# Patient Record
Sex: Female | Born: 1975
Health system: Southern US, Community
[De-identification: ages and names within clinical notes are randomized; demographics above are authoritative.]

## PROBLEM LIST (undated history)

## (undated) DIAGNOSIS — E119 Type 2 diabetes mellitus without complications: Secondary | ICD-10-CM

## (undated) DIAGNOSIS — N6320 Unspecified lump in the left breast, unspecified quadrant: Secondary | ICD-10-CM

## (undated) DIAGNOSIS — I10 Essential (primary) hypertension: Secondary | ICD-10-CM

## (undated) DIAGNOSIS — I4581 Long QT syndrome: Secondary | ICD-10-CM

## (undated) DIAGNOSIS — R42 Dizziness and giddiness: Secondary | ICD-10-CM

## (undated) DIAGNOSIS — K649 Unspecified hemorrhoids: Secondary | ICD-10-CM

## (undated) DIAGNOSIS — R002 Palpitations: Secondary | ICD-10-CM

## (undated) HISTORY — DX: Unspecified hemorrhoids: K64.9

## (undated) HISTORY — DX: Long QT syndrome: I45.81

## (undated) HISTORY — DX: Unspecified lump in the left breast, unspecified quadrant: N63.20

## (undated) HISTORY — DX: Palpitations: R00.2

---

## 2015-01-11 ENCOUNTER — Emergency Department
Admission: EM | Admit: 2015-01-11 | Discharge: 2015-01-11 | Disposition: A | Discharge status: Home / Self Care | Payer: Self-pay | Attending: Emergency Medicine | Admitting: Emergency Medicine

## 2015-01-11 ENCOUNTER — Emergency Department: Payer: Self-pay

## 2015-01-11 ENCOUNTER — Encounter: Payer: Self-pay | Admitting: *Deleted

## 2015-01-11 DIAGNOSIS — Z3202 Encounter for pregnancy test, result negative: Secondary | ICD-10-CM | POA: Insufficient documentation

## 2015-01-11 DIAGNOSIS — E119 Type 2 diabetes mellitus without complications: Secondary | ICD-10-CM | POA: Insufficient documentation

## 2015-01-11 DIAGNOSIS — R102 Pelvic and perineal pain: Secondary | ICD-10-CM

## 2015-01-11 DIAGNOSIS — N939 Abnormal uterine and vaginal bleeding, unspecified: Secondary | ICD-10-CM | POA: Insufficient documentation

## 2015-01-11 DIAGNOSIS — I10 Essential (primary) hypertension: Secondary | ICD-10-CM | POA: Insufficient documentation

## 2015-01-11 HISTORY — DX: Essential (primary) hypertension: I10

## 2015-01-11 HISTORY — DX: Type 2 diabetes mellitus without complications: E11.9

## 2015-01-11 LAB — CHLAMYDIA/NGC RT PCR (ARMC ONLY)
CHLAMYDIA TR: NOT DETECTED
N gonorrhoeae: NOT DETECTED

## 2015-01-11 LAB — CBC
HCT: 39.6 % (ref 35.0–47.0)
Hemoglobin: 13.1 g/dL (ref 12.0–16.0)
MCH: 28.4 pg (ref 26.0–34.0)
MCHC: 33.2 g/dL (ref 32.0–36.0)
MCV: 85.6 fL (ref 80.0–100.0)
PLATELETS: 369 10*3/uL (ref 150–440)
RBC: 4.62 MIL/uL (ref 3.80–5.20)
RDW: 14.3 % (ref 11.5–14.5)
WBC: 7 10*3/uL (ref 3.6–11.0)

## 2015-01-11 LAB — COMPREHENSIVE METABOLIC PANEL
ALBUMIN: 4 g/dL (ref 3.5–5.0)
ALT: 25 U/L (ref 14–54)
ANION GAP: 8 (ref 5–15)
AST: 22 U/L (ref 15–41)
Alkaline Phosphatase: 70 U/L (ref 38–126)
BUN: 9 mg/dL (ref 6–20)
CHLORIDE: 106 mmol/L (ref 101–111)
CO2: 21 mmol/L — ABNORMAL LOW (ref 22–32)
Calcium: 9 mg/dL (ref 8.9–10.3)
Creatinine, Ser: 0.77 mg/dL (ref 0.44–1.00)
GFR calc Af Amer: >60 mL/min (ref >60)
GLUCOSE: 328 mg/dL — AB (ref 65–99)
POTASSIUM: 3.5 mmol/L (ref 3.5–5.1)
Sodium: 135 mmol/L (ref 135–145)
TOTAL PROTEIN: 8.1 g/dL (ref 6.5–8.1)
Total Bilirubin: 0.3 mg/dL (ref 0.3–1.2)

## 2015-01-11 LAB — WET PREP, GENITAL
CLUE CELLS WET PREP: NONE SEEN
TRICH WET PREP: NONE SEEN
Yeast Wet Prep HPF POC: NONE SEEN

## 2015-01-11 LAB — GLUCOSE, CAPILLARY
GLUCOSE-CAPILLARY: 321 mg/dL — AB (ref 65–99)
Glucose-Capillary: 238 mg/dL — ABNORMAL HIGH (ref 65–99)

## 2015-01-11 LAB — LIPASE, BLOOD: LIPASE: 31 U/L (ref 11–51)

## 2015-01-11 LAB — HCG, QUANTITATIVE, PREGNANCY: HCG, BETA CHAIN, QUANT, S: 1 m[IU]/mL (ref <5)

## 2015-01-11 LAB — HEMOGLOBIN AND HEMATOCRIT, BLOOD
HCT: 39.1 % (ref 35.0–47.0)
HEMOGLOBIN: 13.2 g/dL (ref 12.0–16.0)

## 2015-01-11 LAB — ABO/RH: ABO/RH(D): O POS

## 2015-01-11 MED ORDER — METFORMIN HCL 500 MG PO TABS: 500.0000 mg | ORAL_TABLET | Freq: Two times a day (BID) | ORAL | Status: DC

## 2015-01-11 MED ORDER — METFORMIN HCL 500 MG PO TABS
500.0000 mg | ORAL_TABLET | ORAL | Status: AC
  Administered 2015-01-11: 500 mg via ORAL
  Filled 2015-01-11: qty 1

## 2015-01-11 MED ORDER — MORPHINE SULFATE (PF) 4 MG/ML IV SOLN
4.0000 mg | Freq: Once | INTRAVENOUS | Status: AC
  Administered 2015-01-11: 4 mg via INTRAVENOUS
  Filled 2015-01-11: qty 1

## 2015-01-11 MED ORDER — PROMETHAZINE HCL 25 MG/ML IJ SOLN
12.5000 mg | Freq: Once | INTRAMUSCULAR | Status: AC
  Administered 2015-01-11: 12.5 mg via INTRAVENOUS
  Filled 2015-01-11: qty 1

## 2015-01-11 MED ORDER — SODIUM CHLORIDE 0.9 % IV BOLUS (SEPSIS)
1000.0000 mL | Freq: Once | INTRAVENOUS | Status: AC
  Administered 2015-01-11: 1000 mL via INTRAVENOUS

## 2015-01-11 NOTE — ED Notes (Signed)
Pt reports being " a few" weeks pregnant, pt complains of left lower abdominal pain with vaginal bleeding starting yesterday

## 2015-01-11 NOTE — Discharge Instructions (Signed)
Please follow up closely with obstetrics and gynecology. Again, you're ultrasound and blood tests do not indicate pregnancy today. Return to emergency room right away if he develop a fever, severe recurrence of pain, or vomiting, have heavy bleeding, lightheadedness, weakness, or other new concerns or symptoms arise.  Please restart her metformin as prescribed, make sure the chair checking her blood sugars regularly throughout the day with each meal. He is followed closely with a primary care provider in the area to establish care for your diabetes.

## 2015-01-11 NOTE — ED Provider Notes (Signed)
[facility] Provider Note REMINDER - THIS NOTE IS NOT A FINAL MEDICAL RECORD UNTIL IT IS SIGNED. UNTIL THEN, THE CONTENT BELOW MAY REFLECT INFORMATION FROM A DOCUMENTATION TEMPLATE, NOT THE ACTUAL PATIENT VISIT. ____________________________________________  Time seen: Approximately 11:31 AM  I have reviewed the triage vital signs and the nursing notes.   HISTORY  Chief Complaint Vaginal Bleeding    HPI Tonya Paul is a 39 y.o. female who reports that her last period was the third week of August in the last month she had a positive pregnancy test. She reports [place] develop achy pain in the left lower abdomen, and this worsens throughout the last 2 days and now she still reports that yesterday she started having vaginal discharge which was bleeding. She reports she is having bleeding that was about that of a period yesterday and today now she is having increased amounts of bleeding. She denies any lightheadedness, trouble breathing, chest pain, fevers. She does have some mild nausea. She reports that she has not been on metformin for about 2 months that she has "diet-controlled" diabetes.  She states her 2 previous pregnancies both which were normal deliveries. She denies any fertility treatments. No history of ectopic.   Past Medical History  Diagnosis Date  . Diabetes mellitus without complication ([place])   . Hypertension     There are no active problems to display for this patient.   History reviewed. No pertinent past surgical history.  Current Outpatient Rx  Name  Route  Sig  Dispense  Refill  . metFORMIN (GLUCOPHAGE) 500 MG tablet   Oral   Take 1 tablet (500 mg total) by mouth 2 (two) times daily with a meal.   60 tablet   0     Allergies Aspirin  No family history on file.  Social History Social History  Substance Use Topics  . Smoking status: Never Smoker   . Smokeless tobacco:  None  . Alcohol Use: No    Review of Systems Constitutional: No fever/chills Eyes: No visual changes. ENT: No sore throat. Cardiovascular: Denies chest pain. Respiratory: Denies shortness of breath. Gastrointestinal: Left lower abdomen/pelvic pain.  no vomiting.  No diarrhea.  No constipation. Genitourinary: Negative for dysuria. Patient does report vaginal bleeding. No pain or burning with urination. Musculoskeletal: Negative for back pain. Skin: Negative for rash. Neurological: Negative for headaches, focal weakness or numbness.  10-point ROS otherwise negative.  ____________________________________________   PHYSICAL EXAM:  VITAL SIGNS: ED Triage Vitals  Enc Vitals Group     BP 01/11/15 1043 143/93 mmHg     Pulse Rate 01/11/15 1043 93     Resp 01/11/15 1043 16     Temp 01/11/15 1043 98.2 F (36.8 C)     Temp Source 01/11/15 1043 Oral     SpO2 01/11/15 1043 98 %     Weight 01/11/15 1043 200 lb (90.719 kg)     Height 01/11/15 1043 5\' 11"  (1.803 m)     Head Cir --      Peak Flow --      Pain Score 01/11/15 1046 10     Pain Loc --      Pain Edu? --      Excl. in [place]? --    Constitutional: Alert and oriented. Patient seen on the side of the bed, states she is unable to comfortable and does appear to be in moderate left-sided pain holding her  arm over her left lower back and pelvis. Eyes: Conjunctivae are normal. PERRL. EOMI. Head: Atraumatic. Nose: No congestion/rhinnorhea. Mouth/Throat: Mucous membranes are moist.  Oropharynx non-erythematous. Neck: No stridor.   Cardiovascular: Normal rate, regular rhythm. Grossly normal heart sounds.  Good peripheral circulation. Respiratory: Normal respiratory effort.  No retractions. Lungs CTAB. Gastrointestinal: Soft and nontender to prolong the left lower pelvis and left flank without rebound or guarding. No distention. No abdominal bruits. No CVA tenderness. Patient's abdomen is not obviously gravid that she is somewhat  overweight. Musculoskeletal: No lower extremity tenderness nor edema.  No joint effusions. Neurologic:  Normal speech and language. No gross focal neurologic deficits are appreciated. Skin:  Skin is warm, dry and intact. No rash noted. Psychiatric: Mood and affect are normal. Speech and behavior are normal.  ____________________________________________   LABS (all labs ordered are listed, but only abnormal results are displayed)  Labs Reviewed  WET PREP, GENITAL - Abnormal; Notable for the following:    WBC, Wet Prep HPF POC FEW (*)    All other components within normal limits  GLUCOSE, CAPILLARY - Abnormal; Notable for the following:    Glucose-Capillary 321 (*)    All other components within normal limits  COMPREHENSIVE METABOLIC PANEL - Abnormal; Notable for the following:    CO2 21 (*)    Glucose, Bld 328 (*)    All other components within normal limits  GLUCOSE, CAPILLARY - Abnormal; Notable for the following:    Glucose-Capillary 238 (*)    All other components within normal limits  CHLAMYDIA/NGC RT PCR ([facility] ONLY)  HCG, QUANTITATIVE, PREGNANCY  HEMOGLOBIN AND HEMATOCRIT, BLOOD  CBC  LIPASE, BLOOD  CBG MONITORING, ED  CBG MONITORING, ED  ABO/RH   ____________________________________________  EKG   ____________________________________________  RADIOLOGY  [place] Art/Ven Flow Abd Pelv Doppler (Final result) Result time: 01/11/15 13:31:21   Final result by Rad Results In Interface (01/11/15 13:31:21)   Narrative:   CLINICAL DATA: Acute left pelvic and flank pain, vaginal bleeding for 2 days.  EXAM: TRANSABDOMINAL AND TRANSVAGINAL ULTRASOUND OF PELVIS  DOPPLER ULTRASOUND OF OVARIES  TECHNIQUE: Both transabdominal and transvaginal ultrasound examinations of the pelvis were performed. Transabdominal technique was performed for global imaging of the pelvis including uterus, ovaries, adnexal regions, and pelvic cul-de-sac.  It was necessary to proceed  with endovaginal exam following the transabdominal exam to visualize the uterus, endometrium and adnexa in better detail. Color and duplex Doppler ultrasound was utilized to evaluate blood flow to the ovaries.  COMPARISON: None.  FINDINGS: Uterus  Measurements: 9.7 x 5.8 x 5.4 cm. No fibroids or other mass visualized.  Endometrium  Thickness: 8 mm. No focal abnormality visualized.  Right ovary  Measurements: 2.7 x 1.9 x 2.6 cm. Normal appearance/no adnexal mass.  Left ovary  Measurements: 2.6 x 1.9 x 2.1 cm. Normal appearance/no adnexal mass.  Pulsed Doppler evaluation of both ovaries demonstrates normal low-resistance arterial and venous waveforms.  Other findings  Trace pelvic free fluid in the right adnexa.  IMPRESSION: No acute or significant finding by pelvic ultrasound.   Electronically Signed By: Jerilynn Mages. Shick M.D. On: 01/11/2015 13:31          US Pelvis Complete (Final result) Result time: 01/11/15 13:31:21   Procedure changed from US OB Comp Less 14 Wks      Final result by Rad Results In Interface (01/11/15 13:31:21)   Narrative:   CLINICAL DATA: Acute left pelvic and flank pain, vaginal bleeding for 2 days.  EXAM: TRANSABDOMINAL  AND TRANSVAGINAL ULTRASOUND OF PELVIS  DOPPLER ULTRASOUND OF OVARIES  TECHNIQUE: Both transabdominal and transvaginal ultrasound examinations of the pelvis were performed. Transabdominal technique was performed for global imaging of the pelvis including uterus, ovaries, adnexal regions, and pelvic cul-de-sac.  It was necessary to proceed with endovaginal exam following the transabdominal exam to visualize the uterus, endometrium and adnexa in better detail. Color and duplex Doppler ultrasound was utilized to evaluate blood flow to the ovaries.  COMPARISON: None.  FINDINGS: Uterus  Measurements: 9.7 x 5.8 x 5.4 cm. No fibroids or other mass visualized.  Endometrium  Thickness: 8 mm. No focal  abnormality visualized.  Right ovary  Measurements: 2.7 x 1.9 x 2.6 cm. Normal appearance/no adnexal mass.  Left ovary  Measurements: 2.6 x 1.9 x 2.1 cm. Normal appearance/no adnexal mass.  Pulsed Doppler evaluation of both ovaries demonstrates normal low-resistance arterial and venous waveforms.  Other findings  Trace pelvic free fluid in the right adnexa.  IMPRESSION: No acute or significant finding by pelvic ultrasound.   Electronically Signed By: Jerilynn Mages. Shick M.D. On: 01/11/2015 13:31          US Transvaginal Non-OB (Final result) Result time: 01/11/15 13:31:21   Procedure changed from US OB Transvaginal      Final result by Rad Results In Interface (01/11/15 13:31:21)   Narrative:   CLINICAL DATA: Acute left pelvic and flank pain, vaginal bleeding for 2 days.  EXAM: TRANSABDOMINAL AND TRANSVAGINAL ULTRASOUND OF PELVIS  DOPPLER ULTRASOUND OF OVARIES  TECHNIQUE: Both transabdominal and transvaginal ultrasound examinations of the pelvis were performed. Transabdominal technique was performed for global imaging of the pelvis including uterus, ovaries, adnexal regions, and pelvic cul-de-sac.  It was necessary to proceed with endovaginal exam following the transabdominal exam to visualize the uterus, endometrium and adnexa in better detail. Color and duplex Doppler ultrasound was utilized to evaluate blood flow to the ovaries.  COMPARISON: None.  FINDINGS: Uterus  Measurements: 9.7 x 5.8 x 5.4 cm. No fibroids or other mass visualized.  Endometrium  Thickness: 8 mm. No focal abnormality visualized.  Right ovary  Measurements: 2.7 x 1.9 x 2.6 cm. Normal appearance/no adnexal mass.  Left ovary  Measurements: 2.6 x 1.9 x 2.1 cm. Normal appearance/no adnexal mass.  Pulsed Doppler evaluation of both ovaries demonstrates normal low-resistance arterial and venous waveforms.  Other findings  Trace pelvic free fluid in the right  adnexa.  IMPRESSION: No acute or significant finding by pelvic ultrasound.      ____________________________________________   PROCEDURES  Procedure(s) performed: None  Critical Care performed: No  ____________________________________________   INITIAL IMPRESSION / ASSESSMENT AND PLAN / ED COURSE  Pertinent labs & imaging results that were available during my care of the patient were reviewed by me and considered in my medical decision making (see chart for details).  Patient presents with vaginal bleeding and 3 days of left lower flank/pelvic pain. She reports she is pregnant. My primary concern given the amount of pain and bleeding that she is and would be ruling out ectopic, however other considerations are considered such as kidney stone, miscarriage, and for less likely would be left-sided appendicitis, other acute intra-abdominal surgical pathology or infection. She has no fever and she denies any infectious symptoms, primary concern is left-sided pelvic pain and bleeding vaginally. She does report she had a kidney stone which is about 39 years old, and this could also cause similar discomfort but we will initially start by ruling out ectopic. Transvaginal ultrasound. Discussed the risks  and benefits of pain medications and antiemetics with morphine and Phenergan, given the patient's significant discomfort and she does consent to their use after discussion I think this is very agreeable.  ----------------------------------------- 12:26 PM on 01/11/2015 -----------------------------------------  Patient resting much more comfortably, currently her pain is only a 1 out of 10 still located in the left lower quadrant. Her hCG is less than 5 making current pregnancy extremity unlikely, and I suspect there is a possibility she may miscarriage or potentially has retained product or less likely an ectopic. We will continue with pelvic ultrasound. Clinically she appears much  improved.  ----------------------------------------- 2:08 PM on 01/11/2015 -----------------------------------------  Reviewed ultrasound, no acute. Patient resting comfortably but does still have minor focal tenderness the left lower quadrant. We'll perform a pelvic exam, discussed with the patient is agreeable. ----------------------------------------- 2:19 PM on 01/11/2015 -----------------------------------------  Pelvic exam performed with tech Di Kindle. The patient does have small amount of clots in the vaginal vault, very minimal tenderness along the left adnexa without any cervical motion tenderness. No vaginal discharge or purulence. No abnormal odor. There is no obvious mass. The cervix is closed.  Case and care discussed with Dr. Etta Grandchild of OB/GYN and she advises close outpatient follow-up, the based on ultrasound and hCG testing it would appear that the patient is likely returning to her menstrual cycle or potentially had a chemical pregnancy or has a completed miscarriage. I discussed this with the patient, we will have her follow-up closely with OB. At this point there is nothing indicates she is pregnant, I will discharge her to home and provide her prescription to return back on her metformin. Patient very agreeable. We did discuss that a CT could be performed to further evaluate, but given the vaginal bleeding and her chief complaint. This likely GYN in nature. Currently no peritonitis or evidence to suggest the need for CT imaging. Should she develop severe abdominal pain, fever, vomiting, weakness, or severe bleeding then she will return to emergency room right away. ____________________________________________   FINAL CLINICAL IMPRESSION(S) / ED DIAGNOSES  Final diagnoses:  Vagina bleeding      Delman Kitten, MD 01/11/15 1450

## 2015-03-20 ENCOUNTER — Emergency Department
Admission: EM | Admit: 2015-03-20 | Discharge: 2015-03-20 | Disposition: A | Discharge status: Home / Self Care | Payer: Self-pay | Attending: Emergency Medicine | Admitting: Emergency Medicine

## 2015-03-20 DIAGNOSIS — Z3202 Encounter for pregnancy test, result negative: Secondary | ICD-10-CM | POA: Insufficient documentation

## 2015-03-20 DIAGNOSIS — R739 Hyperglycemia, unspecified: Secondary | ICD-10-CM

## 2015-03-20 DIAGNOSIS — I10 Essential (primary) hypertension: Secondary | ICD-10-CM | POA: Insufficient documentation

## 2015-03-20 DIAGNOSIS — N390 Urinary tract infection, site not specified: Secondary | ICD-10-CM

## 2015-03-20 DIAGNOSIS — Z760 Encounter for issue of repeat prescription: Secondary | ICD-10-CM | POA: Insufficient documentation

## 2015-03-20 DIAGNOSIS — E1165 Type 2 diabetes mellitus with hyperglycemia: Secondary | ICD-10-CM | POA: Insufficient documentation

## 2015-03-20 LAB — BASIC METABOLIC PANEL
ANION GAP: 6 (ref 5–15)
BUN: 12 mg/dL (ref 6–20)
CHLORIDE: 106 mmol/L (ref 101–111)
CO2: 24 mmol/L (ref 22–32)
Calcium: 9.3 mg/dL (ref 8.9–10.3)
Creatinine, Ser: 0.73 mg/dL (ref 0.44–1.00)
GFR calc Af Amer: >60 mL/min (ref >60)
GFR calc non Af Amer: >60 mL/min (ref >60)
Glucose, Bld: 187 mg/dL — ABNORMAL HIGH (ref 65–99)
POTASSIUM: 3.8 mmol/L (ref 3.5–5.1)
SODIUM: 136 mmol/L (ref 135–145)

## 2015-03-20 LAB — CBC
HEMATOCRIT: 38.1 % (ref 35.0–47.0)
HEMOGLOBIN: 12.6 g/dL (ref 12.0–16.0)
MCH: 28.6 pg (ref 26.0–34.0)
MCHC: 33.1 g/dL (ref 32.0–36.0)
MCV: 86.4 fL (ref 80.0–100.0)
Platelets: 344 10*3/uL (ref 150–440)
RBC: 4.41 MIL/uL (ref 3.80–5.20)
RDW: 13.8 % (ref 11.5–14.5)
WBC: 7.4 10*3/uL (ref 3.6–11.0)

## 2015-03-20 LAB — URINALYSIS COMPLETE WITH MICROSCOPIC (ARMC ONLY)
Bilirubin Urine: NEGATIVE
Glucose, UA: NEGATIVE mg/dL
HGB URINE DIPSTICK: NEGATIVE
Ketones, ur: NEGATIVE mg/dL
NITRITE: NEGATIVE
PH: 6 (ref 5.0–8.0)
PROTEIN: NEGATIVE mg/dL
SPECIFIC GRAVITY, URINE: 1.017 (ref 1.005–1.030)

## 2015-03-20 LAB — GLUCOSE, CAPILLARY: Glucose-Capillary: 164 mg/dL — ABNORMAL HIGH (ref 65–99)

## 2015-03-20 LAB — POCT PREGNANCY, URINE: Preg Test, Ur: NEGATIVE

## 2015-03-20 MED ORDER — CIPROFLOXACIN HCL 500 MG PO TABS: 500.0000 mg | ORAL_TABLET | Freq: Two times a day (BID) | ORAL | Status: AC

## 2015-03-20 MED ORDER — METFORMIN HCL 500 MG PO TABS: 500.0000 mg | ORAL_TABLET | Freq: Two times a day (BID) | ORAL | Status: DC

## 2015-03-20 NOTE — Discharge Instructions (Signed)
Hyperglycemia °Hyperglycemia occurs when the glucose (sugar) in your blood is too high. Hyperglycemia can happen for many reasons, but it most often happens to people who do not know they have diabetes or are not managing their diabetes properly.  °CAUSES  °Whether you have diabetes or not, there are other causes of hyperglycemia. Hyperglycemia can occur when you have diabetes, but it can also occur in other situations that you might not be as aware of, such as: °Diabetes °· If you have diabetes and are having problems controlling your blood glucose, hyperglycemia could occur because of some of the following reasons: °· Not following your meal plan. °· Not taking your diabetes medications or not taking it properly. °· Exercising less or doing less activity than you normally do. °· Being sick. °Pre-diabetes °· This cannot be ignored. Before people develop Type 2 diabetes, they almost always have "pre-diabetes." This is when your blood glucose levels are higher than normal, but not yet high enough to be diagnosed as diabetes. Research has shown that some long-term damage to the body, especially the heart and circulatory system, may already be occurring during pre-diabetes. If you take action to manage your blood glucose when you have pre-diabetes, you may delay or prevent Type 2 diabetes from developing. °Stress °· If you have diabetes, you may be "diet" controlled or on oral medications or insulin to control your diabetes. However, you may find that your blood glucose is higher than usual in the hospital whether you have diabetes or not. This is often referred to as "stress hyperglycemia." Stress can elevate your blood glucose. This happens because of hormones put out by the body during times of stress. If stress has been the cause of your high blood glucose, it can be followed regularly by your caregiver. That way he/she can make sure your hyperglycemia does not continue to get worse or progress to  diabetes. °Steroids °· Steroids are medications that act on the infection fighting system (immune system) to block inflammation or infection. One side effect can be a rise in blood glucose. Most people can produce enough extra insulin to allow for this rise, but for those who cannot, steroids make blood glucose levels go even higher. It is not unusual for steroid treatments to "uncover" diabetes that is developing. It is not always possible to determine if the hyperglycemia will go away after the steroids are stopped. A special blood test called an A1c is sometimes done to determine if your blood glucose was elevated before the steroids were started. °SYMPTOMS °· Thirsty. °· Frequent urination. °· Dry mouth. °· Blurred vision. °· Tired or fatigue. °· Weakness. °· Sleepy. °· Tingling in feet or leg. °DIAGNOSIS  °Diagnosis is made by monitoring blood glucose in one or all of the following ways: °· A1c test. This is a chemical found in your blood. °· Fingerstick blood glucose monitoring. °· Laboratory results. °TREATMENT  °First, knowing the cause of the hyperglycemia is important before the hyperglycemia can be treated. Treatment may include, but is not be limited to: °· Education. °· Change or adjustment in medications. °· Change or adjustment in meal plan. °· Treatment for an illness, infection, etc. °· More frequent blood glucose monitoring. °· Change in exercise plan. °· Decreasing or stopping steroids. °· Lifestyle changes. °HOME CARE INSTRUCTIONS  °· Test your blood glucose as directed. °· Exercise regularly. Your caregiver will give you instructions about exercise. Pre-diabetes or diabetes which comes on with stress is helped by exercising. °· Eat wholesome,   balanced meals. Eat often and at regular, fixed times. Your caregiver or nutritionist will give you a meal plan to guide your sugar intake.  Being at an ideal weight is important. If needed, losing as little as 10 to 15 pounds may help improve blood  glucose levels. SEEK MEDICAL CARE IF:   You have questions about medicine, activity, or diet.  You continue to have symptoms (problems such as increased thirst, urination, or weight gain). SEEK IMMEDIATE MEDICAL CARE IF:   You are vomiting or have diarrhea.  Your breath smells fruity.  You are breathing faster or slower.  You are very sleepy or incoherent.  You have numbness, tingling, or pain in your feet or hands.  You have chest pain.  Your symptoms get worse even though you have been following your caregiver's orders.  If you have any other questions or concerns.   This information is not intended to replace advice given to you by your health care provider. Make sure you discuss any questions you have with your health care provider.   Document Released: 09/01/2000 Document Revised: 05/31/2011 Document Reviewed: 11/12/2014 Elsevier Interactive Patient Education 2016 [employer]  Urinary Tract Infection A urinary tract infection (UTI) can occur any place along the urinary tract. The tract includes the kidneys, ureters, bladder, and urethra. A type of germ called bacteria often causes a UTI. UTIs are often helped with antibiotic medicine.  HOME CARE   If given, take antibiotics as told by your doctor. Finish them even if you start to feel better.  Drink enough fluids to keep your pee (urine) clear or pale yellow.  Avoid tea, drinks with caffeine, and bubbly (carbonated) drinks.  Pee often. Avoid holding your pee in for a long time.  Pee before and after having sex (intercourse).  Wipe from front to back after you poop (bowel movement) if you are a woman. Use each tissue only once. GET HELP RIGHT AWAY IF:   You have back pain.  You have lower belly (abdominal) pain.  You have chills.  You feel sick to your stomach (nauseous).  You throw up (vomit).  Your burning or discomfort with peeing does not go away.  You have a fever.  Your symptoms are not better  in 3 days. MAKE SURE YOU:   Understand these instructions.  Will watch your condition.  Will get help right away if you are not doing well or get worse.   This information is not intended to replace advice given to you by your health care provider. Make sure you discuss any questions you have with your health care provider.   Document Released: 08/25/2007 Document Revised: 03/29/2014 Document Reviewed: 10/07/2011 Elsevier Interactive Patient Education [employer].

## 2015-03-20 NOTE — ED Notes (Signed)
Says she has recently moved here and is out of metformin.  Has been controlling with diet. Says she checks her sugar and it has been up this week.  Pt given information on [facility] and [facility].

## 2015-03-20 NOTE — ED Notes (Signed)
Pt states she has been out of her metformin for the past month and her glucose has been in the 300's the past couple of days.

## 2015-03-20 NOTE — ED Provider Notes (Signed)
[facility] Emergency Department Provider Note  Time seen: 1:59 PM  I have reviewed the triage vital signs and the nursing notes.   HISTORY  Chief Complaint Hyperglycemia    HPI Tonya Paul is a 39 y.o. female with a past medical history of hypertension and diabetes who presents the emergency department for an elevated blood glucose. According to the patient she has been out of her metformin for 3 weeks after recently moving to the area. She has no primary care doctor established yet and no way to obtain a refill of her medications. She took her blood glucose of the last few days of this run in the 300s so she came to the emergency Department today looking for help. Denies any abdominal pain, nausea, vomiting, diarrhea. She did note some cloudy urine and upon further questioning she states she might have had some mild dysuria. Describes her dysuria is very mild.     Past Medical History  Diagnosis Date  . Diabetes mellitus without complication ([place])   . Hypertension     There are no active problems to display for this patient.   History reviewed. No pertinent past surgical history.  Current Outpatient Rx  Name  Route  Sig  Dispense  Refill  . metFORMIN (GLUCOPHAGE) 500 MG tablet   Oral   Take 1 tablet (500 mg total) by mouth 2 (two) times daily with a meal.   60 tablet   0     Allergies Aspirin  No family history on file.  Social History Social History  Substance Use Topics  . Smoking status: Never Smoker   . Smokeless tobacco: None  . Alcohol Use: No    Review of Systems Constitutional: Negative for fever Cardiovascular: Negative for chest pain. Respiratory: Negative for shortness of breath. Gastrointestinal: Negative for abdominal pain, vomiting and diarrhea. Genitourinary: Mild dysuria Musculoskeletal: Negative for back pain. Neurological: Negative for headache 10-point ROS otherwise  negative.  ____________________________________________   PHYSICAL EXAM:  VITAL SIGNS: ED Triage Vitals  Enc Vitals Group     BP 03/20/15 1157 149/85 mmHg     Pulse Rate 03/20/15 1157 94     Resp 03/20/15 1157 18     Temp 03/20/15 1157 98.2 F (36.8 C)     Temp Source 03/20/15 1157 Oral     SpO2 03/20/15 1157 99 %     Weight 03/20/15 1157 185 lb (83.915 kg)     Height 03/20/15 1157 6' (1.829 m)     Head Cir --      Peak Flow --      Pain Score --      Pain Loc --      Pain Edu? --      Excl. in [place]? --     Constitutional: Alert and oriented. Well appearing and in no distress. Eyes: Normal exam ENT   Head: Normocephalic and atraumatic   Mouth/Throat: Mucous membranes are moist. Cardiovascular: Normal rate, regular rhythm. No murmur Respiratory: Normal respiratory effort without tachypnea nor retractions. Breath sounds are clear Gastrointestinal: Soft and nontender. No distention.   Musculoskeletal: Nontender with normal range of motion in all extremities.  Neurologic:  Normal speech and language. No gross focal neurologic deficits  Skin:  Skin is warm, dry and intact.  Psychiatric: Mood and affect are normal. Speech and behavior are normal  ____________________________________________     INITIAL IMPRESSION / ASSESSMENT AND PLAN / ED COURSE  Pertinent labs & imaging results that were available  during my care of the patient were reviewed by me and considered in my medical decision making (see chart for details).  Patient presents to the emergency department with elevated blood glucose after being out of her metformin for the past 3 weeks. Patient typically takes 1000 mg in the morning and in the evening. She is also complaining of some cloudy urine, with mild dysuria. Urinalysis shows 6-30 white blood cells in her urine, we will treat with ciprofloxacin. I will prescribe the patient's metformin, and her labs today are largely within normal limits with a blood  glucose of 180. We have provided the patient information for the outpatient clinic resources in the area, the patient is agreeable and currently waiting for her insurance to kick in.  ____________________________________________   FINAL CLINICAL IMPRESSION(S) / ED DIAGNOSES  Urinary tract infection Hyperglycemia Medication refill   Harvest Dark, MD 03/20/15 1402

## 2015-04-08 ENCOUNTER — Ambulatory Visit: Payer: Self-pay

## 2015-04-08 ENCOUNTER — Ambulatory Visit: Payer: Self-pay | Admitting: Family Medicine

## 2015-04-16 ENCOUNTER — Ambulatory Visit: Payer: Self-pay | Attending: Family Medicine | Admitting: Family Medicine

## 2015-04-16 ENCOUNTER — Encounter: Payer: Self-pay | Admitting: Family Medicine

## 2015-04-16 VITALS — BP 140/95 | HR 90 | Temp 98.8°F | Resp 16 | Ht 72.0 in | Wt 206.0 lb

## 2015-04-16 DIAGNOSIS — Z794 Long term (current) use of insulin: Secondary | ICD-10-CM | POA: Insufficient documentation

## 2015-04-16 DIAGNOSIS — E1165 Type 2 diabetes mellitus with hyperglycemia: Secondary | ICD-10-CM | POA: Insufficient documentation

## 2015-04-16 DIAGNOSIS — I10 Essential (primary) hypertension: Secondary | ICD-10-CM | POA: Insufficient documentation

## 2015-04-16 DIAGNOSIS — Z7984 Long term (current) use of oral hypoglycemic drugs: Secondary | ICD-10-CM | POA: Insufficient documentation

## 2015-04-16 LAB — GLUCOSE, POCT (MANUAL RESULT ENTRY): POC GLUCOSE: 100 mg/dL — AB (ref 70–99)

## 2015-04-16 LAB — POCT GLYCOSYLATED HEMOGLOBIN (HGB A1C): Hemoglobin A1C: 7.5

## 2015-04-16 MED ORDER — TRUE METRIX METER DEVI: 1.0000 | Freq: Three times a day (TID) | Status: DC

## 2015-04-16 MED ORDER — TRUEPLUS LANCETS 28G MISC: 1.0000 | Freq: Three times a day (TID) | Status: DC

## 2015-04-16 MED ORDER — LISINOPRIL 5 MG PO TABS: 5.0000 mg | ORAL_TABLET | Freq: Every day | ORAL | Status: DC

## 2015-04-16 MED ORDER — GLUCOSE BLOOD VI STRP: ORAL_STRIP | Status: DC

## 2015-04-16 MED FILL — LISINOPRIL 5 MG TABLET: 5 | 30 days supply | Qty: 30 | Fill #0

## 2015-04-16 MED FILL — TRUE METRIX TEST STRIP: 33 days supply | Qty: 100 | Fill #0

## 2015-04-16 MED FILL — TRUEplus LANCETS 28G MISC: 33 days supply | Qty: 100 | Fill #0

## 2015-04-16 MED FILL — !TRUE METRIX BLOOD GLUCOSE: 365 days supply | Qty: 1 | Fill #0

## 2015-04-16 NOTE — Progress Notes (Signed)
Subjective:  Patient ID: Tonya Paul, female    DOB: 1975-04-29  Age: 40 y.o. MRN: [id number]  CC: Diabetes   HPI Tonya Paul is a 40 year old with a history of type 2 diabetes mellitus (A1c 7.5) who was diagnosed with diabetes mellitus in 2014 and had been on metformin intermittently and diet controlled until she presented to the ED at [facility] with a blood sugar and 300s for which her metformin was restarted. She was also treated for UTI with ciprofloxacin but she states after taking one tablet she developed pruritus and so she stopped it; urinary symptoms have since resolved.  She has been taking metformin once daily rather than twice daily because she has had episodes of hypoglycemia which sugars dropping into the 60s. She has no other complaints today  Outpatient Prescriptions Prior to Visit  Medication Sig Dispense Refill  . metFORMIN (GLUCOPHAGE) 500 MG tablet Take 1 tablet (500 mg total) by mouth 2 (two) times daily with a meal. 60 tablet 1   No facility-administered medications prior to visit.    ROS Review of Systems  Constitutional: Negative for activity change and appetite change.  HENT: Negative for sinus pressure and sore throat.   Respiratory: Negative for chest tightness, shortness of breath and wheezing.   Cardiovascular: Negative for chest pain and palpitations.  Gastrointestinal: Negative for abdominal pain, constipation and abdominal distention.  Genitourinary: Negative.   Musculoskeletal: Negative.   Psychiatric/Behavioral: Negative for behavioral problems and dysphoric mood.    Objective:  BP 140/95 mmHg  Pulse 90  Temp(Src) 98.8 F (37.1 C)  Resp 16  Ht 6' (1.829 m)  Wt 206 lb (93.441 kg)  BMI 27.93 kg/m2  SpO2 98%  LMP 03/10/2015  BP/Weight 04/16/2015 03/20/2015 [id number]  Systolic BP [id number] [id number] [id number]  Diastolic BP 95 85 96  Wt. (Lbs) 206 185 200  BMI 27.93 25.08 27.91    Lab Results  Component Value Date   HGBA1C  7.50 04/16/2015     Physical Exam  Constitutional: She is oriented to person, place, and time. She appears well-developed and well-nourished.  Cardiovascular: Normal rate, normal heart sounds and intact distal pulses.   No murmur heard. Pulmonary/Chest: Effort normal and breath sounds normal. She has no wheezes. She has no rales. She exhibits no tenderness.  Abdominal: Soft. Bowel sounds are normal. She exhibits no distension and no mass. There is no tenderness.  Musculoskeletal: Normal range of motion.  Neurological: She is alert and oriented to person, place, and time.     Assessment & Plan:   1. Type 2 diabetes mellitus with hyperglycemia, without long-term current use of insulin (HCC)  A1c of 7.5, CBG of 100  takes metformin daily due to hypoglycemia.  we'll review blood sugar log that her next office visit.  foot exam, Pneumovax will be performed at her next visit and she has been advised to schedule annual eye exams with an optometrist or ophthalmologist. - Glucose (CBG) - HgB A1c - COMPLETE METABOLIC PANEL WITH GFR; Future - Lipid panel; Future - glucose blood (TRUE METRIX BLOOD GLUCOSE TEST) test strip; Used 3 times daily as directed before meals  Dispense: 100 each; Refill: 12 - Blood Glucose Monitoring Suppl (TRUE METRIX METER) DEVI; 1 each by Does not apply route 3 (three) times daily before meals.  Dispense: 1 Device; Refill: 0 - TRUEPLUS LANCETS 28G MISC; 1 each by Does not apply route 3 (three) times daily before meals.  Dispense: 100 each; Refill: 12 -  Microalbumin/Creatinine Ratio, Urine; Future  2. Essential hypertension  diastolic elevation. Commenced on lisinopril. - lisinopril (PRINIVIL,ZESTRIL) 5 MG tablet; Take 1 tablet (5 mg total) by mouth daily.  Dispense: 90 tablet; Refill: 3   Meds ordered this encounter  Medications  . glucose blood (TRUE METRIX BLOOD GLUCOSE TEST) test strip    Sig: Used 3 times daily as directed before meals    Dispense:  100  each    Refill:  12  . Blood Glucose Monitoring Suppl (TRUE METRIX METER) DEVI    Sig: 1 each by Does not apply route 3 (three) times daily before meals.    Dispense:  1 Device    Refill:  0  . TRUEPLUS LANCETS 28G MISC    Sig: 1 each by Does not apply route 3 (three) times daily before meals.    Dispense:  100 each    Refill:  12  . lisinopril (PRINIVIL,ZESTRIL) 5 MG tablet    Sig: Take 1 tablet (5 mg total) by mouth daily.    Dispense:  90 tablet    Refill:  3    Follow-up: Return in about 3 weeks (around 05/07/2015) for  follow-up of diabetes mellitus..   This note has been created with Surveyor, quantity. Any transcriptional errors are unintentional.    Arnoldo Morale MD

## 2015-04-16 NOTE — Patient Instructions (Signed)
Diabetes Mellitus and Food It is important for you to manage your blood sugar (glucose) level. Your blood glucose level can be greatly affected by what you eat. Eating healthier foods in the appropriate amounts throughout the day at about the same time each day will help you control your blood glucose level. It can also help slow or prevent worsening of your diabetes mellitus. Healthy eating may even help you improve the level of your blood pressure and reach or maintain a healthy weight.  General recommendations for healthful eating and cooking habits include:  Eating meals and snacks regularly. Avoid going long periods of time without eating to lose weight.  Eating a diet that consists mainly of plant-based foods, such as fruits, vegetables, nuts, legumes, and whole grains.  Using low-heat cooking methods, such as baking, instead of high-heat cooking methods, such as deep frying. Work with your dietitian to make sure you understand how to use the Nutrition Facts information on food labels. HOW CAN FOOD AFFECT ME? Carbohydrates Carbohydrates affect your blood glucose level more than any other type of food. Your dietitian will help you determine how many carbohydrates to eat at each meal and teach you how to count carbohydrates. Counting carbohydrates is important to keep your blood glucose at a healthy level, especially if you are using insulin or taking certain medicines for diabetes mellitus. Alcohol Alcohol can cause sudden decreases in blood glucose (hypoglycemia), especially if you use insulin or take certain medicines for diabetes mellitus. Hypoglycemia can be a life-threatening condition. Symptoms of hypoglycemia (sleepiness, dizziness, and disorientation) are similar to symptoms of having too much alcohol.  If your health care provider has given you approval to drink alcohol, do so in moderation and use the following guidelines:  Women should not have more than one drink per day, and men  should not have more than two drinks per day. One drink is equal to:  12 oz of beer.  5 oz of wine.  1 oz of hard liquor.  Do not drink on an empty stomach.  Keep yourself hydrated. Have water, diet soda, or unsweetened iced tea.  Regular soda, juice, and other mixers might contain a lot of carbohydrates and should be counted. WHAT FOODS ARE NOT RECOMMENDED? As you make food choices, it is important to remember that all foods are not the same. Some foods have fewer nutrients per serving than other foods, even though they might have the same number of calories or carbohydrates. It is difficult to get your body what it needs when you eat foods with fewer nutrients. Examples of foods that you should avoid that are high in calories and carbohydrates but low in nutrients include:  Trans fats (most processed foods list trans fats on the Nutrition Facts label).  Regular soda.  Juice.  Candy.  Sweets, such as cake, pie, doughnuts, and cookies.  Fried foods. WHAT FOODS CAN I EAT? Eat nutrient-rich foods, which will nourish your body and keep you healthy. The food you should eat also will depend on several factors, including:  The calories you need.  The medicines you take.  Your weight.  Your blood glucose level.  Your blood pressure level.  Your cholesterol level. You should eat a variety of foods, including:  Protein.  Lean cuts of meat.  Proteins low in saturated fats, such as fish, egg whites, and beans. Avoid processed meats.  Fruits and vegetables.  Fruits and vegetables that may help control blood glucose levels, such as apples, mangoes, and   yams.  Dairy products.  Choose fat-free or low-fat dairy products, such as milk, yogurt, and cheese.  Grains, bread, pasta, and rice.  Choose whole grain products, such as multigrain bread, whole oats, and brown rice. These foods may help control blood pressure.  Fats.  Foods containing healthful fats, such as nuts,  avocado, olive oil, canola oil, and fish. DOES EVERYONE WITH DIABETES MELLITUS HAVE THE SAME MEAL PLAN? Because every person with diabetes mellitus is different, there is not one meal plan that works for everyone. It is very important that you meet with a dietitian who will help you create a meal plan that is just right for you.   This information is not intended to replace advice given to you by your health care provider. Make sure you discuss any questions you have with your health care provider.   Document Released: 12/03/2004 Document Revised: 03/29/2014 Document Reviewed: 02/02/2013 Elsevier Interactive Patient Education 2016 Elsevier Inc.  

## 2015-04-16 NOTE — Progress Notes (Signed)
Patient has had DM2 since 2014 Recently moved from [place] and needs financial application Recently in ED for hyperglycemia where they started her on metformin bid-she takes it only in the am because bid makes her hypoglycemic

## 2015-04-18 ENCOUNTER — Ambulatory Visit: Payer: Self-pay | Attending: Family Medicine

## 2015-04-18 DIAGNOSIS — E1165 Type 2 diabetes mellitus with hyperglycemia: Secondary | ICD-10-CM

## 2015-04-18 LAB — COMPLETE METABOLIC PANEL WITH GFR
ALBUMIN: 4.2 g/dL (ref 3.6–5.1)
ALK PHOS: 46 U/L (ref 33–115)
ALT: 15 U/L (ref 6–29)
AST: 15 U/L (ref 10–30)
BILIRUBIN TOTAL: 0.4 mg/dL (ref 0.2–1.2)
BUN: 10 mg/dL (ref 7–25)
CALCIUM: 9.2 mg/dL (ref 8.6–10.2)
CO2: 27 mmol/L (ref 20–31)
Chloride: 105 mmol/L (ref 98–110)
Creat: 0.84 mg/dL (ref 0.50–1.10)
GFR, Est African American: >89 mL/min (ref >=60)
GFR, Est Non African American: 88 mL/min (ref >=60)
GLUCOSE: 125 mg/dL — AB (ref 65–99)
POTASSIUM: 4.7 mmol/L (ref 3.5–5.3)
SODIUM: 140 mmol/L (ref 135–146)
TOTAL PROTEIN: 7.2 g/dL (ref 6.1–8.1)

## 2015-04-18 LAB — LIPID PANEL
CHOL/HDL RATIO: 3.6 ratio (ref <=5.0)
CHOLESTEROL: 143 mg/dL (ref 125–200)
HDL: 40 mg/dL — ABNORMAL LOW (ref >=46)
LDL Cholesterol: 90 mg/dL (ref <130)
Triglycerides: 67 mg/dL (ref <150)
VLDL: 13 mg/dL (ref <30)

## 2015-04-19 LAB — MICROALBUMIN / CREATININE URINE RATIO
CREATININE, URINE: 193 mg/dL (ref 20–320)
MICROALB UR: 2.1 mg/dL
MICROALB/CREAT RATIO: 11 ug/mg{creat} (ref <30)

## 2015-04-23 ENCOUNTER — Telehealth: Payer: Self-pay

## 2015-04-23 NOTE — Telephone Encounter (Signed)
Nurse called patient, patient verified date of birth. Patient aware of normal labs. Patient voices understanding and has no further questions at this time.  

## 2015-04-23 NOTE — Telephone Encounter (Signed)
-----   Message from Arnoldo Morale, MD sent at 04/21/2015  8:30 AM EST ----- Please inform the patient that labs are normal. Thank you.

## 2015-05-06 ENCOUNTER — Ambulatory Visit: Payer: Self-pay | Admitting: Pharmacist

## 2015-05-09 ENCOUNTER — Ambulatory Visit: Payer: Self-pay | Attending: Family Medicine | Admitting: Family Medicine

## 2015-05-09 ENCOUNTER — Encounter: Payer: Self-pay | Admitting: Family Medicine

## 2015-05-09 VITALS — BP 123/82 | HR 95 | Temp 98.5°F | Resp 15 | Ht 72.0 in | Wt 204.0 lb

## 2015-05-09 DIAGNOSIS — E1165 Type 2 diabetes mellitus with hyperglycemia: Secondary | ICD-10-CM

## 2015-05-09 DIAGNOSIS — Z79899 Other long term (current) drug therapy: Secondary | ICD-10-CM | POA: Insufficient documentation

## 2015-05-09 DIAGNOSIS — L603 Nail dystrophy: Secondary | ICD-10-CM | POA: Insufficient documentation

## 2015-05-09 DIAGNOSIS — Z803 Family history of malignant neoplasm of breast: Secondary | ICD-10-CM | POA: Insufficient documentation

## 2015-05-09 DIAGNOSIS — Z7984 Long term (current) use of oral hypoglycemic drugs: Secondary | ICD-10-CM | POA: Insufficient documentation

## 2015-05-09 DIAGNOSIS — N644 Mastodynia: Secondary | ICD-10-CM

## 2015-05-09 LAB — GLUCOSE, POCT (MANUAL RESULT ENTRY): POC Glucose: 110 mg/dl — AB (ref 70–99)

## 2015-05-09 MED ORDER — METFORMIN HCL 500 MG PO TABS
500.0000 mg | ORAL_TABLET | Freq: Two times a day (BID) | ORAL | Status: DC
Start: 2015-05-09 — End: 2015-09-01

## 2015-05-09 MED FILL — LISINOPRIL 5 MG TABLET: 5 | 30 days supply | Qty: 30 | Fill #1

## 2015-05-09 MED FILL — metFORMIN HCL 500 MG TABS: 500 | 30 days supply | Qty: 60 | Fill #0
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
## 2015-05-09 NOTE — Progress Notes (Signed)
Patient been monitoring her sugars and blood pressure at home She needs refill on metformin

## 2015-05-09 NOTE — Patient Instructions (Signed)
Diabetes Mellitus and Food It is important for you to manage your blood sugar (glucose) level. Your blood glucose level can be greatly affected by what you eat. Eating healthier foods in the appropriate amounts throughout the day at about the same time each day will help you control your blood glucose level. It can also help slow or prevent worsening of your diabetes mellitus. Healthy eating may even help you improve the level of your blood pressure and reach or maintain a healthy weight.  General recommendations for healthful eating and cooking habits include:  Eating meals and snacks regularly. Avoid going long periods of time without eating to lose weight.  Eating a diet that consists mainly of plant-based foods, such as fruits, vegetables, nuts, legumes, and whole grains.  Using low-heat cooking methods, such as baking, instead of high-heat cooking methods, such as deep frying. Work with your dietitian to make sure you understand how to use the Nutrition Facts information on food labels. HOW CAN FOOD AFFECT ME? Carbohydrates Carbohydrates affect your blood glucose level more than any other type of food. Your dietitian will help you determine how many carbohydrates to eat at each meal and teach you how to count carbohydrates. Counting carbohydrates is important to keep your blood glucose at a healthy level, especially if you are using insulin or taking certain medicines for diabetes mellitus. Alcohol Alcohol can cause sudden decreases in blood glucose (hypoglycemia), especially if you use insulin or take certain medicines for diabetes mellitus. Hypoglycemia can be a life-threatening condition. Symptoms of hypoglycemia (sleepiness, dizziness, and disorientation) are similar to symptoms of having too much alcohol.  If your health care provider has given you approval to drink alcohol, do so in moderation and use the following guidelines:  Women should not have more than one drink per day, and men  should not have more than two drinks per day. One drink is equal to:  12 oz of beer.  5 oz of wine.  1 oz of hard liquor.  Do not drink on an empty stomach.  Keep yourself hydrated. Have water, diet soda, or unsweetened iced tea.  Regular soda, juice, and other mixers might contain a lot of carbohydrates and should be counted. WHAT FOODS ARE NOT RECOMMENDED? As you make food choices, it is important to remember that all foods are not the same. Some foods have fewer nutrients per serving than other foods, even though they might have the same number of calories or carbohydrates. It is difficult to get your body what it needs when you eat foods with fewer nutrients. Examples of foods that you should avoid that are high in calories and carbohydrates but low in nutrients include:  Trans fats (most processed foods list trans fats on the Nutrition Facts label).  Regular soda.  Juice.  Candy.  Sweets, such as cake, pie, doughnuts, and cookies.  Fried foods. WHAT FOODS CAN I EAT? Eat nutrient-rich foods, which will nourish your body and keep you healthy. The food you should eat also will depend on several factors, including:  The calories you need.  The medicines you take.  Your weight.  Your blood glucose level.  Your blood pressure level.  Your cholesterol level. You should eat a variety of foods, including:  Protein.  Lean cuts of meat.  Proteins low in saturated fats, such as fish, egg whites, and beans. Avoid processed meats.  Fruits and vegetables.  Fruits and vegetables that may help control blood glucose levels, such as apples, mangoes, and   yams.  Dairy products.  Choose fat-free or low-fat dairy products, such as milk, yogurt, and cheese.  Grains, bread, pasta, and rice.  Choose whole grain products, such as multigrain bread, whole oats, and brown rice. These foods may help control blood pressure.  Fats.  Foods containing healthful fats, such as nuts,  avocado, olive oil, canola oil, and fish. DOES EVERYONE WITH DIABETES MELLITUS HAVE THE SAME MEAL PLAN? Because every person with diabetes mellitus is different, there is not one meal plan that works for everyone. It is very important that you meet with a dietitian who will help you create a meal plan that is just right for you.   This information is not intended to replace advice given to you by your health care provider. Make sure you discuss any questions you have with your health care provider.   Document Released: 12/03/2004 Document Revised: 03/29/2014 Document Reviewed: 02/02/2013 Elsevier Interactive Patient Education 2016 Elsevier Inc.  

## 2015-05-30 ENCOUNTER — Ambulatory Visit: Payer: Self-pay | Admitting: Family Medicine

## 2015-07-07 MED FILL — ?METFORMIN HCL 500MG TABLET: 500 | 30 days supply | Qty: 60 | Fill #1

## 2015-07-15 ENCOUNTER — Ambulatory Visit: Payer: Self-pay | Attending: Family Medicine | Admitting: Family Medicine

## 2015-07-15 ENCOUNTER — Telehealth: Payer: Self-pay

## 2015-07-15 ENCOUNTER — Encounter: Payer: Self-pay | Admitting: Family Medicine

## 2015-07-15 VITALS — BP 137/87 | HR 86 | Temp 98.3°F | Resp 16 | Ht 72.0 in | Wt 213.0 lb

## 2015-07-15 DIAGNOSIS — N644 Mastodynia: Secondary | ICD-10-CM | POA: Insufficient documentation

## 2015-07-15 DIAGNOSIS — E119 Type 2 diabetes mellitus without complications: Secondary | ICD-10-CM | POA: Insufficient documentation

## 2015-07-15 DIAGNOSIS — Z79899 Other long term (current) drug therapy: Secondary | ICD-10-CM | POA: Insufficient documentation

## 2015-07-15 DIAGNOSIS — Z7984 Long term (current) use of oral hypoglycemic drugs: Secondary | ICD-10-CM | POA: Insufficient documentation

## 2015-07-15 DIAGNOSIS — R1084 Generalized abdominal pain: Secondary | ICD-10-CM | POA: Insufficient documentation

## 2015-07-15 DIAGNOSIS — E1169 Type 2 diabetes mellitus with other specified complication: Secondary | ICD-10-CM | POA: Insufficient documentation

## 2015-07-15 LAB — POCT URINE PREGNANCY: Preg Test, Ur: NEGATIVE

## 2015-07-15 LAB — GLUCOSE, POCT (MANUAL RESULT ENTRY): POC GLUCOSE: 144 mg/dL — AB (ref 70–99)

## 2015-07-15 LAB — POCT GLYCOSYLATED HEMOGLOBIN (HGB A1C): Hemoglobin A1C: 7.3

## 2015-07-15 NOTE — Progress Notes (Signed)
Patient c/o SOB when she takes the lisinopril. Patient has stopped taking the med, since then SOB has subsided.  Patient having pain in L breast since last OV. Described as constant pain.  Patient c/o R side pain that's tender to the touch. She reports having Hx of fibroids. Pt's having tenderness in her stomach.

## 2015-07-15 NOTE — Addendum Note (Signed)
Addended by: Arnoldo Morale on: 07/15/2015 04:27 PM   Modules accepted: Orders

## 2015-07-15 NOTE — Patient Instructions (Signed)

## 2015-07-15 NOTE — Telephone Encounter (Signed)
Contacted patient, patient verified name and DOB. Patient doesn't have insurance, so she was given the number to apply for BCCT program. Patient will call and once she's approved, they will set her up for the diagnostic mammogram at the breast center.

## 2015-07-15 NOTE — Progress Notes (Signed)
Subjective:  Patient ID: Tonya Paul, female    DOB: 1975/08/13  Age: 40 y.o. MRN: [id number]  CC: No chief complaint on file.   HPI Tonya Paul is a 40 year old female with a history of type 2 diabetes mellitus (A1c 7.3) comes in the clinic complaining of left breast tenderness for the last few months which has worsened over the last few days and are unrelated to her periods. She denies any breast lumps or breast masses and has a family history of breast cancer in her paternal aunt. LMP first week of March.  Also complains of intermittent right-sided abdominal pain on that she felt a lump which subsequently moved and disappeared but denies any pain at this time. She denies being constipated and states she mostly bowels properly. No nausea, vomiting.  Remains on metformin for diabetes mellitus and has an upcoming appointment with an ophthalmologist in 2 months. She was placed on lisinopril for renal protection but complains it makes her short of breath and she had to stop it.  Outpatient Prescriptions Prior to Visit  Medication Sig Dispense Refill  . Blood Glucose Monitoring Suppl (TRUE METRIX METER) DEVI 1 each by Does not apply route 3 (three) times daily before meals. 1 Device 0  . glucose blood (TRUE METRIX BLOOD GLUCOSE TEST) test strip Used 3 times daily as directed before meals 100 each 12  . lisinopril (PRINIVIL,ZESTRIL) 5 MG tablet Take 1 tablet (5 mg total) by mouth daily. 90 tablet 3  . metFORMIN (GLUCOPHAGE) 500 MG tablet Take 1 tablet (500 mg total) by mouth 2 (two) times daily with a meal. 60 tablet 2  . TRUEPLUS LANCETS 28G MISC 1 each by Does not apply route 3 (three) times daily before meals. 100 each 12   No facility-administered medications prior to visit.    ROS Review of Systems  Constitutional: Negative for activity change, appetite change and fatigue.  HENT: Negative for congestion, sinus pressure and sore throat.   Eyes: Negative for visual disturbance.    Respiratory: Negative for cough, chest tightness, shortness of breath and wheezing.   Cardiovascular: Negative for chest pain and palpitations.  Gastrointestinal: Negative for abdominal pain, constipation and abdominal distention.  Endocrine: Negative for polydipsia.  Genitourinary: Negative for dysuria and frequency.       Left breast pain  Musculoskeletal: Negative for back pain and arthralgias.  Skin: Negative for rash.  Neurological: Negative for tremors, light-headedness and numbness.  Hematological: Does not bruise/bleed easily.  Psychiatric/Behavioral: Negative for behavioral problems and agitation.    Objective:  There were no vitals taken for this visit.  BP/Weight 05/09/2015 04/16/2015 [id number]  Systolic BP [id number]  Diastolic BP 82 95 85  Wt. (Lbs) 204 206 185  BMI 27.66 27.93 25.08      Physical Exam  Constitutional: She is oriented to person, place, and time. She appears well-developed and well-nourished.  Cardiovascular: Normal rate, normal heart sounds and intact distal pulses.   No murmur heard. Pulmonary/Chest: Effort normal and breath sounds normal. She has no wheezes. She has no rales. She exhibits no tenderness.  Abdominal: Soft. Bowel sounds are normal. She exhibits no distension and no mass. There is no tenderness.  Genitourinary: There is breast tenderness (left breast tenderness at 2 o'clock, no palpable masses). No breast swelling.  Musculoskeletal: Normal range of motion.  Neurological: She is alert and oriented to person, place, and time.     Assessment & Plan:   1. Type 2 diabetes mellitus with  other specified complication ([place]) Controlled with A1c of 7.3 Will discontinue lisinopril especially since she has no microalbuminuria Advised to keep upcoming eye appointment. - Glucose (CBG) - POCT A1C  2. Pain of left breast Will follow up on imaging results at next visit - MM Digital Diagnostic Bilat; Future - US BREAST LTD UNI LEFT INC  AXILLA; Future  3. Generalized abdominal pain Advised patient to use OTC laxatives for presumptive treatment of constipation  - POCT urine pregnancy   No orders of the defined types were placed in this encounter.    Follow-up: Return in about 3 weeks (around 08/05/2015), or if symptoms worsen or fail to improve, for follow up on breast pain.   Arnoldo Morale MD

## 2015-08-06 ENCOUNTER — Ambulatory Visit: Payer: Self-pay | Admitting: Family Medicine

## 2015-08-20 ENCOUNTER — Ambulatory Visit
Admission: RE | Admit: 2015-08-20 | Discharge: 2015-08-20 | Disposition: A | Discharge status: Home / Self Care | Payer: Medicaid Other | Source: Ambulatory Visit | Attending: Family Medicine | Admitting: Family Medicine

## 2015-08-20 ENCOUNTER — Other Ambulatory Visit: Payer: Self-pay | Admitting: Family Medicine

## 2015-08-20 DIAGNOSIS — N644 Mastodynia: Secondary | ICD-10-CM

## 2015-08-20 DIAGNOSIS — N6320 Unspecified lump in the left breast, unspecified quadrant: Secondary | ICD-10-CM

## 2015-08-22 ENCOUNTER — Encounter: Payer: Self-pay | Admitting: Family Medicine

## 2015-09-01 ENCOUNTER — Encounter: Payer: Self-pay | Admitting: Family Medicine

## 2015-09-01 ENCOUNTER — Other Ambulatory Visit: Payer: Self-pay

## 2015-09-01 ENCOUNTER — Ambulatory Visit: Payer: Medicaid Other | Attending: Family Medicine | Admitting: Family Medicine

## 2015-09-01 VITALS — BP 139/89 | HR 88 | Temp 98.5°F | Ht 71.0 in | Wt 212.8 lb

## 2015-09-01 DIAGNOSIS — R002 Palpitations: Secondary | ICD-10-CM | POA: Diagnosis not present

## 2015-09-01 DIAGNOSIS — N6320 Unspecified lump in the left breast, unspecified quadrant: Secondary | ICD-10-CM

## 2015-09-01 DIAGNOSIS — E669 Obesity, unspecified: Secondary | ICD-10-CM

## 2015-09-01 DIAGNOSIS — E1169 Type 2 diabetes mellitus with other specified complication: Secondary | ICD-10-CM | POA: Diagnosis not present

## 2015-09-01 DIAGNOSIS — N63 Unspecified lump in breast: Secondary | ICD-10-CM | POA: Diagnosis not present

## 2015-09-01 DIAGNOSIS — R6889 Other general symptoms and signs: Secondary | ICD-10-CM | POA: Insufficient documentation

## 2015-09-01 HISTORY — DX: Unspecified lump in the left breast, unspecified quadrant: N63.20

## 2015-09-01 LAB — TSH: TSH: 1.36 m[IU]/L

## 2015-09-01 LAB — GLUCOSE, POCT (MANUAL RESULT ENTRY): POC Glucose: 249 mg/dl — AB (ref 70–99)

## 2015-09-01 MED ORDER — METFORMIN HCL 500 MG PO TABS
1000.0000 mg | ORAL_TABLET | Freq: Two times a day (BID) | ORAL | Status: DC
Start: 2015-09-01 — End: 2015-10-06

## 2015-09-01 NOTE — Patient Instructions (Signed)

## 2015-09-01 NOTE — Progress Notes (Signed)
Subjective:  Patient ID: Tonya Paul, female    DOB: 1975-07-28  Age: 40 y.o. MRN: [id number]  CC: f/u on breast pain and CBG   HPI Tonya Paul is a 40 year old female with a history of type 2 diabetes mellitus (A1c 7.3 from 06/2015), hypertension who presents today for follow-up of left breast lump and is status post a diagnostic mammogram and left breast ultrasound which revealed a left indeterminate breast mass. She is scheduled for a biopsy in 2 days.  Complains that her blood sugars at home have been elevated in the 200s and she remains on metformin 500 mg twice daily. She has also been stressed about her mammogram findings and is not sure if she has overindulged.  Complains of occasional palpitations which are not precipitated by any events; informs me that her daughter had a sudden cardiac death at the age of 47 and she has an unknown family history of cardiac disease. Also complains of intermittent cold intolerance.  Outpatient Prescriptions Prior to Visit  Medication Sig Dispense Refill  . Blood Glucose Monitoring Suppl (TRUE METRIX METER) DEVI 1 each by Does not apply route 3 (three) times daily before meals. 1 Device 0  . glucose blood (TRUE METRIX BLOOD GLUCOSE TEST) test strip Used 3 times daily as directed before meals 100 each 12  . TRUEPLUS LANCETS 28G MISC 1 each by Does not apply route 3 (three) times daily before meals. 100 each 12  . metFORMIN (GLUCOPHAGE) 500 MG tablet Take 1 tablet (500 mg total) by mouth 2 (two) times daily with a meal. 60 tablet 2   No facility-administered medications prior to visit.    ROS Review of Systems  Constitutional: Negative for activity change, appetite change and fatigue.  HENT: Negative for congestion, sinus pressure and sore throat.   Eyes: Negative for visual disturbance.  Respiratory: Negative for cough, chest tightness, shortness of breath and wheezing.   Cardiovascular: Positive for palpitations. Negative for chest pain.    Gastrointestinal: Negative for abdominal pain, constipation and abdominal distention.  Endocrine: Positive for cold intolerance. Negative for polydipsia.  Genitourinary: Negative for dysuria and frequency.  Musculoskeletal: Negative for back pain and arthralgias.  Skin: Negative for rash.  Neurological: Negative for tremors, light-headedness and numbness.  Hematological: Does not bruise/bleed easily.  Psychiatric/Behavioral: Negative for behavioral problems and agitation.    Objective:  BP 139/89 mmHg  Pulse 88  Temp(Src) 98.5 F (36.9 C)  Ht 5\' 11"  (1.803 m)  Wt 212 lb 12.8 oz (96.525 kg)  BMI 29.69 kg/m2  BP/Weight 09/01/2015 07/15/2015 [id number]  Systolic BP [id number]  Diastolic BP 89 87 82  Wt. (Lbs) 212.8 213 204  BMI 29.69 28.88 27.66      Physical Exam  Constitutional: She is oriented to person, place, and time. She appears well-developed and well-nourished.  Cardiovascular: Normal rate, normal heart sounds and intact distal pulses.   No murmur heard. Pulmonary/Chest: Effort normal and breath sounds normal. She has no wheezes. She has no rales. She exhibits no tenderness.  Abdominal: Soft. Bowel sounds are normal. She exhibits no distension and no mass. There is no tenderness.  Musculoskeletal: Normal range of motion.  Neurological: She is alert and oriented to person, place, and time.    Lab Results  Component Value Date   HGBA1C 7.3 07/15/2015    CLINICAL DATA: Intermittent left lateral breast pain for 2 months. In addition, the patient feels a possible left breast lump at 10 o'clock along the areolar  margin. This is the patient's baseline mammogram.  EXAM: 2D DIGITAL DIAGNOSTIC BILATERAL MAMMOGRAM WITH CAD AND ADJUNCT TOMO  ULTRASOUND LEFT BREAST  COMPARISON: None.  ACR Breast Density Category c: The breast tissue is heterogeneously dense, which may obscure small masses.  FINDINGS: There is a 12 mm oval mass with partially circumscribed  and partially obscured margins within the lateral left breast. No mammographic abnormality is identified underlying the palpable marker of the upper, inner left breast. No suspicious mass, calcifications, or other abnormality is identified within the right breast.  Mammographic images were processed with CAD.  On physical exam, no discrete mass is felt in the patient's area of concern at 10 o'clock.  Targeted ultrasound of the patient's area of concern within the upper, inner left breast demonstrates no suspicious cystic or solid sonographic finding. Ultrasound of the lateral left breast in the area of patient's pain demonstrates a hypoechoic oval mass with irregular margins at 3:30, 7 cm from the nipple measuring 14 x 12 x 13 mm, corresponding to the mass seen mammographically within the lateral left breast. Ultrasound of the left axilla demonstrates no suspicious appearing axillary lymph nodes.  IMPRESSION: Indeterminate left breast mass at 3:30, 7 cm from the nipple.  RECOMMENDATION: Ultrasound-guided left breast biopsy.  I have discussed the findings and recommendations with the patient. Results were also provided in writing at the conclusion of the visit. If applicable, a reminder letter will be sent to the patient regarding the next appointment.  BI-RADS CATEGORY 4: Suspicious.   Electronically Signed  By: Pamelia Hoit M.D.  On: 08/20/2015 17:29  Assessment & Plan:   1. Type 2 diabetes mellitus with other specified complication (HCC) 123456 of 7.3 from 06/2015 Increase metformin dose due to elevated CBGs and home blood sugars - Glucose (CBG) - metFORMIN (GLUCOPHAGE) 500 MG tablet; Take 2 tablets (1,000 mg total) by mouth 2 (two) times daily with a meal.  Dispense: 120 tablet; Refill: 3  2. Palpitations EKG due to history of sudden cardiac death in daughter - NSR, no ST changes - TSH  3. Cold intolerance - TSH  4. Left breast mass Scheduled for left  breast biopsy in two days  5. Obesity  She has gained 27 pounds in the last 6 months. This could be due to the fact that she is more sedentary as she works from home. Advised to incorporate exercise regimen to her daily schedule.   Meds ordered this encounter  Medications  . metFORMIN (GLUCOPHAGE) 500 MG tablet    Sig: Take 2 tablets (1,000 mg total) by mouth 2 (two) times daily with a meal.    Dispense:  120 tablet    Refill:  3    Discontinue previous dose    Follow-up: Return in about 3 months (around 12/02/2015) for follow up on Diabetes Mellitus.   Arnoldo Morale MD

## 2015-09-03 ENCOUNTER — Ambulatory Visit
Admission: RE | Admit: 2015-09-03 | Discharge: 2015-09-03 | Disposition: A | Discharge status: Home / Self Care | Payer: Medicaid Other | Source: Ambulatory Visit | Attending: Family Medicine | Admitting: Family Medicine

## 2015-09-03 ENCOUNTER — Other Ambulatory Visit: Payer: Self-pay | Admitting: Family Medicine

## 2015-09-03 DIAGNOSIS — N6320 Unspecified lump in the left breast, unspecified quadrant: Secondary | ICD-10-CM

## 2015-09-03 HISTORY — PX: BREAST BIOPSY: SHX20

## 2015-09-10 MED FILL — ?METFORMIN HCL 500MG TABLET: 500 | 30 days supply | Qty: 60 | Fill #2

## 2015-09-24 ENCOUNTER — Telehealth: Payer: Self-pay | Admitting: Family Medicine

## 2015-09-24 NOTE — Telephone Encounter (Signed)
Okay to go back to previous dose until office visit.

## 2015-09-24 NOTE — Telephone Encounter (Signed)
Patient called requesting an appt due to her new dm medication has been dropping her sugar, patient would like to know if she can be switched back to 500mg . Please f/up with patient to update, patient is very concered.

## 2015-09-24 NOTE — Telephone Encounter (Signed)
Writer called patient back regarding her concern with the new diabetes medication.  LVM for patient to return call to discuss.

## 2015-09-26 ENCOUNTER — Telehealth: Payer: Self-pay

## 2015-09-26 NOTE — Telephone Encounter (Signed)
Patient returning call to office.  RN advised patient per Dr. Janice Coffin to go back to previous dose until office visit. Priscille Heidelberg, RN, BSN

## 2015-10-06 ENCOUNTER — Encounter: Payer: Self-pay | Admitting: Family Medicine

## 2015-10-06 ENCOUNTER — Ambulatory Visit: Payer: Medicaid Other | Attending: Family Medicine | Admitting: Family Medicine

## 2015-10-06 VITALS — BP 138/91 | HR 83 | Temp 98.0°F | Resp 18 | Ht 71.0 in | Wt 212.4 lb

## 2015-10-06 DIAGNOSIS — R1903 Right lower quadrant abdominal swelling, mass and lump: Secondary | ICD-10-CM | POA: Diagnosis not present

## 2015-10-06 DIAGNOSIS — E1169 Type 2 diabetes mellitus with other specified complication: Secondary | ICD-10-CM | POA: Diagnosis not present

## 2015-10-06 DIAGNOSIS — K648 Other hemorrhoids: Secondary | ICD-10-CM

## 2015-10-06 DIAGNOSIS — R42 Dizziness and giddiness: Secondary | ICD-10-CM

## 2015-10-06 DIAGNOSIS — I10 Essential (primary) hypertension: Secondary | ICD-10-CM

## 2015-10-06 LAB — CBC WITH DIFFERENTIAL/PLATELET
BASOS ABS: 0 {cells}/uL (ref 0–200)
Basophils Relative: 0 %
EOS PCT: 5 %
Eosinophils Absolute: 360 cells/uL (ref 15–500)
HCT: 36.7 % (ref 35.0–45.0)
HEMOGLOBIN: 12.3 g/dL (ref 11.7–15.5)
LYMPHS ABS: 2736 {cells}/uL (ref 850–3900)
Lymphocytes Relative: 38 %
MCH: 28.1 pg (ref 27.0–33.0)
MCHC: 33.5 g/dL (ref 32.0–36.0)
MCV: 84 fL (ref 80.0–100.0)
MONOS PCT: 8 %
MPV: 9.9 fL (ref 7.5–12.5)
Monocytes Absolute: 576 cells/uL (ref 200–950)
NEUTROS ABS: 3528 {cells}/uL (ref 1500–7800)
NEUTROS PCT: 49 %
PLATELETS: 431 10*3/uL — AB (ref 140–400)
RBC: 4.37 MIL/uL (ref 3.80–5.10)
RDW: 14 % (ref 11.0–15.0)
WBC: 7.2 10*3/uL (ref 3.8–10.8)

## 2015-10-06 MED ORDER — METFORMIN HCL 500 MG PO TABS: 500.0000 mg | ORAL_TABLET | Freq: Three times a day (TID) | ORAL | Status: DC

## 2015-10-06 MED ORDER — MECLIZINE HCL 25 MG PO TABS: 25.0000 mg | ORAL_TABLET | Freq: Three times a day (TID) | ORAL | Status: DC | PRN

## 2015-10-06 MED ORDER — HYDROCORTISONE ACE-PRAMOXINE 2.5-1 % RE CREA: 1.0000 "application " | TOPICAL_CREAM | Freq: Three times a day (TID) | RECTAL | Status: DC

## 2015-10-06 NOTE — Progress Notes (Signed)
Pt states she has hemorrhoids and that it has been bothering her for the last few weeks. Pt states she has a lump on the right side of her abdomen that she can feel.  Pt also reports lightheadedness this past Saturday. Pt denies pain today. Pt did take medication today. CBG is 242.

## 2015-10-06 NOTE — Patient Instructions (Signed)

## 2015-10-06 NOTE — Progress Notes (Signed)
Subjective:  Patient ID: Tonya Paul, female    DOB: 1975/04/01  Age: 40 y.o. MRN: [id number]  CC: Diabetes and Hemorrhoids   HPI Cottie Shugarts is 40 year old female with a history of type 2 diabetes mellitus (A1c 7.3 from 06/2015), hypertension who comes into the clinic complaining of painful hemorrhoids. She had hemorrhoids during her pregnancy about 15 years ago and states she noticed a sudden recurrence but denies any form of constipation or bleeding and has not used any medications for it.  Also had to cut down on her dose of metformin from 1000 mg twice daily to 500 mg twice daily due to the rapid drop in blood sugar which she experienced on taking 1000 mg (she states blood sugar dropped from the 300s to about 102)  Complains that a few days ago she developed sudden dizziness and had to stop working on the computer; denies syncope but did take her vertigo medication which she was prescribed a while ago with improvement in symptoms. She endorses working several hours on the computer with very little rest at that time.  Also complains of feeling a mass on the right side of her abdomen which is not painful and sometimes disappears. Denies nausea or vomiting.  Past Medical History  Diagnosis Date  . Diabetes mellitus without complication ([place])   . Hypertension     History reviewed. No pertinent past surgical history.  Allergies  Allergen Reactions  . Aspirin Anaphylaxis  . Ciprofloxacin Itching     Outpatient Prescriptions Prior to Visit  Medication Sig Dispense Refill  . Blood Glucose Monitoring Suppl (TRUE METRIX METER) DEVI 1 each by Does not apply route 3 (three) times daily before meals. 1 Device 0  . glucose blood (TRUE METRIX BLOOD GLUCOSE TEST) test strip Used 3 times daily as directed before meals 100 each 12  . TRUEPLUS LANCETS 28G MISC 1 each by Does not apply route 3 (three) times daily before meals. 100 each 12  . metFORMIN (GLUCOPHAGE) 500 MG tablet Take 2  tablets (1,000 mg total) by mouth 2 (two) times daily with a meal. 120 tablet 3   No facility-administered medications prior to visit.    ROS Review of Systems  Constitutional: Negative for activity change, appetite change and fatigue.  HENT: Negative for congestion, sinus pressure and sore throat.   Eyes: Negative for visual disturbance.  Respiratory: Negative for cough, chest tightness, shortness of breath and wheezing.   Cardiovascular: Negative for chest pain and palpitations.  Gastrointestinal: Negative for abdominal pain, constipation and abdominal distention.  Endocrine: Negative for polydipsia.  Genitourinary: Negative for dysuria and frequency.  Musculoskeletal: Negative for back pain and arthralgias.  Skin: Negative for rash.  Neurological: Positive for light-headedness. Negative for tremors and numbness.  Hematological: Does not bruise/bleed easily.  Psychiatric/Behavioral: Negative for behavioral problems and agitation.    Objective:  BP 138/91 mmHg  Pulse 83  Temp(Src) 98 F (36.7 C) (Oral)  Resp 18  Ht 5\' 11"  (1.803 m)  Wt 212 lb 6.4 oz (96.344 kg)  BMI 29.64 kg/m2  SpO2 94%  BP/Weight 10/06/2015 09/01/2015 [id number]  Systolic BP 0000000 [id number] 0000000  Diastolic BP 91 89 87  Wt. (Lbs) 212.4 212.8 213  BMI 29.64 29.69 28.88      Physical Exam  Constitutional: She is oriented to person, place, and time. She appears well-developed and well-nourished.  Cardiovascular: Normal rate, normal heart sounds and intact distal pulses.   No murmur heard. Pulmonary/Chest: Effort normal and breath sounds  normal. She has no wheezes. She has no rales. She exhibits no tenderness.  Abdominal: Soft. Bowel sounds are normal. She exhibits mass (multiple small abdominal which could be stool as well.). She exhibits no distension. There is no tenderness.  Musculoskeletal: Normal range of motion.  Neurological: She is alert and oriented to person, place, and time.     Assessment & Plan:     1. Essential hypertension Controlled  2. Type 2 diabetes mellitus with other specified complication (HCC) 123456 of 7.3, new A1c is due in one week. Due to complaints of hypoglycemia I have cut back on her dose of metformin from 1000 mg twice daily to 500 mg 3 times daily Keep blood sugar log - metFORMIN (GLUCOPHAGE) 500 MG tablet; Take 1 tablet (500 mg total) by mouth 3 (three) times daily before meals.  Dispense: 90 tablet; Refill: 3  3. Abdominal mass, RLQ (right lower quadrant) Patient complains of abdominal masses Denies constipation - US Abdomen Complete; Future  4. Dizziness and giddiness - meclizine (ANTIVERT) 25 MG tablet; Take 1 tablet (25 mg total) by mouth 3 (three) times daily as needed.  Dispense: 90 tablet; Refill: 1 - CBC with Differential/Platelet  5. Other hemorrhoids Dietary modifications to prevent constipation Increase fiber, water intake. - hydrocortisone-pramoxine (ANALPRAM HC) 2.5-1 % rectal cream; Place 1 application rectally 3 (three) times daily.  Dispense: 30 g; Refill: 2   Meds ordered this encounter  Medications  . metFORMIN (GLUCOPHAGE) 500 MG tablet    Sig: Take 1 tablet (500 mg total) by mouth 3 (three) times daily before meals.    Dispense:  90 tablet    Refill:  3    Discontinue previous dose  . meclizine (ANTIVERT) 25 MG tablet    Sig: Take 1 tablet (25 mg total) by mouth 3 (three) times daily as needed.    Dispense:  90 tablet    Refill:  1  . hydrocortisone-pramoxine (ANALPRAM HC) 2.5-1 % rectal cream    Sig: Place 1 application rectally 3 (three) times daily.    Dispense:  30 g    Refill:  2    Follow-up: Return in about 1 month (around 11/06/2015), or if symptoms worsen or fail to improve, for Pap smear.   Arnoldo Morale MD

## 2015-10-17 ENCOUNTER — Telehealth: Payer: Self-pay | Admitting: Family Medicine

## 2015-10-17 ENCOUNTER — Ambulatory Visit (HOSPITAL_COMMUNITY)
Admission: RE | Admit: 2015-10-17 | Discharge: 2015-10-17 | Disposition: A | Discharge status: Home / Self Care | Payer: Medicaid Other | Source: Ambulatory Visit | Attending: Family Medicine | Admitting: Family Medicine

## 2015-10-17 ENCOUNTER — Other Ambulatory Visit: Payer: Self-pay | Admitting: Family Medicine

## 2015-10-17 DIAGNOSIS — R1903 Right lower quadrant abdominal swelling, mass and lump: Secondary | ICD-10-CM

## 2015-10-17 MED FILL — ?MECLIZINE 25 MG TABLET: 25 | 30 days supply | Qty: 90 | Fill #0

## 2015-10-17 MED FILL — ?METFORMIN HCL 500MG TABLET: 500 | 30 days supply | Qty: 90 | Fill #0

## 2015-10-17 MED FILL — HYDROCORT-PRAMOXINE 2.5-1%: 2.5-1 | 15 days supply | Qty: 30 | Fill #0

## 2015-10-17 NOTE — Telephone Encounter (Signed)
Patient came in and stated that she was supposed to have orders to get an ultrasound, however, the orders stated for liver and gallbladder. Those were incorrect. Patient needs orders corrected and a new appointment.

## 2015-11-03 ENCOUNTER — Encounter: Payer: Self-pay | Admitting: Family Medicine

## 2015-11-18 ENCOUNTER — Encounter: Payer: Self-pay | Admitting: Family Medicine

## 2015-11-18 ENCOUNTER — Ambulatory Visit: Payer: Medicaid Other | Attending: Family Medicine | Admitting: Family Medicine

## 2015-11-18 ENCOUNTER — Other Ambulatory Visit (HOSPITAL_COMMUNITY)
Admission: RE | Admit: 2015-11-18 | Discharge: 2015-11-18 | Disposition: A | Discharge status: Home / Self Care | Payer: Medicaid Other | Source: Ambulatory Visit | Attending: Family Medicine | Admitting: Family Medicine

## 2015-11-18 VITALS — BP 138/92 | HR 87 | Temp 98.1°F | Wt 211.0 lb

## 2015-11-18 DIAGNOSIS — Z124 Encounter for screening for malignant neoplasm of cervix: Secondary | ICD-10-CM

## 2015-11-18 DIAGNOSIS — Z01419 Encounter for gynecological examination (general) (routine) without abnormal findings: Secondary | ICD-10-CM | POA: Insufficient documentation

## 2015-11-18 DIAGNOSIS — R140 Abdominal distension (gaseous): Secondary | ICD-10-CM

## 2015-11-18 DIAGNOSIS — E1169 Type 2 diabetes mellitus with other specified complication: Secondary | ICD-10-CM

## 2015-11-18 DIAGNOSIS — R1031 Right lower quadrant pain: Secondary | ICD-10-CM

## 2015-11-18 DIAGNOSIS — Z1151 Encounter for screening for human papillomavirus (HPV): Secondary | ICD-10-CM | POA: Insufficient documentation

## 2015-11-18 DIAGNOSIS — K648 Other hemorrhoids: Secondary | ICD-10-CM

## 2015-11-18 DIAGNOSIS — K649 Unspecified hemorrhoids: Secondary | ICD-10-CM

## 2015-11-18 DIAGNOSIS — G8929 Other chronic pain: Secondary | ICD-10-CM

## 2015-11-18 HISTORY — DX: Unspecified hemorrhoids: K64.9

## 2015-11-18 LAB — GLUCOSE, POCT (MANUAL RESULT ENTRY): POC Glucose: 192 mg/dl — AB (ref 70–99)

## 2015-11-18 LAB — POCT GLYCOSYLATED HEMOGLOBIN (HGB A1C): Hemoglobin A1C: 10

## 2015-11-18 MED ORDER — GLIPIZIDE 5 MG PO TABS: 5.0000 mg | ORAL_TABLET | Freq: Two times a day (BID) | ORAL | 3 refills | Status: DC

## 2015-11-18 MED ORDER — OMEPRAZOLE 20 MG PO CPDR: 20.0000 mg | DELAYED_RELEASE_CAPSULE | Freq: Every day | ORAL | 3 refills | Status: DC

## 2015-11-18 MED FILL — ?OMEPRAZOLE DR 20 MG CAPSUL: 20 | 30 days supply | Qty: 30 | Fill #0

## 2015-11-18 MED FILL — glipiZIDE 5 MG TABS: 5 | 30 days supply | Qty: 60 | Fill #0

## 2015-11-18 NOTE — Progress Notes (Signed)
Subjective:  Patient ID: [name], female    DOB: [birth year]  Age: 40 y.o. MRN: [id number]  CC: Abnormal Pap Smear   HPI Tonya Paul is a 40 year old female with a history of type 2 diabetes mellitus A1c 10.0 which has trended up from 7.3, 3 months ago who presents today for a Pap smear.  She endorses the fact that her blood sugars have been in the 200-300 range. She was previously on metformin 1000 mg twice daily but was unable to tolerate this dose due to GI side effects and so her dose was reduced to 500 mg 3 times daily. She also endorses weight gain.  Complains of occasional right lower quadrant pain and bloating and informs me she was previously told she had fibroids at a clinic in [place]. If she does have fibroids she would like to have surgical management. Also has hemorrhoids that occasionally flareup Denies flares at this time but would like to have a surgical referral in the event that she has flareups as they have not responded to conservative management.  She recently got married 2 weeks ago.  Past Medical History:  Diagnosis Date  . Diabetes mellitus without complication ([place])   . Hypertension     No past surgical history on file.  Allergies  Allergen Reactions  . Aspirin Anaphylaxis  . Ciprofloxacin Itching     Outpatient Medications Prior to Visit  Medication Sig Dispense Refill  . Blood Glucose Monitoring Suppl (TRUE METRIX METER) DEVI 1 each by Does not apply route 3 (three) times daily before meals. 1 Device 0  . glucose blood (TRUE METRIX BLOOD GLUCOSE TEST) test strip Used 3 times daily as directed before meals 100 each 12  . hydrocortisone-pramoxine (ANALPRAM HC) 2.5-1 % rectal cream Place 1 application rectally 3 (three) times daily. 30 g 2  . meclizine (ANTIVERT) 25 MG tablet Take 1 tablet (25 mg total) by mouth 3 (three) times daily as needed. 90 tablet 1  . metFORMIN (GLUCOPHAGE) 500 MG tablet Take 1 tablet (500 mg total) by mouth 3 (three)  times daily before meals. 90 tablet 3  . TRUEPLUS LANCETS 28G MISC 1 each by Does not apply route 3 (three) times daily before meals. 100 each 12   No facility-administered medications prior to visit.     ROS Review of Systems  Constitutional: Negative for activity change, appetite change and fatigue.  HENT: Negative for congestion, sinus pressure and sore throat.   Eyes: Negative for visual disturbance.  Respiratory: Negative for cough, chest tightness, shortness of breath and wheezing.   Cardiovascular: Negative for chest pain and palpitations.  Gastrointestinal: Positive for abdominal pain (right lower quadrant). Negative for abdominal distention and constipation.  Endocrine: Negative for polydipsia.  Genitourinary: Negative for dysuria and frequency.  Musculoskeletal: Negative for arthralgias and back pain.  Skin: Negative for rash.  Neurological: Negative for tremors, light-headedness and numbness.  Hematological: Does not bruise/bleed easily.  Psychiatric/Behavioral: Negative for agitation and behavioral problems.    Objective:  BP (!) 138/92 (BP Location: Left Arm, Patient Position: Sitting, Cuff Size: Large)   Pulse 87   Temp 98.1 F (36.7 C) (Oral)   Wt 211 lb (95.7 kg)   LMP 10/31/2015   SpO2 99%   BMI 29.43 kg/m   BP/Weight 11/18/2015 10/06/2015 [id number]  Systolic BP 0000000 0000000 [id number]  Diastolic BP 92 91 89  Wt. (Lbs) 211 212.4 212.8  BMI 29.43 29.64 29.69      Physical Exam  Constitutional: She  is oriented to person, place, and time. She appears well-developed and well-nourished.  Cardiovascular: Normal rate, normal heart sounds and intact distal pulses.   No murmur heard. Pulmonary/Chest: Effort normal and breath sounds normal. She has no wheezes. She has no rales. She exhibits no tenderness.  Abdominal: Soft. Bowel sounds are normal. She exhibits no distension and no mass. There is no tenderness.  Genitourinary:  Genitourinary Comments: Normal external  genitalia Normal vaginal Normal cervix and adnexa  Musculoskeletal: Normal range of motion.  Neurological: She is alert and oriented to person, place, and time.    Lab Results  Component Value Date   HGBA1C 10.0 11/18/2015    Assessment & Plan:   1. Type 2 diabetes mellitus with other specified complication (HCC) Uncontrolled with A1c of 10.0 Glipizide added to regimen Continue metformin We will review blood sugar log at next visit - POCT glucose (manual entry) - POCT glycosylated hemoglobin (Hb A1C) - glipiZIDE (GLUCOTROL) 5 MG tablet; Take 1 tablet (5 mg total) by mouth 2 (two) times daily before a meal.  Dispense: 60 tablet; Refill: 3  2. Screening for cervical cancer - Cytology - PAP [place]  3. Abdominal pain, chronic, right lower quadrant Will need to evaluate for fibroids versus ovarian cyst - US Pelvis Complete; Future - US Transvaginal Non-OB; Future  4. Abdominal bloating Could be symptoms of GERD - omeprazole (PRILOSEC) 20 MG capsule; Take 1 capsule (20 mg total) by mouth daily.  Dispense: 30 capsule; Refill: 3  5. Other hemorrhoids Continue conservative measures, sitz bath, hemorrhoid creams If symptoms persist will refer to general surgery   Meds ordered this encounter  Medications  . omeprazole (PRILOSEC) 20 MG capsule    Sig: Take 1 capsule (20 mg total) by mouth daily.    Dispense:  30 capsule    Refill:  3  . glipiZIDE (GLUCOTROL) 5 MG tablet    Sig: Take 1 tablet (5 mg total) by mouth 2 (two) times daily before a meal.    Dispense:  60 tablet    Refill:  3    Follow-up: Return in about 6 weeks (around 12/30/2015) for Follow-up of right lower quadrant pain.   Arnoldo Morale MD

## 2015-11-19 ENCOUNTER — Encounter: Payer: Self-pay | Admitting: Family Medicine

## 2015-11-19 ENCOUNTER — Other Ambulatory Visit: Payer: Self-pay | Admitting: Family Medicine

## 2015-11-19 DIAGNOSIS — Z8241 Family history of sudden cardiac death: Secondary | ICD-10-CM

## 2015-11-19 NOTE — Progress Notes (Signed)
Patient sent an email requesting cardiology referral due to family history of sudden cardiac death. Referral placed.

## 2015-11-20 LAB — CYTOLOGY - PAP

## 2015-11-23 ENCOUNTER — Encounter: Payer: Self-pay | Admitting: Family Medicine

## 2015-11-25 ENCOUNTER — Telehealth: Payer: Self-pay | Admitting: Family Medicine

## 2015-11-25 ENCOUNTER — Other Ambulatory Visit: Payer: Self-pay | Admitting: Family Medicine

## 2015-11-25 ENCOUNTER — Encounter (HOSPITAL_COMMUNITY): Payer: Self-pay | Admitting: Student-PharmD

## 2015-11-25 DIAGNOSIS — K648 Other hemorrhoids: Secondary | ICD-10-CM

## 2015-11-25 NOTE — Telephone Encounter (Signed)
Pt. Called stating that she has a hemorrhoid and her PCP had stated that when she had one to call and she would schedule an appointment to have it removed. Please f/u

## 2015-11-25 NOTE — Progress Notes (Signed)
Patient sent a my chart message requesting referral to general surgery.

## 2015-11-26 ENCOUNTER — Emergency Department
Admission: EM | Admit: 2015-11-26 | Discharge: 2015-11-26 | Disposition: A | Discharge status: Home / Self Care | Payer: Medicaid Other | Attending: Emergency Medicine | Admitting: Emergency Medicine

## 2015-11-26 DIAGNOSIS — K649 Unspecified hemorrhoids: Secondary | ICD-10-CM | POA: Diagnosis present

## 2015-11-26 DIAGNOSIS — E119 Type 2 diabetes mellitus without complications: Secondary | ICD-10-CM | POA: Insufficient documentation

## 2015-11-26 DIAGNOSIS — Z79899 Other long term (current) drug therapy: Secondary | ICD-10-CM | POA: Diagnosis not present

## 2015-11-26 DIAGNOSIS — Z7984 Long term (current) use of oral hypoglycemic drugs: Secondary | ICD-10-CM | POA: Insufficient documentation

## 2015-11-26 DIAGNOSIS — K644 Residual hemorrhoidal skin tags: Secondary | ICD-10-CM | POA: Diagnosis not present

## 2015-11-26 DIAGNOSIS — I10 Essential (primary) hypertension: Secondary | ICD-10-CM | POA: Diagnosis not present

## 2015-11-26 DIAGNOSIS — B372 Candidiasis of skin and nail: Secondary | ICD-10-CM

## 2015-11-26 MED ORDER — FLUCONAZOLE 150 MG PO TABS: 150.0000 mg | ORAL_TABLET | Freq: Every day | ORAL | 0 refills | Status: DC

## 2015-11-26 MED ORDER — HYDROCORTISONE ACE-PRAMOXINE 1-1 % RE FOAM: 1.0000 | Freq: Two times a day (BID) | RECTAL | 0 refills | Status: AC

## 2015-11-26 NOTE — ED Notes (Signed)
Pt reports hx of hemorrhoids, states she has had pain in rectum x 3 months.  Pain worse today.  Reports mild bleeding at times. Skin w/d with good color.

## 2015-11-26 NOTE — ED Provider Notes (Signed)
[facility] Emergency Department Provider Note  ____________________________________________  Time seen: Approximately 9:58 AM  I have reviewed the triage vital signs and the nursing notes.   HISTORY  Chief Complaint Hemorrhoids    HPI Tonya Paul is a 40 y.o. female , NAD, presents to emergency with 3 month history of hemorrhoids. States she was seen by her primary care provider 2 months ago and given a cream to utilize. Notes that the hemorrhoids up, more painful and irritated over the last few weeks. Has been sitting on soft pillows each does not seem to help. Does work from home and states she sits for long periods of time completed work. Does note occasional bleeding with hard bowel movements. Does note that one of the hemorrhoids was inflamed in the last couple of days but has now subsided. Also notes itching and irritation to the surrounding skin. Denies any fevers, chills, body aches. Has not had any abdominal pain, nausea, vomiting. No changes in urinary or bowel habits. No saddle paresthesias or loss of bowel or bladder control.   Past Medical History:  Diagnosis Date  . Diabetes mellitus without complication ([place])   . Hypertension     Patient Active Problem List   Diagnosis Date Noted  . Hemorrhoids 11/18/2015  . Left breast mass 09/01/2015  . Obesity 09/01/2015  . Diabetes ([place]) 07/15/2015  . Hypertension 04/16/2015    History reviewed. No pertinent surgical history.  Prior to Admission medications   Medication Sig Start Date End Date Taking? Authorizing Provider  Blood Glucose Monitoring Suppl (TRUE METRIX METER) DEVI 1 each by Does not apply route 3 (three) times daily before meals. 04/16/15   Arnoldo Morale, MD  fluconazole (DIFLUCAN) 150 MG tablet Take 1 tablet (150 mg total) by mouth daily. Take one tablet today and another in 3 days if no better 11/26/15   Katrinia Straker L Teandra Harlan, PA-C  glipiZIDE (GLUCOTROL) 5 MG tablet Take 1 tablet (5 mg total) by  mouth 2 (two) times daily before a meal. 11/18/15   Arnoldo Morale, MD  glucose blood (TRUE METRIX BLOOD GLUCOSE TEST) test strip Used 3 times daily as directed before meals 04/16/15   Arnoldo Morale, MD  hydrocortisone-pramoxine (PROCTOFOAM HC) rectal foam Place 1 applicator rectally 2 (two) times daily. After a bowel movement 11/26/15 12/01/15  Breeze Berringer L Alicyn Klann, PA-C  meclizine (ANTIVERT) 25 MG tablet Take 1 tablet (25 mg total) by mouth 3 (three) times daily as needed. 10/06/15   Arnoldo Morale, MD  metFORMIN (GLUCOPHAGE) 500 MG tablet Take 1 tablet (500 mg total) by mouth 3 (three) times daily before meals. 10/06/15 10/05/16  Arnoldo Morale, MD  omeprazole (PRILOSEC) 20 MG capsule Take 1 capsule (20 mg total) by mouth daily. 11/18/15   Arnoldo Morale, MD  TRUEPLUS LANCETS 28G MISC 1 each by Does not apply route 3 (three) times daily before meals. 04/16/15   Arnoldo Morale, MD    Allergies Aspirin and Ciprofloxacin  Family History  Problem Relation Age of Onset  . Hypertension Mother     Social History Social History  Substance Use Topics  . Smoking status: Never Smoker  . Smokeless tobacco: Never Used  . Alcohol use No     Review of Systems  Constitutional: No fever/chills Gastrointestinal: Positive hemorrhoids with occasional bleeding, itching. No abdominal pain.  No nausea, vomiting.  No diarrhea,  constipation. Genitourinary: Negative for dysuria,  hematuria.  Musculoskeletal: Negative for back pain.  Skin: Negative for rash, redness, swelling. Neurological: Negative for  saddle paresthesia, loss of bowel or bladder control. 10-point ROS otherwise negative.  ____________________________________________   PHYSICAL EXAM:  VITAL SIGNS: ED Triage Vitals  Enc Vitals Group     BP 11/26/15 0936 (!) 146/87     Pulse Rate 11/26/15 0936 (!) 113     Resp 11/26/15 0936 18     Temp 11/26/15 0936 97.9 F (36.6 C)     Temp Source 11/26/15 0936 Oral     SpO2 11/26/15 0936 98 %     Weight 11/26/15 0937  205 lb (93 kg)     Height 11/26/15 0937 5\' 11"  (1.803 m)     Head Circumference --      Peak Flow --      Pain Score 11/26/15 0937 10     Pain Loc --      Pain Edu? --      Excl. in [place]? --     Physical exam completed in the presence of Geryl Rankins, PA-S  Constitutional: Alert and oriented. Well appearing and in no acute distress. Eyes: Conjunctivae are normal.  Head: Atraumatic. Cardiovascular: Good peripheral circulation. Respiratory: Normal respiratory effort without tachypnea or retractions. Gastrointestinal: Anal tone is normal. One external hemorrhoid noted approximately 4 mm in length without any evidence of being thrombosed. Mild irritation to palpation. No active bleeding. Skin about the right and left buttock cheek proximal to the anus has areas of hypopigmentation with the patient notes itching and mild irritation. No other skin sores, oozing or weeping. No induration or evidence of abscess.  Neurologic:  Normal speech and language. No gross focal neurologic deficits are appreciated.  Skin:  Skin is warm, dry and intact. No rash noted. Psychiatric: Mood and affect are normal. Speech and behavior are normal. Patient exhibits appropriate insight and judgement.   ____________________________________________   LABS  None ____________________________________________  EKG  None ____________________________________________  RADIOLOGY  None ____________________________________________    PROCEDURES  Procedure(s) performed: None   Procedures   Medications - No data to display   ____________________________________________   INITIAL IMPRESSION / ASSESSMENT AND PLAN / ED COURSE  Pertinent labs & imaging results that were available during my care of the patient were reviewed by me and considered in my medical decision making (see chart for details).  Clinical Course    Patient's diagnosis is consistent with External hemorrhoids with skin yeast  infection. Patient will be discharged home with prescriptions for Proctofoam HC and fluconazole tablets to take and use as directed. Patient is advised to buy a "doughnut" pillow to sit on to alleviate pressure around the buttocks. Patient is to follow up with Dr. Tiffany Kocher in gastroenterology if symptoms persist past this treatment course. Patient is given ED precautions to return to the ED for any worsening or new symptoms.    ____________________________________________  FINAL CLINICAL IMPRESSION(S) / ED DIAGNOSES  Final diagnoses:  External hemorrhoids  Skin yeast infection      NEW MEDICATIONS STARTED DURING THIS VISIT:  New Prescriptions   FLUCONAZOLE (DIFLUCAN) 150 MG TABLET    Take 1 tablet (150 mg total) by mouth daily. Take one tablet today and another in 3 days if no better   HYDROCORTISONE-PRAMOXINE (PROCTOFOAM HC) RECTAL FOAM    Place 1 applicator rectally 2 (two) times daily. After a bowel movement         Braxton Feathers, PA-C 11/26/15 1039    Earleen Newport, MD 11/26/15 1416

## 2015-11-26 NOTE — ED Triage Notes (Signed)
Pt c/o hemorrhoid pain for the past 2-3 months, states she was seen by her PCP and rx an ointment without relief, states she has used tucks pads and other OTC med.

## 2015-11-26 NOTE — Telephone Encounter (Signed)
Referral was placed yesterday and my chart message was sent to her.

## 2015-11-26 NOTE — Discharge Instructions (Signed)
Please purchase a "donut" pillow to alleviate pressure on your bottom while sitting.

## 2015-11-27 NOTE — Telephone Encounter (Signed)
Writer spoke with patient who stated that she already has an appt with the GI doctor next week to address the issue.

## 2015-12-03 ENCOUNTER — Encounter: Payer: Self-pay | Admitting: General Surgery

## 2015-12-03 ENCOUNTER — Ambulatory Visit (INDEPENDENT_AMBULATORY_CARE_PROVIDER_SITE_OTHER): Payer: Medicaid Other | Admitting: General Surgery

## 2015-12-03 ENCOUNTER — Ambulatory Visit (INDEPENDENT_AMBULATORY_CARE_PROVIDER_SITE_OTHER): Payer: Medicaid Other | Admitting: Cardiovascular Disease

## 2015-12-03 ENCOUNTER — Encounter: Payer: Self-pay | Admitting: Cardiovascular Disease

## 2015-12-03 VITALS — BP 145/66 | HR 101 | Temp 98.8°F | Ht 72.0 in | Wt 214.4 lb

## 2015-12-03 VITALS — BP 138/90 | HR 88 | Ht 72.0 in | Wt 213.6 lb

## 2015-12-03 DIAGNOSIS — Z8249 Family history of ischemic heart disease and other diseases of the circulatory system: Secondary | ICD-10-CM

## 2015-12-03 DIAGNOSIS — K602 Anal fissure, unspecified: Secondary | ICD-10-CM

## 2015-12-03 DIAGNOSIS — Z8241 Family history of sudden cardiac death: Secondary | ICD-10-CM | POA: Insufficient documentation

## 2015-12-03 DIAGNOSIS — R002 Palpitations: Secondary | ICD-10-CM

## 2015-12-03 NOTE — Patient Instructions (Addendum)
Medication Instructions:  Your physician recommends that you continue on your current medications as directed. Please refer to the Current Medication list given to you today.  Labwork: none  Testing/Procedures: Your physician has recommended that you wear an event monitor. Event monitors are medical devices that record the heart's electrical activity. Doctors most often [place] these monitors to diagnose arrhythmias. Arrhythmias are problems with the speed or rhythm of the heartbeat. The monitor is a small, portable device. You can wear one while you do your normal daily activities. This is usually used to diagnose what is causing palpitations/syncope (passing out). 30 day   Your physician has requested that you have an echocardiogram. Echocardiography is a painless test that uses sound waves to create images of your heart. It provides your doctor with information about the size and shape of your heart and how well your heart's chambers and valves are working. This procedure takes approximately one hour. There are no restrictions for this procedure.  [address]  Follow-Up: Your physician recommends that you schedule a follow-up appointment in: 2 month ov  You have been referred to Dr Caryl Comes at the [facility]   If you need a refill on your cardiac medications before your next appointment, please call your pharmacy.

## 2015-12-03 NOTE — Assessment & Plan Note (Signed)
Tonya Paul is self-referred for evaluation of a family history of sudden cardiac death. She is a 40 year old mildly overweight married African-American female with a recent diagnosis of type 2 diabetes. Her daughter at age 2 died suddenly 2012/11/27 of unclear reasons. Her fourth cousin did as well. She is a mildly prolonged QTC. I'm worried about a Chanelopathy  and/or an inherited cause of sudden death. She also complains of palpitations which she said over the last several months. I'm going to get a 2-D echocardiogram, 30 day event monitor and refer her to Dr. Jolyn Nap for consideration and evaluation of inheritable causes of sudden cardiac death.

## 2015-12-03 NOTE — Progress Notes (Signed)
Patient ID: Tonya Paul, female   DOB: 05/25/1975, 40 y.o.   MRN: [id number]  CC: rectal pain  HPI Tonya Paul is a 40 y.o. female who presents clinic today for evaluation of rectal pain. Patient states for the last several months that she has pain after defecation. Initially she thought showed an external hemorrhoid as the cause of her problems that she did have an external hemorrhoid with a raised tender "grape ". That went down but the pain persisted. She states every time she has a bowel movement she feels like she is "pooping glass ". She's also noted some blood on toilet paper after bowel movements. The area has progressed the point where she sought treatment in the emergency room in the last 2 weeks. She denies any fevers, chills, nausea, vomiting, chest pain, shortness of breath. She has had intermittent diarrhea and constipation during this timeframe. Constipation does make her symptoms worse.  HPI  Past Medical History:  Diagnosis Date  . Diabetes mellitus without complication ([place])   . Hypertension     History reviewed. No pertinent surgical history.  Family History  Problem Relation Age of Onset  . Hypertension Mother   . Lung cancer Mother   . Throat cancer Father   . Diabetes Sister   . Lupus Maternal Grandmother   . Stroke Maternal Grandfather   . Sudden Cardiac Death Daughter     Social History Social History  Substance Use Topics  . Smoking status: Never Smoker  . Smokeless tobacco: Never Used  . Alcohol use No    Allergies  Allergen Reactions  . Aspirin Anaphylaxis  . Ciprofloxacin Itching    Current Outpatient Prescriptions  Medication Sig Dispense Refill  . Blood Glucose Monitoring Suppl (TRUE METRIX METER) DEVI 1 each by Does not apply route 3 (three) times daily before meals. 1 Device 0  . fluconazole (DIFLUCAN) 150 MG tablet Take 1 tablet (150 mg total) by mouth daily. Take one tablet today and another in 3 days if no better 2 tablet 0  .  glipiZIDE (GLUCOTROL) 5 MG tablet Take 1 tablet (5 mg total) by mouth 2 (two) times daily before a meal. 60 tablet 3  . glucose blood (TRUE METRIX BLOOD GLUCOSE TEST) test strip Used 3 times daily as directed before meals 100 each 12  . meclizine (ANTIVERT) 25 MG tablet Take 1 tablet (25 mg total) by mouth 3 (three) times daily as needed. 90 tablet 1  . metFORMIN (GLUCOPHAGE) 500 MG tablet Take 1 tablet (500 mg total) by mouth 3 (three) times daily before meals. 90 tablet 3  . omeprazole (PRILOSEC) 20 MG capsule Take 1 capsule (20 mg total) by mouth daily. 30 capsule 3  . TRUEPLUS LANCETS 28G MISC 1 each by Does not apply route 3 (three) times daily before meals. 100 each 12   No current facility-administered medications for this visit.      Review of Systems A Multi-point review of systems was asked and was negative except for the findings documented in the history of present illness  Physical Exam Blood pressure (!) 145/66, pulse (!) 101, temperature 98.8 F (37.1 C), temperature source Oral, height 6' (1.829 m), weight 97.3 kg (214 lb 6.4 oz), last menstrual period 12/01/2015. CONSTITUTIONAL: No acute distress. EYES: Pupils are equal, round, and reactive to light, Sclera are non-icteric. EARS, NOSE, MOUTH AND THROAT: The oropharynx is clear. The oral mucosa is pink and moist. Hearing is intact to voice. LYMPH NODES:  Lymph nodes in  the neck are normal. RESPIRATORY:  Lungs are clear. There is normal respiratory effort, with equal breath sounds bilaterally, and without pathologic use of accessory muscles. CARDIOVASCULAR: Heart is regular without murmurs, gallops, or rubs. GI: The abdomen is soft, nontender, and nondistended. There are no palpable masses. There is no hepatosplenomegaly. There are normal bowel sounds in all quadrants. GU: Rectal exam does show a decompressed external hemorrhoid without evidence of thrombosis.  There is a visualized tear in the mucosa at the 6:00 point if the  head is at the 12:00. No evidence of active bleeding. MUSCULOSKELETAL: Normal muscle strength and tone. No cyanosis or edema.   SKIN: Turgor is good and there are no pathologic skin lesions or ulcers. NEUROLOGIC: Motor and sensation is grossly normal. Cranial nerves are grossly intact. PSYCH:  Oriented to person, place and time. Affect is normal.  Data Reviewed No images and labs to review I have personally reviewed the patient's imaging, laboratory findings and medical records.    Assessment    Anal fissure    Plan    40 year old female with a symptomatic anal fissure. Discussed at length the differences between internal/external hemorrhoids and anal fissures. After we described the pathophysiology also described the treatment options for anal fissures noting that surgical intervention is a last resort. Plan to proceed with a topical calcium channel blocker compounded with lidocaine and a petroleum based. Discussed that this can take many weeks to fully heal but the vast majority patient's heal without surgical intervention. Prescription will be called in from clinic today and she'll follow-up in clinic in 2-4 weeks to evaluate whether or not there is been improvement.     Time spent with the patient was 45 minutes, with more than 50% of the time spent in face-to-face education, counseling and care coordination.     Clayburn Pert, MD FACS General Surgeon 12/03/2015, 1:48 PM

## 2015-12-03 NOTE — Patient Instructions (Addendum)
We have sent a medication to your pharmacy. You will apply this to your rectal area three times daily for 6 weeks total.  This medication has been called to [facility] at [address], [place], [place] [address] - [phone]  We will see you back in 3 weeks to see how this is working.

## 2015-12-03 NOTE — Progress Notes (Signed)
12/03/2015 [name]   14-Sep-1975  [id number]  Primary Physician Arnoldo Morale, MD Primary Cardiologist: Lorretta Harp MD Renae Gloss  HPI:  Tonya Paul is a delightful 40 year old moderately overweight married African-American female mother of one 15 year old son who is self-referred for evaluation of inheritable causes of sudden cardiac death. Unfortunately, her 46 year old daughter died suddenly 2012-12-07 of unclear reasons. Her fourth cousin did as well. Her only risk factor for heart disease as type 2 diabetes. She's never had a heart attack and stroke. She does get occasional shortness of breath but denies chest pain. She's had palpitations that occur daily over the last several months. She does not drink caffeine or alcohol.   Current Outpatient Prescriptions  Medication Sig Dispense Refill  . Blood Glucose Monitoring Suppl (TRUE METRIX METER) DEVI 1 each by Does not apply route 3 (three) times daily before meals. 1 Device 0  . fluconazole (DIFLUCAN) 150 MG tablet Take 1 tablet (150 mg total) by mouth daily. Take one tablet today and another in 3 days if no better 2 tablet 0  . glipiZIDE (GLUCOTROL) 5 MG tablet Take 1 tablet (5 mg total) by mouth 2 (two) times daily before a meal. 60 tablet 3  . glucose blood (TRUE METRIX BLOOD GLUCOSE TEST) test strip Used 3 times daily as directed before meals 100 each 12  . meclizine (ANTIVERT) 25 MG tablet Take 1 tablet (25 mg total) by mouth 3 (three) times daily as needed. 90 tablet 1  . metFORMIN (GLUCOPHAGE) 500 MG tablet Take 1 tablet (500 mg total) by mouth 3 (three) times daily before meals. 90 tablet 3  . omeprazole (PRILOSEC) 20 MG capsule Take 1 capsule (20 mg total) by mouth daily. 30 capsule 3  . TRUEPLUS LANCETS 28G MISC 1 each by Does not apply route 3 (three) times daily before meals. 100 each 12   No current facility-administered medications for this visit.     Allergies  Allergen Reactions  . Aspirin  Anaphylaxis  . Ciprofloxacin Itching    Social History   Social History  . Marital status: Single    Spouse name: N/A  . Number of children: N/A  . Years of education: N/A   Occupational History  . Not on file.   Social History Main Topics  . Smoking status: Never Smoker  . Smokeless tobacco: Never Used  . Alcohol use No  . Drug use: No  . Sexual activity: Not on file   Other Topics Concern  . Not on file   Social History Narrative  . No narrative on file     Review of Systems: General: negative for chills, fever, night sweats or weight changes.  Cardiovascular: negative for chest pain, dyspnea on exertion, edema, orthopnea, palpitations, paroxysmal nocturnal dyspnea or shortness of breath Dermatological: negative for rash Respiratory: negative for cough or wheezing Urologic: negative for hematuria Abdominal: negative for nausea, vomiting, diarrhea, bright red blood per rectum, melena, or hematemesis Neurologic: negative for visual changes, syncope, or dizziness All other systems reviewed and are otherwise negative except as noted above.    Blood pressure 138/90, pulse 88, height 6' (1.829 m), weight 213 lb 9.6 oz (96.9 kg), last menstrual period 10/31/2015.  General appearance: alert and no distress Neck: no adenopathy, no carotid bruit, no JVD, supple, symmetrical, trachea midline and thyroid not enlarged, symmetric, no tenderness/mass/nodules Lungs: clear to auscultation bilaterally Heart: regular rate and rhythm, S1, S2 normal, no murmur, click, rub or gallop  Extremities: extremities normal, atraumatic, no cyanosis or edema  EKG normal sinus rhythm at 88 with a QTc interval of 459 ms. I personally reviewed this EKG  ASSESSMENT AND PLAN:   Family history of heart disease Tonya Paul is self-referred for evaluation of a family history of sudden cardiac death. She is a 40 year old mildly overweight married African-American female with a recent diagnosis of type 2  diabetes. Her daughter at age 66 died suddenly 11/18/12 of unclear reasons. Her fourth cousin did as well. She is a mildly prolonged QTC. I'm worried about a Chanelopathy  and/or an inherited cause of sudden death. She also complains of palpitations which she said over the last several months. I'm going to get a 2-D echocardiogram, 30 day event monitor and refer her to Dr. Jolyn Nap for consideration and evaluation of inheritable causes of sudden cardiac death.      Lorretta Harp MD FACP,FACC,FAHA, [facility] 12/03/2015 10:19 AM

## 2015-12-05 ENCOUNTER — Ambulatory Visit (HOSPITAL_COMMUNITY): Admission: RE | Admit: 2015-12-05 | Payer: Medicaid Other | Source: Ambulatory Visit

## 2015-12-08 ENCOUNTER — Ambulatory Visit (INDEPENDENT_AMBULATORY_CARE_PROVIDER_SITE_OTHER): Payer: Medicaid Other

## 2015-12-08 ENCOUNTER — Other Ambulatory Visit: Payer: Self-pay | Admitting: Cardiovascular Disease

## 2015-12-08 DIAGNOSIS — R002 Palpitations: Secondary | ICD-10-CM

## 2015-12-08 DIAGNOSIS — Z8249 Family history of ischemic heart disease and other diseases of the circulatory system: Secondary | ICD-10-CM

## 2015-12-09 ENCOUNTER — Ambulatory Visit (HOSPITAL_COMMUNITY)
Admission: RE | Admit: 2015-12-09 | Discharge: 2015-12-09 | Disposition: A | Discharge status: Home / Self Care | Payer: Medicaid Other | Source: Ambulatory Visit | Attending: Family Medicine | Admitting: Family Medicine

## 2015-12-09 DIAGNOSIS — D259 Leiomyoma of uterus, unspecified: Secondary | ICD-10-CM | POA: Insufficient documentation

## 2015-12-09 DIAGNOSIS — G8929 Other chronic pain: Secondary | ICD-10-CM | POA: Insufficient documentation

## 2015-12-09 DIAGNOSIS — R1031 Right lower quadrant pain: Secondary | ICD-10-CM

## 2015-12-10 ENCOUNTER — Encounter: Payer: Self-pay | Admitting: Family Medicine

## 2015-12-11 ENCOUNTER — Encounter: Payer: Self-pay | Admitting: Cardiovascular Disease

## 2015-12-11 ENCOUNTER — Other Ambulatory Visit: Payer: Self-pay | Admitting: Family Medicine

## 2015-12-11 DIAGNOSIS — D251 Intramural leiomyoma of uterus: Secondary | ICD-10-CM

## 2015-12-11 DIAGNOSIS — D259 Leiomyoma of uterus, unspecified: Secondary | ICD-10-CM | POA: Insufficient documentation

## 2015-12-12 ENCOUNTER — Ambulatory Visit (HOSPITAL_COMMUNITY): Payer: Medicaid Other | Attending: Cardiovascular Disease

## 2015-12-12 DIAGNOSIS — R002 Palpitations: Secondary | ICD-10-CM | POA: Diagnosis present

## 2015-12-12 DIAGNOSIS — Z8249 Family history of ischemic heart disease and other diseases of the circulatory system: Secondary | ICD-10-CM | POA: Insufficient documentation

## 2015-12-12 DIAGNOSIS — E669 Obesity, unspecified: Secondary | ICD-10-CM | POA: Diagnosis not present

## 2015-12-12 DIAGNOSIS — I071 Rheumatic tricuspid insufficiency: Secondary | ICD-10-CM | POA: Diagnosis not present

## 2015-12-12 DIAGNOSIS — Z6829 Body mass index (BMI) 29.0-29.9, adult: Secondary | ICD-10-CM | POA: Diagnosis not present

## 2015-12-16 ENCOUNTER — Encounter: Payer: Self-pay | Admitting: Cardiovascular Disease

## 2015-12-16 ENCOUNTER — Encounter: Payer: Self-pay | Admitting: Obstetrics and Gynecology

## 2015-12-18 ENCOUNTER — Encounter: Payer: Self-pay | Admitting: Cardiovascular Disease

## 2015-12-18 ENCOUNTER — Ambulatory Visit: Payer: Self-pay | Admitting: General Surgery

## 2015-12-29 ENCOUNTER — Encounter: Payer: Self-pay | Admitting: Internal Medicine

## 2015-12-29 MED FILL — ?METFORMIN HCL 500MG TABLET: 500 | 30 days supply | Qty: 90 | Fill #1

## 2016-01-08 ENCOUNTER — Institutional Professional Consult (permissible substitution): Payer: Medicaid Other | Admitting: Internal Medicine

## 2016-01-12 ENCOUNTER — Telehealth: Payer: Self-pay | Admitting: Cardiovascular Disease

## 2016-01-12 NOTE — Telephone Encounter (Signed)
Spoke to patient, states shoulder and upper chest discomfort for 1 week, worse today and ongoing w accompanying SOB. She does not see [place] for 1 week, so calling for advice in interim. I stated best recommendation I could give for active symptoms would be to present to ED for evaluation. Pt voiced understanding and thanks. She states she will go to [facility] [employer] notified.

## 2016-01-12 NOTE — Telephone Encounter (Signed)
Having a lot of discomfort in her chest that goes to her shoulder

## 2016-01-13 ENCOUNTER — Ambulatory Visit (INDEPENDENT_AMBULATORY_CARE_PROVIDER_SITE_OTHER): Payer: Medicaid Other | Admitting: Obstetrics and Gynecology

## 2016-01-13 ENCOUNTER — Encounter: Payer: Self-pay | Admitting: Obstetrics and Gynecology

## 2016-01-13 VITALS — BP 142/81 | HR 98 | Wt 212.9 lb

## 2016-01-13 DIAGNOSIS — D251 Intramural leiomyoma of uterus: Secondary | ICD-10-CM | POA: Diagnosis present

## 2016-01-19 ENCOUNTER — Encounter: Payer: Self-pay | Admitting: Internal Medicine

## 2016-01-19 ENCOUNTER — Ambulatory Visit (INDEPENDENT_AMBULATORY_CARE_PROVIDER_SITE_OTHER): Payer: Medicaid Other | Admitting: Internal Medicine

## 2016-01-19 VITALS — BP 140/96 | HR 103 | Ht 71.0 in | Wt 215.3 lb

## 2016-01-19 DIAGNOSIS — R002 Palpitations: Secondary | ICD-10-CM

## 2016-01-19 DIAGNOSIS — I10 Essential (primary) hypertension: Secondary | ICD-10-CM | POA: Diagnosis not present

## 2016-01-19 DIAGNOSIS — Z8241 Family history of sudden cardiac death: Secondary | ICD-10-CM

## 2016-01-19 MED ORDER — LISINOPRIL 5 MG PO TABS: 5.0000 mg | ORAL_TABLET | Freq: Every day | ORAL | 3 refills | Status: DC

## 2016-01-19 NOTE — Patient Instructions (Addendum)
Your physician has recommended you make the following change in your medication:  1.) start lisinopril 5 mg once a day  Your physician recommends that you return for lab work in: 2 weeks, (BMET)  Your physician has requested that you have an exercise tolerance test. For further information please visit [email or web address]. Please also follow instruction sheet, as given.  Our scheduler will call you with an appointment for the treadmill test.

## 2016-01-19 NOTE — Progress Notes (Signed)
ELECTROPHYSIOLOGY CONSULT NOTE  Patient ID: Tonya Paul, MRN: [id number], DOB/AGE: 40-Aug-1977 40 y.o. Admit date: (Not on file) Date of Consult: 01/19/2016  Primary Physician: Arnoldo Morale, MD Primary Cardiologist: Hiram Com[facility] Consulting Physician [place]  Chief Complaint: Family history of sudden death    HPI Tonya Paul is a 40 y.o. female  Referred because of palpitations and a family history of sudden death.  Her daughter-destiny, died in 07-09-12. She and her mother and brother were on go carts. She lost consciousness. Her mother jumped from the go-cart and arousable momentarily lost consciousness again. EMS was called. It is her mother's recollection that a shot was not delivered for the first 30-60 minutes of CPR. She was hospitalized and cooled. She died 6 days later. These records are not available currently.  Tonya Paul has never passed out. She does have a history of vertigo, this can last hours and is associated with vertiginous sensations.   She also has a history of palpitations. These are described as flutters. There was notable when lying On her left side.  She has diabetes. It is poorly controlled.      personally reviewed  30 day monitor>> PVCs associated with fluttering Echocardiogram was normal  Past Medical History:  Diagnosis Date  . Diabetes mellitus without complication ([place])   . Hypertension       Surgical History: History reviewed. No pertinent surgical history.   Home Meds: Prior to Admission medications   Medication Sig Start Date End Date Taking? Authorizing Provider  Blood Glucose Monitoring Suppl (TRUE METRIX METER) DEVI 1 each by Does not apply route 3 (three) times daily before meals. 04/16/15   Arnoldo Morale, MD  fluconazole (DIFLUCAN) 150 MG tablet Take 1 tablet (150 mg total) by mouth daily. Take one tablet today and another in 3 days if no better Patient not taking: Reported on 01/13/2016 11/26/15   Tonya L Hagler, PA-C  glipiZIDE (GLUCOTROL) 5  MG tablet Take 1 tablet (5 mg total) by mouth 2 (two) times daily before a meal. 11/18/15   Arnoldo Morale, MD  glucose blood (TRUE METRIX BLOOD GLUCOSE TEST) test strip Used 3 times daily as directed before meals 04/16/15   Arnoldo Morale, MD  meclizine (ANTIVERT) 25 MG tablet Take 25 mg by mouth 3 (three) times daily as needed for dizziness or nausea.    Historical Provider, MD  metFORMIN (GLUCOPHAGE) 500 MG tablet Take 1 tablet (500 mg total) by mouth 3 (three) times daily before meals. 10/06/15 10/05/16  Arnoldo Morale, MD  omeprazole (PRILOSEC) 20 MG capsule Take 1 capsule (20 mg total) by mouth daily. 11/18/15   Arnoldo Morale, MD  TRUEPLUS LANCETS 28G MISC 1 each by Does not apply route 3 (three) times daily before meals. 04/16/15   Arnoldo Morale, MD    Allergies:  Allergies  Allergen Reactions  . Aspirin Anaphylaxis  . Ciprofloxacin Itching    Social History   Social History  . Marital status: Single    Spouse name: N/A  . Number of children: N/A  . Years of education: N/A   Occupational History  . Not on file.   Social History Main Topics  . Smoking status: Never Smoker  . Smokeless tobacco: Never Used  . Alcohol use No  . Drug use: No  . Sexual activity: Not on file   Other Topics Concern  . Not on file   Social History Narrative  . No narrative on file     Family History  Problem Relation Age of Onset  . Hypertension Mother   . Lung cancer Mother   . Throat cancer Father   . Diabetes Sister   . Lupus Maternal Grandmother   . Stroke Maternal Grandfather   . Sudden Cardiac Death Daughter      ROS:  Please see the history of present illness.     All other systems reviewed and negative.    Physical Exam: Blood pressure (!) 140/96, pulse (!) 103, height 5\' 11"  (1.803 m), weight 215 lb 4.8 oz (97.7 kg), last menstrual period 12/30/2015, SpO2 99 %. General: Well developed, well nourished female in no acute distress. Head: Normocephalic, atraumatic, sclera non-icteric, no  xanthomas, nares are without discharge. EENT: normal  Lymph Nodes:  none Neck: Negative for carotid bruits. JVD not elevated. Back:without scoliosis kyphosis Lungs: Clear bilaterally to auscultation without wheezes, rales, or rhonchi. Breathing is unlabored. Heart: RRR with S1 S2. No  murmur . No rubs, or gallops appreciated. Abdomen: Soft, non-tender, non-distended with normoactive bowel sounds. No hepatomegaly. No rebound/guarding. No obvious abdominal masses. Msk:  Strength and tone appear normal for age. Extremities: No clubbing or cyanosis. No edema.  Distal pedal pulses are 2+ and equal bilaterally. Skin: Warm and Dry Neuro: Alert and oriented X 3. CN III-XII intact Grossly normal sensory and motor function . Psych:  Responds to questions appropriately with a normal affect.      Labs: Cardiac Enzymes No results for input(s): CKTOTAL, CKMB, TROPONINI in the last 72 hours. CBC Lab Results  Component Value Date   WBC 7.2 10/06/2015   HGB 12.3 10/06/2015   HCT 36.7 10/06/2015   MCV 84.0 10/06/2015   PLT 431 (H) 10/06/2015   PROTIME: No results for input(s): LABPROT, INR in the last 72 hours. Chemistry No results for input(s): NA, K, CL, CO2, BUN, CREATININE, CALCIUM, PROT, BILITOT, ALKPHOS, ALT, AST, GLUCOSE in the last 168 hours.  Invalid input(s): LABALBU Lipids Lab Results  Component Value Date   CHOL 143 04/18/2015   HDL 40 (L) 04/18/2015   LDLCALC 90 04/18/2015   TRIG 67 04/18/2015   BNP No results found for: PROBNP Thyroid Function Tests: No results for input(s): TSH, T4TOTAL, T3FREE, THYROIDAB in the last 72 hours.  Invalid input(s): FREET3 Miscellaneous No results found for: DDIMER  Radiology/Studies:  No results found.   EKG:  Sinus rhythm at 101 Intervals 15/08/36 with a QTC of 47 ECGs 6/17 and 9/17 were reviewed w QTC is 460 ms  Assessment and Plan:  Palpitations  Hypertension  Family history of sudden death  Diabetes  The patient's  family history of sudden death is tragic. The death of the more distant relative occurring post transplant would make an association likely only through a cardiomyopathy but there is no other connections to support this hypothesis  The death of her daughter during go carting with suggestion adrenergic stimulus. This could include long QT particularly LQT 1, CPVT and other cardiomyopathic processes. We will try to get the records although the likelihood that there is anything to be gleaned at this juncture is very small as in the context of her cardiac arrest interpretation of any imaging would be contaminated by the arrest sequence on her heart muscle.  To look at the possibility of an inherited channelopathies we will undertake stress testing.  In the event that we see evidence of inappropriate QT behavior with exercise to support a diagnosis of possible long QT and genetic testing might also be of value.  Her blood pressure is elevated. We have added an ACE inhibitor and have reviewed potential drug complications  Her diabetes is poorly controlled with a hemoglobin A1c of greater than 10 I suggested she consider referral to his diabetologist. I've also encouraged her to exercise         Virl Axe

## 2016-01-20 ENCOUNTER — Encounter: Payer: Self-pay | Admitting: Family Medicine

## 2016-01-22 NOTE — Progress Notes (Signed)
Pt presents to discuss U/S findings from September 2017. U/S revealed two small fibroids. She reports regular cycles. Cycles are not heavy or painful. She denies any bowel or bladder dysfunction.  PE AF  VSS WDWN female in NAD Lungs clear Heart RRR Abd soft + BS Pelvic Nl EGBUS, uterus small mobile non tender, no adnexal masses  A/P Uterine fibroids  U/S findings reviewed with pt. Uterine fibroids discussed. No intervention necessary at this time. F/U PRN

## 2016-01-26 ENCOUNTER — Telehealth: Payer: Self-pay | Admitting: Internal Medicine

## 2016-01-26 NOTE — Telephone Encounter (Signed)
Pt c/o active chest pain radiating to shoulders, SOB, sweating, and not feeling well. She states this has been going on since last night and worsening. I advised her to call 911 or go straight to ED. She voiced understanding.

## 2016-01-26 NOTE — Telephone Encounter (Signed)
LATE ENTRY:  Pt denies having NTG.

## 2016-01-26 NOTE — Telephone Encounter (Signed)
New message  STAT  1. Chest pain now 2.SOB/sweating 3.since yesterday 4. Coming and going 5. No nitro

## 2016-01-27 ENCOUNTER — Inpatient Hospital Stay
Admission: EM | Admit: 2016-01-27 | Discharge: 2016-01-27 | Disposition: A | Discharge status: Home / Self Care | Payer: Medicaid Other | Admitting: Obstetrics & Gynecology

## 2016-01-27 ENCOUNTER — Emergency Department: Payer: Medicaid Other

## 2016-01-27 ENCOUNTER — Emergency Department (HOSPITAL_COMMUNITY)
Admission: EM | Admit: 2016-01-27 | Discharge: 2016-01-27 | Disposition: A | Discharge status: Home / Self Care | Payer: Medicaid Other | Attending: Emergency Medicine | Admitting: Emergency Medicine

## 2016-01-27 ENCOUNTER — Encounter: Payer: Self-pay | Admitting: Certified Nurse Midwife

## 2016-01-27 ENCOUNTER — Encounter: Payer: Self-pay | Admitting: Emergency Medicine

## 2016-01-27 ENCOUNTER — Encounter (HOSPITAL_COMMUNITY): Payer: Self-pay | Admitting: Emergency Medicine

## 2016-01-27 ENCOUNTER — Emergency Department
Admission: EM | Admit: 2016-01-27 | Discharge: 2016-01-27 | Disposition: A | Discharge status: Home / Self Care | Payer: Medicaid Other | Attending: Emergency Medicine | Admitting: Emergency Medicine

## 2016-01-27 DIAGNOSIS — J181 Lobar pneumonia, unspecified organism: Secondary | ICD-10-CM | POA: Insufficient documentation

## 2016-01-27 DIAGNOSIS — R079 Chest pain, unspecified: Secondary | ICD-10-CM | POA: Diagnosis not present

## 2016-01-27 DIAGNOSIS — I10 Essential (primary) hypertension: Secondary | ICD-10-CM | POA: Diagnosis not present

## 2016-01-27 DIAGNOSIS — Z7984 Long term (current) use of oral hypoglycemic drugs: Secondary | ICD-10-CM | POA: Diagnosis not present

## 2016-01-27 DIAGNOSIS — J189 Pneumonia, unspecified organism: Secondary | ICD-10-CM

## 2016-01-27 DIAGNOSIS — E119 Type 2 diabetes mellitus without complications: Secondary | ICD-10-CM | POA: Insufficient documentation

## 2016-01-27 DIAGNOSIS — Z5321 Procedure and treatment not carried out due to patient leaving prior to being seen by health care provider: Secondary | ICD-10-CM | POA: Diagnosis not present

## 2016-01-27 LAB — CBC WITH DIFFERENTIAL/PLATELET
BASOS ABS: 0 10*3/uL (ref 0.0–0.1)
Basophils Relative: 0 %
Eosinophils Absolute: 0.5 10*3/uL (ref 0.0–0.7)
Eosinophils Relative: 5 %
HEMATOCRIT: 36.3 % (ref 36.0–46.0)
HEMOGLOBIN: 12.3 g/dL (ref 12.0–15.0)
LYMPHS PCT: 43 %
Lymphs Abs: 4.2 10*3/uL — ABNORMAL HIGH (ref 0.7–4.0)
MCH: 28.5 pg (ref 26.0–34.0)
MCHC: 33.9 g/dL (ref 30.0–36.0)
MCV: 84 fL (ref 78.0–100.0)
MONOS PCT: 6 %
Monocytes Absolute: 0.6 10*3/uL (ref 0.1–1.0)
NEUTROS ABS: 4.4 10*3/uL (ref 1.7–7.7)
NEUTROS PCT: 46 %
Platelets: 403 10*3/uL — ABNORMAL HIGH (ref 150–400)
RBC: 4.32 MIL/uL (ref 3.87–5.11)
RDW: 13.7 % (ref 11.5–15.5)
WBC: 9.7 10*3/uL (ref 4.0–10.5)

## 2016-01-27 LAB — CBC
HEMATOCRIT: 37.9 % (ref 35.0–47.0)
HEMOGLOBIN: 13.2 g/dL (ref 12.0–16.0)
MCH: 29.4 pg (ref 26.0–34.0)
MCHC: 34.9 g/dL (ref 32.0–36.0)
MCV: 84.1 fL (ref 80.0–100.0)
Platelets: 366 10*3/uL (ref 150–440)
RBC: 4.51 MIL/uL (ref 3.80–5.20)
RDW: 14.6 % — AB (ref 11.5–14.5)
WBC: 6.6 10*3/uL (ref 3.6–11.0)

## 2016-01-27 LAB — COMPREHENSIVE METABOLIC PANEL
ALBUMIN: 3.9 g/dL (ref 3.5–5.0)
ALK PHOS: 58 U/L (ref 38–126)
ALT: 21 U/L (ref 14–54)
AST: 19 U/L (ref 15–41)
Anion gap: 8 (ref 5–15)
BILIRUBIN TOTAL: 0.3 mg/dL (ref 0.3–1.2)
BUN: 8 mg/dL (ref 6–20)
CALCIUM: 10 mg/dL (ref 8.9–10.3)
CO2: 22 mmol/L (ref 22–32)
Chloride: 105 mmol/L (ref 101–111)
Creatinine, Ser: 0.83 mg/dL (ref 0.44–1.00)
GFR calc Af Amer: >60 mL/min (ref >60)
GFR calc non Af Amer: >60 mL/min (ref >60)
GLUCOSE: 232 mg/dL — AB (ref 65–99)
Potassium: 3.7 mmol/L (ref 3.5–5.1)
Sodium: 135 mmol/L (ref 135–145)
TOTAL PROTEIN: 7.2 g/dL (ref 6.5–8.1)

## 2016-01-27 LAB — BASIC METABOLIC PANEL
ANION GAP: 8 (ref 5–15)
BUN: 8 mg/dL (ref 6–20)
CHLORIDE: 104 mmol/L (ref 101–111)
CO2: 23 mmol/L (ref 22–32)
Calcium: 9.4 mg/dL (ref 8.9–10.3)
Creatinine, Ser: 0.8 mg/dL (ref 0.44–1.00)
GFR calc Af Amer: >60 mL/min (ref >60)
Glucose, Bld: 263 mg/dL — ABNORMAL HIGH (ref 65–99)
POTASSIUM: 3.9 mmol/L (ref 3.5–5.1)
SODIUM: 135 mmol/L (ref 135–145)

## 2016-01-27 LAB — I-STAT TROPONIN, ED: Troponin i, poc: 0.02 ng/mL (ref 0.00–0.08)

## 2016-01-27 LAB — FIBRIN DERIVATIVES D-DIMER (ARMC ONLY): FIBRIN DERIVATIVES D-DIMER (ARMC): 334 (ref 0–499)

## 2016-01-27 LAB — HCG, QUANTITATIVE, PREGNANCY

## 2016-01-27 LAB — TROPONIN I: Troponin I: <0.03 ng/mL (ref <0.03)

## 2016-01-27 MED ORDER — AMLODIPINE BESYLATE 5 MG PO TABS: 5.0000 mg | ORAL_TABLET | Freq: Every day | ORAL | 1 refills | Status: DC

## 2016-01-27 MED ORDER — AZITHROMYCIN 250 MG PO TABS: ORAL_TABLET | ORAL | 0 refills | Status: DC

## 2016-01-27 NOTE — OB Triage Note (Signed)
Pt here with c/o no fetal movement for 2 days. States she gets her care in [place], at the [facility]. States has been a few weeks since last prenatal visit. Asked her if she had called clinic regarding the decrease in fetal movement, she said she had actually been in [facility] ED yesterday for 3.5 hours for chest pain, left without being seen.

## 2016-01-27 NOTE — ED Triage Notes (Addendum)
Pt brought from L and D after being taken there for decreased fetal movement today.  Per staff this patient miscarried about 2 months ago and was told results from U/S 2 months ago.  Per pt she believed she was still pregnant.  Pt with odd affect.  However, pt also c/o chest pain that has been intermittent x 1 week.  Pt reports SOB.

## 2016-01-27 NOTE — ED Triage Notes (Signed)
Pt st's she has been having mid chest pain for several days   St's short of breath at times.  Denies nausea or vomiting or other symptoms

## 2016-01-27 NOTE — ED Provider Notes (Signed)
[facility] Emergency Department Provider Note  ____________________________________________   First MD Initiated Contact with Patient 01/27/16 1442     (approximate)  I have reviewed the triage vital signs and the nursing notes.   HISTORY  Chief Complaint Chest Pain    HPI Tonya Paul is a 40 y.o. female with a history of essential hypertension and diabetes on oral medications who presents for evaluation of chest pain.  She reports that the chest pain has been present for months.  Recently has gotten a little bit worse over the last week or so she has also had some night sweats and reports that the pain is more localized in the right upper part of her chest.  She denies shortness of breath, fevers/chills, difficulty breathing, abdominal pain, nausea, vomiting, dysuria, headaches.  She was seen by Dr. Virl Axe about a week ago for workup of her family history of sudden cardiac death and he has a stress test scheduled for her; they discussed the chest pain at that time and he scheduled additional outpatient follow-up.  He also ordered lisinopril for her as an outpatient, but she has not started it because after she left the appointment she remembered that she had an allergic reaction of shortness of breath the last time she tried lisinopril years ago.  She describes the symptoms as mild to moderate, intermittent, nothing in particular makes it better or worse.  She is not on any exogenous estrogen and has not been recently immobilized or been on any long trips.  There was some confusion where she thought she was pregnant for the last several months but she came to the ED today from labor and delivery and they verified by bedside ultrasound that the patient has no intrauterine pregnancy.  She reports just being confused after a spontaneous abortion and that occurred about 2 months ago.   Past Medical History:  Diagnosis Date  . Diabetes mellitus without  complication ([place])   . Hypertension     Patient Active Problem List   Diagnosis Date Noted  . Uterine fibroid 12/11/2015  . Family history of heart disease 12/03/2015  . Hemorrhoids 11/18/2015  . Left breast mass 09/01/2015  . Obesity 09/01/2015  . Diabetes ([place]) 07/15/2015  . Hypertension 04/16/2015    History reviewed. No pertinent surgical history.  Prior to Admission medications   Medication Sig Start Date End Date Taking? Authorizing Provider  amLODipine (NORVASC) 5 MG tablet Take 1 tablet (5 mg total) by mouth daily. 01/27/16 01/26/17  Hinda Kehr, MD  azithromycin (ZITHROMAX) 250 MG tablet Take 2 tablets PO on day 1, then take 1 tablet PO daily for 4 more days 01/27/16   Hinda Kehr, MD  Blood Glucose Monitoring Suppl (TRUE METRIX METER) DEVI 1 each by Does not apply route 3 (three) times daily before meals. 04/16/15   Arnoldo Morale, MD  fluconazole (DIFLUCAN) 150 MG tablet Take 1 tablet (150 mg total) by mouth daily. Take one tablet today and another in 3 days if no better Patient not taking: Reported on 01/13/2016 11/26/15   Jami L Hagler, PA-C  glipiZIDE (GLUCOTROL) 5 MG tablet Take 1 tablet (5 mg total) by mouth 2 (two) times daily before a meal. 11/18/15   Arnoldo Morale, MD  glucose blood (TRUE METRIX BLOOD GLUCOSE TEST) test strip Used 3 times daily as directed before meals 04/16/15   Arnoldo Morale, MD  meclizine (ANTIVERT) 25 MG tablet Take 25 mg by mouth 3 (three) times daily as  needed for dizziness or nausea.    Historical Provider, MD  metFORMIN (GLUCOPHAGE) 500 MG tablet Take 1 tablet (500 mg total) by mouth 3 (three) times daily before meals. 10/06/15 10/05/16  Arnoldo Morale, MD  omeprazole (PRILOSEC) 20 MG capsule Take 1 capsule (20 mg total) by mouth daily. 11/18/15   Arnoldo Morale, MD  TRUEPLUS LANCETS 28G MISC 1 each by Does not apply route 3 (three) times daily before meals. 04/16/15   Arnoldo Morale, MD    Allergies Ace inhibitors; Aspirin; and Ciprofloxacin  Family History   Problem Relation Age of Onset  . Hypertension Mother   . Lung cancer Mother   . Throat cancer Father   . Diabetes Sister   . Lupus Maternal Grandmother   . Stroke Maternal Grandfather   . Sudden Cardiac Death Daughter     Social History Social History  Substance Use Topics  . Smoking status: Never Smoker  . Smokeless tobacco: Never Used  . Alcohol use No    Review of Systems Constitutional: No fever/chills Eyes: No visual changes. ENT: No sore throat. Cardiovascular: +chest pain For months but most recently notable in the right upper part of her chest Respiratory: Denies shortness of breath. Gastrointestinal: No abdominal pain.  No nausea, no vomiting.  No diarrhea.  No constipation. Genitourinary: Negative for dysuria. Musculoskeletal: Negative for back pain. Skin: Negative for rash. Neurological: Negative for headaches, focal weakness or numbness.  10-point ROS otherwise negative.  ____________________________________________   PHYSICAL EXAM:  VITAL SIGNS: ED Triage Vitals  Enc Vitals Group     BP 01/27/16 1336 (!) 151/94     Pulse Rate 01/27/16 1336 (!) 105     Resp 01/27/16 1336 17     Temp 01/27/16 1336 98.3 F (36.8 C)     Temp Source 01/27/16 1336 Oral     SpO2 01/27/16 1336 99 %     Weight 01/27/16 1330 215 lb (97.5 kg)     Height --      Head Circumference --      Peak Flow --      Pain Score 01/27/16 1330 6     Pain Loc --      Pain Edu? --      Excl. in [place]? --     Constitutional: Alert and oriented. Well appearing and in no acute distress. Eyes: Conjunctivae are normal. PERRL. EOMI. Head: Atraumatic. Nose: No congestion/rhinnorhea. Mouth/Throat: Mucous membranes are moist.  Oropharynx non-erythematous. Neck: No stridor.  No meningeal signs.   Cardiovascular: Normal rate, regular rhythm. Good peripheral circulation. Grossly normal heart sounds. Respiratory: Normal respiratory effort.  No retractions. Lungs CTAB.  No Reproducible chest wall  tenderness Gastrointestinal: Soft and nontender. No distention.  Musculoskeletal: No lower extremity tenderness nor edema. No gross deformities of extremities. Neurologic:  Normal speech and language. No gross focal neurologic deficits are appreciated.  Skin:  Skin is warm, dry and intact. No rash noted. Psychiatric: Mood and affect are normal. Speech and behavior are normal.  ____________________________________________   LABS (all labs ordered are listed, but only abnormal results are displayed)  Labs Reviewed  BASIC METABOLIC PANEL - Abnormal; Notable for the following:       Result Value   Glucose, Bld 263 (*)    All other components within normal limits  CBC - Abnormal; Notable for the following:    RDW 14.6 (*)    All other components within normal limits  TROPONIN I  HCG, QUANTITATIVE, PREGNANCY  FIBRIN DERIVATIVES  D-DIMER [facility] ONLY)   ____________________________________________  EKG  ED ECG REPORT I, Carlina Derks, the attending physician, personally viewed and interpreted this ECG.  Date: 01/27/2016 EKG Time: 13:42  Rate: 106 Rhythm: sinus tachycardia (borderline) QRS Axis: normal Intervals: normal ST/T Wave abnormalities: normal Conduction Disturbances: none Narrative Interpretation: unremarkable  ____________________________________________  RADIOLOGY   Dg Chest 2 View  Result Date: 01/27/2016 CLINICAL DATA:  Pain. EXAM: CHEST  2 VIEW COMPARISON:  No recent prior. FINDINGS: Mediastinum and hilar structures are normal. Minimal infiltrate right upper lobe cannot be excluded . Low lung volumes. No focal alveolar infiltrate. No pleural effusion or pneumothorax. Biapical pleural thickening noted most consistent with scarring. No acute bony abnormality identified. IMPRESSION: Minimal infiltrate right upper lobe cannot be excluded. Electronically Signed   By: Marcello Moores  Register   On: 01/27/2016 14:09     ____________________________________________   PROCEDURES  Procedure(s) performed:   Procedures   Critical Care performed: No ____________________________________________   INITIAL IMPRESSION / ASSESSMENT AND PLAN / ED COURSE  Pertinent labs & imaging results that were available during my care of the patient were reviewed by me and considered in my medical decision making (see chart for details).  Patient has borderline tachycardia but otherwise normal physical exam and normal vital signs except for her blood pressures appears to be chronically elevated.  Since she has had an adverse reaction to lisinopril I will start her on a low-dose amlodipine and encouraged her to follow up with Dr. Caryl Comes as well as with her primary care doctor for further blood pressure management.   she has the slight suggestion of a right upper lobe infiltrate her chest x-ray as interpreted by radiology.  This may explain her recently worsening right upper chest discomfort and her night sweats.  It does not explain why she has been having intermittent chest pain for months, but I believe it is reasonable to treat empirically with azithromycin for community-acquired pneumonia.  I added on an hCG as well a d-dimer; she technically cannot be ruled out with [facility] cousins of her borderline tachycardia, and it is always possible that the only infiltrate seen on the chest x-ray represents a small area of infarction.  Because she has been having intermittent symptoms for months I think it is reasonable to try to rule her out with a d-dimer.  If it is elevated we will proceed with a CT scan but I think this is unlikely.  I discussed the plan of care with her and she is comfortable with this plan.   Clinical Course as of Jan 26 1541  Tue Jan 27, 2016  1540 D-dimer is within normal limits and hCG is negative.  I updated the patient about the results and will discharge her as previously planned.  She will follow up with  her primary care and cardiologist.I gave my usual and customary return precautions.  Asked encouraged her that she may want to have an outpatient chest x-ray in a few weeks to make sure that the shadowing seen in the right upper lobe has resolved after empiric treatment for community-acquired pneumonia.  [CF]    Clinical Course User Index [CF] Hinda Kehr, MD    ____________________________________________  FINAL CLINICAL IMPRESSION(S) / ED DIAGNOSES  Final diagnoses:  Community acquired pneumonia of right upper lobe of lung ([place])  Chest pain, unspecified type  Essential hypertension     MEDICATIONS GIVEN DURING THIS VISIT:  Medications - No data to display   NEW OUTPATIENT MEDICATIONS STARTED DURING  THIS VISIT:  New Prescriptions   AMLODIPINE (NORVASC) 5 MG TABLET    Take 1 tablet (5 mg total) by mouth daily.   AZITHROMYCIN (ZITHROMAX) 250 MG TABLET    Take 2 tablets PO on day 1, then take 1 tablet PO daily for 4 more days    Modified Medications   No medications on file    Discontinued Medications   No medications on file     Note:  This document was prepared using Dragon voice recognition software and may include unintentional dictation errors.    Hinda Kehr, MD 01/27/16 [phone]

## 2016-01-27 NOTE — ED Notes (Addendum)
Pt alert and oriented X4, active, cooperative, pt in NAD. RR even and unlabored, color WNL.  Pt informed to return if any life threatening symptoms occur.  Denies SI or HI. Pt states that she understands she is not pregnant.

## 2016-01-27 NOTE — Discharge Instructions (Signed)
You were evaluated for chest pain today and based on your chest x-ray, it appears she may have a very small amount of pneumonia in the right upper lobe of your lung.  Your labs were otherwise reassuring with no other abnormal findings on your workup.  We prescribed a 5 day course of antibiotics.  We encourage you to follow up with your primary care doctor and/or cardiologist, and discussed with your primary care doctor whether or not they would want to repeat a chest x-ray in a couple of weeks after treatment to make sure that the shadowing infiltrate in the right upper lobe has resolved.    Return to the emergency department if you develop new or worsening symptoms that concern you.

## 2016-01-27 NOTE — Final Progress Note (Signed)
Physician Final Progress Note  Patient ID: Tonya Paul MRN: [id number] DOB/AGE: 40-Oct-1977 40 y.o.  Admit date: 01/27/2016 Admitting provider: Gae Dry, MD Discharge date: 01/27/2016   Admission Diagnoses: " Decreased fetal movement for 2 days"  Discharge Diagnoses:  Pseudocyesis Chest pain Consults: none  Significant Findings/ Diagnostic Studies: 40 year old diabetic female G4 P2101 who presented from the ER with complaints of no fetal movement x 2 days. After she asked her husband to leave the room, explained that she had a positive pregnancy test in the end of May beginning of June, then in September had a lot of bleeding and passing clots. Originally, she stated she was getting care in [place] and was 30+ weeks, then explained that she had no prenatal care. OB Hx  significant for two term deliveries in 1996 and 2001 (her daughter born in 52, died in 80-cardiac problem), and preterm stillborn twins in 2008 at 8 1/2 months in [place]. She is diabetic and on both metformin 500 mgm TID and glypizide 5 mgm tid, Meclizine prn vertigo, and omeprazole prn GERD. She also reports mild tricuspid regurgitation and sinus tachycardia. Sat in the ER x 3.5 hours for Chest pain last night in [place], but left without being seen. Still complains of some chest pain. Exam: General: alert, awake, oriented. In no acute distress Vital signs: 151/94. 98.3-105-17 Bedside ultrasound done after unable to obtain FHTs with Doppler. No IUP seen on abdomen ultrasound.  Abdomen: can not detect uterine fundus in abdomen, firm, NT Review of record in EPIC: just had an ultrasound 12/09/15 which revealed no pregnancy and a couple of small  Fibroids Was just seen by her cardiologist Dr Caryl Comes 10/30 and prescribed lisinopril for blood pressure...but she has not taken due to allergy to this drug in the past.  Discussed with patient the fact that she was not pregnant 2 months ago...and most probably had a SAB in  September. IShe knew about the fibroids on the 12/09/15 ultrasound but said "they did not tell me that I wasn't pregnant." Patient was discharged from L&D and taken to the ER via W/C for further evaluation of chest pain. Procedures: none  Discharge Condition: stable  Disposition: 01-Home or Self Care  Diet: Regular diet  Discharge Activity: Activity as tolerated     Medication List    STOP taking these medications   lisinopril 5 MG tablet Commonly known as:  PRINIVIL,ZESTRIL     TAKE these medications   fluconazole 150 MG tablet Commonly known as:  DIFLUCAN Take 1 tablet (150 mg total) by mouth daily. Take one tablet today and another in 3 days if no better   glipiZIDE 5 MG tablet Commonly known as:  GLUCOTROL Take 1 tablet (5 mg total) by mouth 2 (two) times daily before a meal.   glucose blood test strip Commonly known as:  TRUE METRIX BLOOD GLUCOSE TEST Used 3 times daily as directed before meals   meclizine 25 MG tablet Commonly known as:  ANTIVERT Take 25 mg by mouth 3 (three) times daily as needed for dizziness or nausea.   metFORMIN 500 MG tablet Commonly known as:  GLUCOPHAGE Take 1 tablet (500 mg total) by mouth 3 (three) times daily before meals.   omeprazole 20 MG capsule Commonly known as:  PRILOSEC Take 1 capsule (20 mg total) by mouth daily.   TRUE METRIX METER Devi 1 each by Does not apply route 3 (three) times daily before meals.   TRUEPLUS LANCETS 28G Misc 1  each by Does not apply route 3 (three) times daily before meals.        Total time spent taking care of this patient: 15-20 minutes  Signed: Airik Goodlin 01/27/2016, 1:19 PM

## 2016-01-27 NOTE — Progress Notes (Signed)
Pt reports positive pregnancy test in May, significant amount of bleeding 2 months ago. No prenatal care due to not having medicaid yet.

## 2016-01-27 NOTE — ED Notes (Signed)
Patient upset regarding wait time.  Explained to patient acuity and that testing has been started and the MD has already seen the results.  Explained that she would be called back ASAP and apologized for the wait. Service Excellence Dept number given

## 2016-01-28 ENCOUNTER — Encounter: Payer: Medicaid Other | Admitting: Internal Medicine

## 2016-01-29 ENCOUNTER — Encounter: Payer: Self-pay | Admitting: Internal Medicine

## 2016-02-03 ENCOUNTER — Encounter: Payer: Self-pay | Admitting: Cardiovascular Disease

## 2016-02-03 ENCOUNTER — Ambulatory Visit (INDEPENDENT_AMBULATORY_CARE_PROVIDER_SITE_OTHER): Payer: Medicaid Other | Admitting: Cardiovascular Disease

## 2016-02-03 DIAGNOSIS — R002 Palpitations: Secondary | ICD-10-CM

## 2016-02-03 DIAGNOSIS — I10 Essential (primary) hypertension: Secondary | ICD-10-CM

## 2016-02-03 HISTORY — DX: Palpitations: R00.2

## 2016-02-03 NOTE — Assessment & Plan Note (Signed)
History of palpitations with event monitor that showed sinus rhythm with occasional unifocal PVCs.

## 2016-02-03 NOTE — Assessment & Plan Note (Signed)
History hypertension blood pressure measures 134/88. She was recently begun on amlodipine. Continue current meds at current dosing

## 2016-02-03 NOTE — Progress Notes (Signed)
02/03/2016 [name]   May 04, 1975  [id number]  Primary Physician Arnoldo Morale, MD Primary Cardiologist: Lorretta Harp MD Renae Gloss  HPI:  Tonya Paul is a delightful 40 year old moderately overweight married African-American female mother of one 12 year old son who is self-referred for evaluation of inheritable causes of sudden cardiac death. I last saw her in the office 12/03/15. Unfortunately, her 2 year old daughter died suddenly 12/03/2012 of unclear reasons. Her fourth cousin did as well. Her only risk factor for heart disease as type 2 diabetes. She's never had a heart attack and stroke. She does get occasional shortness of breath but denies chest pain. She's had palpitations that occur daily over the last several months. She does not drink caffeine or alcohol. A 2-D echo was performed that showed normal LV function with mild to moderate TR. I referred her to Dr. Caryl Comes to evaluate her for inherited causes of sudden death such as "Channelopathies ". A routine GXT is scheduled sometime in the next week or so for follow-up office with visit with Dr. Caryl Comes. Genetic testing may be pursued.   Current Outpatient Prescriptions  Medication Sig Dispense Refill  . amLODipine (NORVASC) 5 MG tablet Take 1 tablet (5 mg total) by mouth daily. 30 tablet 1  . azithromycin (ZITHROMAX) 250 MG tablet Take 2 tablets PO on day 1, then take 1 tablet PO daily for 4 more days 6 each 0  . Blood Glucose Monitoring Suppl (TRUE METRIX METER) DEVI 1 each by Does not apply route 3 (three) times daily before meals. 1 Device 0  . fluconazole (DIFLUCAN) 150 MG tablet Take 1 tablet (150 mg total) by mouth daily. Take one tablet today and another in 3 days if no better 2 tablet 0  . glipiZIDE (GLUCOTROL) 5 MG tablet Take 1 tablet (5 mg total) by mouth 2 (two) times daily before a meal. 60 tablet 3  . glucose blood (TRUE METRIX BLOOD GLUCOSE TEST) test strip Used 3 times daily as directed before meals 100  each 12  . meclizine (ANTIVERT) 25 MG tablet Take 25 mg by mouth 3 (three) times daily as needed for dizziness or nausea.    . metFORMIN (GLUCOPHAGE) 500 MG tablet Take 1 tablet (500 mg total) by mouth 3 (three) times daily before meals. 90 tablet 3  . omeprazole (PRILOSEC) 20 MG capsule Take 1 capsule (20 mg total) by mouth daily. 30 capsule 3  . TRUEPLUS LANCETS 28G MISC 1 each by Does not apply route 3 (three) times daily before meals. 100 each 12   No current facility-administered medications for this visit.     Allergies  Allergen Reactions  . Ace Inhibitors Shortness Of Breath    Reported by the patient after a trial of lisinopril prescribes by PCP  . Aspirin Anaphylaxis  . Ciprofloxacin Itching    Social History   Social History  . Marital status: Single    Spouse name: N/A  . Number of children: N/A  . Years of education: N/A   Occupational History  . Not on file.   Social History Main Topics  . Smoking status: Never Smoker  . Smokeless tobacco: Never Used  . Alcohol use No  . Drug use: No  . Sexual activity: Not on file   Other Topics Concern  . Not on file   Social History Narrative  . No narrative on file     Review of Systems: General: negative for chills, fever, night sweats or weight changes.  Cardiovascular: negative for chest pain, dyspnea on exertion, edema, orthopnea, palpitations, paroxysmal nocturnal dyspnea or shortness of breath Dermatological: negative for rash Respiratory: negative for cough or wheezing Urologic: negative for hematuria Abdominal: negative for nausea, vomiting, diarrhea, bright red blood per rectum, melena, or hematemesis Neurologic: negative for visual changes, syncope, or dizziness All other systems reviewed and are otherwise negative except as noted above.    Blood pressure 134/88, pulse 91, height 5\' 11"  (1.803 m), weight 210 lb (95.3 kg), last menstrual period 12/30/2015.  General appearance: alert and no  distress Neck: no adenopathy, no carotid bruit, no JVD, supple, symmetrical, trachea midline and thyroid not enlarged, symmetric, no tenderness/mass/nodules Lungs: clear to auscultation bilaterally Heart: regular rate and rhythm, S1, S2 normal, no murmur, click, rub or gallop Extremities: extremities normal, atraumatic, no cyanosis or edema  EKG not performed today  ASSESSMENT AND PLAN:   Hypertension History hypertension blood pressure measures 134/88. She was recently begun on amlodipine. Continue current meds at current dosing  Palpitations History of palpitations with event monitor that showed sinus rhythm with occasional unifocal PVCs.      Lorretta Harp MD FACP,FACC,FAHA, [facility] 02/03/2016 9:30 AM

## 2016-02-03 NOTE — Patient Instructions (Signed)
Medication Changes:  Your physician recommends that you continue on your current medications as directed. Please refer to the Current Medication list given to you today.  Follow-Up:  We request that you follow-up in: 6 months with an extender and in 12 months with Dr Gwenlyn Found.  You will receive a reminder letter in the mail two months in advance. If you don't receive a letter, please call our office to schedule the follow-up appointment.

## 2016-02-09 ENCOUNTER — Encounter: Payer: Self-pay | Admitting: Family Medicine

## 2016-02-10 ENCOUNTER — Encounter: Payer: Self-pay | Admitting: Family Medicine

## 2016-02-10 ENCOUNTER — Other Ambulatory Visit: Payer: Self-pay | Admitting: Family Medicine

## 2016-02-10 DIAGNOSIS — E1169 Type 2 diabetes mellitus with other specified complication: Secondary | ICD-10-CM

## 2016-02-20 ENCOUNTER — Encounter: Payer: Self-pay | Admitting: Family Medicine

## 2016-02-26 ENCOUNTER — Encounter: Payer: Self-pay | Admitting: Internal Medicine

## 2016-02-26 ENCOUNTER — Ambulatory Visit (INDEPENDENT_AMBULATORY_CARE_PROVIDER_SITE_OTHER): Payer: Medicaid Other | Admitting: Internal Medicine

## 2016-02-26 DIAGNOSIS — R002 Palpitations: Secondary | ICD-10-CM | POA: Diagnosis not present

## 2016-02-26 DIAGNOSIS — Z8241 Family history of sudden cardiac death: Secondary | ICD-10-CM

## 2016-02-26 NOTE — Progress Notes (Unsigned)
The patient was admitted for treadmill testing. Baseline QTc interval was about 438 ms. In the minutes 2-4 of recovery, the QTc lengthened to greater than 470 ms which is associated with a 70% positive predictive value for LQT 1. There is also some further interval lengthening between minute 1 and admitted for suggestive of LQT 2. The patient has had no symptoms. She has hypertension and so one thought would be to discontinue her amlodipine and use a beta blocker, Specifically either nadolol or propranolol. However, she also has diabetes and I wonder whether in the absence of prior cardiac symptoms she would not be better off on an ARB as she is not able to tolerate lisinopril apparently but might be afforded renal protection.  We have discussed her son's situation. He is 70 years old. I suggested that she get up in touch with Dr. Ermalene Searing EP . For the [facility] system.    I have also advised her that she should avoid QT prolonging drugs and I have given her the QT drugs.org website

## 2016-02-26 NOTE — Progress Notes (Unsigned)
The patient was admitted for treadmill testing. Baseline QTc interval was about 438 ms. In the minutes 2-4 of recovery, the QTc lengthened to greater than 470 ms which is associated with a 70% positive predictive value for LQT 1. There is also some further interval lengthening between minute 1 and admitted for suggestive of LQT 2. The patient has had no symptoms. She has hypertension and so one thought would be to discontinue her amlodipine and use a beta blocker, Specifically either nadolol or propranolol. However, she also has diabetes and I wonder whether in the absence of prior cardiac symptoms she would not be better off on an ARB as she is not able to tolerate lisinopril apparently but might be afforded renal protection.  We have discussed her son's situation. He is 68 years old. I suggested that she get up in touch with Dr. Ermalene Searing EP . For the [facility] system.    I have also advised her that she should avoid QT prolonging drugs and I have given her the QT drugs.org website

## 2016-02-27 ENCOUNTER — Encounter: Payer: Self-pay | Admitting: Internal Medicine

## 2016-02-27 ENCOUNTER — Telehealth: Payer: Self-pay | Admitting: Family Medicine

## 2016-02-27 NOTE — Telephone Encounter (Signed)
Patient Is needing a refill for amlodipine. Patient also stated that she needs a bp med that has beta blocker according to cardiologist. Please follow up.

## 2016-03-01 ENCOUNTER — Telehealth: Payer: Self-pay | Admitting: Cardiovascular Disease

## 2016-03-01 NOTE — Telephone Encounter (Signed)
Writer gave note to schedulers to make an appt with MD.

## 2016-03-01 NOTE — Telephone Encounter (Signed)
She can be transferred to the front desk to schedule a follow-up appointment.

## 2016-03-01 NOTE — Telephone Encounter (Signed)
New Message  Pt call requesting to speak with RN. Pt states she was instructed to get a beta blocker from PCP after ETT but states her PCP did not receive any info or records to start the Beta blocker. Please call back to discuss

## 2016-03-01 NOTE — Telephone Encounter (Signed)
Per ETT dated 02-26-16: The patient exercised following a manual protocol.  The patient reported no symptoms during the stress test. The patient experienced no angina during the stress test.   The patient requested the test to be stopped. The test was stopped because the patient complained of fatigue.   Blood pressure and heart rate demonstrated a normal response to exercise. Blood pressure demonstrated a normal response to exercise. Overall, the patient's exercise capacity was mildly impaired.   Recovery time: 7 minutes. The patient's response to exercise was adequate for diagnosis.  Please advise

## 2016-03-01 NOTE — Telephone Encounter (Signed)
Writer spoke with patient who stated understanding regarding the beta blocker and will call cardiology regarding this matter. Patient is requesting a follow up chest xray following her pneumonia and an appt with MD.

## 2016-03-01 NOTE — Telephone Encounter (Signed)
ETT was ordered by Dr. Caryl Comes.   Deboraha Sprang, MD  Electrophysiology     The patient was admitted for treadmill testing. Baseline QTc interval was about 438 ms. In the minutes 2-4 of recovery, the QTc lengthened to greater than 470 ms which is associated with a 70% positive predictive value for LQT 1. There is also some further interval lengthening between minute 1 and admitted for suggestive of LQT 2. The patient has had no symptoms. She has hypertension and so one thought would be to discontinue her amlodipine and use a beta blocker, Specifically either nadolol or propranolol. However, she also has diabetes and I wonder whether in the absence of prior cardiac symptoms she would not be better off on an ARB as she is not able to tolerate lisinopril apparently but might be afforded renal protection.  We have discussed her son's situation. He is 40 years old. I suggested that she get up in touch with Dr. Ermalene Searing EP . For the [facility] system.    I have also advised her that she should avoid QT prolonging drugs and I have given her the QT drugs.org website       Documentation on 02/26/2016        Detailed Report

## 2016-03-01 NOTE — Telephone Encounter (Signed)
Looks like that was initiated by Cardiology on 01/27/16 and she still has one refill left. I will be happy to refill after it is exhausted. I have reviewed cardiology note and do not see recommendations for beta blocker; they would have placed her on a Beta blocker rather than Amlodipine if they thought she needed to be on one.

## 2016-03-02 ENCOUNTER — Telehealth: Payer: Self-pay | Admitting: Internal Medicine

## 2016-03-02 MED ORDER — PROPRANOLOL HCL ER 80 MG PO CP24: 80.0000 mg | ORAL_CAPSULE | Freq: Every day | ORAL | 6 refills | Status: DC

## 2016-03-02 NOTE — Telephone Encounter (Signed)
Follow Up:     Pt calling again today,she feels like she is getting the run around. All the pt wants is her TEE results explained to her. She also wants to find out if she needs to take a beta blocker and who is supposed to prescribe it. Her primary care doctor says Dr Caryl Comes is supposed to be prescribing it and Dr Caryl Comes says her primary doctor is supposed to be prescribing it. She just needs to know who,so if she needs the medicine she can take it.

## 2016-03-02 NOTE — Telephone Encounter (Signed)
Will forward to Dr. Klein to review. 

## 2016-03-02 NOTE — Telephone Encounter (Signed)
RX sent in for propranolol 80 mg once daily- amlodipine discontinued.

## 2016-03-02 NOTE — Telephone Encounter (Signed)
Dr. Caryl Comes did the stress test and evaluate her for long QT syndrome with sudden cardiac death in her family. I would prefer that he make a decision with regards to beta blocker therapy.

## 2016-03-02 NOTE — Telephone Encounter (Signed)
I called and spoke with the patient. We will work on trying to get her son an appointment with Dr. Ermalene Searing. We will discontinue her amlodipine and begin her on propranolol 80 mg a day.

## 2016-03-02 NOTE — Telephone Encounter (Signed)
Pt called back today yelling at me because she has not heard back from Dr Gwenlyn Found or Dr Caryl Comes. She states that she does not know if she needs to start a bet blocker or not. She states that she does not know who should be prescribing, Dr Caryl Comes said that PCP was supposed to and he states that cardiology needs to prescribe.   Please advise

## 2016-03-03 LAB — EXERCISE TOLERANCE TEST
CHL CUP STRESS STAGE 1 DBP: 87 mmHg
CHL CUP STRESS STAGE 1 GRADE: 0 %
CHL CUP STRESS STAGE 1 HR: 100 {beats}/min
CHL CUP STRESS STAGE 1 SPEED: 0 mph
CHL CUP STRESS STAGE 2 HR: 106 {beats}/min
CHL CUP STRESS STAGE 2 SPEED: 1 mph
CHL CUP STRESS STAGE 3 HR: 107 {beats}/min
CHL CUP STRESS STAGE 3 SPEED: 1 mph
CHL CUP STRESS STAGE 4 GRADE: 10 %
CHL CUP STRESS STAGE 4 SPEED: 1.7 mph
CHL CUP STRESS STAGE 5 DBP: 92 mmHg
CHL CUP STRESS STAGE 5 GRADE: 12 %
CHL CUP STRESS STAGE 5 HR: 137 {beats}/min
CHL CUP STRESS STAGE 5 SBP: 180 mmHg
CHL CUP STRESS STAGE 5 SPEED: 2.5 mph
CHL CUP STRESS STAGE 6 DBP: 93 mmHg
CHL CUP STRESS STAGE 6 GRADE: 14 %
CHL CUP STRESS STAGE 6 HR: 153 {beats}/min
CHL CUP STRESS STAGE 6 SBP: 190 mmHg
CHL CUP STRESS STAGE 7 DBP: 93 mmHg
CHL CUP STRESS STAGE 7 GRADE: 0 %
CHL CUP STRESS STAGE 7 SBP: 166 mmHg
CHL CUP STRESS STAGE 8 DBP: 82 mmHg
CHL CUP STRESS STAGE 8 GRADE: 0 %
CHL CUP STRESS STAGE 8 SPEED: 0 mph
CSEPPBP: 190 mmHg
CSEPPHR: 153 {beats}/min
Estimated workload: 9.3 METS
Exercise duration (min): 4 min
Exercise duration (sec): 43 s
MPHR: 180 {beats}/min
Percent HR: 86 %
Percent of predicted max HR: 85 %
RPE: 16
Rest HR: 95 {beats}/min
Stage 1 SBP: 130 mmHg
Stage 2 Grade: 0 %
Stage 3 Grade: 0 %
Stage 4 HR: 122 {beats}/min
Stage 6 Speed: 3.4 mph
Stage 7 HR: 130 {beats}/min
Stage 7 Speed: 1.5 mph
Stage 8 HR: 96 {beats}/min
Stage 8 SBP: 132 mmHg

## 2016-03-04 ENCOUNTER — Emergency Department
Admission: EM | Admit: 2016-03-04 | Discharge: 2016-03-04 | Disposition: A | Discharge status: Home / Self Care | Payer: Medicaid Other | Attending: Student in an Organized Health Care Education/Training Program | Admitting: Student in an Organized Health Care Education/Training Program

## 2016-03-04 ENCOUNTER — Other Ambulatory Visit: Payer: Self-pay

## 2016-03-04 ENCOUNTER — Emergency Department: Payer: Medicaid Other

## 2016-03-04 DIAGNOSIS — R42 Dizziness and giddiness: Secondary | ICD-10-CM | POA: Diagnosis present

## 2016-03-04 DIAGNOSIS — E1165 Type 2 diabetes mellitus with hyperglycemia: Secondary | ICD-10-CM | POA: Insufficient documentation

## 2016-03-04 DIAGNOSIS — Z79899 Other long term (current) drug therapy: Secondary | ICD-10-CM | POA: Insufficient documentation

## 2016-03-04 DIAGNOSIS — I10 Essential (primary) hypertension: Secondary | ICD-10-CM | POA: Insufficient documentation

## 2016-03-04 DIAGNOSIS — N3001 Acute cystitis with hematuria: Secondary | ICD-10-CM | POA: Diagnosis not present

## 2016-03-04 DIAGNOSIS — Z7984 Long term (current) use of oral hypoglycemic drugs: Secondary | ICD-10-CM | POA: Diagnosis not present

## 2016-03-04 DIAGNOSIS — R739 Hyperglycemia, unspecified: Secondary | ICD-10-CM

## 2016-03-04 HISTORY — DX: Dizziness and giddiness: R42

## 2016-03-04 LAB — URINALYSIS, COMPLETE (UACMP) WITH MICROSCOPIC
BILIRUBIN URINE: NEGATIVE
GLUCOSE, UA: 150 mg/dL — AB
Ketones, ur: NEGATIVE mg/dL
NITRITE: POSITIVE — AB
Protein, ur: NEGATIVE mg/dL
SPECIFIC GRAVITY, URINE: 1.013 (ref 1.005–1.030)
pH: 6 (ref 5.0–8.0)

## 2016-03-04 LAB — BASIC METABOLIC PANEL
ANION GAP: 7 (ref 5–15)
BUN: 9 mg/dL (ref 6–20)
CALCIUM: 8.8 mg/dL — AB (ref 8.9–10.3)
CO2: 21 mmol/L — ABNORMAL LOW (ref 22–32)
Chloride: 108 mmol/L (ref 101–111)
Creatinine, Ser: 0.72 mg/dL (ref 0.44–1.00)
GFR calc Af Amer: >60 mL/min (ref >60)
Glucose, Bld: 276 mg/dL — ABNORMAL HIGH (ref 65–99)
POTASSIUM: 3.8 mmol/L (ref 3.5–5.1)
SODIUM: 136 mmol/L (ref 135–145)

## 2016-03-04 LAB — CBC
HEMATOCRIT: 38.8 % (ref 35.0–47.0)
HEMOGLOBIN: 13.2 g/dL (ref 12.0–16.0)
MCH: 29 pg (ref 26.0–34.0)
MCHC: 34 g/dL (ref 32.0–36.0)
MCV: 85.2 fL (ref 80.0–100.0)
Platelets: 384 10*3/uL (ref 150–440)
RBC: 4.55 MIL/uL (ref 3.80–5.20)
RDW: 14.4 % (ref 11.5–14.5)
WBC: 7 10*3/uL (ref 3.6–11.0)

## 2016-03-04 LAB — TROPONIN I

## 2016-03-04 MED ORDER — NITROFURANTOIN MONOHYD MACRO 100 MG PO CAPS: 100.0000 mg | ORAL_CAPSULE | Freq: Two times a day (BID) | ORAL | 0 refills | Status: AC

## 2016-03-04 MED ORDER — MECLIZINE HCL 25 MG PO TABS
25.0000 mg | ORAL_TABLET | Freq: Once | ORAL | Status: AC
  Administered 2016-03-04: 25 mg via ORAL
  Filled 2016-03-04: qty 1

## 2016-03-04 NOTE — ED Provider Notes (Signed)
[facility] Emergency Department Provider Note    First MD Initiated Contact with Patient 03/04/16 [phone]     (approximate)  I have reviewed the triage vital signs and the nursing notes.   HISTORY  Chief Complaint Dizziness and Nausea    HPI Tonya Paul is a 40 y.o. female who presents with chief complaint of sudden onset dizziness. Patient called EMS that she awoke from sleep at 3 AM. Initially she felt fine but when she sat up quickly she felt vertiginous. Patient has had a history of vertigo but states that her previous episodes were typically associated with nausea and vomiting. This time she did not have any nausea or vomiting. No numbness or tingling. No headaches. No recent trauma. No recent fevers. She has meclizine prescribed for vertigo but did not take any. She is currently feeling nausea that started after getting into the EMS vehicle. Eyes any chest pain or shortness of breath. No abdominal pain.   Past Medical History:  Diagnosis Date  . Diabetes mellitus without complication ([place])   . Hypertension   . Prolonged QT syndrome   . Vertigo    Family History  Problem Relation Age of Onset  . Hypertension Mother   . Lung cancer Mother   . Throat cancer Father   . Diabetes Sister   . Lupus Maternal Grandmother   . Stroke Maternal Grandfather   . Sudden Cardiac Death Daughter    History reviewed. No pertinent surgical history. Patient Active Problem List   Diagnosis Date Noted  . Palpitations 02/03/2016  . Uterine fibroid 12/11/2015  . Family history of heart disease 12/03/2015  . Hemorrhoids 11/18/2015  . Left breast mass 09/01/2015  . Obesity 09/01/2015  . Diabetes ([place]) 07/15/2015  . Hypertension 04/16/2015      Prior to Admission medications   Medication Sig Start Date End Date Taking? Authorizing Provider  azithromycin (ZITHROMAX) 250 MG tablet Take 2 tablets PO on day 1, then take 1 tablet PO daily for 4 more days 01/27/16    Hinda Kehr, MD  Blood Glucose Monitoring Suppl (TRUE METRIX METER) DEVI 1 each by Does not apply route 3 (three) times daily before meals. 04/16/15   Arnoldo Morale, MD  fluconazole (DIFLUCAN) 150 MG tablet Take 1 tablet (150 mg total) by mouth daily. Take one tablet today and another in 3 days if no better 11/26/15   Jami L Hagler, PA-C  glipiZIDE (GLUCOTROL) 5 MG tablet Take 1 tablet (5 mg total) by mouth 2 (two) times daily before a meal. 11/18/15   Arnoldo Morale, MD  glucose blood (TRUE METRIX BLOOD GLUCOSE TEST) test strip Used 3 times daily as directed before meals 04/16/15   Arnoldo Morale, MD  meclizine (ANTIVERT) 25 MG tablet Take 25 mg by mouth 3 (three) times daily as needed for dizziness or nausea.    Historical Provider, MD  metFORMIN (GLUCOPHAGE) 500 MG tablet Take 1 tablet (500 mg total) by mouth 3 (three) times daily before meals. 10/06/15 10/05/16  Arnoldo Morale, MD  omeprazole (PRILOSEC) 20 MG capsule Take 1 capsule (20 mg total) by mouth daily. 11/18/15   Arnoldo Morale, MD  propranolol ER (INDERAL LA) 80 MG 24 hr capsule Take 1 capsule (80 mg total) by mouth daily. 03/02/16   Deboraha Sprang, MD  TRUEPLUS LANCETS 28G MISC 1 each by Does not apply route 3 (three) times daily before meals. 04/16/15   Arnoldo Morale, MD    Allergies Ace inhibitors; Aspirin; and  Ciprofloxacin    Social History Social History  Substance Use Topics  . Smoking status: Never Smoker  . Smokeless tobacco: Never Used  . Alcohol use No    Review of Systems Patient denies headaches, rhinorrhea, blurry vision, numbness, shortness of breath, chest pain, edema, cough, abdominal pain, nausea, vomiting, diarrhea, dysuria, fevers, rashes or hallucinations unless otherwise stated above in HPI. ____________________________________________   PHYSICAL EXAM:  VITAL SIGNS: Vitals:   03/04/16 0441  BP: 136/90  Pulse: 79  Resp: 16  Temp: 97.9 F (36.6 C)    Constitutional: Alert and oriented. Well appearing and in  no acute distress. Eyes: Conjunctivae are normal. PERRL. EOMI. Head: Atraumatic. Nose: No congestion/rhinnorhea. Mouth/Throat: Mucous membranes are moist.  Oropharynx non-erythematous. Neck: No stridor. Painless ROM. No cervical spine tenderness to palpation Hematological/Lymphatic/Immunilogical: No cervical lymphadenopathy. Cardiovascular: Normal rate, regular rhythm. Grossly normal heart sounds.  Good peripheral circulation. Respiratory: Normal respiratory effort.  No retractions. Lungs CTAB. Gastrointestinal: Soft and nontender. No distention. No abdominal bruits. No CVA tenderness. Genitourinary:  Musculoskeletal: No lower extremity tenderness nor edema.  No joint effusions. Neurologic: CN- intact.  No facial droop, Normal FNF.  Normal heel to shin.  Sensation intact bilaterally. Normal speech and language. No gross focal neurologic deficits are appreciated. No gait instability.  Skin:  Skin is warm, dry and intact. No rash noted. Psychiatric: Mood and affect are normal. Speech and behavior are normal.  ____________________________________________   LABS (all labs ordered are listed, but only abnormal results are displayed)  Results for orders placed or performed during the hospital encounter of 03/04/16 (from the past 24 hour(s))  Basic metabolic panel     Status: Abnormal   Collection Time: 03/04/16  4:49 AM  Result Value Ref Range   Sodium 136 135 - 145 mmol/L   Potassium 3.8 3.5 - 5.1 mmol/L   Chloride 108 101 - 111 mmol/L   CO2 21 (L) 22 - 32 mmol/L   Glucose, Bld 276 (H) 65 - 99 mg/dL   BUN 9 6 - 20 mg/dL   Creatinine, Ser 0.72 0.44 - 1.00 mg/dL   Calcium 8.8 (L) 8.9 - 10.3 mg/dL   GFR calc non Af Amer >60 >60 mL/min   GFR calc Af Amer >60 >60 mL/min   Anion gap 7 5 - 15  CBC     Status: None   Collection Time: 03/04/16  4:49 AM  Result Value Ref Range   WBC 7.0 3.6 - 11.0 K/uL   RBC 4.55 3.80 - 5.20 MIL/uL   Hemoglobin 13.2 12.0 - 16.0 g/dL   HCT 38.8 35.0 - 47.0  %   MCV 85.2 80.0 - 100.0 fL   MCH 29.0 26.0 - 34.0 pg   MCHC 34.0 32.0 - 36.0 g/dL   RDW 14.4 11.5 - 14.5 %   Platelets 384 150 - 440 K/uL  Troponin I     Status: None   Collection Time: 03/04/16  4:49 AM  Result Value Ref Range   Troponin I <0.03 <0.03 ng/mL  Urinalysis, Complete w Microscopic     Status: Abnormal   Collection Time: 03/04/16  5:23 AM  Result Value Ref Range   Color, Urine YELLOW (A) YELLOW   APPearance CLOUDY (A) CLEAR   Specific Gravity, Urine 1.013 1.005 - 1.030   pH 6.0 5.0 - 8.0   Glucose, UA 150 (A) NEGATIVE mg/dL   Hgb urine dipstick LARGE (A) NEGATIVE   Bilirubin Urine NEGATIVE NEGATIVE   Ketones, ur NEGATIVE  NEGATIVE mg/dL   Protein, ur NEGATIVE NEGATIVE mg/dL   Nitrite POSITIVE (A) NEGATIVE   Leukocytes, UA MODERATE (A) NEGATIVE   RBC / HPF TOO NUMEROUS TO COUNT 0 - 5 RBC/hpf   WBC, UA TOO NUMEROUS TO COUNT 0 - 5 WBC/hpf   Bacteria, UA MANY (A) NONE SEEN   Squamous Epithelial / LPF 0-5 (A) NONE SEEN   Mucous PRESENT    ____________________________________________  EKG My review and personal interpretation at Time: 4:45   Indication: dizziness  Rate: 85  Rhythm: sinus Axis: normal Other: normal intervals, no acute ischemic changes ____________________________________________  RADIOLOGY  I personally reviewed all radiographic images ordered to evaluate for the above acute complaints and reviewed radiology reports and findings.  These findings were personally discussed with the patient.  Please see medical record for radiology report.  ____________________________________________   PROCEDURES  Procedure(s) performed: none Procedures    Critical Care performed: no ____________________________________________   INITIAL IMPRESSION / ASSESSMENT AND PLAN / ED COURSE  Pertinent labs & imaging results that were available during my care of the patient were reviewed by me and considered in my medical decision making (see chart for  details).  DDX: vertigo, sdh, cva, hyperglycemia, uti, dehydration, dysrhythmia  Tonya Paul is a 40 y.o. who presents to the ED with vertigo.  Patient is AFVSS in ED. Exam as above. Given current presentation have considered the above differential.  CT imaging ordered to evaluate for CVA shows no acute intracranial abnormality. Neuro exam shows no acute deficit. Tonya Paul is otherwise reassuring with exception of mild hyperglycemia. On urinalysis the patient does have evidence of UTI. No evidence of pyelonephritis or systemic illness. I will order meclizine and reassess.  Clinical Course as of Mar 04 745  Thu Mar 04, 2016  0722 Patient reassessed and an no acute distress. Dizziness has resolved after receiving meclizine. CT head shows no acute intracranial abnormality. We'll treat for UTI. Patient able tolerate oral hydration able to ambulate with a steady gait. Discussed follow-up with PCP.  Have discussed with the patient and available family all diagnostics and treatments performed thus far and all questions were answered to the best of my ability. The patient demonstrates understanding and agreement with plan.   [PR]    Clinical Course User Index [PR] Merlyn Lot, MD     ____________________________________________   FINAL CLINICAL IMPRESSION(S) / ED DIAGNOSES  Final diagnoses:  Dizziness  Vertigo  Acute cystitis with hematuria  Hyperglycemia      NEW MEDICATIONS STARTED DURING THIS VISIT:  New Prescriptions   No medications on file     Note:  This document was prepared using Dragon voice recognition software and may include unintentional dictation errors.    Merlyn Lot, MD 03/04/16 [phone]

## 2016-03-04 NOTE — ED Triage Notes (Signed)
Pt presents to ED via [facility] with c/o dizziness upon waking at 3am this morning. Pt states she waited for s/x's to pass, but got worse so she called EMS. Pt reports h/x of vertigo but states current s/x's are not similar. Pt reports (+) nausea, but denies vomiting. Pt also with c/o CP, SHOB; also denies any c/o weakness, numbness, difficulty speaking or swallowing; no facial droop noted. Pt does report an area of pain in her chest located in the right shoulder.

## 2016-03-04 NOTE — ED Notes (Signed)
Spoke with Dr Owens Shark regarding pt's presenting s/x's, VS, and complaints. VORB for standing orders/protocols to be entered, but no CT scan indicated at this time.

## 2016-03-05 ENCOUNTER — Ambulatory Visit: Payer: Medicaid Other | Admitting: Family Medicine

## 2016-03-10 NOTE — Telephone Encounter (Signed)
My Chart message sent to the patient today that I have scheduled her son an appointment with Dr. Theadore Nan for:  Tuesday 04/13/16 @ 11:00 am   Office # [phone].  Address -  [address], [place].

## 2016-03-24 ENCOUNTER — Ambulatory Visit: Payer: Self-pay | Admitting: "Endocrinology

## 2016-03-25 MED FILL — glipiZIDE 5 MG TABS: 5 | 30 days supply | Qty: 60 | Fill #1

## 2016-03-30 MED FILL — metFORMIN HCL 500 MG TABS: 500 | 30 days supply | Qty: 90 | Fill #2

## 2016-04-15 ENCOUNTER — Ambulatory Visit: Payer: Medicaid Other

## 2016-04-16 ENCOUNTER — Encounter: Payer: Self-pay | Admitting: "Endocrinology

## 2016-04-16 ENCOUNTER — Ambulatory Visit (INDEPENDENT_AMBULATORY_CARE_PROVIDER_SITE_OTHER): Payer: Medicaid Other | Admitting: "Endocrinology

## 2016-04-16 DIAGNOSIS — E1165 Type 2 diabetes mellitus with hyperglycemia: Secondary | ICD-10-CM | POA: Diagnosis not present

## 2016-04-16 DIAGNOSIS — IMO0001 Reserved for inherently not codable concepts without codable children: Secondary | ICD-10-CM

## 2016-04-16 MED ORDER — CANAGLIFLOZIN 100 MG PO TABS: 100.0000 mg | ORAL_TABLET | Freq: Every day | ORAL | 2 refills | Status: DC

## 2016-04-16 NOTE — Progress Notes (Signed)
Subjective:    Patient ID: Tonya Paul, female    DOB: [birth year]. Patient is being seen in consultation for management of diabetes requested by  Arnoldo Morale, MD  Past Medical History:  Diagnosis Date  . Diabetes mellitus without complication ([place])   . Hypertension   . Prolonged QT syndrome   . Vertigo    No past surgical history on file. Social History   Social History  . Marital status: Married    Spouse name: N/A  . Number of children: N/A  . Years of education: N/A   Social History Main Topics  . Smoking status: Never Smoker  . Smokeless tobacco: Never Used  . Alcohol use No  . Drug use: No  . Sexual activity: Not on file   Other Topics Concern  . Not on file   Social History Narrative  . No narrative on file   Outpatient Encounter Prescriptions as of 04/16/2016  Medication Sig  . metFORMIN (GLUCOPHAGE) 500 MG tablet Take 500 mg by mouth 2 (two) times daily with a meal.  . Blood Glucose Monitoring Suppl (TRUE METRIX METER) DEVI 1 each by Does not apply route 3 (three) times daily before meals.  . canagliflozin (INVOKANA) 100 MG TABS tablet Take 1 tablet (100 mg total) by mouth daily before breakfast.  . glucose blood (TRUE METRIX BLOOD GLUCOSE TEST) test strip Used 3 times daily as directed before meals  . meclizine (ANTIVERT) 25 MG tablet Take 25 mg by mouth 3 (three) times daily as needed for dizziness or nausea.  Marland Kitchen omeprazole (PRILOSEC) 20 MG capsule Take 1 capsule (20 mg total) by mouth daily.  . propranolol ER (INDERAL LA) 80 MG 24 hr capsule Take 1 capsule (80 mg total) by mouth daily.  . TRUEPLUS LANCETS 28G MISC 1 each by Does not apply route 3 (three) times daily before meals.  . [DISCONTINUED] azithromycin (ZITHROMAX) 250 MG tablet Take 2 tablets PO on day 1, then take 1 tablet PO daily for 4 more days  . [DISCONTINUED] fluconazole (DIFLUCAN) 150 MG tablet Take 1 tablet (150 mg total) by mouth daily. Take one tablet today and another in 3 days if no  better  . [DISCONTINUED] glipiZIDE (GLUCOTROL) 5 MG tablet Take 1 tablet (5 mg total) by mouth 2 (two) times daily before a meal.  . [DISCONTINUED] metFORMIN (GLUCOPHAGE) 500 MG tablet Take 1 tablet (500 mg total) by mouth 3 (three) times daily before meals. (Patient taking differently: Take 500 mg by mouth daily with breakfast. )   No facility-administered encounter medications on file as of 04/16/2016.    ALLERGIES: Allergies  Allergen Reactions  . Ace Inhibitors Shortness Of Breath    Reported by the patient after a trial of lisinopril prescribes by PCP  . Aspirin Anaphylaxis  . Ciprofloxacin Itching   VACCINATION STATUS:  There is no immunization history on file for this patient.  Diabetes  She presents for her initial diabetic visit. She has type 2 diabetes mellitus. Onset time: She was diagnosed at approximate age of 8 years. Her disease course has been worsening. There are no hypoglycemic associated symptoms. Pertinent negatives for hypoglycemia include no confusion, headaches, pallor or seizures. Associated symptoms include fatigue, polydipsia and polyuria. Pertinent negatives for diabetes include no chest pain and no polyphagia. There are no hypoglycemic complications. Symptoms are worsening. There are no diabetic complications. Risk factors for coronary artery disease include diabetes mellitus, obesity and sedentary lifestyle. Current diabetic treatments: Metformin 500 mg by mouth  3 times a day,  glipizide 5 mg twice a day. Her weight is increasing steadily. She is following a generally unhealthy diet. When asked about meal planning, she reported none. She has not had a previous visit with a dietitian. She rarely participates in exercise. (She did not bring any meter nor logs to review today.) An ACE inhibitor/angiotensin II receptor blocker is not being taken.       Review of Systems  Constitutional: Positive for fatigue. Negative for chills, fever and unexpected weight change.   HENT: Negative for trouble swallowing and voice change.   Eyes: Negative for visual disturbance.  Respiratory: Negative for cough, shortness of breath and wheezing.   Cardiovascular: Negative for chest pain, palpitations and leg swelling.  Gastrointestinal: Negative for diarrhea, nausea and vomiting.  Endocrine: Positive for polydipsia and polyuria. Negative for cold intolerance, heat intolerance and polyphagia.  Musculoskeletal: Negative for arthralgias and myalgias.  Skin: Negative for color change, pallor, rash and wound.  Neurological: Negative for seizures and headaches.  Psychiatric/Behavioral: Negative for confusion and suicidal ideas.    Objective:    There were no vitals taken for this visit.  Wt Readings from Last 3 Encounters:  03/04/16 210 lb (95.3 kg)  02/03/16 210 lb (95.3 kg)  01/27/16 215 lb (97.5 kg)    Physical Exam  Constitutional: She is oriented to person, place, and time. She appears well-developed.  HENT:  Head: Normocephalic and atraumatic.  Eyes: EOM are normal.  Neck: Normal range of motion. Neck supple. No tracheal deviation present. No thyromegaly present.  Cardiovascular: Normal rate and regular rhythm.   Pulmonary/Chest: Effort normal and breath sounds normal.  Abdominal: Soft. Bowel sounds are normal. There is no tenderness. There is no guarding.  Musculoskeletal: Normal range of motion. She exhibits no edema.  Neurological: She is alert and oriented to person, place, and time. She has normal reflexes. No cranial nerve deficit. Coordination normal.  Skin: Skin is warm and dry. No rash noted. No erythema. No pallor.  Psychiatric: She has a normal mood and affect. Judgment normal.    CMP     Component Value Date/Time   NA 136 03/04/2016 0449   K 3.8 03/04/2016 0449   CL 108 03/04/2016 0449   CO2 21 (L) 03/04/2016 0449   GLUCOSE 276 (H) 03/04/2016 0449   BUN 9 03/04/2016 0449   CREATININE 0.72 03/04/2016 0449   CREATININE 0.84 04/18/2015 0936    CALCIUM 8.8 (L) 03/04/2016 0449   PROT 7.2 01/27/2016 0039   ALBUMIN 3.9 01/27/2016 0039   AST 19 01/27/2016 0039   ALT 21 01/27/2016 0039   ALKPHOS 58 01/27/2016 0039   BILITOT 0.3 01/27/2016 0039   GFRNONAA >60 03/04/2016 0449   GFRNONAA 88 04/18/2015 0936   GFRAA >60 03/04/2016 0449   GFRAA >89 04/18/2015 0936     Diabetic Labs (most recent): Lab Results  Component Value Date   HGBA1C 10.0 11/18/2015   HGBA1C 7.3 07/15/2015   HGBA1C 7.50 04/16/2015     Lipid Panel ( most recent) Lipid Panel     Component Value Date/Time   CHOL 143 04/18/2015 0936   TRIG 67 04/18/2015 0936   HDL 40 (L) 04/18/2015 0936   CHOLHDL 3.6 04/18/2015 0936   VLDL 13 04/18/2015 0936   LDLCALC 90 04/18/2015 0936       Assessment & Plan:   1. Uncontrolled type 2 diabetes mellitus without complication, without long-term current use of insulin ([place])  - Patient has currently uncontrolled  symptomatic type 2 DM since  41 years of age,  with most recent A1c of 10 %. Recent labs reviewed.   She does not report gross complications from diabetes,  However, patient remains at a high risk for more acute and chronic complications of diabetes which include CAD, CVA, CKD, retinopathy, and neuropathy. These are all discussed in detail with the patient.  - I have counseled the patient on diet management and weight loss, by adopting a carbohydrate restricted/protein rich diet.  - Suggestion is made for patient to avoid simple carbohydrates   from their diet including Cakes , Desserts, Ice Cream,  Soda (  diet and regular) , Sweet Tea , Candies,  Chips, Cookies, Artificial Sweeteners,   and "Sugar-free" Products . This will help patient to have stable blood glucose profile and potentially avoid unintended weight gain.  - I encouraged the patient to switch to  unprocessed or minimally processed complex starch and increased protein intake (animal or plant source), fruits, and vegetables.  - Patient is advised  to stick to a routine mealtimes to eat 3 meals  a day and avoid unnecessary snacks ( to snack only to correct hypoglycemia).  - The patient will be scheduled with Jearld Fenton, RDN, CDE for individualized DM education.  - I have approached patient with the following individualized plan to manage diabetes and patient agrees:   -  She seems motivated to give diet and exercise a chance to control her diabetes. - I will delay initiation of insulin treatment for now. - I will continue metformin 500 mg by mouth twice a day, therapeutically suitable for patient. - I will initiate Invokana 100mg  by mouth every morning. Side effects and precautions discussed with her. - I will discontinue glipizide, risk outweighs benefit for this patient.  - Patient will be considered for incretin therapy as appropriate next visit. - Patient specific target  A1c;  LDL, HDL, Triglycerides, and  Waist Circumference were discussed in detail.  2) BP/HTN: Controlled.  She is not on ACEI/ARB. She is on propranolol for cardiac reasons.  3) Lipids/HPL:    controlled.   Patient is not on statins. 4)  Weight/Diet: CDE Consult will be initiated , exercise, and detailed carbohydrates information provided.  5) Chronic Care/Health Maintenance:  -Patient is not  on ACEI/ARB and Statin medications ;  she is encouraged to continue to follow up with Ophthalmology, Podiatrist at least yearly or according to recommendations, and advised to   stay away from smoking. I have recommended yearly flu vaccine and pneumonia vaccination at least every 5 years; moderate intensity exercise for up to 150 minutes weekly; and  sleep for at least 7 hours a day.  - 60 minutes of time was spent on the care of this patient , 50% of which was applied for counseling on diabetes complications and their preventions.  - I advised patient to maintain close follow up with Arnoldo Morale, MD for primary care needs.  Follow up plan: - Return in about 6 weeks  (around 05/28/2016) for follow up with pre-visit labs.  Glade Lloyd, MD Phone: [phone]  Fax: [phone]   04/16/2016, 11:52 AM

## 2016-04-16 NOTE — Patient Instructions (Signed)

## 2016-04-22 ENCOUNTER — Encounter: Payer: Self-pay | Admitting: Emergency Medicine

## 2016-04-22 ENCOUNTER — Emergency Department
Admission: EM | Admit: 2016-04-22 | Discharge: 2016-04-22 | Disposition: A | Discharge status: Home / Self Care | Payer: Medicaid Other | Attending: Emergency Medicine | Admitting: Emergency Medicine

## 2016-04-22 DIAGNOSIS — I10 Essential (primary) hypertension: Secondary | ICD-10-CM | POA: Insufficient documentation

## 2016-04-22 DIAGNOSIS — J101 Influenza due to other identified influenza virus with other respiratory manifestations: Secondary | ICD-10-CM

## 2016-04-22 DIAGNOSIS — J09X2 Influenza due to identified novel influenza A virus with other respiratory manifestations: Secondary | ICD-10-CM | POA: Insufficient documentation

## 2016-04-22 DIAGNOSIS — E119 Type 2 diabetes mellitus without complications: Secondary | ICD-10-CM | POA: Diagnosis not present

## 2016-04-22 DIAGNOSIS — R112 Nausea with vomiting, unspecified: Secondary | ICD-10-CM | POA: Diagnosis present

## 2016-04-22 DIAGNOSIS — Z7984 Long term (current) use of oral hypoglycemic drugs: Secondary | ICD-10-CM | POA: Diagnosis not present

## 2016-04-22 LAB — POCT PREGNANCY, URINE: Preg Test, Ur: NEGATIVE

## 2016-04-22 LAB — INFLUENZA PANEL BY PCR (TYPE A & B)
INFLBPCR: NEGATIVE
Influenza A By PCR: POSITIVE — AB

## 2016-04-22 MED ORDER — OSELTAMIVIR PHOSPHATE 75 MG PO CAPS: 75.0000 mg | ORAL_CAPSULE | Freq: Two times a day (BID) | ORAL | 0 refills | Status: AC

## 2016-04-22 MED ORDER — ONDANSETRON 4 MG PO TBDP: 4.0000 mg | ORAL_TABLET | Freq: Three times a day (TID) | ORAL | 0 refills | Status: DC | PRN

## 2016-04-22 MED ORDER — ONDANSETRON 4 MG PO TBDP
4.0000 mg | ORAL_TABLET | Freq: Once | ORAL | Status: AC
  Administered 2016-04-22: 4 mg via ORAL
  Filled 2016-04-22: qty 1

## 2016-04-22 NOTE — ED Provider Notes (Signed)
[facility] Emergency Department Provider Note  ____________________________________________   First MD Initiated Contact with Patient 04/22/16 1011     (approximate)  I have reviewed the triage vital signs and the nursing notes.   HISTORY  Chief Complaint Abdominal Pain; Headache; Emesis; Diarrhea; and Chest Pain   HPI Tonya Paul is a 41 y.o. female is here with complaint of nausea, vomiting and diarrhea for the last 4 days. She states that last night she began with sore throat, cough, fever and has been taking Tylenol for the symptoms. She is also had a cough for 2 days that has been nonproductive. This morning she woke up feeling worse. Currently patient states "I'm about to freeze". Patient did not get the flu shot this year. She denies any continued diarrhea but states that currently she is still experiencing some nausea with minimal vomiting. Patient is drinking fluids. Currently she rates her pain as 10 over 10.   Past Medical History:  Diagnosis Date  . Diabetes mellitus without complication ([place])   . Hypertension   . Prolonged QT syndrome   . Vertigo     Patient Active Problem List   Diagnosis Date Noted  . Palpitations 02/03/2016  . Uterine fibroid 12/11/2015  . Family history of heart disease 12/03/2015  . Hemorrhoids 11/18/2015  . Left breast mass 09/01/2015  . Obesity 09/01/2015  . Type 2 diabetes mellitus, uncontrolled ([place]) 04/16/2015    History reviewed. No pertinent surgical history.  Prior to Admission medications   Medication Sig Start Date End Date Taking? Authorizing Provider  Blood Glucose Monitoring Suppl (TRUE METRIX METER) DEVI 1 each by Does not apply route 3 (three) times daily before meals. 04/16/15   Arnoldo Morale, MD  canagliflozin (INVOKANA) 100 MG TABS tablet Take 1 tablet (100 mg total) by mouth daily before breakfast. 04/16/16   Cassandria Anger, MD  glucose blood (TRUE METRIX BLOOD GLUCOSE TEST) test strip  Used 3 times daily as directed before meals 04/16/15   Arnoldo Morale, MD  meclizine (ANTIVERT) 25 MG tablet Take 25 mg by mouth 3 (three) times daily as needed for dizziness or nausea.    Historical Provider, MD  metFORMIN (GLUCOPHAGE) 500 MG tablet Take 500 mg by mouth 2 (two) times daily with a meal.    Historical Provider, MD  omeprazole (PRILOSEC) 20 MG capsule Take 1 capsule (20 mg total) by mouth daily. 11/18/15   Arnoldo Morale, MD  ondansetron (ZOFRAN ODT) 4 MG disintegrating tablet Take 1 tablet (4 mg total) by mouth every 8 (eight) hours as needed for nausea or vomiting. 04/22/16   Johnn Hai, PA-C  oseltamivir (TAMIFLU) 75 MG capsule Take 1 capsule (75 mg total) by mouth 2 (two) times daily. 04/22/16 04/27/16  Johnn Hai, PA-C  propranolol ER (INDERAL LA) 80 MG 24 hr capsule Take 1 capsule (80 mg total) by mouth daily. 03/02/16   Deboraha Sprang, MD  TRUEPLUS LANCETS 28G MISC 1 each by Does not apply route 3 (three) times daily before meals. 04/16/15   Arnoldo Morale, MD    Allergies Ace inhibitors; Aspirin; and Ciprofloxacin  Family History  Problem Relation Age of Onset  . Hypertension Mother   . Lung cancer Mother   . Throat cancer Father   . Diabetes Sister   . Lupus Maternal Grandmother   . Stroke Maternal Grandfather   . Sudden Cardiac Death Daughter     Social History Social History  Substance Use Topics  .  Smoking status: Never Smoker  . Smokeless tobacco: Never Used  . Alcohol use No    Review of Systems Constitutional: Positive  fever/positive chills Eyes: No visual changes. ENT: No sore throat. Cardiovascular: Denies chest pain. Respiratory: Denies shortness of breath. Gastrointestinal: No abdominal pain.  Positive nausea, positive vomiting.  Resolved diarrhea.  No constipation. Genitourinary: Negative for dysuria. Musculoskeletal: Positive generalized muscle aches. Skin: Negative for rash. Neurological: Positive for headaches, no focal weakness or  numbness.  10-point ROS otherwise negative.  ____________________________________________   PHYSICAL EXAM:  VITAL SIGNS: ED Triage Vitals  Enc Vitals Group     BP 04/22/16 0946 136/81     Pulse --      Resp 04/22/16 0946 16     Temp 04/22/16 0946 99.2 F (37.3 C)     Temp Source 04/22/16 0946 Oral     SpO2 04/22/16 0946 98 %     Weight 04/22/16 0944 210 lb (95.3 kg)     Height 04/22/16 0944 5\' 10"  (1.778 m)     Head Circumference --      Peak Flow --      Pain Score 04/22/16 0944 10     Pain Loc --      Pain Edu? --      Excl. in [place]? --     Constitutional: Alert and oriented. Well appearing and in no acute distress. Eyes: Conjunctivae are normal. PERRL. EOMI. Head: Atraumatic. Nose: Moderate congestion/rhinnorhea.  EACs and TMs are clear bilaterally. Mouth/Throat: Mucous membranes are moist.  Oropharynx non-erythematous. Neck: No stridor.   Hematological/Lymphatic/Immunilogical: No cervical lymphadenopathy. Cardiovascular: Normal rate, regular rhythm. Grossly normal heart sounds.  Good peripheral circulation. Respiratory: Normal respiratory effort.  No retractions. Lungs CTAB. Gastrointestinal: Soft and nontender. No distention. Bowel sounds are normoactive at this time. Musculoskeletal: Moves upper and lower extremities without any difficulty. Normal gait was noted. Neurologic:  Normal speech and language. No gross focal neurologic deficits are appreciated. No gait instability. Skin:  Skin is warm, dry and intact. No rash noted. Psychiatric: Mood and affect are normal. Speech and behavior are normal.  ____________________________________________   LABS (all labs ordered are listed, but only abnormal results are displayed)  Labs Reviewed  INFLUENZA PANEL BY PCR (TYPE A & B) - Abnormal; Notable for the following:       Result Value   Influenza A By PCR POSITIVE (*)    All other components within normal limits  POC URINE PREG, ED  POCT PREGNANCY, URINE     PROCEDURES  Procedure(s) performed: None  Procedures  Critical Care performed: No  ____________________________________________   INITIAL IMPRESSION / ASSESSMENT AND PLAN / ED COURSE  Pertinent labs & imaging results that were available during my care of the patient were reviewed by me and considered in my medical decision making (see chart for details).  Urine pregnancy test was done in the emergency room just to confirm that she was not pregnant at the time of starting Tamiflu. Since symptoms of upper respiratory, fever and chills began last night it is felt that Tamiflu would be effective. She is to continue drinking clear fluids and also was given Zofran while in the emergency room with a prescription for the same. She is continue with Tylenol or ibuprofen as needed for body aches. She will follow-up with her PCP if any continued problems.    ___________________________________________   FINAL CLINICAL IMPRESSION(S) / ED DIAGNOSES  Final diagnoses:  Influenza A      NEW  MEDICATIONS STARTED DURING THIS VISIT:  Discharge Medication List as of 04/22/2016 12:03 PM    START taking these medications   Details  ondansetron (ZOFRAN ODT) 4 MG disintegrating tablet Take 1 tablet (4 mg total) by mouth every 8 (eight) hours as needed for nausea or vomiting., Starting Thu 04/22/2016, Print    oseltamivir (TAMIFLU) 75 MG capsule Take 1 capsule (75 mg total) by mouth 2 (two) times daily., Starting Thu 04/22/2016, Until Tue 04/27/2016, Print         Note:  This document was prepared using Dragon voice recognition software and may include unintentional dictation errors.    Johnn Hai, PA-C 04/22/16 [place], MD 04/22/16 1500

## 2016-04-22 NOTE — ED Triage Notes (Signed)
Pt states N,V,D since Sunday night, unable to keep anything down. Appears in no distress. States midsternal cp with cough and occasionally while standing still.

## 2016-04-22 NOTE — ED Notes (Signed)
See triage note  Developed body aches with n/v on Sunday   Positive headache and  sore throat .  States she vomited about 730 this morning    Low grade temp on arrival

## 2016-04-22 NOTE — ED Triage Notes (Signed)
Nausea, vomiting, diarrhea x 4 days.  Today sore throat, cough, and fever.  Cough x 2 days.  Tylenol taken at 0400.

## 2016-04-22 NOTE — Discharge Instructions (Signed)
Follow-up with your primary care doctor if any continued problems. Begin taking Zofran as needed for nausea. Tylenol or ibuprofen as needed for fever, headache, body aches. Increase fluids. Begin taking Tamiflu twice a day for the next 5 days for influenza.

## 2016-05-04 ENCOUNTER — Ambulatory Visit: Payer: Medicaid Other | Admitting: Nutrition

## 2016-05-07 ENCOUNTER — Ambulatory Visit: Payer: Medicaid Other | Admitting: Physician Assistant

## 2016-05-07 ENCOUNTER — Ambulatory Visit: Payer: Medicaid Other | Admitting: Family Medicine

## 2016-05-20 ENCOUNTER — Telehealth: Payer: Self-pay | Admitting: "Endocrinology

## 2016-05-20 NOTE — Telephone Encounter (Signed)
Pts BG readings high. No invokana. Pharmacy did not send for PA. Is there a medication this can be changed too.

## 2016-05-20 NOTE — Telephone Encounter (Signed)
Pt coming in tomorrow

## 2016-05-20 NOTE — Telephone Encounter (Signed)
Patient needs to come for visits as soon as possible, today or tomorrow for possible insulin initiation.

## 2016-05-20 NOTE — Telephone Encounter (Signed)
PT states that insurance will not cover invocana and she hasn't heard anything from [place] for a month. I asked her if she let us know her insurance wouldn't pay and she said no - the insurance was supposed to let us know...  Her last 3 days of readings were: [phone]   She is have blurry vision as well.

## 2016-05-21 ENCOUNTER — Encounter: Payer: Medicaid Other | Admitting: "Endocrinology

## 2016-05-25 NOTE — Progress Notes (Signed)
This encounter was created in error - please disregard.

## 2016-05-28 ENCOUNTER — Ambulatory Visit: Payer: Medicaid Other | Admitting: "Endocrinology

## 2016-06-04 MED FILL — ?METFORMIN HCL 500MG TABLET: 500 | 30 days supply | Qty: 90 | Fill #3

## 2016-07-15 ENCOUNTER — Ambulatory Visit: Payer: Medicaid Other | Attending: Family Medicine | Admitting: Family Medicine

## 2016-07-15 VITALS — BP 119/80 | HR 85 | Temp 98.4°F | Resp 18 | Ht 71.0 in | Wt 211.6 lb

## 2016-07-15 DIAGNOSIS — E1165 Type 2 diabetes mellitus with hyperglycemia: Secondary | ICD-10-CM | POA: Diagnosis not present

## 2016-07-15 DIAGNOSIS — K219 Gastro-esophageal reflux disease without esophagitis: Secondary | ICD-10-CM | POA: Diagnosis not present

## 2016-07-15 DIAGNOSIS — Z7984 Long term (current) use of oral hypoglycemic drugs: Secondary | ICD-10-CM | POA: Insufficient documentation

## 2016-07-15 DIAGNOSIS — L609 Nail disorder, unspecified: Secondary | ICD-10-CM | POA: Insufficient documentation

## 2016-07-15 DIAGNOSIS — Z79899 Other long term (current) drug therapy: Secondary | ICD-10-CM | POA: Insufficient documentation

## 2016-07-15 DIAGNOSIS — Z23 Encounter for immunization: Secondary | ICD-10-CM | POA: Diagnosis not present

## 2016-07-15 DIAGNOSIS — E119 Type 2 diabetes mellitus without complications: Secondary | ICD-10-CM | POA: Diagnosis present

## 2016-07-15 DIAGNOSIS — IMO0001 Reserved for inherently not codable concepts without codable children: Secondary | ICD-10-CM

## 2016-07-15 DIAGNOSIS — Z0000 Encounter for general adult medical examination without abnormal findings: Secondary | ICD-10-CM

## 2016-07-15 DIAGNOSIS — L608 Other nail disorders: Secondary | ICD-10-CM

## 2016-07-15 LAB — POCT GLYCOSYLATED HEMOGLOBIN (HGB A1C): Hemoglobin A1C: 11.7

## 2016-07-15 LAB — POCT UA - MICROALBUMIN
CREATININE, POC: 200 mg/dL
Microalbumin Ur, POC: 150 mg/L

## 2016-07-15 LAB — GLUCOSE, POCT (MANUAL RESULT ENTRY)
POC GLUCOSE: 296 mg/dL — AB (ref 70–99)
POC Glucose: 246 mg/dl — AB (ref 70–99)

## 2016-07-15 MED ORDER — INSULIN SYRINGES (DISPOSABLE) U-100 1 ML MISC: 1.0000 | Freq: Once | 11 refills | Status: AC

## 2016-07-15 MED ORDER — PNEUMOCOCCAL 13-VAL CONJ VACC IM SUSP: 0.5000 mL | INTRAMUSCULAR | Status: DC

## 2016-07-15 MED ORDER — INSULIN ASPART 100 UNIT/ML ~~LOC~~ SOLN
10.0000 [IU] | Freq: Once | SUBCUTANEOUS | Status: AC
  Administered 2016-07-15: 10 [IU] via SUBCUTANEOUS

## 2016-07-15 MED ORDER — INSULIN GLARGINE 100 UNIT/ML ~~LOC~~ SOLN: 10.0000 [IU] | Freq: Every day | SUBCUTANEOUS | 11 refills | Status: DC

## 2016-07-15 MED ORDER — PNEUMOCOCCAL 13-VAL CONJ VACC IM SUSP
0.5000 mL | Freq: Once | INTRAMUSCULAR | Status: AC
  Administered 2016-07-15: 0.5 mL via INTRAMUSCULAR

## 2016-07-15 MED ORDER — METFORMIN HCL ER 500 MG PO TB24: 1000.0000 mg | ORAL_TABLET | Freq: Two times a day (BID) | ORAL | 2 refills | Status: DC

## 2016-07-15 NOTE — Progress Notes (Signed)
Subjective:  Patient ID: Tonya Paul, female    DOB: 1975-04-26  Age: 41 y.o. MRN: [id number]  CC: No chief complaint on file.   HPI Korynne Dols presents for   Diabetes Mellitus: Patient presents for follow up of diabetes. Symptoms: hyperglycemia, nausea and visual disturbances. Symptoms have gradually worsened. She reports history of GERD. Patient denies foot ulcerations, nausea, paresthesia of the feet and vomitting.  Evaluation to date has been included: hemoglobin A1C and microalbuminuria.  Reports not consistently checking her blood glucose. Home sugars: BGs range between 250 and 410. Treatment to date: Continued metformin which has been ineffective.    Outpatient Medications Prior to Visit  Medication Sig Dispense Refill  . Blood Glucose Monitoring Suppl (TRUE METRIX METER) DEVI 1 each by Does not apply route 3 (three) times daily before meals. 1 Device 0  . glucose blood (TRUE METRIX BLOOD GLUCOSE TEST) test strip Used 3 times daily as directed before meals 100 each 12  . meclizine (ANTIVERT) 25 MG tablet Take 25 mg by mouth 3 (three) times daily as needed for dizziness or nausea.    Marland Kitchen omeprazole (PRILOSEC) 20 MG capsule Take 1 capsule (20 mg total) by mouth daily. 30 capsule 3  . ondansetron (ZOFRAN ODT) 4 MG disintegrating tablet Take 1 tablet (4 mg total) by mouth every 8 (eight) hours as needed for nausea or vomiting. 20 tablet 0  . propranolol ER (INDERAL LA) 80 MG 24 hr capsule Take 1 capsule (80 mg total) by mouth daily. 30 capsule 6  . TRUEPLUS LANCETS 28G MISC 1 each by Does not apply route 3 (three) times daily before meals. 100 each 12  . canagliflozin (INVOKANA) 100 MG TABS tablet Take 1 tablet (100 mg total) by mouth daily before breakfast. 30 tablet 2  . metFORMIN (GLUCOPHAGE) 500 MG tablet Take 500 mg by mouth 2 (two) times daily with a meal.     No facility-administered medications prior to visit.     ROS Review of Systems  Constitutional: Negative.   Eyes:  Positive for visual disturbance.  Respiratory: Negative.   Cardiovascular: Negative.   Gastrointestinal: Negative.   Skin: Negative.   Neurological: Negative.     Objective:  BP 119/80 (BP Location: Left Arm, Patient Position: Sitting, Cuff Size: Normal)   Pulse 85   Temp 98.4 F (36.9 C) (Oral)   Resp 18   Ht _0  (1.803 m)   Wt 211 lb 9.6 oz (96 kg)   SpO2 97%   BMI 29.51 kg/m   BP/Weight 07/15/2016 04/22/2016 38/12/1749  Systolic BP 025 852 778  Diastolic BP 80 81 89  Wt. (Lbs) 211.6 210 210  BMI 29.51 30.13 30.13     Physical Exam  Constitutional: She is oriented to person, place, and time.  Eyes: Conjunctivae are normal. Pupils are equal, round, and reactive to light.  Neck: Normal range of motion. No JVD present.  Cardiovascular: Normal rate, regular rhythm, normal heart sounds and intact distal pulses.   Pulmonary/Chest: Effort normal and breath sounds normal.  Abdominal: Soft. Bowel sounds are normal.  Neurological: She is alert and oriented to person, place, and time.  Skin: Skin is warm and dry.  Psychiatric: She has a normal mood and affect.  Nursing note and vitals reviewed.   Diabetic Foot Exam - Simple   Simple Foot Form Diabetic Foot exam was performed with the following findings:  Yes 07/15/2016  2:27 PM  Visual Inspection No deformities, no ulcerations, no other skin  breakdown bilaterally:  Yes See comments:  Yes Sensation Testing Intact to touch and monofilament testing bilaterally:  Yes Pulse Check Posterior Tibialis and Dorsalis pulse intact bilaterally:  Yes Comments Right great toe, toenail is brown, thickened and covers only half of nail bed.      Assessment & Plan:   Problem List Items Addressed This Visit      Endocrine   Type 2 diabetes mellitus, uncontrolled ([place]) - Primary   Increased dosage of metaformin   Added basal insulin   Start checking CBG's TID and keep log to bring to next office visit.    Vision screen and referral  to opthalmology   Relevant Medications   insulin aspart (novoLOG) injection 10 Units (Completed)   metFORMIN (GLUCOPHAGE XR) 500 MG 24 hr tablet   insulin glargine (LANTUS) 100 UNIT/ML injection   Other Relevant Orders   Glucose (CBG) (Completed)   HgB A1c (Completed)   H. pylori breath test (Completed)   CMP14+EGFR (Completed)   Lipid Panel (Completed)   POCT UA - Microalbumin (Completed)   Glucose (CBG) (Completed)    Other Visit Diagnoses    Health care maintenance       Relevant Medications   pneumococcal 13-valent conjugate vaccine (PREVNAR 13) injection 0.5 mL (Completed)   Toenail deformity       Relevant Orders   Ambulatory referral to Podiatry      Meds ordered this encounter  Medications  . insulin aspart (novoLOG) injection 10 Units  . DISCONTD: pneumococcal 13-valent conjugate vaccine (PREVNAR 13) injection 0.5 mL  . metFORMIN (GLUCOPHAGE XR) 500 MG 24 hr tablet    Sig: Take 2 tablets (1,000 mg total) by mouth 2 (two) times daily with a meal.    Dispense:  120 tablet    Refill:  2    Order Specific Question:   Supervising Provider    Answer:   Tresa Garter [id number]  . insulin glargine (LANTUS) 100 UNIT/ML injection    Sig: Inject 0.1 mLs (10 Units total) into the skin at bedtime.    Dispense:  10 mL    Refill:  11    Order Specific Question:   Supervising Provider    Answer:   Tresa Garter [id number]  . Insulin Syringes, Disposable, U-100 1 ML MISC    Sig: 1 kit by Does not apply route once.    Dispense:  100 each    Refill:  11    Order Specific Question:   Supervising Provider    Answer:   Tresa Garter [id number]  . pneumococcal 13-valent conjugate vaccine (PREVNAR 13) injection 0.5 mL    Follow-up: Return in about 2 weeks (around 07/29/2016) for DM.   Alfonse Spruce FNP

## 2016-07-15 NOTE — Progress Notes (Signed)
Patient is here for F/up  Patient denies pain for today  Patient has only ate a banana for today  Patient has taking her meds for today

## 2016-07-15 NOTE — Patient Instructions (Signed)
Type 2 Diabetes Mellitus, Self Care, Adult When you have type 2 diabetes (type 2 diabetes mellitus), you must keep your blood sugar (glucose) under control. You can do this with:  Nutrition.  Exercise.  Lifestyle changes.  Medicines or insulin, if needed.  Support from your doctors and others. How do I manage my blood sugar?  Check your blood sugar level every day, as often as told.  Call your doctor if your blood sugar is above your goal numbers for 2 tests in a row.  Have your A1c (hemoglobin A1c) level checked at least two times a year. Have it checked more often if your doctor tells you to. Your doctor will set treatment goals for you. Generally, you should have these blood sugar levels:  Before meals (preprandial): 80-130 mg/dL (4.4-7.2 mmol/L).  After meals (postprandial): lower than 180 mg/dL (10 mmol/L).  A1c level: less than 7%. What do I need to know about high blood sugar? High blood sugar is called hyperglycemia. Know the signs of high blood sugar. Signs may include:  Feeling:  Thirsty.  Hungry.  Very tired.  Needing to pee (urinate) more than usual.  Blurry vision. What do I need to know about low blood sugar? Low blood sugar is called hypoglycemia. This is when blood sugar is at or below 70 mg/dL (3.9 mmol/L). Symptoms may include:  Feeling:  Hungry.  Worried or nervous (anxious).  Sweaty and clammy.  Confused.  Dizzy.  Sleepy.  Sick to your stomach (nauseous).  Having:  A fast heartbeat (palpitations).  A headache.  A change in your vision.  Jerky movements that you cannot control (seizure).  Nightmares.  Tingling or no feeling (numbness) around the mouth, lips, or tongue.  Having trouble with:  Talking.  Paying attention (concentrating).  Moving (coordination).  Sleeping.  Shaking.  Passing out (fainting).  Getting upset easily (irritability). Treating low blood sugar  To treat low blood sugar, eat or drink  something sugary right away. If you can think clearly and swallow safely, follow the 15:15 rule:  Take 15 grams of a fast-acting carb (carbohydrate). Some fast-acting carbs are:  1 tube of glucose gel.  3 sugar tablets (glucose pills).  6-8 pieces of hard candy.  4 oz (120 mL) of fruit juice.  4 oz (120 mL) regular (not diet) soda.  Check your blood sugar 15 minutes after you take the carb.  If your blood sugar is still at or below 70 mg/dL (3.9 mmol/L), take 15 grams of a carb again.  If your blood sugar does not go above 70 mg/dL (3.9 mmol/L) after 3 tries, get help right away.  After your blood sugar goes back to normal, eat a meal or a snack within 1 hour. Treating very low blood sugar  If your blood sugar is at or below 54 mg/dL (3 mmol/L), you have very low blood sugar (severe hypoglycemia). This is an emergency. Do not wait to see if the symptoms will go away. Get medical help right away. Call your local emergency services (911 in the [place] Do not drive yourself to the hospital. If you have very low blood sugar and you cannot eat or drink, you may need a glucagon shot (injection). A family member or friend should learn how to check your blood sugar and how to give you a glucagon shot. Ask your doctor if you need to have a glucagon shot kit at home. What else is important to manage my diabetes? Medicine  Follow these instructions   about insulin and diabetes medicines:  Take them as told by your doctor.  Adjust them as told by your doctor.  Do not run out of them. Having diabetes can raise your risk for other long-term conditions. These include heart or kidney disease. Your doctor may prescribe medicines to help prevent problems from diabetes. Food   Make healthy food choices. These include:  Chicken, fish, egg whites, and beans.  Oats, whole wheat, bulgur, brown rice, quinoa, and millet.  Fresh fruits and vegetables.  Low-fat dairy products.  Nuts, avocado, olive  oil, and canola oil.  Make a food plan with a specialist (dietitian).  Follow instructions from your doctor about what you cannot eat or drink.  Drink enough fluid to keep your pee (urine) clear or pale yellow.  Eat healthy snacks between healthy meals.  Keep track of carbs that you eat. Read food labels. Learn food serving sizes.  Follow your sick day plan when you cannot eat or drink normally. Make this plan with your doctor so it is ready to use. Activity  Exercise at least 3 times a week.  Do not go more than 2 days without exercising.  Talk with your doctor before you start a new exercise. Your doctor may need to adjust your insulin, medicines, or food. Lifestyle   Do not use any tobacco products. These include cigarettes, chewing tobacco, and e-cigarettes.If you need help quitting, ask your doctor.  Ask your doctor how much alcohol is safe for you.  Learn to deal with stress. If you need help with this, ask your doctor. Body care  Stay up to date with your shots (immunizations).  Have your eyes and feet checked by a doctor as often as told.  Check your skin and feet every day. Check for cuts, bruises, redness, blisters, or sores.  Brush your teeth and gums two times a day.  Floss at least one time a day.  Go to the dentist least one time every 6 months.  Stay at a healthy weight. General instructions   Take over-the-counter and prescription medicines only as told by your doctor.  Share your diabetes care plan with:  Your work or school.  People you live with.  Check your pee (urine) for ketones:  When you are sick.  As told by your doctor.  Carry a card or wear jewelry that says that you have diabetes.  Ask your doctor:  Do I need to meet with a diabetes educator?  Where can I find a support group for people with diabetes?  Keep all follow-up visits as told by your doctor. This is important. Where to find more information: To learn more about  diabetes, visit:  American Diabetes Association: www.diabetes.org  American Association of Diabetes Educators: www.diabeteseducator.org/patient-resources This information is not intended to replace advice given to you by your health care provider. Make sure you discuss any questions you have with your health care provider. Document Released: 06/30/2015 Document Revised: 08/14/2015 Document Reviewed: 04/11/2015 Elsevier Interactive Patient Education  2017 [employer]vier Inc.  

## 2016-07-16 LAB — CMP14+EGFR
ALBUMIN: 4.4 g/dL (ref 3.5–5.5)
ALK PHOS: 74 IU/L (ref 39–117)
ALT: 35 IU/L — AB (ref 0–32)
AST: 21 IU/L (ref 0–40)
Albumin/Globulin Ratio: 1.5 (ref 1.2–2.2)
BUN / CREAT RATIO: 11 (ref 9–23)
BUN: 8 mg/dL (ref 6–24)
Bilirubin Total: <0.2 mg/dL (ref 0.0–1.2)
CHLORIDE: 98 mmol/L (ref 96–106)
CO2: 22 mmol/L (ref 18–29)
CREATININE: 0.73 mg/dL (ref 0.57–1.00)
Calcium: 9.5 mg/dL (ref 8.7–10.2)
GFR calc non Af Amer: 103 mL/min/{1.73_m2} (ref >59)
GFR, EST AFRICAN AMERICAN: 119 mL/min/{1.73_m2} (ref >59)
GLUCOSE: 251 mg/dL — AB (ref 65–99)
Globulin, Total: 2.9 g/dL (ref 1.5–4.5)
Potassium: 4.4 mmol/L (ref 3.5–5.2)
Sodium: 136 mmol/L (ref 134–144)
TOTAL PROTEIN: 7.3 g/dL (ref 6.0–8.5)

## 2016-07-16 LAB — LIPID PANEL
CHOLESTEROL TOTAL: 161 mg/dL (ref 100–199)
Chol/HDL Ratio: 4 ratio (ref 0.0–4.4)
HDL: 40 mg/dL (ref >39)
LDL CALC: 98 mg/dL (ref 0–99)
Triglycerides: 114 mg/dL (ref 0–149)
VLDL CHOLESTEROL CAL: 23 mg/dL (ref 5–40)

## 2016-07-17 LAB — H.PYLORI BREATH TEST (REFLEX): H. PYLORI BREATH TEST: NEGATIVE

## 2016-07-17 LAB — H. PYLORI BREATH TEST

## 2016-07-20 ENCOUNTER — Other Ambulatory Visit: Payer: Self-pay | Admitting: Family Medicine

## 2016-07-20 ENCOUNTER — Telehealth: Payer: Self-pay | Admitting: Family Medicine

## 2016-07-20 ENCOUNTER — Encounter: Payer: Self-pay | Admitting: Family Medicine

## 2016-07-20 DIAGNOSIS — IMO0001 Reserved for inherently not codable concepts without codable children: Secondary | ICD-10-CM

## 2016-07-20 DIAGNOSIS — Z9189 Other specified personal risk factors, not elsewhere classified: Secondary | ICD-10-CM

## 2016-07-20 DIAGNOSIS — H538 Other visual disturbances: Secondary | ICD-10-CM

## 2016-07-20 DIAGNOSIS — E1165 Type 2 diabetes mellitus with hyperglycemia: Principal | ICD-10-CM

## 2016-07-20 MED ORDER — "INSULIN SYRINGE-NEEDLE U-100 31G X 5/16"" 0.5 ML MISC": 1 refills | Status: DC

## 2016-07-20 MED ORDER — ROSUVASTATIN CALCIUM 10 MG PO TABS: 10.0000 mg | ORAL_TABLET | Freq: Every day | ORAL | 0 refills | Status: DC

## 2016-07-20 NOTE — Telephone Encounter (Signed)
Pt calling to state that [facility] prescribed insulin during her visit on 07/16/16 but did not prescribe the needles. Pt requests a script for needles to be sent to the [facility] on [address] in [place], [place]. Pt states that she has no more needles, as she was paying out of pocket for them. Please f/u. Thank you.

## 2016-07-20 NOTE — Telephone Encounter (Signed)
Insulin syringes sent to [facility]

## 2016-07-21 ENCOUNTER — Other Ambulatory Visit: Payer: Self-pay | Admitting: Family Medicine

## 2016-07-21 DIAGNOSIS — Z1231 Encounter for screening mammogram for malignant neoplasm of breast: Secondary | ICD-10-CM

## 2016-07-21 NOTE — Telephone Encounter (Signed)
CMA call regarding lab results   Patient Verify DOB   Patient was aware and understood  

## 2016-07-21 NOTE — Telephone Encounter (Signed)
-----   Message from Alfonse Spruce, [place] sent at 07/20/2016  4:18 PM EDT ----- Kidney function normal Liver function normal H.pylori is negative. H.pylori is a bacteria that can infect the stomach and cause stomach ulcers. Lipid levels normal. Diabetes can increase your risk high cholesterol levels which can increase your risk of developing heart disease overtime. You will be prescribed crestor. Recommend recheck in 3 months.

## 2016-07-27 LAB — HM DIABETES EYE EXAM

## 2016-07-30 ENCOUNTER — Encounter: Payer: Self-pay | Admitting: Family Medicine

## 2016-07-30 ENCOUNTER — Encounter: Payer: Self-pay | Admitting: Emergency Medicine

## 2016-07-30 ENCOUNTER — Emergency Department: Payer: Medicaid Other

## 2016-07-30 ENCOUNTER — Ambulatory Visit: Payer: Medicaid Other | Attending: Family Medicine | Admitting: Family Medicine

## 2016-07-30 VITALS — BP 120/79 | HR 86 | Temp 98.4°F | Resp 18 | Ht 71.0 in | Wt 213.2 lb

## 2016-07-30 DIAGNOSIS — R1012 Left upper quadrant pain: Secondary | ICD-10-CM | POA: Diagnosis not present

## 2016-07-30 DIAGNOSIS — Z5321 Procedure and treatment not carried out due to patient leaving prior to being seen by health care provider: Secondary | ICD-10-CM | POA: Diagnosis not present

## 2016-07-30 DIAGNOSIS — I10 Essential (primary) hypertension: Secondary | ICD-10-CM | POA: Diagnosis not present

## 2016-07-30 DIAGNOSIS — Z794 Long term (current) use of insulin: Secondary | ICD-10-CM | POA: Diagnosis not present

## 2016-07-30 DIAGNOSIS — R079 Chest pain, unspecified: Secondary | ICD-10-CM | POA: Diagnosis not present

## 2016-07-30 DIAGNOSIS — R51 Headache: Secondary | ICD-10-CM | POA: Diagnosis present

## 2016-07-30 DIAGNOSIS — K219 Gastro-esophageal reflux disease without esophagitis: Secondary | ICD-10-CM | POA: Insufficient documentation

## 2016-07-30 DIAGNOSIS — E1165 Type 2 diabetes mellitus with hyperglycemia: Secondary | ICD-10-CM

## 2016-07-30 DIAGNOSIS — E119 Type 2 diabetes mellitus without complications: Secondary | ICD-10-CM | POA: Insufficient documentation

## 2016-07-30 DIAGNOSIS — IMO0001 Reserved for inherently not codable concepts without codable children: Secondary | ICD-10-CM

## 2016-07-30 LAB — BASIC METABOLIC PANEL
ANION GAP: 7 (ref 5–15)
BUN: 10 mg/dL (ref 6–20)
CALCIUM: 9.4 mg/dL (ref 8.9–10.3)
CO2: 25 mmol/L (ref 22–32)
CREATININE: 0.82 mg/dL (ref 0.44–1.00)
Chloride: 104 mmol/L (ref 101–111)
GFR calc Af Amer: >60 mL/min (ref >60)
GLUCOSE: 165 mg/dL — AB (ref 65–99)
Potassium: 3.7 mmol/L (ref 3.5–5.1)
Sodium: 136 mmol/L (ref 135–145)

## 2016-07-30 LAB — CBC
HCT: 36.6 % (ref 35.0–47.0)
Hemoglobin: 12.4 g/dL (ref 12.0–16.0)
MCH: 28.9 pg (ref 26.0–34.0)
MCHC: 33.9 g/dL (ref 32.0–36.0)
MCV: 85.3 fL (ref 80.0–100.0)
Platelets: 392 10*3/uL (ref 150–440)
RBC: 4.29 MIL/uL (ref 3.80–5.20)
RDW: 14.3 % (ref 11.5–14.5)
WBC: 10.1 10*3/uL (ref 3.6–11.0)

## 2016-07-30 LAB — GLUCOSE, POCT (MANUAL RESULT ENTRY): POC Glucose: 238 mg/dl — AB (ref 70–99)

## 2016-07-30 LAB — TROPONIN I

## 2016-07-30 MED ORDER — RELION LANCETS STANDARD 21G MISC: 1.0000 | Freq: Once | 5 refills | Status: AC

## 2016-07-30 MED ORDER — INSULIN GLARGINE 100 UNIT/ML ~~LOC~~ SOLN: 25.0000 [IU] | Freq: Every day | SUBCUTANEOUS | 11 refills | Status: DC

## 2016-07-30 MED ORDER — GLUCOSE BLOOD VI STRP: ORAL_STRIP | 12 refills | Status: DC

## 2016-07-30 NOTE — Progress Notes (Signed)
Patient is here for f/up  Patient has taking her medication for today   Patient has eaten for today  Patient denies pain for today   Patient refuse doing a pregnancy test she is 1005 sure she is not pregnant she is on her period

## 2016-07-30 NOTE — Progress Notes (Signed)
Subjective:  Patient ID: Tonya Paul, female    DOB: [birth year]  Age: 41 y.o. MRN: [id number]  CC: Diabetes   HPI Adama Ivins presents for   Diabetes Mellitus: Patient presents for follow up of diabetes. Symptoms: hyperglycemia and nausea. Symptoms have not greatly improved.. She reports history of GERD. Patient denies foot ulcerations, paresthesia of the feet and vomitting.  Evaluation to date has been included: hemoglobin A1C and microalbuminuria.  Reports not consistently checking her blood glucose. Home sugars: BG's 180-220 am, 160-190 afternoon, and 155-200 pm.. Treatment to date: Added insulin which has been somewhat effective and Continued metformin which has been somewhat effective.    Abdominal Pain: Patient complains of abdominal pain. The pain is described as twisting-like pain, and is moderate in intensity. Pain lasts for few minutes to 1 hour. Pain is located in the LUQ without radiation. Pain occurs regardless of meals.  Onset was 2 months ago. Symptoms have been intermittment since. Aggravating factors: None.  Alleviating factors: none. She denies taking anything for symptoms.  Associated symptoms: nausea. The patient denies anorexia, diarrhea, hematochezia, melena, masses,  and vomiting.   Outpatient Medications Prior to Visit  Medication Sig Dispense Refill  . Blood Glucose Monitoring Suppl (TRUE METRIX METER) DEVI 1 each by Does not apply route 3 (three) times daily before meals. 1 Device 0  . Insulin Syringe-Needle U-100 (BD INSULIN SYRINGE ULTRAFINE) 31G X 5/16" 0.5 ML MISC Use as directed daily for insulin administration 100 each 1  . meclizine (ANTIVERT) 25 MG tablet Take 25 mg by mouth 3 (three) times daily as needed for dizziness or nausea.    . metFORMIN (GLUCOPHAGE XR) 500 MG 24 hr tablet Take 2 tablets (1,000 mg total) by mouth 2 (two) times daily with a meal. 120 tablet 2  . omeprazole (PRILOSEC) 20 MG capsule Take 1 capsule (20 mg total) by mouth daily. 30  capsule 3  . ondansetron (ZOFRAN ODT) 4 MG disintegrating tablet Take 1 tablet (4 mg total) by mouth every 8 (eight) hours as needed for nausea or vomiting. 20 tablet 0  . propranolol ER (INDERAL LA) 80 MG 24 hr capsule Take 1 capsule (80 mg total) by mouth daily. 30 capsule 6  . rosuvastatin (CRESTOR) 10 MG tablet Take 1 tablet (10 mg total) by mouth daily. 90 tablet 0  . glucose blood (TRUE METRIX BLOOD GLUCOSE TEST) test strip Used 3 times daily as directed before meals 100 each 12  . insulin glargine (LANTUS) 100 UNIT/ML injection Inject 0.1 mLs (10 Units total) into the skin at bedtime. 10 mL 11  . TRUEPLUS LANCETS 28G MISC 1 each by Does not apply route 3 (three) times daily before meals. 100 each 12   No facility-administered medications prior to visit.     ROS Review of Systems  Constitutional: Negative.   Eyes: Negative.   Respiratory: Negative.   Cardiovascular: Negative.   Gastrointestinal: Negative.   Skin: Negative.   Neurological: Negative.     Objective:  BP 120/79 (BP Location: Left Arm)   Pulse 86   Temp 98.4 F (36.9 C) (Oral)   Resp 18   Ht _0  (1.803 m)   Wt 213 lb 3.2 oz (96.7 kg)   LMP 07/30/2016   SpO2 98%   BMI 29.74 kg/m   BP/Weight 07/31/2016 07/30/2016 09/19/1599  Systolic BP - 093 -  Diastolic BP - 99 -  Wt. (Lbs) - 213 -  BMI 29.71 - 29.74   Physical Exam  Constitutional: She is oriented to person, place, and time.  Eyes: Conjunctivae are normal. Pupils are equal, round, and reactive to light.  Neck: Normal range of motion. No JVD present.  Cardiovascular: Normal rate, regular rhythm, normal heart sounds and intact distal pulses.   Pulmonary/Chest: Effort normal and breath sounds normal.  Abdominal: Soft. Bowel sounds are normal.  Neurological: She is alert and oriented to person, place, and time.  Skin: Skin is warm and dry.  Psychiatric: She has a normal mood and affect.  Nursing note and vitals reviewed.   Assessment & Plan:    Problem List Items Addressed This Visit      Endocrine   Type 2 diabetes mellitus, uncontrolled ([place]) - Primary   Increase lantus from 10 to 25 units at [facility].    Continue to check CBG's TID. Bring glucometer and log to follow up visit.   Follow up in 2 weeks with clinical pharmacist.   Follow up in 3 months with PCP.   Relevant Medications   glucose blood (RELION GLUCOSE TEST STRIPS) test strip   insulin glargine (LANTUS) 100 UNIT/ML injection   Other Relevant Orders   Glucose (CBG) (Completed)    Other Visit Diagnoses    LUQ abdominal pain       Relevant Orders   CBC with Differential (Completed)   Lipase (Completed)   CMP14+EGFR (Completed)   DG Abd 2 Views   POCT urine pregnancy       Meds ordered this encounter  Medications  . DISCONTD: insulin glargine (LANTUS) 100 UNIT/ML injection    Sig: Inject 0.25 mLs (25 Units total) into the skin at bedtime.    Dispense:  10 mL    Refill:  11    Order Specific Question:   Supervising Provider    Answer:   Tresa Garter [id number]  . glucose blood (RELION GLUCOSE TEST STRIPS) test strip    Sig: Use as instructed    Dispense:  100 each    Refill:  12    Order Specific Question:   Supervising Provider    Answer:   Tresa Garter [[id number]]  . RELION LANCETS STANDARD 21G MISC    Sig: 1 kit by Does not apply route once.    Dispense:  200 each    Refill:  5    Order Specific Question:   Supervising Provider    Answer:   Tresa Garter [id number]  . insulin glargine (LANTUS) 100 UNIT/ML injection    Sig: Inject 0.25 mLs (25 Units total) into the skin at bedtime.    Dispense:  10 mL    Refill:  11    Order Specific Question:   Supervising Provider    Answer:   Tresa Garter [[id number]]    Follow-up: Return in about 2 weeks (around 08/13/2016) for DM with Stacy.   Alfonse Spruce FNP

## 2016-07-30 NOTE — Patient Instructions (Signed)
Start checking blood sugars three time a day. Bring glucometer and blood glucose log to next office visit.  You will be called with your labs results.     Type 2 Diabetes Mellitus, Self Care, Adult When you have type 2 diabetes (type 2 diabetes mellitus), you must keep your blood sugar (glucose) under control. You can do this with:  Nutrition.  Exercise.  Lifestyle changes.  Medicines or insulin, if needed.  Support from your doctors and others. How do I manage my blood sugar?  Check your blood sugar level every day, as often as told.  Call your doctor if your blood sugar is above your goal numbers for 2 tests in a row.  Have your A1c (hemoglobin A1c) level checked at least two times a year. Have it checked more often if your doctor tells you to. Your doctor will set treatment goals for you. Generally, you should have these blood sugar levels:  Before meals (preprandial): 80-130 mg/dL (4.4-7.2 mmol/L).  After meals (postprandial): lower than 180 mg/dL (10 mmol/L).  A1c level: less than 7%. What do I need to know about high blood sugar? High blood sugar is called hyperglycemia. Know the signs of high blood sugar. Signs may include:  Feeling:  Thirsty.  Hungry.  Very tired.  Needing to pee (urinate) more than usual.  Blurry vision. What do I need to know about low blood sugar? Low blood sugar is called hypoglycemia. This is when blood sugar is at or below 70 mg/dL (3.9 mmol/L). Symptoms may include:  Feeling:  Hungry.  Worried or nervous (anxious).  Sweaty and clammy.  Confused.  Dizzy.  Sleepy.  Sick to your stomach (nauseous).  Having:  A fast heartbeat (palpitations).  A headache.  A change in your vision.  Jerky movements that you cannot control (seizure).  Nightmares.  Tingling or no feeling (numbness) around the mouth, lips, or tongue.  Having trouble with:  Talking.  Paying attention (concentrating).  Moving  (coordination).  Sleeping.  Shaking.  Passing out (fainting).  Getting upset easily (irritability). Treating low blood sugar   To treat low blood sugar, eat or drink something sugary right away. If you can think clearly and swallow safely, follow the 15:15 rule:  Take 15 grams of a fast-acting carb (carbohydrate). Some fast-acting carbs are:  1 tube of glucose gel.  3 sugar tablets (glucose pills).  6-8 pieces of hard candy.  4 oz (120 mL) of fruit juice.  4 oz (120 mL) regular (not diet) soda.  Check your blood sugar 15 minutes after you take the carb.  If your blood sugar is still at or below 70 mg/dL (3.9 mmol/L), take 15 grams of a carb again.  If your blood sugar does not go above 70 mg/dL (3.9 mmol/L) after 3 tries, get help right away.  After your blood sugar goes back to normal, eat a meal or a snack within 1 hour. Treating very low blood sugar  If your blood sugar is at or below 54 mg/dL (3 mmol/L), you have very low blood sugar (severe hypoglycemia). This is an emergency. Do not wait to see if the symptoms will go away. Get medical help right away. Call your local emergency services (911 in the [place] Do not drive yourself to the hospital. If you have very low blood sugar and you cannot eat or drink, you may need a glucagon shot (injection). A family member or friend should learn how to check your blood sugar and how to  give you a glucagon shot. Ask your doctor if you need to have a glucagon shot kit at home. What else is important to manage my diabetes? Medicine  Follow these instructions about insulin and diabetes medicines:  Take them as told by your doctor.  Adjust them as told by your doctor.  Do not run out of them. Having diabetes can raise your risk for other long-term conditions. These include heart or kidney disease. Your doctor may prescribe medicines to help prevent problems from diabetes. Food    Make healthy food choices. These  include:  Chicken, fish, egg whites, and beans.  Oats, whole wheat, bulgur, brown rice, quinoa, and millet.  Fresh fruits and vegetables.  Low-fat dairy products.  Nuts, avocado, olive oil, and canola oil.  Make a food plan with a specialist (dietitian).  Follow instructions from your doctor about what you cannot eat or drink.  Drink enough fluid to keep your pee (urine) clear or pale yellow.  Eat healthy snacks between healthy meals.  Keep track of carbs that you eat. Read food labels. Learn food serving sizes.  Follow your sick day plan when you cannot eat or drink normally. Make this plan with your doctor so it is ready to use. Activity   Exercise at least 3 times a week.  Do not go more than 2 days without exercising.  Talk with your doctor before you start a new exercise. Your doctor may need to adjust your insulin, medicines, or food. Lifestyle    Do not use any tobacco products. These include cigarettes, chewing tobacco, and e-cigarettes.If you need help quitting, ask your doctor.  Ask your doctor how much alcohol is safe for you.  Learn to deal with stress. If you need help with this, ask your doctor. Body care   Stay up to date with your shots (immunizations).  Have your eyes and feet checked by a doctor as often as told.  Check your skin and feet every day. Check for cuts, bruises, redness, blisters, or sores.  Brush your teeth and gums two times a day.  Floss at least one time a day.  Go to the dentist least one time every 6 months.  Stay at a healthy weight. General instructions    Take over-the-counter and prescription medicines only as told by your doctor.  Share your diabetes care plan with:  Your work or school.  People you live with.  Check your pee (urine) for ketones:  When you are sick.  As told by your doctor.  Carry a card or wear jewelry that says that you have diabetes.  Ask your doctor:  Do I need to meet with a  diabetes educator?  Where can I find a support group for people with diabetes?  Keep all follow-up visits as told by your doctor. This is important. Where to find more information: To learn more about diabetes, visit:  American Diabetes Association: www.diabetes.org  American Association of Diabetes Educators: www.diabeteseducator.org/patient-resources This information is not intended to replace advice given to you by your health care provider. Make sure you discuss any questions you have with your health care provider. Document Released: 06/30/2015 Document Revised: 08/14/2015 Document Reviewed: 04/11/2015 Elsevier Interactive Patient Education  2017 [place].    Hypoglycemia  Hypoglycemia is when the sugar (glucose) level in the blood is too low. Symptoms of low blood sugar may include:  Feeling:  Hungry.  Worried or nervous (anxious).  Sweaty and clammy.  Confused.  Dizzy.  Sleepy.  Sick to your stomach (nauseous).  Having:  A fast heartbeat.  A headache.  A change in your vision.  Jerky movements that you cannot control (seizure).  Nightmares.  Tingling or no feeling (numbness) around the mouth, lips, or tongue.  Having trouble with:  Talking.  Paying attention (concentrating).  Moving (coordination).  Sleeping.  Shaking.  Passing out (fainting).  Getting upset easily (irritability). Low blood sugar can happen to people who have diabetes and people who do not have diabetes. Low blood sugar can happen quickly, and it can be an emergency. Treating Low Blood Sugar  Low blood sugar is often treated by eating or drinking something sugary right away. If you can think clearly and swallow safely, follow the 15:15 rule:  Take 15 grams of a fast-acting carb (carbohydrate). Some fast-acting carbs are:  1 tube of glucose gel.  3 sugar tablets (glucose pills).  6-8 pieces of hard candy.  4 oz (120 mL) of fruit juice.  4 oz (120 mL) of regular  (not diet) soda.  Check your blood sugar 15 minutes after you take the carb.  If your blood sugar is still at or below 70 mg/dL (3.9 mmol/L), take 15 grams of a carb again.  If your blood sugar does not go above 70 mg/dL (3.9 mmol/L) after 3 tries, get help right away.  After your blood sugar goes back to normal, eat a meal or a snack within 1 hour. Treating Very Low Blood Sugar  If your blood sugar is at or below 54 mg/dL (3 mmol/L), you have very low blood sugar (severe hypoglycemia). This is an emergency. Do not wait to see if the symptoms will go away. Get medical help right away. Call your local emergency services (911 in the [place] Do not drive yourself to the hospital. If you have very low blood sugar and you cannot eat or drink, you may need a glucagon shot (injection). A family member or friend should learn how to check your blood sugar and how to give you a glucagon shot. Ask your doctor if you need to have a glucagon shot kit at home. Follow these instructions at home: General instructions   Avoid any diets that cause you to not eat enough food. Talk with your doctor before you start any new diet.  Take over-the-counter and prescription medicines only as told by your doctor.  Limit alcohol to no more than 1 drink per day for nonpregnant women and 2 drinks per day for men. One drink equals 12 oz of beer, 5 oz of wine, or 1 oz of hard liquor.  Keep all follow-up visits as told by your doctor. This is important. If You Have Diabetes:    Make sure you know the symptoms of low blood sugar.  Always keep a source of sugar with you, such as:  Sugar.  Sugar tablets.  Glucose gel.  Fruit juice.  Regular soda (not diet soda).  Milk.  Hard candy.  Honey.  Take your medicines as told.  Follow your exercise and meal plan.  Eat on time. Do not skip meals.  Follow your sick day plan when you cannot eat or drink normally. Make this plan ahead of time with your  doctor.  Check your blood sugar as often as told by your doctor. Always check before and after exercise.  Share your diabetes care plan with:  Your work or school.  People you live with.  Check your pee (urine) for ketones:  When you  are sick.  As told by your doctor.  Carry a card or wear jewelry that says you have diabetes. If You Have Low Blood Sugar From Other Causes:    Check your blood sugar as often as told by your doctor.  Follow instructions from your doctor about what you cannot eat or drink. Contact a doctor if:  You have trouble keeping your blood sugar in your target range.  You have low blood sugar often. Get help right away if:  You still have symptoms after you eat or drink something sugary.  Your blood sugar is at or below 54 mg/dL (3 mmol/L).  You have jerky movements that you cannot control.  You pass out. These symptoms may be an emergency. Do not wait to see if the symptoms will go away. Get medical help right away. Call your local emergency services (911 in the [place] Do not drive yourself to the hospital. This information is not intended to replace advice given to you by your health care provider. Make sure you discuss any questions you have with your health care provider. Document Released: 06/02/2009 Document Revised: 08/14/2015 Document Reviewed: 04/11/2015 Elsevier Interactive Patient Education  2017 [employer].

## 2016-07-31 ENCOUNTER — Emergency Department
Admission: EM | Admit: 2016-07-31 | Discharge: 2016-07-31 | Disposition: A | Discharge status: Home / Self Care | Payer: Medicaid Other | Attending: Emergency Medicine | Admitting: Emergency Medicine

## 2016-07-31 LAB — CMP14+EGFR
ALK PHOS: 66 IU/L (ref 39–117)
ALT: 33 IU/L — ABNORMAL HIGH (ref 0–32)
AST: 34 IU/L (ref 0–40)
Albumin/Globulin Ratio: 1.4 (ref 1.2–2.2)
Albumin: 4.1 g/dL (ref 3.5–5.5)
BUN / CREAT RATIO: 10 (ref 9–23)
BUN: 9 mg/dL (ref 6–24)
Bilirubin Total: <0.2 mg/dL (ref 0.0–1.2)
CALCIUM: 9.5 mg/dL (ref 8.7–10.2)
CO2: 21 mmol/L (ref 18–29)
CREATININE: 0.86 mg/dL (ref 0.57–1.00)
Chloride: 100 mmol/L (ref 96–106)
GFR calc Af Amer: 98 mL/min/{1.73_m2} (ref >59)
GFR calc non Af Amer: 85 mL/min/{1.73_m2} (ref >59)
GLUCOSE: 203 mg/dL — AB (ref 65–99)
Globulin, Total: 2.9 g/dL (ref 1.5–4.5)
Potassium: 4.1 mmol/L (ref 3.5–5.2)
Sodium: 139 mmol/L (ref 134–144)
Total Protein: 7 g/dL (ref 6.0–8.5)

## 2016-07-31 LAB — CBC WITH DIFFERENTIAL/PLATELET
BASOS ABS: 0 10*3/uL (ref 0.0–0.2)
Basos: 0 %
EOS (ABSOLUTE): 0.4 10*3/uL (ref 0.0–0.4)
Eos: 5 %
Hematocrit: 36.1 % (ref 34.0–46.6)
Hemoglobin: 12 g/dL (ref 11.1–15.9)
IMMATURE GRANS (ABS): 0 10*3/uL (ref 0.0–0.1)
IMMATURE GRANULOCYTES: 0 %
LYMPHS: 33 %
Lymphocytes Absolute: 2.9 10*3/uL (ref 0.7–3.1)
MCH: 27.9 pg (ref 26.6–33.0)
MCHC: 33.2 g/dL (ref 31.5–35.7)
MCV: 84 fL (ref 79–97)
Monocytes Absolute: 0.8 10*3/uL (ref 0.1–0.9)
Monocytes: 9 %
NEUTROS PCT: 53 %
Neutrophils Absolute: 4.5 10*3/uL (ref 1.4–7.0)
PLATELETS: 428 10*3/uL — AB (ref 150–379)
RBC: 4.3 x10E6/uL (ref 3.77–5.28)
RDW: 14.3 % (ref 12.3–15.4)
WBC: 8.6 10*3/uL (ref 3.4–10.8)

## 2016-07-31 LAB — LIPASE: LIPASE: 40 U/L (ref 14–72)

## 2016-08-02 ENCOUNTER — Telehealth: Payer: Self-pay | Admitting: Emergency Medicine

## 2016-08-03 ENCOUNTER — Telehealth: Payer: Self-pay | Admitting: Physician Assistant

## 2016-08-04 ENCOUNTER — Encounter: Payer: Self-pay | Admitting: Podiatry

## 2016-08-04 ENCOUNTER — Ambulatory Visit (INDEPENDENT_AMBULATORY_CARE_PROVIDER_SITE_OTHER): Payer: Medicaid Other | Admitting: Podiatry

## 2016-08-04 DIAGNOSIS — L603 Nail dystrophy: Secondary | ICD-10-CM
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
## 2016-08-05 NOTE — Telephone Encounter (Signed)
close encounter °

## 2016-08-06 ENCOUNTER — Ambulatory Visit: Payer: Medicaid Other | Admitting: Physician Assistant

## 2016-08-09 NOTE — Telephone Encounter (Signed)
-----   Message from Alfonse Spruce, [place] sent at 08/09/2016 10:01 AM EDT ----- Labs that evaluated your blood cells, fluid and electrolyte balance are normal. No signs of anemia, infection, or inflammation present. Liver function normal Kidney function normal Lipase is normal. When lipase is elevated it can indicate pancreatitis.

## 2016-08-09 NOTE — Telephone Encounter (Signed)
CMA call regarding lab results   Patient Verify DOB   Patient was aware and understood  

## 2016-08-12 ENCOUNTER — Ambulatory Visit: Payer: Medicaid Other | Attending: Family Medicine | Admitting: Pharmacist

## 2016-08-12 DIAGNOSIS — Z794 Long term (current) use of insulin: Secondary | ICD-10-CM | POA: Diagnosis not present

## 2016-08-12 DIAGNOSIS — E1165 Type 2 diabetes mellitus with hyperglycemia: Secondary | ICD-10-CM

## 2016-08-12 DIAGNOSIS — E119 Type 2 diabetes mellitus without complications: Secondary | ICD-10-CM | POA: Diagnosis not present

## 2016-08-12 DIAGNOSIS — IMO0001 Reserved for inherently not codable concepts without codable children: Secondary | ICD-10-CM

## 2016-08-12 MED ORDER — INSULIN GLARGINE 100 UNIT/ML ~~LOC~~ SOLN: 22.0000 [IU] | Freq: Every day | SUBCUTANEOUS | 11 refills | Status: DC

## 2016-08-12 NOTE — Progress Notes (Signed)
    S:     Chief Complaint  Patient presents with  . Medication Management    Patient arrives in good spirits.  Presents for diabetes evaluation, education, and management at the request of [facility]/Dr. Jegede. Patient was referred on 07/30/16.  Patient was last seen by Primary Care Provider on 07/30/16.   Patient reports adherence with medications.  Current diabetes medications include: Lantus 20 units daily, metformin 500 mg XL 2 tablets twice daily. She did take the 25 units of Lantus for a few days but had hypoglycemia.  Patient reports hypoglycemic events.  Patient reported dietary habits: Eats 3 meals/day. She reports having Fruity Pebbles today. She eats carbs, including fruits. She eats waffles and chips. Patient frequently snacks in the middle of the night.   Patient denies nocturia.  Patient denies neuropathy. Patient denies visual changes. Patient reports self foot exams.    O:  Physical Exam   ROS   Lab Results  Component Value Date   HGBA1C 11.7 07/15/2016   There were no vitals filed for this visit.  Home fasting CBG: 129 - 176 2 hour post-prandial/random CBG:146-258  A/P: Diabetes longstanding currently uncontrolled based on A1c of 11.7 but under improved control based on home CBGs. Patient reports hypoglycemic events and is able to verbalize appropriate hypoglycemia management plan. Patient reports adherence with medication. Control is suboptimal due to dietary indiscretion and sedentary lifestyle.  Increase Lantus to 22 units daily since 25 units led to a reading of 65. Continue metformin. Provided dietary counseling and information on low carb diets. The middle of the night snacks is likely leading to the higher fastings but patient is not sure she can stop snacking so will increase the dose of the Lantus to hopefully get her fastings to goal. Next A1c due August 2018.  Written patient instructions provided.  Total time in face to face counseling  20 minutes.   Follow up in Pharmacist Clinic Visit in 2 weeks.   Patient seen with Sallyanne Havers, PharmD Candidate

## 2016-08-12 NOTE — Patient Instructions (Addendum)
Thanks for coming to see [place]!  Increase Lantus to 22 units daily.  Come back in 2 weeks for diabetes follow up   Carbohydrate Counting for Diabetes Mellitus, Adult Carbohydrate counting is a method for keeping track of how many carbohydrates you eat. Eating carbohydrates naturally increases the amount of sugar (glucose) in the blood. Counting how many carbohydrates you eat helps keep your blood glucose within normal limits, which helps you manage your diabetes (diabetes mellitus). It is important to know how many carbohydrates you can safely have in each meal. This is different for every person. A diet and nutrition specialist (registered dietitian) can help you make a meal plan and calculate how many carbohydrates you should have at each meal and snack. Carbohydrates are found in the following foods:  Grains, such as breads and cereals.  Dried beans and soy products.  Starchy vegetables, such as potatoes, peas, and corn.  Fruit and fruit juices.  Milk and yogurt.  Sweets and snack foods, such as cake, cookies, candy, chips, and soft drinks. How do I count carbohydrates? There are two ways to count carbohydrates in food. You can use either of the methods or a combination of both. Reading "Nutrition Facts" on packaged food  The "Nutrition Facts" list is included on the labels of almost all packaged foods and beverages in the [place] It includes:  The serving size.  Information about nutrients in each serving, including the grams (g) of carbohydrate per serving. To use the "Nutrition Facts":  Decide how many servings you will have.  Multiply the number of servings by the number of carbohydrates per serving.  The resulting number is the total amount of carbohydrates that you will be having. Learning standard serving sizes of other foods  When you eat foods containing carbohydrates that are not packaged or do not include "Nutrition Facts" on the label, you need to measure the servings  in order to count the amount of carbohydrates:  Measure the foods that you will eat with a food scale or measuring cup, if needed.  Decide how many standard-size servings you will eat.  Multiply the number of servings by 15. Most carbohydrate-rich foods have about 15 g of carbohydrates per serving.  For example, if you eat 8 oz (170 g) of strawberries, you will have eaten 2 servings and 30 g of carbohydrates (2 servings x 15 g = 30 g).  For foods that have more than one food mixed, such as soups and casseroles, you must count the carbohydrates in each food that is included. The following list contains standard serving sizes of common carbohydrate-rich foods. Each of these servings has about 15 g of carbohydrates:   hamburger bun or  English muffin.   oz (15 mL) syrup.   oz (14 g) jelly.  1 slice of bread.  1 six-inch tortilla.  3 oz (85 g) cooked rice or pasta.  4 oz (113 g) cooked dried beans.  4 oz (113 g) starchy vegetable, such as peas, corn, or potatoes.  4 oz (113 g) hot cereal.  4 oz (113 g) mashed potatoes or  of a large baked potato.  4 oz (113 g) canned or frozen fruit.  4 oz (120 mL) fruit juice.  4-6 crackers.  6 chicken nuggets.  6 oz (170 g) unsweetened dry cereal.  6 oz (170 g) plain fat-free yogurt or yogurt sweetened with artificial sweeteners.  8 oz (240 mL) milk.  8 oz (170 g) fresh fruit or one small piece  of fruit.  24 oz (680 g) popped popcorn. Example of carbohydrate counting Sample meal   3 oz (85 g) chicken breast.  6 oz (170 g) brown rice.  4 oz (113 g) corn.  8 oz (240 mL) milk.  8 oz (170 g) strawberries with sugar-free whipped topping. Carbohydrate calculation  1. Identify the foods that contain carbohydrates:  Rice.  Corn.  Milk.  Strawberries. 2. Calculate how many servings you have of each food:  2 servings rice.  1 serving corn.  1 serving milk.  1 serving strawberries. 3. Multiply each number of  servings by 15 g:  2 servings rice x 15 g = 30 g.  1 serving corn x 15 g = 15 g.  1 serving milk x 15 g = 15 g.  1 serving strawberries x 15 g = 15 g. 4. Add together all of the amounts to find the total grams of carbohydrates eaten:  30 g + 15 g + 15 g + 15 g = 75 g of carbohydrates total. This information is not intended to replace advice given to you by your health care provider. Make sure you discuss any questions you have with your health care provider. Document Released: 03/08/2005 Document Revised: 09/26/2015 Document Reviewed: 08/20/2015 Elsevier Interactive Patient Education  2017 [employer].

## 2016-08-17 ENCOUNTER — Other Ambulatory Visit: Payer: Self-pay | Admitting: Family Medicine

## 2016-08-17 DIAGNOSIS — IMO0001 Reserved for inherently not codable concepts without codable children: Secondary | ICD-10-CM

## 2016-08-17 DIAGNOSIS — E1165 Type 2 diabetes mellitus with hyperglycemia: Principal | ICD-10-CM

## 2016-08-18 ENCOUNTER — Ambulatory Visit (INDEPENDENT_AMBULATORY_CARE_PROVIDER_SITE_OTHER): Payer: Medicaid Other | Admitting: Physician Assistant

## 2016-08-18 VITALS — BP 138/92 | HR 85 | Ht 71.0 in | Wt 215.4 lb

## 2016-08-18 DIAGNOSIS — R002 Palpitations: Secondary | ICD-10-CM | POA: Diagnosis not present

## 2016-08-18 DIAGNOSIS — Z794 Long term (current) use of insulin: Secondary | ICD-10-CM

## 2016-08-18 DIAGNOSIS — R0789 Other chest pain: Secondary | ICD-10-CM | POA: Diagnosis not present

## 2016-08-18 DIAGNOSIS — E119 Type 2 diabetes mellitus without complications: Secondary | ICD-10-CM

## 2016-08-18 DIAGNOSIS — I4581 Long QT syndrome: Secondary | ICD-10-CM

## 2016-08-18 DIAGNOSIS — I10 Essential (primary) hypertension: Secondary | ICD-10-CM

## 2016-08-18 MED ORDER — PROPRANOLOL HCL ER 120 MG PO CP24: 120.0000 mg | ORAL_CAPSULE | Freq: Every day | ORAL | 5 refills | Status: DC

## 2016-08-18 NOTE — Progress Notes (Addendum)
Cardiology Office Note    Date:  08/19/2016   ID:  Tonya Paul, DOB [birth year], MRN [id number]  PCP:  Alfonse Spruce, FNP  Cardiologist:  Dr. Gwenlyn Found  Chief Complaint  Patient presents with  . Follow-up    pt c/o chest pain stated she went to ER    History of Present Illness:  Tonya Paul is a 41 y.o. female with PMH of HTN, DM II and prolonged QT syndrome. She was initially self referred for evaluation of inheritable causes of sudden cardiac death. Her 80 year old daughters died suddenly on 2012/11/30 for unclear reasons. Her fourth cousin did as well. She never had a heart attack or stroke herself. Echocardiogram obtained on 12/12/2015 showed normal EF, grade 1 diastolic dysfunction, mild-to-moderate TR, PA peak pressure 32 mmHg. She underwent a routine GXT on 02/26/2016, she had no angina during the exertion. She was referred and there was seen by Dr. Caryl Comes on 02/26/2016. Dr. Caryl Comes did review the GXT result as well, baseline QTc interval was 438 and a second, during minute to an 4 of recovery, QTC lengthened to more than 470 ms which is associated with a 70% positive predictive value for LQT 1. One strategy is to add beta blocker, however she has diabetes, and it would be ideal to be on ARB instead. Her son was referred to Dr. Ermalene Searing EP with [facility] system for further eval.   She presents to the cardiology office for 3 month follow-up. She says her son has been evaluated by Dr. Meda Coffee, so far all workup has been negative. She denies any significant dizziness. She has been having episodes of prolonged chest pain in the last several months. However despite occurrence of chest pain, she can exercise on the treadmill for 30 minutes on a daily basis without any discomfort. Her chest pain is worse with deep palpation and it can sometimes last from hours to days at a time. I think this is very atypical for angina, given recent normal EKG and previously negative stress test, I did not recommend  additional workup at this time. She continued to have palpitation 3 times a week, especially when she lay on her left side, I will increase her propranolol 24-hour version to 120 mg daily. She can follow-up in 3 month.   Past Medical History:  Diagnosis Date  . Diabetes mellitus without complication ([place])   . Hypertension   . Prolonged QT syndrome   . Vertigo     No past surgical history on file.  Current Medications: Outpatient Medications Prior to Visit  Medication Sig Dispense Refill  . Blood Glucose Monitoring Suppl (TRUE METRIX METER) DEVI 1 each by Does not apply route 3 (three) times daily before meals. 1 Device 0  . glucose blood (RELION GLUCOSE TEST STRIPS) test strip Use as instructed 100 each 12  . insulin glargine (LANTUS) 100 UNIT/ML injection Inject 0.22 mLs (22 Units total) into the skin daily. 10 mL 0  . Insulin Syringe-Needle U-100 (BD INSULIN SYRINGE ULTRAFINE) 31G X 5/16" 0.5 ML MISC Use as directed daily for insulin administration 100 each 1  . meclizine (ANTIVERT) 25 MG tablet Take 25 mg by mouth 3 (three) times daily as needed for dizziness or nausea.    . metFORMIN (GLUCOPHAGE XR) 500 MG 24 hr tablet Take 2 tablets (1,000 mg total) by mouth 2 (two) times daily with a meal. 120 tablet 2  . omeprazole (PRILOSEC) 20 MG capsule Take 1 capsule (20 mg total) by mouth daily. (  Patient taking differently: Take 20 mg by mouth as needed. ) 30 capsule 3  . rosuvastatin (CRESTOR) 10 MG tablet Take 1 tablet (10 mg total) by mouth daily. 90 tablet 0  . propranolol ER (INDERAL LA) 80 MG 24 hr capsule Take 1 capsule (80 mg total) by mouth daily. 30 capsule 6  . ondansetron (ZOFRAN ODT) 4 MG disintegrating tablet Take 1 tablet (4 mg total) by mouth every 8 (eight) hours as needed for nausea or vomiting. 20 tablet 0   No facility-administered medications prior to visit.      Allergies:   Ace inhibitors; Aspirin; and Ciprofloxacin   Social History   Social History  . Marital  status: Married    Spouse name: N/A  . Number of children: N/A  . Years of education: N/A   Social History Main Topics  . Smoking status: Never Smoker  . Smokeless tobacco: Never Used  . Alcohol use No  . Drug use: No  . Sexual activity: Not Asked   Other Topics Concern  . None   Social History Narrative  . None     Family History:  The patient's family history includes Diabetes in her sister; Hypertension in her mother; Lung cancer in her mother; Lupus in her maternal grandmother; Stroke in her maternal grandfather; Sudden Cardiac Death in her daughter; Throat cancer in her father.   ROS:   Please see the history of present illness.    ROS All other systems reviewed and are negative.   PHYSICAL EXAM:   VS:  BP (!) 138/92   Pulse 85   Ht 5\' 11"  (1.803 m)   Wt 215 lb 6.4 oz (97.7 kg)   LMP 07/30/2016   BMI 30.04 kg/m    GEN: Well nourished, well developed, in no acute distress  HEENT: normal  Neck: no JVD, carotid bruits, or masses Cardiac: RRR; no murmurs, rubs, or gallops,no edema  Respiratory:  clear to auscultation bilaterally, normal work of breathing GI: soft, nontender, nondistended, + BS MS: no deformity or atrophy  Skin: warm and dry, no rash Neuro:  Alert and Oriented x 3, Strength and sensation are intact Psych: euthymic mood, full affect  Wt Readings from Last 3 Encounters:  08/18/16 215 lb 6.4 oz (97.7 kg)  07/30/16 213 lb (96.6 kg)  07/30/16 213 lb 3.2 oz (96.7 kg)      Studies/Labs Reviewed:   EKG:  EKG is not ordered today.    Recent Labs: 09/01/2015: TSH 1.36 07/30/2016: ALT 33; BUN 10; Creatinine, Ser 0.82; Hemoglobin 12.4; Platelets 392; Potassium 3.7; Sodium 136   Lipid Panel    Component Value Date/Time   CHOL 161 07/15/2016 1515   TRIG 114 07/15/2016 1515   HDL 40 07/15/2016 1515   CHOLHDL 4.0 07/15/2016 1515   CHOLHDL 3.6 04/18/2015 0936   VLDL 13 04/18/2015 0936   LDLCALC 98 07/15/2016 1515    Additional studies/ records  that were reviewed today include:   Echo 12/12/2015 - Left ventricle: The cavity size was normal. Systolic function was   normal. Wall motion was normal; there were no regional wall   motion abnormalities. Doppler parameters are consistent with   abnormal left ventricular relaxation (grade 1 diastolic   dysfunction). - Atrial septum: No defect or patent foramen ovale was identified. - Tricuspid valve: There was mild-moderate regurgitation. - Pulmonary arteries: PA peak pressure: 32 mm Hg (S).   ASSESSMENT:    1. Palpitation   2. Long Q-T syndrome  3. Essential hypertension   4. Controlled type 2 diabetes mellitus without complication, with long-term current use of insulin ([place])   5. Atypical chest pain      PLAN:  In order of problems listed above:  1. Palpitation: She continued to have palpitation lasting up to 30 seconds at a time on daily basis. I will increase her propranolol to 120 mg.  2. Long QT syndrome: She has family history of sudden cardiac death. Her GXT obtained in 03/28/2016 has been reviewed by Dr. Caryl Comes who felt she has high probability of long QT syndrome. Her son has been evaluated by Dr. Meda Coffee with [facility], according to the patient's report, all workup has been negative so far. She is aware of medication to avoid with long QT syndrome  3. Atypical chest pain: Lasting hours to days at a time, worse with palpation or lying on the left side. Given negative GXT in 2023/03/29, I would not recommend any additional workup. Her chest discomfort does not sound cardiac in nature.   4. HTN: Blood pressure mildly elevated today, increasing her propranolol to 120 mg will help with blood pressure and palpitation as well.  5. IDDM: On insulin and metformin    Medication Adjustments/Labs and Tests Ordered: Current medicines are reviewed at length with the patient today.  Concerns regarding medicines are outlined above.  Medication changes, Labs and Tests ordered today  are listed in the Patient Instructions below. Patient Instructions  Medication Instructions:   INCREASE propanolol from 80mg  to 120mg . A prescription has been sent to your pharmacy.  Labwork:  none   Testing/Procedures: none   Follow-Up: 3 months with Dr. Gwenlyn Found   If you need a refill on your cardiac medications before your next appointment, please call your pharmacy.      Hilbert Corrigan, [place]  08/19/2016 4:03 PM    [facility] [place], [place], [place]  [address] Phone: [phone]; Fax: [phone]

## 2016-08-18 NOTE — Patient Instructions (Addendum)
Medication Instructions:   INCREASE propanolol from 80mg  to 120mg . A prescription has been sent to your pharmacy.  Labwork:  none   Testing/Procedures: none   Follow-Up: 3 months with Dr. Gwenlyn Found   If you need a refill on your cardiac medications before your next appointment, please call your pharmacy.

## 2016-08-19 ENCOUNTER — Encounter: Payer: Self-pay | Admitting: Physician Assistant

## 2016-08-26 ENCOUNTER — Ambulatory Visit: Payer: Medicaid Other | Attending: Family Medicine | Admitting: Pharmacist

## 2016-08-26 DIAGNOSIS — E1165 Type 2 diabetes mellitus with hyperglycemia: Secondary | ICD-10-CM

## 2016-08-26 DIAGNOSIS — E119 Type 2 diabetes mellitus without complications: Secondary | ICD-10-CM | POA: Diagnosis present

## 2016-08-26 DIAGNOSIS — IMO0001 Reserved for inherently not codable concepts without codable children: Secondary | ICD-10-CM

## 2016-08-26 NOTE — Progress Notes (Signed)
    S:     Chief Complaint  Patient presents with  . Medication Management    Patient arrives in good spirits.  Presents for diabetes evaluation, education, and management at the request of [facility]/Dr. Jegede. Patient was referred on 07/30/16.  Patient was last seen by Primary Care Provider on 07/30/16.   Patient reports adherence with medications.  Current diabetes medications include: Lantus 22 units daily, metformin 500 mg XL 2 tablets twice daily. In the past, Lantus 25 units has caused hypoglycemia.  Patient denies hypoglycemic events.  Patient reported dietary habits: Still eating chips as a bedtime snack.   Found a zumba class that she can go to for exercise.   Patient denies nocturia.  Patient denies neuropathy. Patient denies visual changes. Patient reports self foot exams.    O:  Physical Exam   ROS   Lab Results  Component Value Date   HGBA1C 11.7 07/15/2016   There were no vitals filed for this visit.  Home fasting CBG: 120s-150s 2 hour post-prandial/random CBG:130s-160s, rare 200  A/P: Diabetes longstanding currently uncontrolled based on A1c of 11.7 but under improved control based on home CBGs. Patient reports hypoglycemic events and is able to verbalize appropriate hypoglycemia management plan. Patient reports adherence with medication. Control is suboptimal due to dietary indiscretion and sedentary lifestyle.  Continue current Lantus dose. Continue metformin. Congratulated patient on progress - she is so close to goal so encouraged her to cut the carbs for her bed time snack and start exercising as I think this will get her to goal and might lower her insulin requirements. Patient will come let us know if she has any readings <70.  Next A1c due August 2018.  Written patient instructions provided.  Total time in face to face counseling 20 minutes.   Follow up with Fredia Beets in 2 months.   Patient seen with Maple Mirza, PharmD  Candidate and Mechele Claude, PharmD Candidate

## 2016-08-26 NOTE — Patient Instructions (Addendum)
Thanks for coming to see Tonya Paul  You are doing great! Keep up the good work - try to get low carb snacks at night and then also try to start exercising!  If you start noticing that your blood sugars are going too low after you make these changes, come back and see me. Any reading less than 70 - come see me.  Otherwise - follow up with [place] in 2 months for you A1c and diabetes follow up   Hypoglycemia Hypoglycemia is when the sugar (glucose) level in the blood is too low. Symptoms of low blood sugar may include:  Feeling: ? Hungry. ? Worried or nervous (anxious). ? Sweaty and clammy. ? Confused. ? Dizzy. ? Sleepy. ? Sick to your stomach (nauseous).  Having: ? A fast heartbeat. ? A headache. ? A change in your vision. ? Jerky movements that you cannot control (seizure). ? Nightmares. ? Tingling or no feeling (numbness) around the mouth, lips, or tongue.  Having trouble with: ? Talking. ? Paying attention (concentrating). ? Moving (coordination). ? Sleeping.  Shaking.  Passing out (fainting).  Getting upset easily (irritability).  Low blood sugar can happen to people who have diabetes and people who do not have diabetes. Low blood sugar can happen quickly, and it can be an emergency. Treating Low Blood Sugar Low blood sugar is often treated by eating or drinking something sugary right away. If you can think clearly and swallow safely, follow the 15:15 rule:  Take 15 grams of a fast-acting carb (carbohydrate). Some fast-acting carbs are: ? 1 tube of glucose gel. ? 3 sugar tablets (glucose pills). ? 6-8 pieces of hard candy. ? 4 oz (120 mL) of fruit juice. ? 4 oz (120 mL) of regular (not diet) soda.  Check your blood sugar 15 minutes after you take the carb.  If your blood sugar is still at or below 70 mg/dL (3.9 mmol/L), take 15 grams of a carb again.  If your blood sugar does not go above 70 mg/dL (3.9 mmol/L) after 3 tries, get help right away.  After your  blood sugar goes back to normal, eat a meal or a snack within 1 hour.  Treating Very Low Blood Sugar If your blood sugar is at or below 54 mg/dL (3 mmol/L), you have very low blood sugar (severe hypoglycemia). This is an emergency. Do not wait to see if the symptoms will go away. Get medical help right away. Call your local emergency services (911 in the [place] Do not drive yourself to the hospital. If you have very low blood sugar and you cannot eat or drink, you may need a glucagon shot (injection). A family member or friend should learn how to check your blood sugar and how to give you a glucagon shot. Ask your doctor if you need to have a glucagon shot kit at home. Follow these instructions at home: General instructions  Avoid any diets that cause you to not eat enough food. Talk with your doctor before you start any new diet.  Take over-the-counter and prescription medicines only as told by your doctor.  Limit alcohol to no more than 1 drink per day for nonpregnant women and 2 drinks per day for men. One drink equals 12 oz of beer, 5 oz of wine, or 1 oz of hard liquor.  Keep all follow-up visits as told by your doctor. This is important. If You Have Diabetes:   Make sure you know the symptoms of low blood sugar.  Always  keep a source of sugar with you, such as: ? Sugar. ? Sugar tablets. ? Glucose gel. ? Fruit juice. ? Regular soda (not diet soda). ? Milk. ? Hard candy. ? Honey.  Take your medicines as told.  Follow your exercise and meal plan. ? Eat on time. Do not skip meals. ? Follow your sick day plan when you cannot eat or drink normally. Make this plan ahead of time with your doctor.  Check your blood sugar as often as told by your doctor. Always check before and after exercise.  Share your diabetes care plan with: ? Your work or school. ? People you live with.  Check your pee (urine) for ketones: ? When you are sick. ? As told by your doctor.  Carry a card or  wear jewelry that says you have diabetes. If You Have Low Blood Sugar From Other Causes:   Check your blood sugar as often as told by your doctor.  Follow instructions from your doctor about what you cannot eat or drink. Contact a doctor if:  You have trouble keeping your blood sugar in your target range.  You have low blood sugar often. Get help right away if:  You still have symptoms after you eat or drink something sugary.  Your blood sugar is at or below 54 mg/dL (3 mmol/L).  You have jerky movements that you cannot control.  You pass out. These symptoms may be an emergency. Do not wait to see if the symptoms will go away. Get medical help right away. Call your local emergency services (911 in the [place] Do not drive yourself to the hospital. This information is not intended to replace advice given to you by your health care provider. Make sure you discuss any questions you have with your health care provider. Document Released: 06/02/2009 Document Revised: 08/14/2015 Document Reviewed: 04/11/2015 Elsevier Interactive Patient Education  [employer].

## 2016-09-03 ENCOUNTER — Ambulatory Visit: Payer: Medicaid Other

## 2016-09-10 ENCOUNTER — Ambulatory Visit
Admission: RE | Admit: 2016-09-10 | Discharge: 2016-09-10 | Disposition: A | Discharge status: Home / Self Care | Payer: Medicaid Other | Source: Ambulatory Visit | Attending: Family Medicine | Admitting: Family Medicine

## 2016-09-10 DIAGNOSIS — Z1231 Encounter for screening mammogram for malignant neoplasm of breast: Secondary | ICD-10-CM

## 2016-09-17 ENCOUNTER — Other Ambulatory Visit: Payer: Self-pay | Admitting: Family Medicine

## 2016-09-17 DIAGNOSIS — IMO0001 Reserved for inherently not codable concepts without codable children: Secondary | ICD-10-CM

## 2016-09-17 DIAGNOSIS — E1165 Type 2 diabetes mellitus with hyperglycemia: Principal | ICD-10-CM

## 2016-09-23 ENCOUNTER — Other Ambulatory Visit: Payer: Self-pay | Admitting: Internal Medicine

## 2016-09-23 DIAGNOSIS — E1165 Type 2 diabetes mellitus with hyperglycemia: Principal | ICD-10-CM

## 2016-09-23 DIAGNOSIS — IMO0001 Reserved for inherently not codable concepts without codable children: Secondary | ICD-10-CM

## 2016-10-02 ENCOUNTER — Other Ambulatory Visit: Payer: Self-pay | Admitting: Internal Medicine

## 2016-10-05 ENCOUNTER — Other Ambulatory Visit: Payer: Self-pay

## 2016-10-05 NOTE — Telephone Encounter (Signed)
Dr. Berry pt. °

## 2016-10-19 ENCOUNTER — Other Ambulatory Visit: Payer: Self-pay | Admitting: Family Medicine

## 2016-10-19 DIAGNOSIS — Z9189 Other specified personal risk factors, not elsewhere classified: Secondary | ICD-10-CM

## 2016-10-19 DIAGNOSIS — E1165 Type 2 diabetes mellitus with hyperglycemia: Principal | ICD-10-CM

## 2016-10-19 DIAGNOSIS — IMO0001 Reserved for inherently not codable concepts without codable children: Secondary | ICD-10-CM

## 2016-10-27 ENCOUNTER — Ambulatory Visit: Payer: Medicaid Other | Admitting: Family Medicine

## 2016-10-29 ENCOUNTER — Other Ambulatory Visit: Payer: Self-pay | Admitting: Family Medicine

## 2016-10-29 DIAGNOSIS — E1165 Type 2 diabetes mellitus with hyperglycemia: Principal | ICD-10-CM

## 2016-10-29 DIAGNOSIS — IMO0001 Reserved for inherently not codable concepts without codable children: Secondary | ICD-10-CM

## 2016-11-12 ENCOUNTER — Ambulatory Visit: Payer: Medicaid Other | Admitting: Cardiovascular Disease

## 2016-11-15 ENCOUNTER — Other Ambulatory Visit: Payer: Self-pay | Admitting: Cardiovascular Disease

## 2016-11-15 MED ORDER — PROPRANOLOL HCL ER 120 MG PO CP24: 120.0000 mg | ORAL_CAPSULE | Freq: Every day | ORAL | 1 refills | Status: DC

## 2016-11-15 NOTE — Telephone Encounter (Signed)
Rx(s) sent to pharmacy electronically.  

## 2016-11-15 NOTE — Telephone Encounter (Signed)
°*  STAT* If patient is at the pharmacy, call can be transferred to refill team.   1. Which medications need to be refilled? (please list name of each medication and dose if known)Propranolol 80 mg-needs a new presciption  2. Which pharmacy/location (including street and city if local pharmacy) is medication to be sent to?[phone]  3. Do they need a 30 day or 90 day supply? 30 and refill

## 2016-11-18 ENCOUNTER — Ambulatory Visit: Payer: Medicaid Other | Attending: Family Medicine | Admitting: Family Medicine

## 2016-11-18 ENCOUNTER — Encounter: Payer: Self-pay | Admitting: Family Medicine

## 2016-11-18 VITALS — BP 116/78 | HR 75 | Temp 97.7°F | Resp 18 | Ht 71.0 in | Wt 210.6 lb

## 2016-11-18 DIAGNOSIS — Z794 Long term (current) use of insulin: Secondary | ICD-10-CM | POA: Insufficient documentation

## 2016-11-18 DIAGNOSIS — IMO0001 Reserved for inherently not codable concepts without codable children: Secondary | ICD-10-CM

## 2016-11-18 DIAGNOSIS — I10 Essential (primary) hypertension: Secondary | ICD-10-CM | POA: Diagnosis not present

## 2016-11-18 DIAGNOSIS — Z9189 Other specified personal risk factors, not elsewhere classified: Secondary | ICD-10-CM

## 2016-11-18 DIAGNOSIS — E1165 Type 2 diabetes mellitus with hyperglycemia: Secondary | ICD-10-CM

## 2016-11-18 LAB — GLUCOSE, POCT (MANUAL RESULT ENTRY): POC Glucose: 154 mg/dl — AB (ref 70–99)

## 2016-11-18 LAB — POCT GLYCOSYLATED HEMOGLOBIN (HGB A1C): Hemoglobin A1C: 7.4

## 2016-11-18 MED ORDER — PROPRANOLOL HCL ER 80 MG PO CP24
80.0000 mg | ORAL_CAPSULE | Freq: Every day | ORAL | 0 refills | Status: DC
Start: 2016-11-18 — End: 2016-11-29

## 2016-11-18 MED ORDER — METFORMIN HCL ER 500 MG PO TB24: 1000.0000 mg | ORAL_TABLET | Freq: Two times a day (BID) | ORAL | 0 refills | Status: DC

## 2016-11-18 MED ORDER — INSULIN GLARGINE 100 UNIT/ML ~~LOC~~ SOLN: SUBCUTANEOUS | 11 refills | Status: DC

## 2016-11-18 MED ORDER — ROSUVASTATIN CALCIUM 10 MG PO TABS: 10.0000 mg | ORAL_TABLET | Freq: Every day | ORAL | 0 refills | Status: DC

## 2016-11-18 NOTE — Progress Notes (Signed)
Patient is here for f/up  

## 2016-11-18 NOTE — Progress Notes (Signed)
Subjective:  Patient ID: Tonya Paul, female    DOB: [birth year]  Age: 41 y.o. MRN: [id number]  CC: Diabetes   HPI Tonya Paul presents for follow up of diabetes. Symptoms:none. Patient denies foot ulcerations, paresthesia of the feet and vomitting.  Evaluation to date has been included: hemoglobin A1C and microalbuminuria.  Reports not consistently checking her blood glucose. Home sugars: BG's 87-154.  Treatment to date: insulin and metformin.  History  of HTN. She does not check BP at  home. Cardiac symptoms none. Patient denies chest pain, chest pressure/discomfort, claudication, dyspnea, lower extremity edema, near-syncope, palpitations and syncope.  Cardiovascular risk factors: diabetes mellitus, hypertension and sedentary lifestyle. Use of agents associated with hypertension: none. History of target organ damage: none.  Outpatient Medications Prior to Visit  Medication Sig Dispense Refill  . B-D INS SYRINGE 0.5CC/31GX5/16 31G X 5/16" 0.5 ML MISC USE AS DIRECTED DAILY FOR INSULIN ADMINISTRATION 100 each 1  . Blood Glucose Monitoring Suppl (TRUE METRIX METER) DEVI 1 each by Does not apply route 3 (three) times daily before meals. 1 Device 0  . glucose blood (RELION GLUCOSE TEST STRIPS) test strip Use as instructed 100 each 12  . meclizine (ANTIVERT) 25 MG tablet Take 25 mg by mouth 3 (three) times daily as needed for dizziness or nausea.    Marland Kitchen omeprazole (PRILOSEC) 20 MG capsule Take 1 capsule (20 mg total) by mouth daily. (Patient taking differently: Take 20 mg by mouth as needed. ) 30 capsule 3  . LANTUS 100 UNIT/ML injection INJECT (22 UNITS TOTAL) INTO THE SKIN DAILY. 10 mL 2  . metFORMIN (GLUCOPHAGE-XR) 500 MG 24 hr tablet TAKE 2 TABLETS BY MOUTH TWICE A DAY 120 tablet 0  . propranolol ER (INDERAL LA) 120 MG 24 hr capsule Take 1 capsule (120 mg total) by mouth daily. 30 capsule 1  . rosuvastatin (CRESTOR) 10 MG tablet TAKE 1 TABLET BY MOUTH EVERY DAY 90 tablet 0   No  facility-administered medications prior to visit.     ROS Review of Systems   Constitutional: Negative.   Eyes: Negative. Respiratory: Negative.   Cardiovascular: Negative.   Gastrointestinal: Negative.   Skin: Negative.   Neurological: Negative.    Objective:  BP 116/78 (BP Location: Left Arm, Patient Position: Sitting, Cuff Size: Normal)   Pulse 75   Temp 97.7 F (36.5 C) (Oral)   Resp 18   Ht 5\' 11"  (1.803 m)   Wt 210 lb 9.6 oz (95.5 kg)   SpO2 98%   BMI 29.37 kg/m   BP/Weight 11/18/2016 08/18/2016 5/78/4696  Systolic BP 295 284 -  Diastolic BP 78 92 -  Wt. (Lbs) 210.6 215.4 -  BMI 29.37 30.04 29.71     Physical Exam   Constitutional: She is oriented to person, place, and time.  Eyes: Conjunctivae are normal. Pupils are equal, round, and reactive to light.  Neck: Normal range of motion. No JVD present.  Cardiovascular: Normal rate, regular rhythm, normal heart sounds and intact distal pulses.   Pulmonary/Chest: Effort normal and breath sounds normal.  Abdominal: Soft. Bowel sounds are normal.  Neurological: She is alert and oriented to person, place, and time.  Skin: Skin is warm and dry.  Psychiatric: She has a normal mood and affect.  Nursing note and vitals reviewed.  Assessment & Plan:   Problem List Items Addressed This Visit      Endocrine   Type 2 diabetes mellitus, uncontrolled ([place]) - Primary   Relevant Medications  metFORMIN (GLUCOPHAGE-XR) 500 MG 24 hr tablet   propranolol ER (INDERAL LA) 80 MG 24 hr capsule   insulin glargine (LANTUS) 100 UNIT/ML injection   rosuvastatin (CRESTOR) 10 MG tablet   Other Relevant Orders   HgB A1c (Completed)   Glucose (CBG) (Completed)    Other Visit Diagnoses    Essential hypertension       Relevant Medications   propranolol ER (INDERAL LA) 80 MG 24 hr capsule   rosuvastatin (CRESTOR) 10 MG tablet   Candidate for statin therapy due to risk of future cardiovascular event       Relevant Medications    rosuvastatin (CRESTOR) 10 MG tablet      Meds ordered this encounter  Medications  . metFORMIN (GLUCOPHAGE-XR) 500 MG 24 hr tablet    Sig: Take 2 tablets (1,000 mg total) by mouth 2 (two) times daily.    Dispense:  360 tablet    Refill:  0    Order Specific Question:   Supervising Provider    Answer:   Tresa Garter [id number]  . propranolol ER (INDERAL LA) 80 MG 24 hr capsule    Sig: Take 1 capsule (80 mg total) by mouth daily.    Dispense:  90 capsule    Refill:  0    Order Specific Question:   Supervising Provider    Answer:   Tresa Garter [id number]  . insulin glargine (LANTUS) 100 UNIT/ML injection    Sig: INJECT (24 UNITS TOTAL) INTO THE SKIN DAILY.    Dispense:  10 mL    Refill:  11    Order Specific Question:   Supervising Provider    Answer:   Tresa Garter [id number]  . rosuvastatin (CRESTOR) 10 MG tablet    Sig: Take 1 tablet (10 mg total) by mouth daily.    Dispense:  90 tablet    Refill:  0    Order Specific Question:   Supervising Provider    Answer:   Tresa Garter [id number]    Follow-up: Return in about 3 months (around 02/18/2017) for DM.   Alfonse Spruce FNP

## 2016-11-18 NOTE — Patient Instructions (Signed)
Diabetes Mellitus and Food It is important for you to manage your blood sugar (glucose) level. Your blood glucose level can be greatly affected by what you eat. Eating healthier foods in the appropriate amounts throughout the day at about the same time each day will help you control your blood glucose level. It can also help slow or prevent worsening of your diabetes mellitus. Healthy eating may even help you improve the level of your blood pressure and reach or maintain a healthy weight. General recommendations for healthful eating and cooking habits include:  Eating meals and snacks regularly. Avoid going long periods of time without eating to lose weight.  Eating a diet that consists mainly of plant-based foods, such as fruits, vegetables, nuts, legumes, and whole grains.  Using low-heat cooking methods, such as baking, instead of high-heat cooking methods, such as deep frying.  Work with your dietitian to make sure you understand how to use the Nutrition Facts information on food labels. How can food affect me? Carbohydrates Carbohydrates affect your blood glucose level more than any other type of food. Your dietitian will help you determine how many carbohydrates to eat at each meal and teach you how to count carbohydrates. Counting carbohydrates is important to keep your blood glucose at a healthy level, especially if you are using insulin or taking certain medicines for diabetes mellitus. Alcohol Alcohol can cause sudden decreases in blood glucose (hypoglycemia), especially if you use insulin or take certain medicines for diabetes mellitus. Hypoglycemia can be a life-threatening condition. Symptoms of hypoglycemia (sleepiness, dizziness, and disorientation) are similar to symptoms of having too much alcohol. If your health care provider has given you approval to drink alcohol, do so in moderation and use the following guidelines:  Women should not have more than one drink per day, and men  should not have more than two drinks per day. One drink is equal to: ? 12 oz of beer. ? 5 oz of wine. ? 1 oz of hard liquor.  Do not drink on an empty stomach.  Keep yourself hydrated. Have water, diet soda, or unsweetened iced tea.  Regular soda, juice, and other mixers might contain a lot of carbohydrates and should be counted.  What foods are not recommended? As you make food choices, it is important to remember that all foods are not the same. Some foods have fewer nutrients per serving than other foods, even though they might have the same number of calories or carbohydrates. It is difficult to get your body what it needs when you eat foods with fewer nutrients. Examples of foods that you should avoid that are high in calories and carbohydrates but low in nutrients include:  Trans fats (most processed foods list trans fats on the Nutrition Facts label).  Regular soda.  Juice.  Candy.  Sweets, such as cake, pie, doughnuts, and cookies.  Fried foods.  What foods can I eat? Eat nutrient-rich foods, which will nourish your body and keep you healthy. The food you should eat also will depend on several factors, including:  The calories you need.  The medicines you take.  Your weight.  Your blood glucose level.  Your blood pressure level.  Your cholesterol level.  You should eat a variety of foods, including:  Protein. ? Lean cuts of meat. ? Proteins low in saturated fats, such as fish, egg whites, and beans. Avoid processed meats.  Fruits and vegetables. ? Fruits and vegetables that may help control blood glucose levels, such as apples,   mangoes, and yams.  Dairy products. ? Choose fat-free or low-fat dairy products, such as milk, yogurt, and cheese.  Grains, bread, pasta, and rice. ? Choose whole grain products, such as multigrain bread, whole oats, and brown rice. These foods may help control blood pressure.  Fats. ? Foods containing healthful fats, such as  nuts, avocado, olive oil, canola oil, and fish.  Does everyone with diabetes mellitus have the same meal plan? Because every person with diabetes mellitus is different, there is not one meal plan that works for everyone. It is very important that you meet with a dietitian who will help you create a meal plan that is just right for you. This information is not intended to replace advice given to you by your health care provider. Make sure you discuss any questions you have with your health care provider. Document Released: 12/03/2004 Document Revised: 08/14/2015 Document Reviewed: 02/02/2013 Elsevier Interactive Patient Education  2017 Elsevier Inc.  

## 2016-11-29 ENCOUNTER — Telehealth: Payer: Self-pay | Admitting: Family Medicine

## 2016-11-29 ENCOUNTER — Other Ambulatory Visit: Payer: Self-pay | Admitting: Family Medicine

## 2016-11-29 DIAGNOSIS — E1165 Type 2 diabetes mellitus with hyperglycemia: Secondary | ICD-10-CM

## 2016-11-29 DIAGNOSIS — IMO0001 Reserved for inherently not codable concepts without codable children: Secondary | ICD-10-CM

## 2016-11-29 DIAGNOSIS — I10 Essential (primary) hypertension: Secondary | ICD-10-CM

## 2016-11-29 MED ORDER — "INSULIN SYRINGE-NEEDLE U-100 31G X 5/16"" 0.5 ML MISC": 1 refills | Status: DC

## 2016-11-29 MED ORDER — PROPRANOLOL HCL ER 80 MG PO CP24: 80.0000 mg | ORAL_CAPSULE | Freq: Every day | ORAL | 0 refills | Status: DC

## 2016-11-29 MED ORDER — METFORMIN HCL ER 500 MG PO TB24: 1000.0000 mg | ORAL_TABLET | Freq: Two times a day (BID) | ORAL | 0 refills | Status: DC

## 2016-11-29 NOTE — Telephone Encounter (Signed)
Requested medications refilled 

## 2016-11-29 NOTE — Telephone Encounter (Signed)
Pt. Called requesting a refill on the following medications:  propranolol ER (INDERAL LA) 80 MG 24 hr capsule  metFORMIN (GLUCOPHAGE-XR) 500 MG 24 hr tablet  Pt. Would also like refills on syringes.  Pt. Would like Rx sent to [facility] in [place], [place] Please f/u with pt.

## 2016-12-20 ENCOUNTER — Telehealth: Payer: Self-pay | Admitting: Cardiovascular Disease

## 2016-12-20 NOTE — Telephone Encounter (Signed)
Pt wants to know if she should go to the ER? She having more frequest Palpitations,slight discomfort in the middle of her chest,also lightheaded and dizzy.

## 2016-12-20 NOTE — Telephone Encounter (Signed)
Returned call to patient of Dr. Gwenlyn Found   She has frequent palpitations, lightheadedness/dizziness all week - almost passed out, chest discomfort. She is sitting and her heart is "palpitating". She is current on propranolol 80mg  daily. She states her HR is running in the 70s-80s. Advised an ED eval at [facility] of acute cardiac symptoms. Patient agreed w/plan and will go to ED. Contacted nurse first at [facility] and notified them of patient's pending arrival via private vehicle.

## 2016-12-20 NOTE — Telephone Encounter (Signed)
Error Pt hung up before I could get info

## 2016-12-22 ENCOUNTER — Ambulatory Visit: Payer: Medicaid Other | Admitting: Cardiovascular Disease

## 2016-12-23 ENCOUNTER — Emergency Department
Admission: EM | Admit: 2016-12-23 | Discharge: 2016-12-23 | Disposition: A | Discharge status: Home / Self Care | Payer: Medicaid Other | Attending: Emergency Medicine | Admitting: Emergency Medicine

## 2016-12-23 ENCOUNTER — Emergency Department: Payer: Medicaid Other

## 2016-12-23 ENCOUNTER — Encounter: Payer: Self-pay | Admitting: Emergency Medicine

## 2016-12-23 DIAGNOSIS — E119 Type 2 diabetes mellitus without complications: Secondary | ICD-10-CM | POA: Diagnosis not present

## 2016-12-23 DIAGNOSIS — I10 Essential (primary) hypertension: Secondary | ICD-10-CM | POA: Diagnosis not present

## 2016-12-23 DIAGNOSIS — R002 Palpitations: Secondary | ICD-10-CM | POA: Diagnosis not present

## 2016-12-23 DIAGNOSIS — R079 Chest pain, unspecified: Secondary | ICD-10-CM | POA: Diagnosis present

## 2016-12-23 LAB — CBC
HCT: 35.8 % (ref 35.0–47.0)
Hemoglobin: 11.9 g/dL — ABNORMAL LOW (ref 12.0–16.0)
MCH: 28.1 pg (ref 26.0–34.0)
MCHC: 33.3 g/dL (ref 32.0–36.0)
MCV: 84.5 fL (ref 80.0–100.0)
PLATELETS: 401 10*3/uL (ref 150–440)
RBC: 4.24 MIL/uL (ref 3.80–5.20)
RDW: 14.6 % — AB (ref 11.5–14.5)
WBC: 9.3 10*3/uL (ref 3.6–11.0)

## 2016-12-23 LAB — TROPONIN I: Troponin I: <0.03 ng/mL (ref <0.03)

## 2016-12-23 LAB — BASIC METABOLIC PANEL
Anion gap: 8 (ref 5–15)
BUN: 11 mg/dL (ref 6–20)
CALCIUM: 9.2 mg/dL (ref 8.9–10.3)
CHLORIDE: 107 mmol/L (ref 101–111)
CO2: 21 mmol/L — AB (ref 22–32)
CREATININE: 0.71 mg/dL (ref 0.44–1.00)
GFR calc Af Amer: >60 mL/min (ref >60)
GFR calc non Af Amer: >60 mL/min (ref >60)
Glucose, Bld: 168 mg/dL — ABNORMAL HIGH (ref 65–99)
Potassium: 3.7 mmol/L (ref 3.5–5.1)
SODIUM: 136 mmol/L (ref 135–145)

## 2016-12-23 MED ORDER — LORAZEPAM 0.5 MG PO TABS: 0.5000 mg | ORAL_TABLET | Freq: Three times a day (TID) | ORAL | 0 refills | Status: DC | PRN

## 2016-12-23 NOTE — ED Notes (Signed)
Patient transported to X-ray 

## 2016-12-23 NOTE — ED Provider Notes (Signed)
[facility] Emergency Department Provider Note       Time seen: ----------------------------------------- 4:10 PM on 12/23/2016 -----------------------------------------     I have reviewed the triage vital signs and the nursing notes.   HISTORY   Chief Complaint Chest Pain and Shortness of Breath    HPI Tonya Paul is a 41 y.o. female who presents to the ED for sudden onset of chest pain and shortness of breath. Patient reports some associated dizziness and lightheadedness. She reports she feels like her heart is having palpitations and fluttering. She noticed this more frequently than prior. She does report she works 18 hour days and only takes one day off a week, she is wondering if this may be stress related. She denies any recent illness. She has had this happen before for which she had her propranolol dose increased that she takes for long QT. The increased propranolol dose caused her to feel weak and she cannot tolerate that so she went back down 80 mg.  Past Medical History:  Diagnosis Date  . Diabetes mellitus without complication ([place])   . Hypertension   . Prolonged QT syndrome   . Vertigo     Patient Active Problem List   Diagnosis Date Noted  . Palpitations 02/03/2016  . Uterine fibroid 12/11/2015  . Family history of heart disease 12/03/2015  . Hemorrhoids 11/18/2015  . Left breast mass 09/01/2015  . Obesity 09/01/2015  . Type 2 diabetes mellitus, uncontrolled ([place]) 04/16/2015    Past Surgical History:  Procedure Laterality Date  . BREAST BIOPSY Left 09/03/2015   [place] Core bx, benign    Allergies Ace inhibitors; Aspirin; and Ciprofloxacin  Social History Social History  Substance Use Topics  . Smoking status: Never Smoker  . Smokeless tobacco: Never Used  . Alcohol use No    Review of Systems Constitutional: Negative for fever. Eyes: Negative for vision changes ENT:  Negative for congestion, sore  throat Cardiovascular: positive for chest pain, positive for palpitations Respiratory: Negative for shortness of breath. Gastrointestinal: Negative for abdominal pain, vomiting and diarrhea. Genitourinary: Negative for dysuria. Musculoskeletal: Negative for back pain. Skin: Negative for rash. Neurological: Negative for headaches, positive for weakness  All systems negative/normal/unremarkable except as stated in the HPI  ____________________________________________   PHYSICAL EXAM:  VITAL SIGNS: ED Triage Vitals [12/23/16 1311]  Enc Vitals Group     BP 122/80     Pulse Rate 82     Resp 16     Temp 98.4 F (36.9 C)     Temp Source Oral     SpO2 99 %     Weight 203 lb (92.1 kg)     Height 5\' 10"  (1.778 m)     Head Circumference      Peak Flow      Pain Score 2     Pain Loc      Pain Edu?      Excl. in [place]?    Constitutional: Alert and oriented. Well appearing and in no distress. Eyes: Conjunctivae are normal. Normal extraocular movements. ENT   Head: Normocephalic and atraumatic.   Nose: No congestion/rhinnorhea.   Mouth/Throat: Mucous membranes are moist.   Neck: No stridor. Cardiovascular: Normal rate, regular rhythm. No murmurs, rubs, or gallops. Respiratory: Normal respiratory effort without tachypnea nor retractions. Breath sounds are clear and equal bilaterally. No wheezes/rales/rhonchi. Gastrointestinal: Soft and nontender. Normal bowel sounds Musculoskeletal: Nontender with normal range of motion in extremities. No lower extremity tenderness nor edema. Neurologic:  Normal speech and language. No gross focal neurologic deficits are appreciated.  Skin:  Skin is warm, dry and intact. No rash noted. Psychiatric: Mood and affect are normal. Speech and behavior are normal.  ____________________________________________  EKG: Interpreted by me.sinus rhythm rate 80 bpm, normal PR interval, normal QRS, normal Q-T. Normal  axis.  ____________________________________________  ED COURSE:  Pertinent labs & imaging results that were available during my care of the patient were reviewed by me and considered in my medical decision making (see chart for details). Patient presents for palpitations and possibly chest pain, we will assess with labs and imaging as indicated.   Procedures ____________________________________________   LABS (pertinent positives/negatives)  Labs Reviewed  BASIC METABOLIC PANEL - Abnormal; Notable for the following:       Result Value   CO2 21 (*)    Glucose, Bld 168 (*)    All other components within normal limits  CBC - Abnormal; Notable for the following:    Hemoglobin 11.9 (*)    RDW 14.6 (*)    All other components within normal limits  TROPONIN I    RADIOLOGY  chest x-ray is normal  ____________________________________________  DIFFERENTIAL DIAGNOSIS   arrhythmia, angina, PE, anxiety, GERD, muscle strain   FINAL ASSESSMENT AND PLAN  palpitations   Plan: Patient's labs and imaging were dictated above. Patient had presented for palpitations for which I cannot find a specific cause. These are possibly stress related. She reports an episode while sitting in the room talking to her with her being monitored. During this episode there was no tachycardia or arrhythmia present. I will try short supply of Ativan to take as needed. Advised that she needs to take some time off work and to rest more. At this does not improve her symptoms she needs to see her cardiologist in the next week.   Earleen Newport, MD   Note: This note was generated in part or whole with voice recognition software. Voice recognition is usually quite accurate but there are transcription errors that can and very often do occur. I apologize for any typographical errors that were not detected and corrected.     Earleen Newport, MD 12/23/16 [id number]

## 2016-12-23 NOTE — ED Triage Notes (Signed)
Pt in via [facility] from work with complaints of sudden onset chest pain and shortness of breath, pt reports some associated diziness, light headedness w/ pain.  Pt reports hx of heart palpatations, reports noticing them more frequently.  Vitals WDL, NAD noted at this time.

## 2016-12-26 ENCOUNTER — Other Ambulatory Visit: Payer: Self-pay | Admitting: Family Medicine

## 2016-12-26 DIAGNOSIS — IMO0001 Reserved for inherently not codable concepts without codable children: Secondary | ICD-10-CM

## 2016-12-26 DIAGNOSIS — E1165 Type 2 diabetes mellitus with hyperglycemia: Principal | ICD-10-CM

## 2017-01-14 ENCOUNTER — Ambulatory Visit (INDEPENDENT_AMBULATORY_CARE_PROVIDER_SITE_OTHER): Payer: Medicaid Other | Admitting: Cardiovascular Disease

## 2017-01-14 ENCOUNTER — Encounter: Payer: Self-pay | Admitting: Cardiovascular Disease

## 2017-01-14 DIAGNOSIS — R002 Palpitations: Secondary | ICD-10-CM

## 2017-01-14 DIAGNOSIS — I4581 Long QT syndrome: Secondary | ICD-10-CM

## 2017-01-14 HISTORY — DX: Long QT syndrome: I45.81

## 2017-01-14 NOTE — Patient Instructions (Signed)
Medication Instructions: Your physician recommends that you continue on your current medications as directed. Please refer to the Current Medication list given to you today.  Follow-Up: Your physician recommends that you schedule a follow-up appointment in: 1 month with PharmD for BP Check. Your physician has requested that you regularly monitor and record your blood pressure readings at home. Please use the same machine at the same time of day to check your readings and record them to bring to your follow-up visit.  We request that you follow-up in: 6 months with Almyra Deforest, PA and in 12 months with Dr Andria Rhein will receive a reminder letter in the mail two months in advance. If you don't receive a letter, please call our office to schedule the follow-up appointment.  If you need a refill on your cardiac medications before your next appointment, please call your pharmacy.

## 2017-01-14 NOTE — Assessment & Plan Note (Signed)
Tonya Paul was evaluated by Dr. Caryl Comes for long QT syndrome because of family history of sudden death. A routine GXT was normal except for prolongation of her QT with exercise. Dr. Caryl Comes felt that this was predictive of long QT syndrome with 70% predictive accuracy. Her son was genetically tested and did not have the gene. The treatment and event would be beta blockade which she is already on.

## 2017-01-14 NOTE — Progress Notes (Signed)
01/14/2017 [name]   24-Oct-1975  [id number]  Primary Physician Tonya Spruce, FNP Primary Cardiologist: Lorretta Harp MD Tonya Paul, [place]  HPI:  Tonya Paul is a 41 y.o. female moderately overweight married African-American female mother of one 32 year-old son , Tonya Paul, who is self-referred for evaluation of inheritable causes of sudden cardiac death. I last saw her in the office 02/03/16. Unfortunately, her 60 year old daughter died suddenly 11-15-2012 of unclear reasons. Her fourth cousin did as well. Her only risk factor for heart disease as type 2 diabetes. She's never had a heart attack and stroke. She does get occasional shortness of breath but denies chest pain. She's had palpitations that occur daily over the last several months. She does not drink caffeine or alcohol. A 2-D echo was performed that showed normal LV function with mild to moderate TR. I referred her to Dr. Caryl Comes to evaluate her for inherited causes of sudden death such as "Channelopathies ". A routine GXT was performed by Dr. Caryl Comes and this was entirely normal other than exercise-induced prolongation of her QT interval somewhat suggestive of long QT syndrome. Her son tires was gently tested at [facility] and didn't not carry the gene. She does complain of occasional atypical chest pain which sounds musculoskeletal.   Current Meds  Medication Sig  . Blood Glucose Monitoring Suppl (TRUE METRIX METER) DEVI 1 each by Does not apply route 3 (three) times daily before meals.  Marland Kitchen glucose blood (RELION GLUCOSE TEST STRIPS) test strip Use as instructed  . insulin glargine (LANTUS) 100 UNIT/ML injection INJECT (24 UNITS TOTAL) INTO THE SKIN DAILY.  Marland Kitchen Insulin Syringe-Needle U-100 (B-D INS SYRINGE 0.5CC/31GX5/16) 31G X 5/16" 0.5 ML MISC USE AS DIRECTED DAILY FOR INSULIN ADMINISTRATION  . LORazepam (ATIVAN) 0.5 MG tablet Take 1 tablet (0.5 mg total) by mouth every 8 (eight) hours as needed for anxiety.  .  meclizine (ANTIVERT) 25 MG tablet Take 25 mg by mouth 3 (three) times daily as needed for dizziness or nausea.  . metFORMIN (GLUCOPHAGE-XR) 500 MG 24 hr tablet Take 2 tablets (1,000 mg total) by mouth 2 (two) times daily.  Marland Kitchen omeprazole (PRILOSEC) 20 MG capsule Take 1 capsule (20 mg total) by mouth daily. (Patient taking differently: Take 20 mg by mouth as needed. )  . propranolol ER (INDERAL LA) 80 MG 24 hr capsule Take 1 capsule (80 mg total) by mouth daily.  . rosuvastatin (CRESTOR) 10 MG tablet Take 1 tablet (10 mg total) by mouth daily.     Allergies  Allergen Reactions  . Ace Inhibitors Shortness Of Breath    Reported by the patient after a trial of lisinopril prescribes by PCP  . Aspirin Anaphylaxis  . Ciprofloxacin Itching    Social History   Social History  . Marital status: Married    Spouse name: N/A  . Number of children: N/A  . Years of education: N/A   Occupational History  . Not on file.   Social History Main Topics  . Smoking status: Never Smoker  . Smokeless tobacco: Never Used  . Alcohol use No  . Drug use: No  . Sexual activity: Yes   Other Topics Concern  . Not on file   Social History Narrative  . No narrative on file     Review of Systems: General: negative for chills, fever, night sweats or weight changes.  Cardiovascular: negative for chest pain, dyspnea on exertion, edema, orthopnea, palpitations, paroxysmal nocturnal dyspnea or  shortness of breath Dermatological: negative for rash Respiratory: negative for cough or wheezing Urologic: negative for hematuria Abdominal: negative for nausea, vomiting, diarrhea, bright red blood per rectum, melena, or hematemesis Neurologic: negative for visual changes, syncope, or dizziness All other systems reviewed and are otherwise negative except as noted above.    Blood pressure (!) 140/93, pulse 84, height 5\' 10"  (1.778 m), weight 210 lb (95.3 kg).  General appearance: alert and no distress Neck: no  adenopathy, no carotid bruit, no JVD, supple, symmetrical, trachea midline and thyroid not enlarged, symmetric, no tenderness/mass/nodules Lungs: clear to auscultation bilaterally Heart: regular rate and rhythm, S1, S2 normal, no murmur, click, rub or gallop Extremities: extremities normal, atraumatic, no cyanosis or edema Pulses: 2+ and symmetric Skin: Skin color, texture, turgor normal. No rashes or lesions Neurologic: Alert and oriented X 3, normal strength and tone. Normal symmetric reflexes. Normal coordination and gait  EKG not performed today.  ASSESSMENT AND PLAN:   Palpitations [name] returns today for evaluation of palpitations. Event monitor performed 12/08/15 showed sinus rhythm with occasional PVCs. She was on propranolol 80 mg a day which was increased by Almyra Deforest  PAC to 120 mg a day however this was not tolerated by her.  Long Q-T syndrome [name] was evaluated by Dr. Caryl Comes for long QT syndrome because of family history of sudden death. A routine GXT was normal except for prolongation of her QT with exercise. Dr. Caryl Comes felt that this was predictive of long QT syndrome with 70% predictive accuracy. Her son was genetically tested and did not have the gene. The treatment and event would be beta blockade which she is already on.      Lorretta Harp MD [facility], [facility] 01/14/2017 10:35 AM

## 2017-01-14 NOTE — Assessment & Plan Note (Signed)
Ms. Gingerich returns today for evaluation of palpitations. Event monitor performed 12/08/15 showed sinus rhythm with occasional PVCs. She was on propranolol 80 mg a day which was increased by Almyra Deforest  PAC to 120 mg a day however this was not tolerated by her.

## 2017-02-01 ENCOUNTER — Encounter: Payer: Self-pay | Admitting: Emergency Medicine

## 2017-02-01 ENCOUNTER — Emergency Department
Admission: EM | Admit: 2017-02-01 | Discharge: 2017-02-01 | Disposition: A | Discharge status: Home / Self Care | Payer: Medicaid Other | Attending: Emergency Medicine | Admitting: Emergency Medicine

## 2017-02-01 ENCOUNTER — Other Ambulatory Visit: Payer: Self-pay

## 2017-02-01 DIAGNOSIS — E119 Type 2 diabetes mellitus without complications: Secondary | ICD-10-CM | POA: Insufficient documentation

## 2017-02-01 DIAGNOSIS — I10 Essential (primary) hypertension: Secondary | ICD-10-CM | POA: Insufficient documentation

## 2017-02-01 DIAGNOSIS — N12 Tubulo-interstitial nephritis, not specified as acute or chronic: Secondary | ICD-10-CM | POA: Insufficient documentation

## 2017-02-01 DIAGNOSIS — R1030 Lower abdominal pain, unspecified: Secondary | ICD-10-CM | POA: Diagnosis present

## 2017-02-01 LAB — URINALYSIS, COMPLETE (UACMP) WITH MICROSCOPIC
BILIRUBIN URINE: NEGATIVE
GLUCOSE, UA: NEGATIVE mg/dL
Ketones, ur: NEGATIVE mg/dL
NITRITE: POSITIVE — AB
PROTEIN: 30 mg/dL — AB
Specific Gravity, Urine: 1.018 (ref 1.005–1.030)
pH: 5 (ref 5.0–8.0)

## 2017-02-01 LAB — POCT PREGNANCY, URINE: Preg Test, Ur: NEGATIVE

## 2017-02-01 LAB — CBC
HEMATOCRIT: 37.7 % (ref 35.0–47.0)
Hemoglobin: 12.4 g/dL (ref 12.0–16.0)
MCH: 27.9 pg (ref 26.0–34.0)
MCHC: 32.9 g/dL (ref 32.0–36.0)
MCV: 84.6 fL (ref 80.0–100.0)
PLATELETS: 433 10*3/uL (ref 150–440)
RBC: 4.45 MIL/uL (ref 3.80–5.20)
RDW: 14.4 % (ref 11.5–14.5)
WBC: 8.3 10*3/uL (ref 3.6–11.0)

## 2017-02-01 LAB — BASIC METABOLIC PANEL
Anion gap: 10 (ref 5–15)
BUN: 10 mg/dL (ref 6–20)
CHLORIDE: 105 mmol/L (ref 101–111)
CO2: 21 mmol/L — ABNORMAL LOW (ref 22–32)
CREATININE: 0.82 mg/dL (ref 0.44–1.00)
Calcium: 9.1 mg/dL (ref 8.9–10.3)
GFR calc Af Amer: >60 mL/min (ref >60)
GLUCOSE: 245 mg/dL — AB (ref 65–99)
POTASSIUM: 3.3 mmol/L — AB (ref 3.5–5.1)
SODIUM: 136 mmol/L (ref 135–145)

## 2017-02-01 MED ORDER — PROMETHAZINE HCL 12.5 MG PO TABS: 12.5000 mg | ORAL_TABLET | Freq: Four times a day (QID) | ORAL | 0 refills | Status: DC | PRN

## 2017-02-01 MED ORDER — SULFAMETHOXAZOLE-TRIMETHOPRIM 800-160 MG PO TABS
1.0000 | ORAL_TABLET | Freq: Once | ORAL | Status: AC
  Administered 2017-02-01: 1 via ORAL
  Filled 2017-02-01: qty 1

## 2017-02-01 MED ORDER — SULFAMETHOXAZOLE-TRIMETHOPRIM 800-160 MG PO TABS: 1.0000 | ORAL_TABLET | Freq: Two times a day (BID) | ORAL | 0 refills | Status: DC

## 2017-02-01 MED ORDER — KETOROLAC TROMETHAMINE 10 MG PO TABS: 10.0000 mg | ORAL_TABLET | Freq: Three times a day (TID) | ORAL | 0 refills | Status: DC | PRN

## 2017-02-01 MED ORDER — OXYCODONE-ACETAMINOPHEN 5-325 MG PO TABS
1.0000 | ORAL_TABLET | Freq: Once | ORAL | Status: AC
  Administered 2017-02-01: 1 via ORAL
  Filled 2017-02-01: qty 1

## 2017-02-01 MED ORDER — OXYCODONE-ACETAMINOPHEN 5-325 MG PO TABS: 1.0000 | ORAL_TABLET | ORAL | 0 refills | Status: DC | PRN

## 2017-02-01 MED ORDER — PROMETHAZINE HCL 25 MG PO TABS
25.0000 mg | ORAL_TABLET | Freq: Once | ORAL | Status: AC
  Administered 2017-02-01: 25 mg via ORAL
  Filled 2017-02-01: qty 1

## 2017-02-01 MED ORDER — KETOROLAC TROMETHAMINE 10 MG PO TABS: 10.0000 mg | ORAL_TABLET | Freq: Once | ORAL | Status: DC

## 2017-02-01 MED ORDER — POTASSIUM CHLORIDE CRYS ER 20 MEQ PO TBCR
40.0000 meq | EXTENDED_RELEASE_TABLET | Freq: Once | ORAL | Status: AC
  Administered 2017-02-01: 40 meq via ORAL
  Filled 2017-02-01: qty 2

## 2017-02-01 NOTE — Discharge Instructions (Addendum)
Please drink plenty of fluids stay well-hydrated.  You may take Tylenol for mild to moderate pain.  Percocet is for severe pain.  Do not drive within 8 hours of taking Percocet.  Please take the entire course of antibiotics, even if you are feeling better.  Drink plenty of fluid.  Return to the emergency department if you develop fever, inability to keep down fluids, severe pain, or any other symptoms concerning to you.

## 2017-02-01 NOTE — ED Triage Notes (Signed)
Pt presents to ED via POV c/o bil flank pain starting 1 week ago. Denies dysuria. States hx of bil kidney infections. Temp last night 102, took tylenol. +mild nausea. Denies abd pain, v/d.

## 2017-02-01 NOTE — ED Provider Notes (Addendum)
[facility] Emergency Department Provider Note  ____________________________________________  Time seen: Approximately 7:30 PM  I have reviewed the triage vital signs and the nursing notes.   HISTORY  Chief Complaint Flank Pain and Nausea    HPI Tonya Paul is a 41 y.o. female with a history of recurrent UTI, DM, HTN, prolonged QT syndrome presenting with bilateral flank pain and urinary frequency.  The patient reports that for at least 3 weeks, she has had urinary frequency with bilateral flank pain, nausea without vomiting.  No diarrhea, change in vaginal discharge, dysuria or hematuria.  Last night, the patient had a fever to 102.1 which she treated with an antipyretic.  Past Medical History:  Diagnosis Date  . Diabetes mellitus without complication ([place])   . Hypertension   . Prolonged QT syndrome   . Vertigo     Patient Active Problem List   Diagnosis Date Noted  . Long Q-T syndrome 01/14/2017  . Palpitations 02/03/2016  . Uterine fibroid 12/11/2015  . Family history of heart disease 12/03/2015  . Hemorrhoids 11/18/2015  . Left breast mass 09/01/2015  . Obesity 09/01/2015  . Type 2 diabetes mellitus, uncontrolled ([place]) 04/16/2015    Past Surgical History:  Procedure Laterality Date  . BREAST BIOPSY Left 09/03/2015   [place] Core bx, benign    Current Outpatient Rx  . Order #: [id number] Class: Normal  . Order #: [id number] Class: Normal  . Order #: [id number] Class: Normal  . Order #: [id number] Class: Normal  . Order #: [id number] Class: Print  . Order #: [id number] Class: Historical Med  . Order #: [id number] Class: Normal  . Order #: [id number] Class: Normal  . Order #: [id number] Class: Print  . Order #: [id number] Class: Normal  . Order #: [id number] Class: Normal  . Order #: [id number] Class: Print    Allergies Ace inhibitors; Aspirin; and Ciprofloxacin  Family History  Problem Relation Age of Onset  . Hypertension Mother   . Lung cancer Mother    . Throat cancer Father   . Diabetes Sister   . Lupus Maternal Grandmother   . Stroke Maternal Grandfather   . Sudden Cardiac Death Daughter   . Breast cancer Paternal Aunt     Social History Social History   Tobacco Use  . Smoking status: Never Smoker  . Smokeless tobacco: Never Used  Substance Use Topics  . Alcohol use: No  . Drug use: No    Review of Systems Constitutional: Positive fever and generalized malaise. Eyes: No visual changes. ENT: No sore throat. No congestion or rhinorrhea. Cardiovascular: Denies chest pain. Denies palpitations. Respiratory: Denies shortness of breath.  No cough. Gastrointestinal: Positive bilateral flank pain without abdominal pain.  No nausea, no vomiting.  No diarrhea.  No constipation. Genitourinary: Negative for dysuria.  Positive for urinary frequency.  No change in vaginal discharge. Musculoskeletal: Negative for back pain except for flank pain.. Skin: Negative for rash. Neurological: Negative for headaches. No focal numbness, tingling or weakness.     ____________________________________________   PHYSICAL EXAM:  VITAL SIGNS: ED Triage Vitals  Enc Vitals Group     BP 02/01/17 1635 122/81     Pulse Rate 02/01/17 1635 89     Resp 02/01/17 1635 18     Temp 02/01/17 1635 98.1 F (36.7 C)     Temp Source 02/01/17 1635 Oral     SpO2 02/01/17 1635 99 %     Weight 02/01/17 1636 205 lb (93 kg)     Height 02/01/17 1636 5\' 10"  (1.778 m)  Head Circumference --      Peak Flow --      Pain Score 02/01/17 1634 7     Pain Loc --      Pain Edu? --      Excl. in [place]? --     Constitutional: Alert and oriented.  Mildly uncomfortable appearing and in no acute distress. Answers questions appropriately. Eyes: Conjunctivae are normal.  EOMI. No scleral icterus. Head: Atraumatic. Nose: No congestion/rhinnorhea. Mouth/Throat: Mucous membranes are moist.  Neck: No stridor.  Supple.  No JVD. Cardiovascular: Normal rate, regular rhythm.  No murmurs, rubs or gallops.  Respiratory: Normal respiratory effort.  No accessory muscle use or retractions. Lungs CTAB.  No wheezes, rales or ronchi. Gastrointestinal: Positive bilateral CVA tenderness to palpation.  Soft, nontender and nondistended.  No guarding or rebound.  No peritoneal signs. Musculoskeletal: No LE edema. No ttp in the calves or palpable cords.  Negative Homan's sign. Neurologic:  A&Ox3.  Speech is clear.  Face and smile are symmetric.  EOMI.  Moves all extremities well. Skin:  Skin is warm, dry and intact. No rash noted. Psychiatric: Mood and affect are normal. Speech and behavior are normal.  Normal judgement.  ____________________________________________   LABS (all labs ordered are listed, but only abnormal results are displayed)  Labs Reviewed  URINALYSIS, COMPLETE (UACMP) WITH MICROSCOPIC - Abnormal; Notable for the following components:      Result Value   Color, Urine YELLOW (*)    APPearance CLOUDY (*)    Hgb urine dipstick SMALL (*)    Protein, ur 30 (*)    Nitrite POSITIVE (*)    Leukocytes, UA LARGE (*)    Bacteria, UA MANY (*)    Squamous Epithelial / LPF TOO NUMEROUS TO COUNT (*)    All other components within normal limits  BASIC METABOLIC PANEL - Abnormal; Notable for the following components:   Potassium 3.3 (*)    CO2 21 (*)    Glucose, Bld 245 (*)    All other components within normal limits  CBC  POC URINE PREG, ED  POCT PREGNANCY, URINE   ____________________________________________  EKG  ED ECG REPORT I, Eula Listen, the attending physician, personally viewed and interpreted this ECG.   Date: 02/01/2017  EKG Time: 1950  Rate: 79  Rhythm: normal sinus rhythm  Axis: normal  Intervals:none  ST&T Change: Nonspecific T wave inversions in V1.  No STEMI.  ____________________________________________  RADIOLOGY  No results found.  ____________________________________________   PROCEDURES  Procedure(s)  performed: None  Procedures  Critical Care performed: No ____________________________________________   INITIAL IMPRESSION / ASSESSMENT AND PLAN / ED COURSE  Pertinent labs & imaging results that were available during my care of the patient were reviewed by me and considered in my medical decision making (see chart for details).  40 y.o. female with a history of recurrent UTI presenting with urinary frequency and bilateral flank pain, and fever.  Overall, the patient's symptoms and laboratory studies are most consistent with pyelonephritis.  Bilateral renal colic causing UTI would be very unlikely.  Patient has no vaginal complaints.  The patient has no evidence of sepsis, or indication for admission.  I will plan to treat her with an anti-emetics here, and initiate antibiotics in the emergency department.  She will be discharged home with a 14-day course of Bactrim, Phenergan for her nausea.  The patient has a history of prolonged QT interval, last 2 EKGs had QTC of 461.  Will repeat her EKG  today.  ____________________________________________  FINAL CLINICAL IMPRESSION(S) / ED DIAGNOSES  Final diagnoses:  Pyelonephritis         NEW MEDICATIONS STARTED DURING THIS VISIT:  This SmartLink is deprecated. Use AVSMEDLIST instead to display the medication list for a patient. Marland KitchenCeline Ahr, MD 02/01/17 1945    Eula Listen, MD 02/01/17 2109

## 2017-02-14 NOTE — Progress Notes (Deleted)
02/14/2017 [name] 1975/10/23 [id number]   HPI:  Tonya Paul is a 41 y.o. female patient of Dr Gwenlyn Found, with a PMH below who presents today for hypertension clinic evaluation.  Patient has a prolonged QT syndrome with a family history of sudden cardiac death, as well as some complaints of occasional palpitations.  She is currently on propranolol ER 80 mg daily, not having tolerating the 120 mg dose.  In addition to these cardiac issues she has DM2, on insulin, and hyperlipidemia.    Blood Pressure Goal:  130/80  Current Medications:  Propranolol er 80 mg qd  Family Hx:  Sudden cardiac death in her daughter (age 20) and one cousin  Social Hx:  Diet:  Exercise:  Home BP readings:  Intolerances:   Estimated Creatinine Clearance: 111.6 mL/min (by C-G formula based on SCr of 0.82 mg/dL).  Wt Readings from Last 3 Encounters:  02/01/17 205 lb (93 kg)  01/14/17 210 lb (95.3 kg)  12/23/16 203 lb (92.1 kg)   BP Readings from Last 3 Encounters:  02/01/17 133/78  01/14/17 (!) 140/93  12/23/16 124/79   Pulse Readings from Last 3 Encounters:  02/01/17 74  01/14/17 84  12/23/16 76    Current Outpatient Medications  Medication Sig Dispense Refill  . Blood Glucose Monitoring Suppl (TRUE METRIX METER) DEVI 1 each by Does not apply route 3 (three) times daily before meals. 1 Device 0  . glucose blood (RELION GLUCOSE TEST STRIPS) test strip Use as instructed 100 each 12  . insulin glargine (LANTUS) 100 UNIT/ML injection INJECT (24 UNITS TOTAL) INTO THE SKIN DAILY. 10 mL 11  . Insulin Syringe-Needle U-100 (B-D INS SYRINGE 0.5CC/31GX5/16) 31G X 5/16" 0.5 ML MISC USE AS DIRECTED DAILY FOR INSULIN ADMINISTRATION 100 each 1  . LORazepam (ATIVAN) 0.5 MG tablet Take 1 tablet (0.5 mg total) by mouth every 8 (eight) hours as needed for anxiety. 30 tablet 0  . meclizine (ANTIVERT) 25 MG tablet Take 25 mg by mouth 3 (three) times daily as needed for dizziness or nausea.    .  metFORMIN (GLUCOPHAGE-XR) 500 MG 24 hr tablet Take 2 tablets (1,000 mg total) by mouth 2 (two) times daily. 360 tablet 0  . omeprazole (PRILOSEC) 20 MG capsule Take 1 capsule (20 mg total) by mouth daily. (Patient taking differently: Take 20 mg by mouth as needed. ) 30 capsule 3  . oxyCODONE-acetaminophen (ROXICET) 5-325 MG tablet Take 1 tablet every 4 (four) hours as needed by mouth. 12 tablet 0  . promethazine (PHENERGAN) 12.5 MG tablet Take 1-2 tablets (12.5-25 mg total) every 6 (six) hours as needed by mouth for nausea or vomiting. 20 tablet 0  . propranolol ER (INDERAL LA) 80 MG 24 hr capsule Take 1 capsule (80 mg total) by mouth daily. 90 capsule 0  . rosuvastatin (CRESTOR) 10 MG tablet Take 1 tablet (10 mg total) by mouth daily. 90 tablet 0  . sulfamethoxazole-trimethoprim (BACTRIM DS,SEPTRA DS) 800-160 MG tablet Take 1 tablet 2 (two) times daily by mouth. 28 tablet 0   No current facility-administered medications for this visit.     Allergies  Allergen Reactions  . Ace Inhibitors Shortness Of Breath    Reported by the patient after a trial of lisinopril prescribes by PCP  . Aspirin Anaphylaxis  . Ciprofloxacin Itching    Past Medical History:  Diagnosis Date  . Diabetes mellitus without complication ([place])   . Hypertension   . Prolonged QT syndrome   . Vertigo  Last menstrual period 01/19/2017.  No problem-specific Assessment & Plan notes found for this encounter.   Tommy Medal PharmD CPP [facility] [address] [place], [place] [address] [phone]

## 2017-02-15 ENCOUNTER — Ambulatory Visit: Payer: Medicaid Other

## 2017-02-18 ENCOUNTER — Ambulatory Visit: Payer: Self-pay | Admitting: Family Medicine

## 2017-03-03 ENCOUNTER — Telehealth: Payer: Self-pay | Admitting: Family Medicine

## 2017-03-03 NOTE — Telephone Encounter (Signed)
Pt. Called requesting a refill on propranolol ER (INDERAL LA) 80 MG 24 hr capsule Pt. States that she would like a 90 day supply and would like Rx sent to [facility] in [place], [place]

## 2017-03-04 ENCOUNTER — Other Ambulatory Visit: Payer: Self-pay | Admitting: Family Medicine

## 2017-03-04 DIAGNOSIS — I10 Essential (primary) hypertension: Secondary | ICD-10-CM

## 2017-03-04 MED ORDER — PROPRANOLOL HCL ER 80 MG PO CP24: 80.0000 mg | ORAL_CAPSULE | Freq: Every day | ORAL | 0 refills | Status: DC

## 2017-03-04 NOTE — Telephone Encounter (Signed)
Patient last seen in office on 10/2016. Will refill for 30 day supply only. Recommend she schedule an office visit.

## 2017-03-04 NOTE — Telephone Encounter (Signed)
CMA call regarding medication refill  Patient was aware and understood   

## 2017-03-31 ENCOUNTER — Ambulatory Visit: Payer: Medicaid Other | Admitting: Family Medicine

## 2017-04-05 ENCOUNTER — Ambulatory Visit: Payer: Medicaid Other | Attending: Family Medicine | Admitting: Family Medicine

## 2017-04-05 ENCOUNTER — Encounter: Payer: Self-pay | Admitting: Family Medicine

## 2017-04-05 VITALS — BP 125/77 | HR 76 | Temp 98.7°F | Resp 18 | Ht 71.0 in | Wt 213.0 lb

## 2017-04-05 DIAGNOSIS — E1165 Type 2 diabetes mellitus with hyperglycemia: Secondary | ICD-10-CM

## 2017-04-05 DIAGNOSIS — Z79899 Other long term (current) drug therapy: Secondary | ICD-10-CM | POA: Diagnosis not present

## 2017-04-05 DIAGNOSIS — I10 Essential (primary) hypertension: Secondary | ICD-10-CM

## 2017-04-05 DIAGNOSIS — F419 Anxiety disorder, unspecified: Secondary | ICD-10-CM

## 2017-04-05 DIAGNOSIS — Z794 Long term (current) use of insulin: Secondary | ICD-10-CM | POA: Insufficient documentation

## 2017-04-05 LAB — GLUCOSE, POCT (MANUAL RESULT ENTRY): POC GLUCOSE: 97 mg/dL (ref 70–99)

## 2017-04-05 LAB — POCT GLYCOSYLATED HEMOGLOBIN (HGB A1C): HEMOGLOBIN A1C: 8.3

## 2017-04-05 MED ORDER — INSULIN GLARGINE 100 UNIT/ML ~~LOC~~ SOLN: SUBCUTANEOUS | 11 refills | Status: DC

## 2017-04-05 MED ORDER — HYDROXYZINE HCL 25 MG PO TABS: 25.0000 mg | ORAL_TABLET | Freq: Three times a day (TID) | ORAL | 1 refills | Status: DC | PRN

## 2017-04-05 MED ORDER — PROPRANOLOL HCL ER 80 MG PO CP24: 80.0000 mg | ORAL_CAPSULE | Freq: Every day | ORAL | 2 refills | Status: DC

## 2017-04-05 MED ORDER — METFORMIN HCL ER 500 MG PO TB24: 1000.0000 mg | ORAL_TABLET | Freq: Two times a day (BID) | ORAL | 1 refills | Status: DC

## 2017-04-05 MED ORDER — GLIPIZIDE 5 MG PO TABS: 5.0000 mg | ORAL_TABLET | Freq: Every day | ORAL | 2 refills | Status: DC

## 2017-04-05 NOTE — Patient Instructions (Signed)

## 2017-04-05 NOTE — Progress Notes (Signed)
Subjective:  Patient ID: Tonya Paul, female    DOB: 1975-08-16  Age: 42 y.o. MRN: [id number]  CC: Diabetes   HPI Trinidi Toppins presents for follow up of diabetes. Symptoms:none. Patient denies foot ulcerations, paresthesia of the feet and vomitting.  Evaluation to date has been included: hemoglobin A1C and microalbuminuria.  Reports not consistently checking her blood glucose. She does not bring glucometer or log with her. Treatment to date: insulin and metformin.  History  of HTN. She does not check BP at  home. Cardiac symptoms none. Patient denies chest pain, chest pressure/discomfort, claudication, dyspnea, lower extremity edema, near-syncope, palpitations and syncope.  Cardiovascular risk factors: diabetes mellitus, hypertension and sedentary lifestyle. Use of agents associated with hypertension: none. History of target organ damage: none.  Anxiety : She reports having symptoms " my whole life". She reports history of ED visit in October related to anxiety.Associated symptoms include excessive worry, racing thoughts. Aggravated by driving on the highway. Risk factors: recent negative events she reports in the process of a divorce. She denies any SI/HI.   Outpatient Medications Prior to Visit  Medication Sig Dispense Refill  . Blood Glucose Monitoring Suppl (TRUE METRIX METER) DEVI 1 each by Does not apply route 3 (three) times daily before meals. 1 Device 0  . glucose blood (RELION GLUCOSE TEST STRIPS) test strip Use as instructed 100 each 12  . Insulin Syringe-Needle U-100 (B-D INS SYRINGE 0.5CC/31GX5/16) 31G X 5/16" 0.5 ML MISC USE AS DIRECTED DAILY FOR INSULIN ADMINISTRATION 100 each 1  . LORazepam (ATIVAN) 0.5 MG tablet Take 1 tablet (0.5 mg total) by mouth every 8 (eight) hours as needed for anxiety. 30 tablet 0  . meclizine (ANTIVERT) 25 MG tablet Take 25 mg by mouth 3 (three) times daily as needed for dizziness or nausea.    Marland Kitchen omeprazole (PRILOSEC) 20 MG capsule Take 1 capsule (20  mg total) by mouth daily. (Patient taking differently: Take 20 mg by mouth as needed. ) 30 capsule 3  . promethazine (PHENERGAN) 12.5 MG tablet Take 1-2 tablets (12.5-25 mg total) every 6 (six) hours as needed by mouth for nausea or vomiting. 20 tablet 0  . rosuvastatin (CRESTOR) 10 MG tablet Take 1 tablet (10 mg total) by mouth daily. 90 tablet 0  . insulin glargine (LANTUS) 100 UNIT/ML injection INJECT (24 UNITS TOTAL) INTO THE SKIN DAILY. 10 mL 11  . metFORMIN (GLUCOPHAGE-XR) 500 MG 24 hr tablet Take 2 tablets (1,000 mg total) by mouth 2 (two) times daily. 360 tablet 0  . propranolol ER (INDERAL LA) 80 MG 24 hr capsule Take 1 capsule (80 mg total) by mouth daily. 30 capsule 0  . oxyCODONE-acetaminophen (ROXICET) 5-325 MG tablet Take 1 tablet every 4 (four) hours as needed by mouth. 12 tablet 0  . sulfamethoxazole-trimethoprim (BACTRIM DS,SEPTRA DS) 800-160 MG tablet Take 1 tablet 2 (two) times daily by mouth. 28 tablet 0   No facility-administered medications prior to visit.     Review of Systems  Constitutional: Negative.   Respiratory: Negative.   Cardiovascular: Negative.   Psychiatric/Behavioral: Negative for suicidal ideas. The patient is nervous/anxious.     Objective:  BP 125/77 (BP Location: Left Arm, Patient Position: Sitting, Cuff Size: Normal)   Pulse 76   Temp 98.7 F (37.1 C) (Oral)   Resp 18   Ht 5\' 11"  (1.803 m)   Wt 213 lb (96.6 kg)   LMP 04/05/2017   SpO2 99%   BMI 29.71 kg/m   BP/Weight 04/05/2017  02/01/2017 95/32/0233  Systolic BP 435 686 168  Diastolic BP 77 78 93  Wt. (Lbs) 213 205 210  BMI 29.71 29.41 30.13   Physical Exam  Nursing note and vitals reviewed. Constitutional: She appears well-developed and well-nourished.  Neck: No JVD present.  Cardiovascular: Normal rate, regular rhythm, normal heart sounds and intact distal pulses.  Respiratory: Effort normal and breath sounds normal.  GI: Soft. Bowel sounds are normal. There is no tenderness.    Skin: Skin is warm and dry.  Psychiatric: Her mood appears anxious. She expresses no homicidal and no suicidal ideation. She expresses no suicidal plans and no homicidal plans. She is communicative. She is attentive.    Assessment & Plan:   1. Uncontrolled type 2 diabetes mellitus with hyperglycemia (HCC) Glipizide added. - HgB A1c - Glucose (CBG) - metFORMIN (GLUCOPHAGE-XR) 500 MG 24 hr tablet; Take 2 tablets (1,000 mg total) by mouth 2 (two) times daily.  Dispense: 360 tablet; Refill: 1 - insulin glargine (LANTUS) 100 UNIT/ML injection; INJECT (24 UNITS TOTAL) INTO THE SKIN DAILY.  Dispense: 10 mL; Refill: 11 - glipiZIDE (GLUCOTROL) 5 MG tablet; Take 1 tablet (5 mg total) by mouth daily before breakfast.  Dispense: 30 tablet; Refill: 2  2. Anxiety  - hydrOXYzine (ATARAX/VISTARIL) 25 MG tablet; Take 1 tablet (25 mg total) by mouth 3 (three) times daily as needed for anxiety.  Dispense: 30 tablet; Refill: 1  3. Essential hypertension Controlled on current dosage. - propranolol ER (INDERAL LA) 80 MG 24 hr capsule; Take 1 capsule (80 mg total) by mouth daily.  Dispense: 30 capsule; Refill: 2   Follow-up: Return in about 4 weeks (around 05/03/2017) for DM/Anxiety.   Alfonse Spruce FNP

## 2017-04-07 ENCOUNTER — Ambulatory Visit: Payer: Medicaid Other

## 2017-04-07 NOTE — Progress Notes (Deleted)
     04/07/2017 [name] 02/21/1976 [id number]   HPI:  Tonya Paul is a 42 y.o. female patient of Dr Gwenlyn Found, with a PMH below who presents today for hypertension clinic evaluation.  Blood Pressure Goal:  130/80  Current Medications:  Propranolol ER 80 mg qd Cardiac Hx:  Family Hx:  Social Hx:  Diet:  Exercise:  Home BP readings:  Intolerances:   CrCl cannot be calculated (Patient's most recent lab result is older than the maximum 21 days allowed.).  Wt Readings from Last 3 Encounters:  04/05/17 213 lb (96.6 kg)  02/01/17 205 lb (93 kg)  01/14/17 210 lb (95.3 kg)   BP Readings from Last 3 Encounters:  04/05/17 125/77  02/01/17 133/78  01/14/17 (!) 140/93   Pulse Readings from Last 3 Encounters:  04/05/17 76  02/01/17 74  01/14/17 84    Current Outpatient Medications  Medication Sig Dispense Refill  . Blood Glucose Monitoring Suppl (TRUE METRIX METER) DEVI 1 each by Does not apply route 3 (three) times daily before meals. 1 Device 0  . glipiZIDE (GLUCOTROL) 5 MG tablet Take 1 tablet (5 mg total) by mouth daily before breakfast. 30 tablet 2  . glucose blood (RELION GLUCOSE TEST STRIPS) test strip Use as instructed 100 each 12  . hydrOXYzine (ATARAX/VISTARIL) 25 MG tablet Take 1 tablet (25 mg total) by mouth 3 (three) times daily as needed for anxiety. 30 tablet 1  . insulin glargine (LANTUS) 100 UNIT/ML injection INJECT (24 UNITS TOTAL) INTO THE SKIN DAILY. 10 mL 11  . Insulin Syringe-Needle U-100 (B-D INS SYRINGE 0.5CC/31GX5/16) 31G X 5/16" 0.5 ML MISC USE AS DIRECTED DAILY FOR INSULIN ADMINISTRATION 100 each 1  . LORazepam (ATIVAN) 0.5 MG tablet Take 1 tablet (0.5 mg total) by mouth every 8 (eight) hours as needed for anxiety. 30 tablet 0  . meclizine (ANTIVERT) 25 MG tablet Take 25 mg by mouth 3 (three) times daily as needed for dizziness or nausea.    . metFORMIN (GLUCOPHAGE-XR) 500 MG 24 hr tablet Take 2 tablets (1,000 mg total) by mouth 2 (two) times  daily. 360 tablet 1  . omeprazole (PRILOSEC) 20 MG capsule Take 1 capsule (20 mg total) by mouth daily. (Patient taking differently: Take 20 mg by mouth as needed. ) 30 capsule 3  . promethazine (PHENERGAN) 12.5 MG tablet Take 1-2 tablets (12.5-25 mg total) every 6 (six) hours as needed by mouth for nausea or vomiting. 20 tablet 0  . propranolol ER (INDERAL LA) 80 MG 24 hr capsule Take 1 capsule (80 mg total) by mouth daily. 30 capsule 2  . rosuvastatin (CRESTOR) 10 MG tablet Take 1 tablet (10 mg total) by mouth daily. 90 tablet 0   No current facility-administered medications for this visit.     Allergies  Allergen Reactions  . Ace Inhibitors Shortness Of Breath    Reported by the patient after a trial of lisinopril prescribes by PCP  . Aspirin Anaphylaxis  . Ciprofloxacin Itching    Past Medical History:  Diagnosis Date  . Diabetes mellitus without complication ([place])   . Hypertension   . Prolonged QT syndrome   . Vertigo     Last menstrual period 04/05/2017.  No problem-specific Assessment & Plan notes found for this encounter.   Tommy Medal PharmD CPP [facility] [address] [place], [place] [address] [phone]

## 2017-04-10 ENCOUNTER — Emergency Department
Admission: EM | Admit: 2017-04-10 | Discharge: 2017-04-10 | Disposition: A | Discharge status: Home / Self Care | Payer: Medicaid Other | Attending: Emergency Medicine | Admitting: Emergency Medicine

## 2017-04-10 ENCOUNTER — Encounter: Payer: Self-pay | Admitting: Emergency Medicine

## 2017-04-10 ENCOUNTER — Other Ambulatory Visit: Payer: Self-pay

## 2017-04-10 DIAGNOSIS — R0981 Nasal congestion: Secondary | ICD-10-CM | POA: Insufficient documentation

## 2017-04-10 DIAGNOSIS — H66002 Acute suppurative otitis media without spontaneous rupture of ear drum, left ear: Secondary | ICD-10-CM

## 2017-04-10 DIAGNOSIS — Z794 Long term (current) use of insulin: Secondary | ICD-10-CM | POA: Diagnosis not present

## 2017-04-10 DIAGNOSIS — E119 Type 2 diabetes mellitus without complications: Secondary | ICD-10-CM | POA: Diagnosis not present

## 2017-04-10 DIAGNOSIS — J029 Acute pharyngitis, unspecified: Secondary | ICD-10-CM | POA: Diagnosis present

## 2017-04-10 DIAGNOSIS — I10 Essential (primary) hypertension: Secondary | ICD-10-CM | POA: Insufficient documentation

## 2017-04-10 DIAGNOSIS — Z79899 Other long term (current) drug therapy: Secondary | ICD-10-CM | POA: Diagnosis not present

## 2017-04-10 MED ORDER — AMOXICILLIN 500 MG PO CAPS: 500.0000 mg | ORAL_CAPSULE | Freq: Three times a day (TID) | ORAL | 0 refills | Status: DC

## 2017-04-10 MED ORDER — FLUTICASONE PROPIONATE 50 MCG/ACT NA SUSP: 2.0000 | Freq: Every day | NASAL | 0 refills | Status: DC

## 2017-04-10 NOTE — ED Triage Notes (Signed)
C/O sore throat and cold like sympotms for three weeks.  Left ear pain x 1 day.

## 2017-04-10 NOTE — Discharge Instructions (Signed)
Your exam shows some pus behind the left eardrum, consistent with an ear infection. Take the antibiotic as directed. Consider starting an OTC allergy medicine, for sinus drainage. See your provider for continued symptoms.

## 2017-04-10 NOTE — ED Notes (Signed)
Pt states she feels like a stabbing in her left ear since last night 10/10 pain. Pt states she feels like something is clogged.  Throat has been hurting for about 3 weeks off and on, Pt has had cough.  Pt states she has seen white patches on her throat. Cough worse at night when lying. Pt states she coughs when she has  Drainage going down the back of her throat.

## 2017-04-10 NOTE — ED Provider Notes (Signed)
[facility] Emergency Department Provider Note ____________________________________________  Time seen: 1400  I have reviewed the triage vital signs and the nursing notes.  HISTORY  Chief Complaint  Otalgia and Sore Throat  HPI Tonya Paul is a 42 y.o. female presents to the ED, accompanied by family members, for evaluation of sore throat and cold-like symptoms.  Patient reports the symptoms have been intermittent, and persistent for the last 3 weeks.  She also presents today with severe left ear pain.  She describes dullness to hearing, nausea and dizziness associated with her left ear.  She gives a history of tinnitus and vertigo, and reports persistent ringing to the left ear.  She denies any spontaneous drainage from the ear, or worsening of her vertigo.  She denies any fevers, chest pain, or shortness of breath.  Past Medical History:  Diagnosis Date  . Diabetes mellitus without complication ([place])   . Hypertension   . Prolonged QT syndrome   . Vertigo     Patient Active Problem List   Diagnosis Date Noted  . Long Q-T syndrome 01/14/2017  . Palpitations 02/03/2016  . Uterine fibroid 12/11/2015  . Family history of heart disease 12/03/2015  . Hemorrhoids 11/18/2015  . Left breast mass 09/01/2015  . Obesity 09/01/2015  . Type 2 diabetes mellitus, uncontrolled ([place]) 04/16/2015    Past Surgical History:  Procedure Laterality Date  . BREAST BIOPSY Left 09/03/2015   [place] Core bx, benign    Prior to Admission medications   Medication Sig Start Date End Date Taking? Authorizing Provider  amoxicillin (AMOXIL) 500 MG capsule Take 1 capsule (500 mg total) by mouth 3 (three) times daily. 04/10/17   Kadin Bera, Dannielle Karvonen, PA-C  Blood Glucose Monitoring Suppl (TRUE METRIX METER) DEVI 1 each by Does not apply route 3 (three) times daily before meals. 04/16/15   Arnoldo Morale, MD  fluticasone (FLONASE) 50 MCG/ACT nasal spray Place 2 sprays into both nostrils  daily. 04/10/17   Lyal Husted, Dannielle Karvonen, PA-C  glipiZIDE (GLUCOTROL) 5 MG tablet Take 1 tablet (5 mg total) by mouth daily before breakfast. 04/05/17   Fredia Beets R, FNP  glucose blood (RELION GLUCOSE TEST STRIPS) test strip Use as instructed 07/30/16   Alfonse Spruce, FNP  hydrOXYzine (ATARAX/VISTARIL) 25 MG tablet Take 1 tablet (25 mg total) by mouth 3 (three) times daily as needed for anxiety. 04/05/17   Hairston, Maylon Peppers, FNP  insulin glargine (LANTUS) 100 UNIT/ML injection INJECT (24 UNITS TOTAL) INTO THE SKIN DAILY. 04/05/17   Alfonse Spruce, FNP  Insulin Syringe-Needle U-100 (B-D INS SYRINGE 0.5CC/31GX5/16) 31G X 5/16" 0.5 ML MISC USE AS DIRECTED DAILY FOR INSULIN ADMINISTRATION 11/29/16   Alfonse Spruce, FNP  LORazepam (ATIVAN) 0.5 MG tablet Take 1 tablet (0.5 mg total) by mouth every 8 (eight) hours as needed for anxiety. 12/23/16 12/23/17  Earleen Newport, MD  meclizine (ANTIVERT) 25 MG tablet Take 25 mg by mouth 3 (three) times daily as needed for dizziness or nausea.    [provider]  metFORMIN (GLUCOPHAGE-XR) 500 MG 24 hr tablet Take 2 tablets (1,000 mg total) by mouth 2 (two) times daily. 04/05/17   Alfonse Spruce, FNP  omeprazole (PRILOSEC) 20 MG capsule Take 1 capsule (20 mg total) by mouth daily. Patient taking differently: Take 20 mg by mouth as needed.  11/18/15   Arnoldo Morale, MD  promethazine (PHENERGAN) 12.5 MG tablet Take 1-2 tablets (12.5-25 mg total) every 6 (six) hours as needed  by mouth for nausea or vomiting. 02/01/17   Eula Listen, MD  propranolol ER (INDERAL LA) 80 MG 24 hr capsule Take 1 capsule (80 mg total) by mouth daily. 04/05/17   Alfonse Spruce, FNP  rosuvastatin (CRESTOR) 10 MG tablet Take 1 tablet (10 mg total) by mouth daily. 11/18/16   Alfonse Spruce, FNP    Allergies Ace inhibitors; Aspirin; and Ciprofloxacin  Family History  Problem Relation Age of Onset  . Hypertension Mother   . Lung  cancer Mother   . Throat cancer Father   . Diabetes Sister   . Lupus Maternal Grandmother   . Stroke Maternal Grandfather   . Sudden Cardiac Death Daughter   . Breast cancer Paternal Aunt     Social History Social History   Tobacco Use  . Smoking status: Never Smoker  . Smokeless tobacco: Never Used  Substance Use Topics  . Alcohol use: No  . Drug use: No    Review of Systems  Constitutional: Negative for fever. Eyes: Negative for visual changes. ENT: Positive for sore throat.  Left otalgia as above. Cardiovascular: Negative for chest pain. Respiratory: Negative for shortness of breath. Gastrointestinal: Negative for abdominal pain, vomiting and diarrhea. Genitourinary: Negative for dysuria. Musculoskeletal: Negative for back pain. Skin: Negative for rash. Neurological: Negative for headaches, focal weakness or numbness. ____________________________________________  PHYSICAL EXAM:  VITAL SIGNS: ED Triage Vitals  Enc Vitals Group     BP 04/10/17 1344 134/85     Pulse Rate 04/10/17 1344 79     Resp 04/10/17 1344 18     Temp 04/10/17 1344 98.6 F (37 C)     Temp Source 04/10/17 1344 Oral     SpO2 04/10/17 1344 97 %     Weight 04/10/17 1310 213 lb (96.6 kg)     Height 04/10/17 1310 5\' 11"  (1.803 m)     Head Circumference --      Peak Flow --      Pain Score 04/10/17 1310 6     Pain Loc --      Pain Edu? --      Excl. in [place]? --     Constitutional: Alert and oriented. Well appearing and in no distress. Head: Normocephalic and atraumatic. Eyes: Conjunctivae are normal. PERRL. Normal extraocular movements Ears: Canals clear. TMs intact bilaterally.  The left TM is injected, retracted, and shows an air-fluid level with purulent effusion. Nose: No congestion/rhinorrhea/epistaxis. Mouth/Throat: Mucous membranes are moist.  Uvula is midline and tonsils are flat.  No oropharyngeal lesions are appreciated.  There is evidence of postnasal drip on exam. Neck: Supple. No  thyromegaly. Hematological/Lymphatic/Immunological: No cervical lymphadenopathy. Cardiovascular: Normal rate, regular rhythm. Normal distal pulses. Respiratory: Normal respiratory effort. No wheezes/rales/rhonchi. Gastrointestinal: Soft and nontender. No distention. ____________________________________________  INITIAL IMPRESSION / ASSESSMENT AND PLAN / ED COURSE  Patient with ED evaluation of a 3-week complaint of intermittent cough and congestion.  She has a purulent otitis media on the left.  Her exam is otherwise benign.  She will be treated with a prescription for amoxicillin as well as Flonase for sinus congestion.  She will follow-up with her primary care provider or return to the ED as needed for acutely worsening symptoms. ____________________________________________  FINAL CLINICAL IMPRESSION(S) / ED DIAGNOSES  Final diagnoses:  Acute suppurative otitis media of left ear without spontaneous rupture of tympanic membrane, recurrence not specified      Melvenia Needles, PA-C 04/10/17 [place], Randall An,  MD 04/10/17 1929

## 2017-04-21 ENCOUNTER — Ambulatory Visit: Payer: Medicaid Other | Admitting: Family Medicine

## 2017-04-23 ENCOUNTER — Other Ambulatory Visit: Payer: Self-pay | Admitting: Family Medicine

## 2017-04-23 DIAGNOSIS — Z9189 Other specified personal risk factors, not elsewhere classified: Secondary | ICD-10-CM

## 2017-04-23 DIAGNOSIS — IMO0001 Reserved for inherently not codable concepts without codable children: Secondary | ICD-10-CM

## 2017-04-23 DIAGNOSIS — E1165 Type 2 diabetes mellitus with hyperglycemia: Principal | ICD-10-CM

## 2017-04-27 ENCOUNTER — Ambulatory Visit: Payer: Medicaid Other | Admitting: Podiatry

## 2017-04-27 ENCOUNTER — Encounter: Payer: Self-pay | Admitting: Podiatry

## 2017-04-27 DIAGNOSIS — L603 Nail dystrophy: Secondary | ICD-10-CM | POA: Diagnosis not present

## 2017-04-27 DIAGNOSIS — L600 Ingrowing nail: Secondary | ICD-10-CM | POA: Diagnosis not present

## 2017-04-27 MED ORDER — NEOMYCIN-POLYMYXIN-HC 1 % OT SOLN: OTIC | 1 refills | Status: DC

## 2017-04-27 NOTE — Patient Instructions (Signed)

## 2017-04-27 NOTE — Progress Notes (Signed)
She presents today states that she hit her toenail on her right great toe on the bathroom cabinet and pill part of the toenail off.  She states that it is sore it bled quite a bit but she would just like to go ahead and have it removed but does want to grow back.  States that her blood sugar has been slightly elevated recently because she is working from home and eating more.  Objective: Vital signs are stable she is alert and oriented x3 pulses are palpable.  Hallux nail right does not demonstrate any erythema no cellulitis drainage or odor.  Hallux nail is a dystrophic nail growing rather than out and has been pulled loose proximally and is folded over distally.  There is no sign of infection at this point.  Assessment: Nail dystrophy trauma hallux right.  Plan: Total nail avulsion today right hallux this was performed after 3 cc of a 50-50 mixture of Marcaine plain and lidocaine plain was infiltrated in a hallux block.  The toenail was removed after sterile Betadine skin prep in his normal sterile fashion.  The area was cleaned and necrotic tissue was removed dressed a compressive dressing was applied.  She will be given both oral and written home-going instructions for the care and soaking of her toe I will follow-up with her in 1-2 weeks to make sure this is healing.  I instructed her to make sure that she try to control her glycemic index so that this will go on to heal uneventfully.

## 2017-05-11 ENCOUNTER — Encounter: Payer: Self-pay | Admitting: Podiatry

## 2017-05-11 ENCOUNTER — Ambulatory Visit (INDEPENDENT_AMBULATORY_CARE_PROVIDER_SITE_OTHER): Payer: Medicaid Other | Admitting: Podiatry

## 2017-05-11 DIAGNOSIS — L600 Ingrowing nail: Secondary | ICD-10-CM

## 2017-05-11 NOTE — Progress Notes (Signed)
She presents today for follow-up of her nail avulsion hallux right.  States that is doing just great.  No problems.  Objective: Vital signs are stable alert and oriented x3.  Pulses are palpable.  Nail bed is gone on to heal uneventfully there is no erythema cellulitis drainage or odor pulses remain palpable.  Assessment: Well-healing nail avulsion hallux right.  Plan: Instructed her to keep the nail bed soft with Neosporin or a hand cream.  She will do this and follow-up with me on an as-needed basis.

## 2017-06-12 ENCOUNTER — Emergency Department: Payer: Medicaid Other

## 2017-06-12 ENCOUNTER — Encounter: Payer: Self-pay | Admitting: Medical Oncology

## 2017-06-12 ENCOUNTER — Emergency Department
Admission: EM | Admit: 2017-06-12 | Discharge: 2017-06-12 | Disposition: A | Discharge status: Home / Self Care | Payer: Medicaid Other | Attending: Emergency Medicine | Admitting: Emergency Medicine

## 2017-06-12 DIAGNOSIS — Z79899 Other long term (current) drug therapy: Secondary | ICD-10-CM | POA: Diagnosis not present

## 2017-06-12 DIAGNOSIS — Z794 Long term (current) use of insulin: Secondary | ICD-10-CM | POA: Diagnosis not present

## 2017-06-12 DIAGNOSIS — R109 Unspecified abdominal pain: Secondary | ICD-10-CM | POA: Diagnosis not present

## 2017-06-12 DIAGNOSIS — E119 Type 2 diabetes mellitus without complications: Secondary | ICD-10-CM | POA: Insufficient documentation

## 2017-06-12 DIAGNOSIS — I10 Essential (primary) hypertension: Secondary | ICD-10-CM | POA: Diagnosis not present

## 2017-06-12 LAB — COMPREHENSIVE METABOLIC PANEL
ALT: 25 U/L (ref 14–54)
AST: 28 U/L (ref 15–41)
Albumin: 4.1 g/dL (ref 3.5–5.0)
Alkaline Phosphatase: 54 U/L (ref 38–126)
Anion gap: 9 (ref 5–15)
BILIRUBIN TOTAL: 0.5 mg/dL (ref 0.3–1.2)
BUN: 12 mg/dL (ref 6–20)
CALCIUM: 9.3 mg/dL (ref 8.9–10.3)
CO2: 22 mmol/L (ref 22–32)
CREATININE: 0.82 mg/dL (ref 0.44–1.00)
Chloride: 104 mmol/L (ref 101–111)
Glucose, Bld: 219 mg/dL — ABNORMAL HIGH (ref 65–99)
Potassium: 3.8 mmol/L (ref 3.5–5.1)
Sodium: 135 mmol/L (ref 135–145)
TOTAL PROTEIN: 7.9 g/dL (ref 6.5–8.1)

## 2017-06-12 LAB — CBC
HCT: 35.9 % (ref 35.0–47.0)
Hemoglobin: 11.6 g/dL — ABNORMAL LOW (ref 12.0–16.0)
MCH: 26.8 pg (ref 26.0–34.0)
MCHC: 32.4 g/dL (ref 32.0–36.0)
MCV: 82.9 fL (ref 80.0–100.0)
Platelets: 438 10*3/uL (ref 150–440)
RBC: 4.33 MIL/uL (ref 3.80–5.20)
RDW: 14.8 % — ABNORMAL HIGH (ref 11.5–14.5)
WBC: 9.3 10*3/uL (ref 3.6–11.0)

## 2017-06-12 LAB — LIPASE, BLOOD: Lipase: 34 U/L (ref 11–51)

## 2017-06-12 NOTE — ED Triage Notes (Signed)
Pt reports she has been having abd pain x 5 months, yesterday she reports she "passed" some gallstones in her stool. Pt in NAD. Denies fever.

## 2017-06-12 NOTE — ED Provider Notes (Addendum)
[facility] Emergency Department Provider Note  ____________________________________________   I have reviewed the triage vital signs and the nursing notes. Where available I have reviewed prior notes and, if possible and indicated, outside hospital notes.    HISTORY  Chief Complaint Abdominal Pain    HPI Tonya Paul is a 42 y.o. female  who states "I go to the hospital for everything because I am nervous about my health" she states that over the last 5 months she has had episodic events that involve burning discomfort to her epigastric region associated with spicy food.  She was on antiacids but given the fact that she has long QTC she was taken off them.  She states that when she is not eating spicy food she does not have the symptoms.  A few days ago, she had her usual reflux-like symptoms, which was again a burning epigastric discomfort after spicy food, and then she had some loose stools and she noticed a small item in her stool that she does remember eating that was out of be used in color and she thought maybe it was a gauze sponge she would like to be evaluated for all stones.  Patient has no symptoms at this time.   Past Medical History:  Diagnosis Date  . Diabetes mellitus without complication ([place])   . Hypertension   . Prolonged QT syndrome   . Vertigo     Patient Active Problem List   Diagnosis Date Noted  . Long Q-T syndrome 01/14/2017  . Palpitations 02/03/2016  . Uterine fibroid 12/11/2015  . Family history of heart disease 12/03/2015  . Hemorrhoids 11/18/2015  . Left breast mass 09/01/2015  . Obesity 09/01/2015  . Type 2 diabetes mellitus, uncontrolled ([place]) 04/16/2015    Past Surgical History:  Procedure Laterality Date  . BREAST BIOPSY Left 09/03/2015   [place] Core bx, benign    Prior to Admission medications   Medication Sig Start Date End Date Taking? Authorizing Provider  Blood Glucose Monitoring Suppl (TRUE METRIX METER) DEVI  1 each by Does not apply route 3 (three) times daily before meals. 04/16/15   Charlott Rakes, MD  fluticasone (FLONASE) 50 MCG/ACT nasal spray Place 2 sprays into both nostrils daily. 04/10/17   Menshew, Dannielle Karvonen, PA-C  glipiZIDE (GLUCOTROL) 5 MG tablet Take 1 tablet (5 mg total) by mouth daily before breakfast. 04/05/17   Fredia Beets R, FNP  glucose blood (RELION GLUCOSE TEST STRIPS) test strip Use as instructed 07/30/16   Alfonse Spruce, FNP  hydrOXYzine (ATARAX/VISTARIL) 25 MG tablet Take 1 tablet (25 mg total) by mouth 3 (three) times daily as needed for anxiety. 04/05/17   Hairston, Maylon Peppers, FNP  insulin glargine (LANTUS) 100 UNIT/ML injection INJECT (24 UNITS TOTAL) INTO THE SKIN DAILY. 04/05/17   Alfonse Spruce, FNP  Insulin Syringe-Needle U-100 (B-D INS SYRINGE 0.5CC/31GX5/16) 31G X 5/16" 0.5 ML MISC USE AS DIRECTED DAILY FOR INSULIN ADMINISTRATION 11/29/16   Alfonse Spruce, FNP  LORazepam (ATIVAN) 0.5 MG tablet Take 1 tablet (0.5 mg total) by mouth every 8 (eight) hours as needed for anxiety. 12/23/16 12/23/17  Earleen Newport, MD  meclizine (ANTIVERT) 25 MG tablet Take 25 mg by mouth 3 (three) times daily as needed for dizziness or nausea.    [provider]  metFORMIN (GLUCOPHAGE-XR) 500 MG 24 hr tablet Take 2 tablets (1,000 mg total) by mouth 2 (two) times daily. 04/05/17   Alfonse Spruce, FNP  NEOMYCIN-POLYMYXIN-HYDROCORTISONE (CORTISPORIN) 1 %  SOLN OTIC solution Apply 1-2 drops to toe BID after soaking 04/27/17   Hyatt, Max T, DPM  omeprazole (PRILOSEC) 20 MG capsule Take 1 capsule (20 mg total) by mouth daily. Patient taking differently: Take 20 mg by mouth as needed.  11/18/15   Charlott Rakes, MD  promethazine (PHENERGAN) 12.5 MG tablet Take 1-2 tablets (12.5-25 mg total) every 6 (six) hours as needed by mouth for nausea or vomiting. 02/01/17   Eula Listen, MD  propranolol ER (INDERAL LA) 80 MG 24 hr capsule Take 1 capsule (80 mg  total) by mouth daily. 04/05/17   Alfonse Spruce, FNP  rosuvastatin (CRESTOR) 10 MG tablet TAKE 1 TABLET BY MOUTH EVERY DAY 04/25/17   Alfonse Spruce, FNP    Allergies Ace inhibitors; Aspirin; and Ciprofloxacin  Family History  Problem Relation Age of Onset  . Hypertension Mother   . Lung cancer Mother   . Throat cancer Father   . Diabetes Sister   . Lupus Maternal Grandmother   . Stroke Maternal Grandfather   . Sudden Cardiac Death Daughter   . Breast cancer Paternal Aunt     Social History Social History   Tobacco Use  . Smoking status: Never Smoker  . Smokeless tobacco: Never Used  Substance Use Topics  . Alcohol use: No  . Drug use: No    Review of Systems Constitutional: No fever/chills Eyes: No visual changes. ENT: No sore throat. No stiff neck no neck pain Cardiovascular: Denies chest pain. Respiratory: Denies shortness of breath. Gastrointestinal:   no vomiting.  No diarrhea.  No constipation. Genitourinary: Negative for dysuria. Musculoskeletal: Negative lower extremity swelling Skin: Negative for rash. Neurological: Negative for severe headaches, focal weakness or numbness.   ____________________________________________   PHYSICAL EXAM:  VITAL SIGNS: ED Triage Vitals  Enc Vitals Group     BP 06/12/17 1046 (!) 146/76     Pulse Rate 06/12/17 1046 84     Resp 06/12/17 1046 18     Temp 06/12/17 1046 98.4 F (36.9 C)     Temp Source 06/12/17 1046 Oral     SpO2 06/12/17 1046 99 %     Weight 06/12/17 1047 185 lb (83.9 kg)     Height 06/12/17 1047 5\' 10"  (1.778 m)     Head Circumference --      Peak Flow --      Pain Score 06/12/17 1046 4     Pain Loc --      Pain Edu? --      Excl. in [place]? --     Constitutional: Alert and oriented. Well appearing and in no acute distress. Eyes: Conjunctivae are normal Head: Atraumatic HEENT: No congestion/rhinnorhea. Mucous membranes are moist.  Oropharynx non-erythematous Neck:   Nontender with no  meningismus, no masses, no stridor Cardiovascular: Normal rate, regular rhythm. Grossly normal heart sounds.  Good peripheral circulation. Respiratory: Normal respiratory effort.  No retractions. Lungs CTAB. Abdominal: Soft and nontender. No distention. No guarding no rebound Back:  There is no focal tenderness or step off.  there is no midline tenderness there are no lesions noted. there is no CVA tenderness Musculoskeletal: No lower extremity tenderness, no upper extremity tenderness. No joint effusions, no DVT signs strong distal pulses no edema Neurologic:  Normal speech and language. No gross focal neurologic deficits are appreciated.  Skin:  Skin is warm, dry and intact. No rash noted. Psychiatric: Mood and affect are normal. Speech and behavior are normal.  ____________________________________________   LABS (  all labs ordered are listed, but only abnormal results are displayed)  Labs Reviewed  COMPREHENSIVE METABOLIC PANEL - Abnormal; Notable for the following components:      Result Value   Glucose, Bld 219 (*)    All other components within normal limits  CBC - Abnormal; Notable for the following components:   Hemoglobin 11.6 (*)    RDW 14.8 (*)    All other components within normal limits  LIPASE, BLOOD  URINALYSIS, COMPLETE (UACMP) WITH MICROSCOPIC  POC URINE PREG, ED    Pertinent labs  results that were available during my care of the patient were reviewed by me and considered in my medical decision making (see chart for details). ____________________________________________  EKG  I personally interpreted any EKGs ordered by me or triage  ____________________________________________  RADIOLOGY  Pertinent labs & imaging results that were available during my care of the patient were reviewed by me and considered in my medical decision making (see chart for details). If possible, patient and/or family made aware of any abnormal findings.  No results  found. ____________________________________________    PROCEDURES  Procedure(s) performed: None  Procedures  Critical Care performed: None  ____________________________________________   INITIAL IMPRESSION / ASSESSMENT AND PLAN / ED COURSE  Pertinent labs & imaging results that were available during my care of the patient were reviewed by me and considered in my medical decision making (see chart for details).  Patient here for evaluation of possible gallstones, her symptoms sound much more likely to be reflux to me however we will obtain an ultrasound to rule out current biliary colic.  It is not likely patient will require emergent surgery or admission for this however   ----------------------------------------- 2:49 PM on 06/12/2017 -----------------------------------------  No no signs or symptoms of significant disease blood work and ultrasound is reassuring, considering the patient's symptoms, medical history, and physical examination today, I have low suspicion for cholecystitis or biliary pathology, pancreatitis, perforation or bowel obstruction, hernia, intra-abdominal abscess, AAA or dissection, volvulus or intussusception, mesenteric ischemia, ischemic gut, pyelonephritis or appendicitis.  I do not think this fibrillated burning in her stomach or is ACS PE or dissection either   ____________________________________________   FINAL CLINICAL IMPRESSION(S) / ED DIAGNOSES  Final diagnoses:  Abdominal pain      This chart was dictated using voice recognition software.  Despite best efforts to proofread,  errors can occur which can change meaning.      Schuyler Amor, MD 06/12/17 1243    Schuyler Amor, MD 06/12/17 1450

## 2017-06-15 ENCOUNTER — Encounter: Payer: Self-pay | Admitting: Gastroenterology

## 2017-06-15 ENCOUNTER — Ambulatory Visit: Payer: Medicaid Other | Admitting: Gastroenterology

## 2017-06-15 VITALS — BP 138/87 | HR 92 | Temp 98.5°F | Ht 71.0 in | Wt 214.4 lb

## 2017-06-15 DIAGNOSIS — R1013 Epigastric pain: Secondary | ICD-10-CM | POA: Diagnosis not present

## 2017-06-15 DIAGNOSIS — G8929 Other chronic pain: Secondary | ICD-10-CM

## 2017-06-15 NOTE — Progress Notes (Signed)
Cephas Darby, MD [address]  [place]  Tonya Paul, [place] [address]  Main: [phone]  Fax: [phone]    Gastroenterology Consultation  Referring Provider:     No ref. provider found Primary Care Physician:  Patient, No Pcp Per Primary Gastroenterologist:  Dr. Cephas Darby Reason for Consultation:     Epigastric pain, passing gallstones        HPI:   Tonya Paul is a 42 y.o. African-American female with metabolic syndrome, diabetes, on insulin, hypertension seen in  consultation & management of epigastric pain. Patient has been experiencing intermittent episodes of burning type of epigastric pain radiating to right upper anterior chest and shoulder blades. Pain is worse after eating and sometimes wakes her up from sleep. She reports that she has been passing stones in her stools and showed me the picture today. She passed 3 stones so far, including 1 today. Her stools are generally loose, nonbloody. She denies right upper quadrant pain. She went to ER this past weekend due to similar episode, underwent right upper quadrant ultrasound which revealed contracted gallbladder. There was no evidence of gallstones. She has these symptoms for the last 5 months. She reports nausea associated with these episodes. Her LFTswere unremarkable. She has mild anemia. She denies smoking, alcohol, or IV drug abuse. She denies weight loss. She was taking omeprazole, rad out of prescription. She does consume fried foods, red meat. She says she is trying to lose weight. She does not exercise  NSAIDs: none  Antiplts/Anticoagulants/Anti thrombotics: none  GI Procedures: none She denies abdominal surgeries She works from home, Teacher, early years/pre for [employer] She denies family history of GI malignancy   Past Medical History:  Diagnosis Date  . Diabetes mellitus without complication ([place])   . Hypertension   . Prolonged QT syndrome   . Vertigo     Past Surgical History:  Procedure Laterality  Date  . BREAST BIOPSY Left 09/03/2015   [place] Core bx, benign     Current Outpatient Medications:  .  Blood Glucose Monitoring Suppl (TRUE METRIX METER) DEVI, 1 each by Does not apply route 3 (three) times daily before meals., Disp: 1 Device, Rfl: 0 .  glipiZIDE (GLUCOTROL) 5 MG tablet, Take 1 tablet (5 mg total) by mouth daily before breakfast., Disp: 30 tablet, Rfl: 2 .  glucose blood (RELION GLUCOSE TEST STRIPS) test strip, Use as instructed, Disp: 100 each, Rfl: 12 .  insulin glargine (LANTUS) 100 UNIT/ML injection, INJECT (24 UNITS TOTAL) INTO THE SKIN DAILY. (Patient taking differently: Inject 25 Units into the skin at bedtime. ), Disp: 10 mL, Rfl: 11 .  Insulin Syringe-Needle U-100 (B-D INS SYRINGE 0.5CC/31GX5/16) 31G X 5/16" 0.5 ML MISC, USE AS DIRECTED DAILY FOR INSULIN ADMINISTRATION, Disp: 100 each, Rfl: 1 .  metFORMIN (GLUCOPHAGE-XR) 500 MG 24 hr tablet, Take 2 tablets (1,000 mg total) by mouth 2 (two) times daily., Disp: 360 tablet, Rfl: 1 .  propranolol ER (INDERAL LA) 80 MG 24 hr capsule, Take 1 capsule (80 mg total) by mouth daily., Disp: 30 capsule, Rfl: 2 .  rosuvastatin (CRESTOR) 10 MG tablet, TAKE 1 TABLET BY MOUTH EVERY DAY, Disp: 90 tablet, Rfl: 0 .  fluticasone (FLONASE) 50 MCG/ACT nasal spray, Place 2 sprays into both nostrils daily. (Patient not taking: Reported on 06/12/2017), Disp: 16 g, Rfl: 0 .  hydrOXYzine (ATARAX/VISTARIL) 25 MG tablet, Take 1 tablet (25 mg total) by mouth 3 (three) times daily as needed for anxiety. (Patient not taking: Reported  on 06/12/2017), Disp: 30 tablet, Rfl: 1 .  LORazepam (ATIVAN) 0.5 MG tablet, Take 1 tablet (0.5 mg total) by mouth every 8 (eight) hours as needed for anxiety. (Patient not taking: Reported on 06/12/2017), Disp: 30 tablet, Rfl: 0 .  NEOMYCIN-POLYMYXIN-HYDROCORTISONE (CORTISPORIN) 1 % SOLN OTIC solution, Apply 1-2 drops to toe BID after soaking (Patient not taking: Reported on 06/12/2017), Disp: 10 mL, Rfl: 1 .  omeprazole  (PRILOSEC) 20 MG capsule, Take 1 capsule (20 mg total) by mouth daily. (Patient not taking: Reported on 06/12/2017), Disp: 30 capsule, Rfl: 3 .  promethazine (PHENERGAN) 12.5 MG tablet, Take 1-2 tablets (12.5-25 mg total) every 6 (six) hours as needed by mouth for nausea or vomiting. (Patient not taking: Reported on 06/12/2017), Disp: 20 tablet, Rfl: 0  Family History  Problem Relation Age of Onset  . Hypertension Mother   . Lung cancer Mother   . Throat cancer Father   . Diabetes Sister   . Lupus Maternal Grandmother   . Stroke Maternal Grandfather   . Sudden Cardiac Death Daughter   . Breast cancer Paternal Aunt      Social History   Tobacco Use  . Smoking status: Never Smoker  . Smokeless tobacco: Never Used  Substance Use Topics  . Alcohol use: No  . Drug use: No    Allergies as of 06/15/2017 - Review Complete 06/15/2017  Allergen Reaction Noted  . Ace inhibitors Shortness Of Breath 01/27/2016  . Aspirin Anaphylaxis 01/11/2015  . Ciprofloxacin Itching 04/16/2015    Review of Systems:    All systems reviewed and negative except where noted in HPI.   Physical Exam:  BP 138/87   Pulse 92   Temp 98.5 F (36.9 C) (Oral)   Ht 5\' 11"  (1.803 m)   Wt 214 lb 6.4 oz (97.3 kg)   LMP 06/05/2017 (Approximate)   BMI 29.90 kg/m  Patient's last menstrual period was 06/05/2017 (approximate).  General:   Alert,  Well-developed, well-nourished, pleasant and cooperative in NAD Head:  Normocephalic and atraumatic. Eyes:  Sclera clear, no icterus.   Conjunctiva pink. Ears:  Normal auditory acuity. Nose:  No deformity, discharge, or lesions. Mouth:  No deformity or lesions,oropharynx pink & moist. Neck:  Supple; no masses or thyromegaly. Lungs:  Respirations even and unlabored.  Clear throughout to auscultation.   No wheezes, crackles, or rhonchi. No acute distress. Heart:  Regular rate and rhythm; no murmurs, clicks, rubs, or gallops. Abdomen:  Normal bowel sounds. Soft, obese,  mild epigastric discomfort and non-distended without masses, hepatosplenomegaly or hernias noted.  No guarding or rebound tenderness.   Rectal: Not performed Msk:  Symmetrical without gross deformities. Good, equal movement & strength bilaterally. Pulses:  Normal pulses noted. Extremities:  No clubbing or edema.  No cyanosis. Neurologic:  Alert and oriented x3;  grossly normal neurologically. Skin:  Intact without significant lesions or rashes. No jaundice. Psych:  Alert and cooperative. Normal mood and affect.  Imaging Studies: Reviewed  Assessment and Plan:   Zafira Munos is a 42 y.o. African-American female with metabolic syndrome, diabetes on insulin, hypertension presents with chronic intermittent epigastric burning pain, radiation to right shoulder worse after eating and passing gallstones. Differentials include dyspepsia or symptomatic cholelithiasis  - EGD with biopsies - Referral to general surgery - Counseled her about bariatric wellness program offered by [facility] - Recommended her to avoid red meat, fried foods   Follow up in 2 months   Cephas Darby, MD

## 2017-06-20 ENCOUNTER — Encounter: Payer: Self-pay | Admitting: Surgery

## 2017-06-20 ENCOUNTER — Ambulatory Visit (INDEPENDENT_AMBULATORY_CARE_PROVIDER_SITE_OTHER): Payer: Medicaid Other | Admitting: Surgery

## 2017-06-20 DIAGNOSIS — K219 Gastro-esophageal reflux disease without esophagitis: Secondary | ICD-10-CM

## 2017-06-20 NOTE — Progress Notes (Signed)
Surgical Clinic History and Physical  Referring provider:  Dr. Marius Ditch, [facility]  HISTORY OF PRESENT ILLNESS (HPI):  42 y.o. female presents for evaluation of epigastric pain. Patient reports recurrent intermittent episodes of burning epigastric pain that she describes as worse after eating any foods (not specifically fatty foods), noticeably worse since she's gained weight after changing jobs, worse at night, and only partially relieved with recent prescription of omeprazole. BM's are described as soft and regular, and she also describes that she has passed 3 firm objects in her BM's, of which she has taken photographs and about which she asks whether these could represent gallstones or her metformin pill, adding the largest passed object resembles her metformin and describes this passed object broke apart "pretty easily". Patient otherwise denies any yellowing of her eyes or skin, fever/chills, CP, or SOB.   PAST MEDICAL HISTORY (PMH):  Past Medical History:  Diagnosis Date  . Diabetes mellitus without complication ([place])   . Hemorrhoids 11/18/2015  . Hypertension   . Left breast mass 09/01/2015  . 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 Palpitations 02/03/2016   Palpitations  . Prolonged QT syndrome   . Vertigo      PAST SURGICAL HISTORY ([place]):  Past Surgical History:  Procedure Laterality Date  . BREAST BIOPSY Left 09/03/2015   [place] Core bx, benign     MEDICATIONS:  Prior to Admission medications   Medication Sig Start Date End Date Taking? Authorizing Provider  Blood Glucose Monitoring Suppl (TRUE METRIX METER) DEVI 1 each by Does not apply route 3 (three) times daily before meals. 04/16/15  Yes Newlin, Charlane Ferretti, MD  glucose blood (RELION GLUCOSE TEST STRIPS) test strip Use as instructed 07/30/16  Yes Hairston, Mandesia R, FNP  insulin glargine (LANTUS) 100 UNIT/ML injection INJECT (24 UNITS TOTAL) INTO THE SKIN DAILY. Patient taking differently: Inject  25 Units into the skin at bedtime.  04/05/17  Yes Hairston, Maylon Peppers, FNP  Insulin Syringe-Needle U-100 (B-D INS SYRINGE 0.5CC/31GX5/16) 31G X 5/16" 0.5 ML MISC USE AS DIRECTED DAILY FOR INSULIN ADMINISTRATION 11/29/16  Yes Hairston, Mandesia R, FNP  LORazepam (ATIVAN) 0.5 MG tablet Take 1 tablet (0.5 mg total) by mouth every 8 (eight) hours as needed for anxiety. 12/23/16 12/23/17 Yes Earleen Newport, MD  metFORMIN (GLUCOPHAGE-XR) 500 MG 24 hr tablet Take 2 tablets (1,000 mg total) by mouth 2 (two) times daily. 04/05/17  Yes Hairston, Maylon Peppers, FNP  omeprazole (PRILOSEC) 20 MG capsule Take 1 capsule (20 mg total) by mouth daily. 11/18/15  Yes Charlott Rakes, MD  promethazine (PHENERGAN) 12.5 MG tablet Take 1-2 tablets (12.5-25 mg total) every 6 (six) hours as needed by mouth for nausea or vomiting. 02/01/17  Yes Eula Listen, MD  propranolol ER (INDERAL LA) 80 MG 24 hr capsule Take 1 capsule (80 mg total) by mouth daily. 04/05/17  Yes Alfonse Spruce, FNP     ALLERGIES:  Allergies  Allergen Reactions  . Ace Inhibitors Shortness Of Breath    Reported by the patient after a trial of lisinopril prescribes by PCP  . Aspirin Anaphylaxis  . Ciprofloxacin Itching     SOCIAL HISTORY:  Social History   Socioeconomic History  . Marital status: Married    Spouse name: Not on file  . Number of children: Not on file  . Years of education: Not on file  . Highest education level: Not on file  Occupational History  . Not on file  Social Needs  .  Financial resource strain: Not on file  . Food insecurity:    Worry: Not on file    Inability: Not on file  . Transportation needs:    Medical: Not on file    Non-medical: Not on file  Tobacco Use  . Smoking status: Never Smoker  . Smokeless tobacco: Never Used  Substance and Sexual Activity  . Alcohol use: No  . Drug use: No  . Sexual activity: Yes  Lifestyle  . Physical activity:    Days per week: Not on file    Minutes per  session: Not on file  . Stress: Not on file  Relationships  . Social connections:    Talks on phone: Not on file    Gets together: Not on file    Attends religious service: Not on file    Active member of club or organization: Not on file    Attends meetings of clubs or organizations: Not on file    Relationship status: Not on file  . Intimate partner violence:    Fear of current or ex partner: Not on file    Emotionally abused: Not on file    Physically abused: Not on file    Forced sexual activity: Not on file  Other Topics Concern  . Not on file  Social History Narrative  . Not on file    The patient currently resides (home / rehab facility / nursing home): Home The patient normally is (ambulatory / bedbound): Ambulatory  FAMILY HISTORY:  Family History  Problem Relation Age of Onset  . Hypertension Mother   . Lung cancer Mother   . Throat cancer Father   . Diabetes Sister   . Lupus Maternal Grandmother   . Stroke Maternal Grandfather   . Sudden Cardiac Death Daughter   . Breast cancer Paternal Aunt     Otherwise negative/non-contributory.  REVIEW OF SYSTEMS:  Constitutional: denies any other weight loss, fever, chills, or sweats  Eyes: denies any other vision changes, history of eye injury  ENT: denies sore throat, hearing problems  Respiratory: denies shortness of breath, wheezing  Cardiovascular: denies chest pain, palpitations  Gastrointestinal: abdominal pain, N/V, and bowel function as per HPI Musculoskeletal: denies any other joint pains or cramps  Skin: Denies any other rashes or skin discolorations Neurological: denies any other headache, dizziness, weakness  Psychiatric: Denies any other depression, anxiety   All other review of systems were otherwise negative   VITAL SIGNS:  BP (!) 143/93   Pulse 79   Temp 98 F (36.7 C) (Oral)   Ht 5\' 11"  (1.803 m)   Wt 212 lb 6.4 oz (96.3 kg)   LMP 06/05/2017 (Approximate)   BMI 29.62 kg/m   PHYSICAL EXAM:   Constitutional:  -- Obese body habitus  -- Awake, alert, and oriented x3  Eyes:  -- Pupils equally round and reactive to light  -- No scleral icterus  Ear, nose, throat:  -- No jugular venous distension -- No nasal drainage, bleeding Pulmonary:  -- No crackles  -- Equal breath sounds bilaterally -- Breathing non-labored at rest Cardiovascular:  -- S1, S2 present  -- No pericardial rubs  Gastrointestinal:  -- Abdomen soft, overweight but non-distended with mild RUQ abdominal pain to deep palpation, no guarding/rebound tenderness -- No abdominal masses appreciated, pulsatile or otherwise  Musculoskeletal and Integumentary:  -- Wounds or skin discoloration: None appreciated -- Extremities: B/L UE and LE FROM, hands and feet warm  Neurologic:  -- Motor function: Intact and  symmetric -- Sensation: Intact and symmetric  Labs:  CBC Latest Ref Rng & Units 06/12/2017 02/01/2017 12/23/2016  WBC 3.6 - 11.0 K/uL 9.3 8.3 9.3  Hemoglobin 12.0 - 16.0 g/dL 11.6(L) 12.4 11.9(L)  Hematocrit 35.0 - 47.0 % 35.9 37.7 35.8  Platelets 150 - 440 K/uL 438 433 401   CMP Latest Ref Rng & Units 06/12/2017 02/01/2017 12/23/2016  Glucose 65 - 99 mg/dL 219(H) 245(H) 168(H)  BUN 6 - 20 mg/dL 12 10 11   Creatinine 0.44 - 1.00 mg/dL 0.82 0.82 0.71  Sodium 135 - 145 mmol/L 135 136 136  Potassium 3.5 - 5.1 mmol/L 3.8 3.3(L) 3.7  Chloride 101 - 111 mmol/L 104 105 107  CO2 22 - 32 mmol/L 22 21(L) 21(L)  Calcium 8.9 - 10.3 mg/dL 9.3 9.1 9.2  Total Protein 6.5 - 8.1 g/dL 7.9 - -  Total Bilirubin 0.3 - 1.2 mg/dL 0.5 - -  Alkaline Phos 38 - 126 U/L 54 - -  AST 15 - 41 U/L 28 - -  ALT 14 - 54 U/L 25 - -    Imaging studies:  Limited RUQ Abdominal Ultrasound (06/12/2017) No gallstones or wall thickening visualized. Gallbladder somewhat contracted as patient has eaten within past 3 hours. No sonographic Murphy sign per sonographer. Common bile duct diameter: 5.3 mm.  Assessment/Plan:  42 y.o. female with  GERD-associated epigastric abdominal pain without cholelithiasis, complicated by co-morbidities including DM, HTN, prolonged QT syndrome with palpations, history of symptomatic hemorrhoids, vertigo, and benign Left breast mass.   - agree with upcoming EGD   - currently no indication for surgical intervention   - if EGD findings do not explain abdominal pain, may reconsider cholecystectomy  - return to clinic as needed, instructed to call if any questions or concerns  - benefits of weight loss discussed, and weight loss encouraged  All of the above recommendations were discussed with the patient and patient's family, and all of patient's and family's questions were answered to their expressed satisfaction.  Thank you for the opportunity to participate in this patient's care.  -- Marilynne Drivers Rosana Hoes, MD, [place]: [facility] - Partnering for exceptional care. Office: [phone]

## 2017-06-20 NOTE — Patient Instructions (Signed)
Please call our office with any questions or concerns. 

## 2017-06-21 ENCOUNTER — Encounter: Payer: Self-pay | Admitting: *Deleted

## 2017-06-21 ENCOUNTER — Ambulatory Visit: Payer: Medicaid Other | Admitting: Anesthesiology

## 2017-06-21 ENCOUNTER — Ambulatory Visit
Admission: RE | Admit: 2017-06-21 | Discharge: 2017-06-21 | Disposition: A | Discharge status: Home / Self Care | Payer: Medicaid Other | Source: Ambulatory Visit | Attending: Gastroenterology | Admitting: Gastroenterology

## 2017-06-21 ENCOUNTER — Encounter
Admission: RE | Disposition: A | Discharge status: Home / Self Care | Payer: Self-pay | Source: Ambulatory Visit | Attending: Gastroenterology

## 2017-06-21 DIAGNOSIS — I071 Rheumatic tricuspid insufficiency: Secondary | ICD-10-CM | POA: Diagnosis not present

## 2017-06-21 DIAGNOSIS — K219 Gastro-esophageal reflux disease without esophagitis: Secondary | ICD-10-CM | POA: Insufficient documentation

## 2017-06-21 DIAGNOSIS — R1013 Epigastric pain: Secondary | ICD-10-CM

## 2017-06-21 DIAGNOSIS — I4581 Long QT syndrome: Secondary | ICD-10-CM | POA: Diagnosis not present

## 2017-06-21 DIAGNOSIS — Z888 Allergy status to other drugs, medicaments and biological substances status: Secondary | ICD-10-CM | POA: Insufficient documentation

## 2017-06-21 DIAGNOSIS — Z79899 Other long term (current) drug therapy: Secondary | ICD-10-CM | POA: Insufficient documentation

## 2017-06-21 DIAGNOSIS — Z881 Allergy status to other antibiotic agents status: Secondary | ICD-10-CM | POA: Diagnosis not present

## 2017-06-21 DIAGNOSIS — G8929 Other chronic pain: Secondary | ICD-10-CM

## 2017-06-21 DIAGNOSIS — E119 Type 2 diabetes mellitus without complications: Secondary | ICD-10-CM | POA: Diagnosis not present

## 2017-06-21 DIAGNOSIS — Z794 Long term (current) use of insulin: Secondary | ICD-10-CM | POA: Insufficient documentation

## 2017-06-21 DIAGNOSIS — I10 Essential (primary) hypertension: Secondary | ICD-10-CM | POA: Diagnosis not present

## 2017-06-21 DIAGNOSIS — K449 Diaphragmatic hernia without obstruction or gangrene: Secondary | ICD-10-CM | POA: Insufficient documentation

## 2017-06-21 DIAGNOSIS — R12 Heartburn: Secondary | ICD-10-CM | POA: Diagnosis not present

## 2017-06-21 DIAGNOSIS — R002 Palpitations: Secondary | ICD-10-CM | POA: Insufficient documentation

## 2017-06-21 DIAGNOSIS — K2950 Unspecified chronic gastritis without bleeding: Secondary | ICD-10-CM | POA: Insufficient documentation

## 2017-06-21 DIAGNOSIS — Z886 Allergy status to analgesic agent status: Secondary | ICD-10-CM | POA: Insufficient documentation

## 2017-06-21 HISTORY — PX: ESOPHAGOGASTRODUODENOSCOPY (EGD) WITH PROPOFOL: SHX5813

## 2017-06-21 LAB — GLUCOSE, CAPILLARY: GLUCOSE-CAPILLARY: 118 mg/dL — AB (ref 65–99)

## 2017-06-21 SURGERY — ESOPHAGOGASTRODUODENOSCOPY (EGD) WITH PROPOFOL
Anesthesia: General

## 2017-06-21 MED ORDER — SODIUM CHLORIDE 0.9 % IV SOLN
INTRAVENOUS | Status: DC
  Administered 2017-06-21: 14:00:00 via INTRAVENOUS

## 2017-06-21 MED ORDER — GLYCOPYRROLATE 0.2 MG/ML IJ SOLN
INTRAMUSCULAR | Status: DC | PRN
  Administered 2017-06-21: 0.2 mg via INTRAVENOUS

## 2017-06-21 MED ORDER — MIDAZOLAM HCL 2 MG/2ML IJ SOLN
INTRAMUSCULAR | Status: DC | PRN
  Administered 2017-06-21: 2 mg via INTRAVENOUS

## 2017-06-21 MED ORDER — PROPOFOL 10 MG/ML IV BOLUS
INTRAVENOUS | Status: DC | PRN
  Administered 2017-06-21 (×5): 20 mg via INTRAVENOUS

## 2017-06-21 MED ORDER — OMEPRAZOLE 40 MG PO CPDR: 40.0000 mg | DELAYED_RELEASE_CAPSULE | Freq: Two times a day (BID) | ORAL | 0 refills | Status: DC

## 2017-06-21 MED ORDER — PROPOFOL 500 MG/50ML IV EMUL
INTRAVENOUS | Status: DC | PRN
  Administered 2017-06-21: 150 ug/kg/min via INTRAVENOUS

## 2017-06-21 MED ORDER — MIDAZOLAM HCL 2 MG/2ML IJ SOLN
INTRAMUSCULAR | Status: AC
  Filled 2017-06-21: qty 2

## 2017-06-21 MED ORDER — LIDOCAINE HCL (CARDIAC) 20 MG/ML IV SOLN
INTRAVENOUS | Status: DC | PRN
  Administered 2017-06-21: 50 mg via INTRAVENOUS

## 2017-06-21 NOTE — Anesthesia Preprocedure Evaluation (Addendum)
Anesthesia Evaluation  Patient identified by MRN, date of birth, ID band Patient awake    Reviewed: Allergy & Precautions, H&P , NPO status , reviewed documented beta blocker date and time   Airway Mallampati: II  TM Distance: >3 FB     Dental  (+) Chipped   Pulmonary    Pulmonary exam normal        Cardiovascular hypertension, Normal cardiovascular exam  2017 Study Conclusions  - Left ventricle: The cavity size was normal. Systolic function was   normal. Wall motion was normal; there were no regional wall   motion abnormalities. Doppler parameters are consistent with   abnormal left ventricular relaxation (grade 1 diastolic   dysfunction). - Atrial septum: No defect or patent foramen ovale was identified. - Tricuspid valve: There was mild-moderate regurgitation. - Pulmonary arteries: PA peak pressure: 32 mm Hg (S).     Neuro/Psych    GI/Hepatic GERD  ,  Endo/Other  diabetes  Renal/GU      Musculoskeletal   Abdominal   Peds  Hematology   Anesthesia Other Findings . Diabetes mellitus without complication ([place])  . Hemorrhoids 11/18/2015 . Hypertension  . Left breast mass 09/01/2015 . Long Q-T syndrome 01/14/2017  Long QT syndrome . Palpitations 02/03/2016  Palpitations . Prolonged QT syndrome  . Vertigo     PAST SURGICAL HISTORY ([place]):  Past Surgical History: Procedure Laterality Date . BREAST BIOPSY Left 09/03/2015  [place] Core bx, benign  Reproductive/Obstetrics                            Anesthesia Physical Anesthesia Plan  ASA: III  Anesthesia Plan: General   Post-op Pain Management:    Induction:   PONV Risk Score and Plan: Propofol infusion  Airway Management Planned:   Additional Equipment:   Intra-op Plan:   Post-operative Plan:   Informed Consent: I have reviewed the patients History and Physical, chart, labs and discussed the procedure including  the risks, benefits and alternatives for the proposed anesthesia with the patient or authorized representative who has indicated his/her understanding and acceptance.   Dental Advisory Given  Plan Discussed with: CRNA  Anesthesia Plan Comments:        Anesthesia Quick Evaluation

## 2017-06-21 NOTE — Anesthesia Postprocedure Evaluation (Signed)
Anesthesia Post Note  Patient: Tonya Paul  Procedure(s) Performed: ESOPHAGOGASTRODUODENOSCOPY (EGD) WITH PROPOFOL (N/A )  Patient location during evaluation: Endoscopy Anesthesia Type: General Level of consciousness: awake and alert Pain management: pain level controlled Vital Signs Assessment: post-procedure vital signs reviewed and stable Respiratory status: spontaneous breathing, nonlabored ventilation and respiratory function stable Cardiovascular status: blood pressure returned to baseline and stable Postop Assessment: no apparent nausea or vomiting Anesthetic complications: no     Last Vitals:  Vitals:   06/21/17 1300 06/21/17 1355  BP:  (!) 103/56  Pulse:    Resp:    Temp: 37.3 C 37.3 C    Last Pain:  Vitals:   06/21/17 1415  TempSrc:   PainSc: 0-No pain                 Alphonsus Sias

## 2017-06-21 NOTE — Anesthesia Post-op Follow-up Note (Signed)
Anesthesia QCDR form completed.        

## 2017-06-21 NOTE — Transfer of Care (Signed)
Immediate Anesthesia Transfer of Care Note  Patient: Tonya Paul  Procedure(s) Performed: ESOPHAGOGASTRODUODENOSCOPY (EGD) WITH PROPOFOL (N/A )  Patient Location: PACU  Anesthesia Type:MAC  Level of Consciousness: awake  Airway & Oxygen Therapy: Patient Spontanous Breathing  Post-op Assessment: Report given to RN  Post vital signs: stable  Last Vitals:  Vitals Value Taken Time  BP    Temp    Pulse 94 06/21/2017  1:55 PM  Resp 20 06/21/2017  1:55 PM  SpO2 100 % 06/21/2017  1:55 PM  Vitals shown include unvalidated device data.  Last Pain:  Vitals:   06/21/17 1239  TempSrc: Tympanic         Complications: No apparent anesthesia complications

## 2017-06-21 NOTE — Op Note (Signed)
[facility] Gastroenterology Patient Name: Tonya Paul Procedure Date: 06/21/2017 12:58 PM MRN: [id number] Account #: [id number] Date of Birth: 1975/05/24 Admit Type: Outpatient Age: 42 Room: [facility] ENDO ROOM 3 Gender: Female Note Status: Finalized Procedure:            Upper GI endoscopy Indications:          Dyspepsia, Heartburn Providers:            Lin Landsman MD, MD Medicines:            Monitored Anesthesia Care Complications:        No immediate complications. Estimated blood loss: None. Procedure:            Pre-Anesthesia Assessment:                       - Prior to the procedure, a History and Physical was                        performed, and patient medications and allergies were                        reviewed. The patient is competent. The risks and                        benefits of the procedure and the sedation options and                        risks were discussed with the patient. All questions                        were answered and informed consent was obtained.                        Patient identification and proposed procedure were                        verified by the physician, the nurse, the                        anesthesiologist, the anesthetist and the technician in                        the pre-procedure area in the procedure room in the                        endoscopy suite. Mental Status Examination: alert and                        oriented. Airway Examination: normal oropharyngeal                        airway and neck mobility. Respiratory Examination:                        clear to auscultation. CV Examination: normal.                        Prophylactic Antibiotics: The patient does not require  prophylactic antibiotics. Prior Anticoagulants: The                        patient has taken no previous anticoagulant or                        antiplatelet agents. ASA Grade Assessment: II - A                   patient with mild systemic disease. After reviewing the                        risks and benefits, the patient was deemed in                        satisfactory condition to undergo the procedure. The                        anesthesia plan was to use monitored anesthesia care                        (MAC). Immediately prior to administration of                        medications, the patient was re-assessed for adequacy                        to receive sedatives. The heart rate, respiratory rate,                        oxygen saturations, blood pressure, adequacy of                        pulmonary ventilation, and response to care were                        monitored throughout the procedure. The physical status                        of the patient was re-assessed after the procedure.                       After obtaining informed consent, the endoscope was                        passed under direct vision. Throughout the procedure,                        the patient's blood pressure, pulse, and oxygen                        saturations were monitored continuously. The Endoscope                        was introduced through the mouth, and advanced to the                        second part of duodenum. The upper GI endoscopy was                        accomplished  without difficulty. The patient tolerated                        the procedure well. Findings:      The duodenal bulb and second portion of the duodenum were normal.      A small hiatal hernia was present.      The entire examined stomach was normal. Biopsies were taken with a cold       forceps for Helicobacter pylori testing.      The cardia and gastric fundus were normal on retroflexion.      The gastroesophageal junction and examined esophagus were normal. Impression:           - Normal duodenal bulb and second portion of the                        duodenum.                       - Small hiatal hernia.                        - Normal stomach. Biopsied.                       - Normal gastroesophageal junction and esophagus. Recommendation:       - Await pathology results.                       - Discharge patient to home.                       - Resume previous diet.                       - Continue present medications.                       - Use a proton pump inhibitor PO BID for 3 months.                       - Follow an antireflux regimen. Procedure Code(s):    --- Professional ---                       [phone], Esophagogastroduodenoscopy, flexible, transoral;                        with biopsy, single or multiple Diagnosis Code(s):    --- Professional ---                       K44.9, Diaphragmatic hernia without obstruction or                        gangrene                       R10.13, Epigastric pain                       R12, Heartburn CPT copyright 2017 American Medical Association. All rights reserved. The codes documented in this report are preliminary and upon coder review may  be revised to meet current compliance requirements. Dr. Ulyess Mort Lin Landsman MD, MD 06/21/2017 1:48:15  PM This report has been signed electronically. Number of Addenda: 0 Note Initiated On: 06/21/2017 12:58 PM      [facility]

## 2017-06-21 NOTE — H&P (Signed)
Cephas Darby, MD [address]  [place]  [place], [place] [address]  Main: [phone]  Fax: [phone] Pager: [phone]  Primary Care Physician:  Patient, No Pcp Per Primary Gastroenterologist:  Dr. Cephas Darby  Pre-Procedure History & Physical: HPI:  Tonya Paul is a 42 y.o. female is here for an endoscopy.   Past Medical History:  Diagnosis Date  . Diabetes mellitus without complication ([place])   . Hemorrhoids 11/18/2015  . Hypertension   . Left breast mass 09/01/2015  . 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 Palpitations 02/03/2016   Palpitations  . Prolonged QT syndrome   . Vertigo     Past Surgical History:  Procedure Laterality Date  . BREAST BIOPSY Left 09/03/2015   [place] Core bx, benign    Prior to Admission medications   Medication Sig Start Date End Date Taking? Authorizing Provider  Blood Glucose Monitoring Suppl (TRUE METRIX METER) DEVI 1 each by Does not apply route 3 (three) times daily before meals. 04/16/15  Yes Newlin, Charlane Ferretti, MD  glucose blood (RELION GLUCOSE TEST STRIPS) test strip Use as instructed 07/30/16  Yes Hairston, Mandesia R, FNP  insulin glargine (LANTUS) 100 UNIT/ML injection INJECT (24 UNITS TOTAL) INTO THE SKIN DAILY. Patient taking differently: Inject 25 Units into the skin at bedtime.  04/05/17  Yes Alfonse Spruce, FNP  Insulin Syringe-Needle U-100 (B-D INS SYRINGE 0.5CC/31GX5/16) 31G X 5/16" 0.5 ML MISC USE AS DIRECTED DAILY FOR INSULIN ADMINISTRATION 11/29/16  Yes Hairston, Mandesia R, FNP  metFORMIN (GLUCOPHAGE-XR) 500 MG 24 hr tablet Take 2 tablets (1,000 mg total) by mouth 2 (two) times daily. 04/05/17  Yes Hairston, Maylon Peppers, FNP  propranolol ER (INDERAL LA) 80 MG 24 hr capsule Take 1 capsule (80 mg total) by mouth daily. 04/05/17  Yes Hairston, Mandesia R, FNP  LORazepam (ATIVAN) 0.5 MG tablet Take 1 tablet (0.5 mg total) by mouth every 8 (eight) hours as needed for anxiety. Patient not taking: Reported  on 06/21/2017 12/23/16 12/23/17  Earleen Newport, MD  omeprazole (PRILOSEC) 20 MG capsule Take 1 capsule (20 mg total) by mouth daily. Patient not taking: Reported on 06/21/2017 11/18/15   Charlott Rakes, MD  promethazine (PHENERGAN) 12.5 MG tablet Take 1-2 tablets (12.5-25 mg total) every 6 (six) hours as needed by mouth for nausea or vomiting. Patient not taking: Reported on 06/21/2017 02/01/17   Eula Listen, MD    Allergies as of 06/15/2017 - Review Complete 06/15/2017  Allergen Reaction Noted  . Ace inhibitors Shortness Of Breath 01/27/2016  . Aspirin Anaphylaxis 01/11/2015  . Ciprofloxacin Itching 04/16/2015    Family History  Problem Relation Age of Onset  . Hypertension Mother   . Lung cancer Mother   . Throat cancer Father   . Diabetes Sister   . Lupus Maternal Grandmother   . Stroke Maternal Grandfather   . Sudden Cardiac Death Daughter   . Breast cancer Paternal Aunt     Social History   Socioeconomic History  . Marital status: Married    Spouse name: Not on file  . Number of children: Not on file  . Years of education: Not on file  . Highest education level: Not on file  Occupational History  . Not on file  Social Needs  . Financial resource strain: Not on file  . Food insecurity:    Worry: Not on file    Inability: Not on file  . Transportation needs:    Medical:  Not on file    Non-medical: Not on file  Tobacco Use  . Smoking status: Never Smoker  . Smokeless tobacco: Never Used  Substance and Sexual Activity  . Alcohol use: No  . Drug use: No  . Sexual activity: Yes  Lifestyle  . Physical activity:    Days per week: Not on file    Minutes per session: Not on file  . Stress: Not on file  Relationships  . Social connections:    Talks on phone: Not on file    Gets together: Not on file    Attends religious service: Not on file    Active member of club or organization: Not on file    Attends meetings of clubs or organizations: Not on file      Relationship status: Not on file  . Intimate partner violence:    Fear of current or ex partner: Not on file    Emotionally abused: Not on file    Physically abused: Not on file    Forced sexual activity: Not on file  Other Topics Concern  . Not on file  Social History Narrative  . Not on file    Review of Systems: See HPI, otherwise negative ROS  Physical Exam: BP (!) 129/92   Pulse 81   Temp (!) 97.3 F (36.3 C) (Tympanic)   Resp 20   Ht 5\' 11"  (1.803 m)   Wt 210 lb (95.3 kg)   LMP 06/05/2017 (Approximate)   BMI 29.29 kg/m  General:   Alert,  pleasant and cooperative in NAD Head:  Normocephalic and atraumatic. Neck:  Supple; no masses or thyromegaly. Lungs:  Clear throughout to auscultation.    Heart:  Regular rate and rhythm. Abdomen:  Soft, nontender and nondistended. Normal bowel sounds, without guarding, and without rebound.   Neurologic:  Alert and  oriented x4;  grossly normal neurologically.  Impression/Plan: Tonya Paul is here for an endoscopy to be performed for epigastric pain  Risks, benefits, limitations, and alternatives regarding  endoscopy have been reviewed with the patient.  Questions have been answered.  All parties agreeable.   Sherri Sear, MD  06/21/2017, 1:26 PM

## 2017-06-22 ENCOUNTER — Telehealth: Payer: Self-pay | Admitting: Gastroenterology

## 2017-06-22 NOTE — Telephone Encounter (Signed)
Patient has questions about her discharge instructions for medication, Please call patient.

## 2017-06-22 NOTE — Telephone Encounter (Signed)
Patient has been informed that PPI is the omeprazole 40 mg BID that she should continue for the next 3 months.  She has been advised to drink warm salt water and plenty of fluids to alleviate scratchy throat.  She also had concerns about epigastric pain.  I explained to her that biopsies were taken and some times this can cause some discomfort, but it should go away in a few days.  Thanks [employer]

## 2017-06-24 ENCOUNTER — Encounter: Payer: Self-pay | Admitting: Gastroenterology

## 2017-06-24 LAB — SURGICAL PATHOLOGY

## 2017-06-29 ENCOUNTER — Telehealth: Payer: Self-pay | Admitting: Cardiovascular Disease

## 2017-06-29 NOTE — Telephone Encounter (Signed)
New Message:    Please call, ptt wants to talk to you about her taking Omeprazole.

## 2017-06-29 NOTE — Telephone Encounter (Signed)
Returned call to patient she stated she was prescribed omeprazole by GI Dr.to take for 3 months.Stated on website for patients who have long QT syndrome advised not to take omeprazole.Advised to call GI Dr.to prescribe a different PPI that she can take.

## 2017-06-30 ENCOUNTER — Telehealth: Payer: Self-pay | Admitting: Gastroenterology

## 2017-06-30 NOTE — Telephone Encounter (Signed)
Pt had endoscopy and was perscriped rx omeprazole she can not take this rx it is on list for her not to take this  Pt  Would like call ASAP please to get different rx

## 2017-06-30 NOTE — Telephone Encounter (Signed)
Patient stated that she can not take omeprazole due to long QT Syndrome this has been documented in a note from cardiologist.  Please advise on prescribing something different. Thanks [employer]

## 2017-07-13 ENCOUNTER — Encounter: Payer: Self-pay | Admitting: Nurse Practitioner

## 2017-07-13 ENCOUNTER — Ambulatory Visit: Payer: Medicaid Other | Attending: Nurse Practitioner | Admitting: Nurse Practitioner

## 2017-07-13 VITALS — BP 131/85 | HR 90 | Temp 98.9°F | Ht 71.0 in | Wt 213.6 lb

## 2017-07-13 DIAGNOSIS — K219 Gastro-esophageal reflux disease without esophagitis: Secondary | ICD-10-CM | POA: Diagnosis not present

## 2017-07-13 DIAGNOSIS — Z794 Long term (current) use of insulin: Secondary | ICD-10-CM | POA: Insufficient documentation

## 2017-07-13 DIAGNOSIS — Z886 Allergy status to analgesic agent status: Secondary | ICD-10-CM | POA: Insufficient documentation

## 2017-07-13 DIAGNOSIS — Z79899 Other long term (current) drug therapy: Secondary | ICD-10-CM | POA: Insufficient documentation

## 2017-07-13 DIAGNOSIS — E11649 Type 2 diabetes mellitus with hypoglycemia without coma: Secondary | ICD-10-CM | POA: Insufficient documentation

## 2017-07-13 DIAGNOSIS — Z881 Allergy status to other antibiotic agents status: Secondary | ICD-10-CM | POA: Diagnosis not present

## 2017-07-13 DIAGNOSIS — I10 Essential (primary) hypertension: Secondary | ICD-10-CM | POA: Diagnosis not present

## 2017-07-13 DIAGNOSIS — E1165 Type 2 diabetes mellitus with hyperglycemia: Secondary | ICD-10-CM | POA: Diagnosis not present

## 2017-07-13 LAB — POCT GLYCOSYLATED HEMOGLOBIN (HGB A1C): Hemoglobin A1C: 7.7

## 2017-07-13 LAB — GLUCOSE, POCT (MANUAL RESULT ENTRY): POC Glucose: 122 mg/dl — AB (ref 70–99)

## 2017-07-13 MED ORDER — INSULIN PEN NEEDLE 31G X 5 MM MISC: 1 refills | Status: DC

## 2017-07-13 MED ORDER — PROPRANOLOL HCL ER 80 MG PO CP24: 80.0000 mg | ORAL_CAPSULE | Freq: Every day | ORAL | 1 refills | Status: DC

## 2017-07-13 MED ORDER — INSULIN GLARGINE 100 UNIT/ML ~~LOC~~ SOLN: 25.0000 [IU] | Freq: Every day | SUBCUTANEOUS | 3 refills | Status: DC

## 2017-07-13 MED ORDER — INSULIN GLARGINE 100 UNITS/ML SOLOSTAR PEN: 25.0000 [IU] | PEN_INJECTOR | Freq: Every day | SUBCUTANEOUS | 5 refills | Status: DC

## 2017-07-13 MED FILL — LANTUS SOLOSTAR 100 UNITS/M: 100 | 24 days supply | Qty: 6 | Fill #0

## 2017-07-13 NOTE — Addendum Note (Signed)
Addended by: Geryl Rankins on: 07/13/2017 04:49 PM   Modules accepted: Orders

## 2017-07-13 NOTE — Patient Instructions (Signed)
Coping With Diabetes Diabetes (type 1 diabetes mellitus or type 2 diabetes mellitus) is a condition in which the body does not have enough of a hormone called insulin, or the body does not respond properly to insulin. Normally, insulin allows sugars (glucose) to enter cells in the body. The cells use glucose for energy. With diabetes, extra glucose builds up in the blood instead of going into cells, which results in high blood glucose (hyperglycemia). How to manage lifestyle changes Managing diabetes includes medical treatments as well as lifestyle changes. If diabetes is not managed well, serious physical and emotional complications can occur. Taking good care of yourself means that you are responsible for:  Monitoring glucose regularly.  Eating a healthy diet.  Exercising regularly.  Meeting with health care providers.  Taking medicines as directed.  Some people may feel a lot of stress about managing their diabetes. This is known as emotional distress, and it is very common. Living with diabetes can place you at risk for emotional distress, depression, or anxiety. These disorders can be confusing and can make diabetes management more difficult. How to recognize stress Emotional distress Symptoms of emotional distress include:  Anger about having a diagnosis of diabetes.  Fear or frustration about your diagnosis and the changes you need to make to manage the condition.  Being overly worried about the care that you need or the cost of the care you need.  Feeling like you caused your condition by doing something wrong.  Fear of unpredictable situations, like low or high blood glucose.  Feeling judged by your health care providers.  Feeling very alone with the disease.  Getting too tired or "burned out" with the demands of daily care.  Depression Having diabetes means that you are at a higher risk for depression. Having depression also means that you are at a higher risk for  diabetes. Your health care provider may test (screen) you for symptoms of depression. It is important to recognize depression symptoms and to start treatment for it soon after it is diagnosed. The following are some symptoms of depression:  Loss of interest in things that you used to enjoy.  Trouble sleeping, or often waking up early and not being able to get back to sleep.  A change in appetite.  Feeling tired most of the day.  Feeling nervous and anxious.  Feeling guilty and worrying that you are a burden to others.  Feeling depressed more often than you do not feel that way.  Thoughts of hurting yourself or feeling that you want to die.  If you have any of these symptoms for 2 weeks or longer, reach out to a health care provider. Where to find support  Ask your health care provider to recommend a therapist who understands both depression and diabetes.  Search for information and support from the American Diabetes Association: www.diabetes.org  Find a certified diabetes educator and make an appointment through [facility] Diabetes Educators: www.diabeteseducator.org Follow these instructions at home: Managing emotional distress The following are some ways to manage emotional distress:  Talk with your health care provider or certified diabetes educator. Consider working with a counselor or therapist.  Learn as much as you can about diabetes and its treatment. Meet with a certified diabetes educator or take a class to learn how to manage your condition.  Keep a journal of your thoughts and concerns.  Accept that some things are out of your control.  Talk with other people who have diabetes. It  can help to talk with others about the emotional distress that you feel.  Find ways to manage stress that work for you. These may include art or music therapy, exercise, meditation, and hobbies.  Seek support from spiritual leaders, family, and friends.  General  instructions  Follow your diabetes management plan.  Keep all follow-up visits as told by your health care provider. This is important. Get help right away if:  You have thoughts about hurting yourself or others. If you ever feel like you may hurt yourself or others, or have thoughts about taking your own life, get help right away. You can go to your nearest emergency department or call:  Your local emergency services (911 in the [place]  A suicide crisis helpline, such as the [facility] at 2503124528. This is open 24 hours a day.  Summary  Diabetes (type 1 diabetes mellitus or type 2 diabetes mellitus) is a condition in which the body does not have enough of a hormone called insulin, or the body does not respond properly to insulin.  Living with diabetes puts you at risk for medical issues, and it also puts you at risk for emotional issues such as emotional distress, depression, and anxiety.  Recognizing the symptoms of emotional distress and depression may help you avoid problems with your diabetes control. It is important to start treatment for emotional distress and depression soon after they are diagnosed.  Having diabetes means that you are at a higher risk for depression. Ask your health care provider to recommend a therapist who understands both depression and diabetes.  If you experience symptoms of emotional distress or depression, it is important to discuss this with your health care provider, certified diabetes educator, or therapist. This information is not intended to replace advice given to you by your health care provider. Make sure you discuss any questions you have with your health care provider. Document Released: 07/22/2016 Document Revised: 07/22/2016 Document Reviewed: 07/22/2016 Elsevier Interactive Patient Education  2018 [employer].  Diabetes Mellitus and Nutrition When you have diabetes (diabetes mellitus), it is very important  to have healthy eating habits because your blood sugar (glucose) levels are greatly affected by what you eat and drink. Eating healthy foods in the appropriate amounts, at about the same times every day, can help you:  Control your blood glucose.  Lower your risk of heart disease.  Improve your blood pressure.  Reach or maintain a healthy weight.  Every person with diabetes is different, and each person has different needs for a meal plan. Your health care provider may recommend that you work with a diet and nutrition specialist (dietitian) to make a meal plan that is best for you. Your meal plan may vary depending on factors such as:  The calories you need.  The medicines you take.  Your weight.  Your blood glucose, blood pressure, and cholesterol levels.  Your activity level.  Other health conditions you have, such as heart or kidney disease.  How do carbohydrates affect me? Carbohydrates affect your blood glucose level more than any other type of food. Eating carbohydrates naturally increases the amount of glucose in your blood. Carbohydrate counting is a method for keeping track of how many carbohydrates you eat. Counting carbohydrates is important to keep your blood glucose at a healthy level, especially if you use insulin or take certain oral diabetes medicines. It is important to know how many carbohydrates you can safely have in each meal. This is different for  every person. Your dietitian can help you calculate how many carbohydrates you should have at each meal and for snack. Foods that contain carbohydrates include:  Bread, cereal, rice, pasta, and crackers.  Potatoes and corn.  Peas, beans, and lentils.  Milk and yogurt.  Fruit and juice.  Desserts, such as cakes, cookies, ice cream, and candy.  How does alcohol affect me? Alcohol can cause a sudden decrease in blood glucose (hypoglycemia), especially if you use insulin or take certain oral diabetes medicines.  Hypoglycemia can be a life-threatening condition. Symptoms of hypoglycemia (sleepiness, dizziness, and confusion) are similar to symptoms of having too much alcohol. If your health care provider says that alcohol is safe for you, follow these guidelines:  Limit alcohol intake to no more than 1 drink per day for nonpregnant women and 2 drinks per day for men. One drink equals 12 oz of beer, 5 oz of wine, or 1 oz of hard liquor.  Do not drink on an empty stomach.  Keep yourself hydrated with water, diet soda, or unsweetened iced tea.  Keep in mind that regular soda, juice, and other mixers may contain a lot of sugar and must be counted as carbohydrates.  What are tips for following this plan? Reading food labels  Start by checking the serving size on the label. The amount of calories, carbohydrates, fats, and other nutrients listed on the label are based on one serving of the food. Many foods contain more than one serving per package.  Check the total grams (g) of carbohydrates in one serving. You can calculate the number of servings of carbohydrates in one serving by dividing the total carbohydrates by 15. For example, if a food has 30 g of total carbohydrates, it would be equal to 2 servings of carbohydrates.  Check the number of grams (g) of saturated and trans fats in one serving. Choose foods that have low or no amount of these fats.  Check the number of milligrams (mg) of sodium in one serving. Most people should limit total sodium intake to less than 2,300 mg per day.  Always check the nutrition information of foods labeled as "low-fat" or "nonfat". These foods may be higher in added sugar or refined carbohydrates and should be avoided.  Talk to your dietitian to identify your daily goals for nutrients listed on the label. Shopping  Avoid buying canned, premade, or processed foods. These foods tend to be high in fat, sodium, and added sugar.  Shop around the outside edge of the  grocery store. This includes fresh fruits and vegetables, bulk grains, fresh meats, and fresh dairy. Cooking  Use low-heat cooking methods, such as baking, instead of high-heat cooking methods like deep frying.  Cook using healthy oils, such as olive, canola, or sunflower oil.  Avoid cooking with butter, cream, or high-fat meats. Meal planning  Eat meals and snacks regularly, preferably at the same times every day. Avoid going long periods of time without eating.  Eat foods high in fiber, such as fresh fruits, vegetables, beans, and whole grains. Talk to your dietitian about how many servings of carbohydrates you can eat at each meal.  Eat 4-6 ounces of lean protein each day, such as lean meat, chicken, fish, eggs, or tofu. 1 ounce is equal to 1 ounce of meat, chicken, or fish, 1 egg, or 1/4 cup of tofu.  Eat some foods each day that contain healthy fats, such as avocado, nuts, seeds, and fish. Lifestyle   Check your blood  glucose regularly.  Exercise at least 30 minutes 5 or more days each week, or as told by your health care provider.  Take medicines as told by your health care provider.  Do not use any products that contain nicotine or tobacco, such as cigarettes and e-cigarettes. If you need help quitting, ask your health care provider.  Work with a counselor or diabetes educator to identify strategies to manage stress and any emotional and social challenges. What are some questions to ask my health care provider?  Do I need to meet with a diabetes educator?  Do I need to meet with a dietitian?  What number can I call if I have questions?  When are the best times to check my blood glucose? Where to find more information:  American Diabetes Association: diabetes.org/food-and-fitness/food  Academy of Nutrition and Dietetics: www.eatright.org/resources/health/diseases-and-conditions/diabetes  National Institute of Diabetes and Digestive and Kidney Diseases (NIH):  www.niddk.nih.gov/health-information/diabetes/overview/diet-eating-physical-activity Summary  A healthy meal plan will help you control your blood glucose and maintain a healthy lifestyle.  Working with a diet and nutrition specialist (dietitian) can help you make a meal plan that is best for you.  Keep in mind that carbohydrates and alcohol have immediate effects on your blood glucose levels. It is important to count carbohydrates and to use alcohol carefully. This information is not intended to replace advice given to you by your health care provider. Make sure you discuss any questions you have with your health care provider. Document Released: 12/03/2004 Document Revised: 04/12/2016 Document Reviewed: 04/12/2016 Elsevier Interactive Patient Education  2018 Elsevier Inc.  

## 2017-07-13 NOTE — Progress Notes (Signed)
Assessment & Plan:  Tonya Paul was seen today for establish care.  Diagnoses and all orders for this visit:  Uncontrolled type 2 diabetes mellitus with hyperglycemia (HCC) -     Glucose (CBG) -     HgB A1c -     insulin glargine (LANTUS) 100 UNIT/ML injection; Inject 0.25 mLs (25 Units total) into the skin at bedtime. Continue blood sugar control as discussed in office today, low carbohydrate diet, and regular physical exercise as tolerated, 150 minutes per week (30 min each day, 5 days per week, or 50 min 3 days per week). Keep blood sugar logs with fasting goal of 80-130 mg/dl, post prandial less than 180.  For Hypoglycemia: BS <60 and Hyperglycemia BS >400; contact the clinic ASAP. Annual eye exams and foot exams are recommended.   Gastroesophageal reflux disease, esophagitis presence not specified She is waiting to hear back from her gastroenterologist in regards to continuing omeprazole due to history of prolonged QT interval.  Essential hypertension -     propranolol ER (INDERAL LA) 80 MG 24 hr capsule; Take 1 capsule (80 mg total) by mouth daily. Continue all antihypertensives as prescribed.  Remember to bring in your blood pressure log with you for your follow up appointment.  DASH/Mediterranean Diets are healthier choices for HTN.     Patient has been counseled on age-appropriate routine health concerns for screening and prevention. These are reviewed and up-to-date. Referrals have been placed accordingly. Immunizations are up-to-date or declined.    Subjective:   Chief Complaint  Patient presents with  . Establish Care    Patient is here to establish care for diabetes.    HPI Tonya Paul 42 y.o. female presents to office today to establish care for diabetes mellitus, HTN and GERD.  DM Type 2 Checking blood sugars BID. Fasting: 130-140; postprandial 160s.  She is diet compliant (most of the time)  Taking metformin 1000 mg twice daily as well and is Lantus 25 units at  bedtime.  She denies any symptoms of hypo-or hyperglycemia.  She is due for an eye exam in the next few months.  She endorses compliance with ophthalmology as well as podiatry. Lab Results  Component Value Date   HGBA1C 7.7 07/13/2017     Essential Hypertension Chronic. Takes propanol ER as prescribed. She checks her blood pressure at home. Reports normal readings.  Normal blood pressure readings today in the office. Denies chest pain, shortness of breath, palpitations, lightheadedness, dizziness, headaches or BLE edema.  BP Readings from Last 3 Encounters:  07/13/17 131/85  06/21/17 (!) 103/56  06/20/17 (!) 143/93    GERD Seeing GI. Last appt was 06-20-2017.  Recently had EGD performed with biopsies obtained. She has a hiatal hernia. Not taking omeprazole 40mg  BID due to risk of prolonged QT and she has a history of prolonged QT.  She reports her EGD symptoms actually improved and are controlled with OTC TUMS.    Anxiety Chronic. She has social phobia (large crowds). Anxiety levels are also increased with driving on highways.  She was prescribed Atarax by previous PCP however she reports Atarax is on the list of medications that are contraindicated with her history of prolonged QTC syndrome so she did not start the medication. Currently reports symptoms of anxiety are infrequent and rarely occurring. She has also been on ativan in the past.   Review of Systems  Constitutional: Negative for fever, malaise/fatigue and weight loss.  HENT: Negative.  Negative for nosebleeds.   Eyes:  Negative.  Negative for blurred vision, double vision and photophobia.  Respiratory: Negative.  Negative for cough and shortness of breath.   Cardiovascular: Negative.  Negative for chest pain, palpitations and leg swelling.  Gastrointestinal: Positive for heartburn. Negative for nausea and vomiting.  Musculoskeletal: Negative.  Negative for myalgias.  Neurological: Positive for dizziness (history of vertigo).  Negative for focal weakness, seizures and headaches.  Psychiatric/Behavioral: Negative for suicidal ideas. The patient is nervous/anxious.     Past Medical History:  Diagnosis Date  . Diabetes mellitus without complication ([place])   . Hemorrhoids 11/18/2015  . Hypertension   . Left breast mass 09/01/2015  . 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 Palpitations 02/03/2016   Palpitations  . Prolonged QT syndrome   . Vertigo     Past Surgical History:  Procedure Laterality Date  . BREAST BIOPSY Left 09/03/2015   [place] Core bx, benign  . ESOPHAGOGASTRODUODENOSCOPY (EGD) WITH PROPOFOL N/A 06/21/2017   Procedure: ESOPHAGOGASTRODUODENOSCOPY (EGD) WITH PROPOFOL;  Surgeon: Lin Landsman, MD;  Location: [facility];  Service: Gastroenterology;  Laterality: N/A;    Family History  Problem Relation Age of Onset  . Hypertension Mother   . Lung cancer Mother   . Throat cancer Father   . Diabetes Sister   . Lupus Maternal Grandmother   . Stroke Maternal Grandfather   . Sudden Cardiac Death Daughter   . Breast cancer Paternal Aunt     Social History Reviewed with no changes to be made today.   Outpatient Medications Prior to Visit  Medication Sig Dispense Refill  . Blood Glucose Monitoring Suppl (TRUE METRIX METER) DEVI 1 each by Does not apply route 3 (three) times daily before meals. 1 Device 0  . glucose blood (RELION GLUCOSE TEST STRIPS) test strip Use as instructed 100 each 12  . Insulin Syringe-Needle U-100 (B-D INS SYRINGE 0.5CC/31GX5/16) 31G X 5/16" 0.5 ML MISC USE AS DIRECTED DAILY FOR INSULIN ADMINISTRATION 100 each 1  . metFORMIN (GLUCOPHAGE-XR) 500 MG 24 hr tablet Take 2 tablets (1,000 mg total) by mouth 2 (two) times daily. 360 tablet 1  . insulin glargine (LANTUS) 100 UNIT/ML injection INJECT (24 UNITS TOTAL) INTO THE SKIN DAILY. (Patient taking differently: Inject 25 Units into the skin at bedtime. ) 10 mL 11  . propranolol ER (INDERAL LA) 80 MG 24 hr capsule  Take 1 capsule (80 mg total) by mouth daily. 30 capsule 2  . omeprazole (PRILOSEC) 40 MG capsule Take 1 capsule (40 mg total) by mouth 2 (two) times daily before a meal. (Patient not taking: Reported on 07/13/2017) 180 capsule 0  . promethazine (PHENERGAN) 12.5 MG tablet Take 1-2 tablets (12.5-25 mg total) every 6 (six) hours as needed by mouth for nausea or vomiting. (Patient not taking: Reported on 06/21/2017) 20 tablet 0  . LORazepam (ATIVAN) 0.5 MG tablet Take 1 tablet (0.5 mg total) by mouth every 8 (eight) hours as needed for anxiety. (Patient not taking: Reported on 06/21/2017) 30 tablet 0   No facility-administered medications prior to visit.     Allergies  Allergen Reactions  . Ace Inhibitors Shortness Of Breath    Reported by the patient after a trial of lisinopril prescribes by PCP  . Aspirin Anaphylaxis  . Ciprofloxacin Itching       Objective:    BP 131/85 (BP Location: Right Arm, Patient Position: Sitting, Cuff Size: Large)   Pulse 90   Temp 98.9 F (37.2 C) (Oral)   Ht  5\' 11"  (1.803 m)   Wt 213 lb 9.6 oz (96.9 kg)   SpO2 96%   BMI 29.79 kg/m  Wt Readings from Last 3 Encounters:  07/13/17 213 lb 9.6 oz (96.9 kg)  06/21/17 210 lb (95.3 kg)  06/20/17 212 lb 6.4 oz (96.3 kg)    Physical Exam  Constitutional: She is oriented to person, place, and time. She appears well-developed and well-nourished. She is cooperative.  HENT:  Head: Normocephalic and atraumatic.  Eyes: EOM are normal.  Neck: Normal range of motion.  Cardiovascular: Normal rate, regular rhythm and normal heart sounds. Exam reveals no gallop and no friction rub.  No murmur heard. Pulmonary/Chest: Effort normal and breath sounds normal. No tachypnea. No respiratory distress. She has no decreased breath sounds. She has no wheezes. She has no rhonchi. She has no rales. She exhibits no tenderness.  Abdominal: Bowel sounds are normal.  Musculoskeletal: Normal range of motion. She exhibits no edema.    Neurological: She is alert and oriented to person, place, and time. Coordination normal.  Skin: Skin is warm and dry.  Psychiatric: She has a normal mood and affect. Her behavior is normal. Judgment and thought content normal.  Nursing note and vitals reviewed.      Patient has been counseled extensively about nutrition and exercise as well as the importance of adherence with medications and regular follow-up. The patient was given clear instructions to go to ER or return to medical center if symptoms don't improve, worsen or new problems develop. The patient verbalized understanding.   Follow-up: No follow-ups on file.   Gildardo Pounds, FNP-BC [facility] [place], [place]   07/13/2017, 2:57 PM

## 2017-07-14 MED FILL — TRUEPLUS PEN NDL 31GX3/16: 31G X 5 MM | 30 days supply | Qty: 100 | Fill #0

## 2017-07-14 MED FILL — TRUEPLUS PEN NDL 31GX3/16": 31G X 5 MM | 30 days supply | Qty: 100 | Fill #0

## 2017-07-15 ENCOUNTER — Encounter: Payer: Self-pay | Admitting: Gastroenterology

## 2017-07-19 MED FILL — PROPRANOLOL ER 80 MG CAP: 80 | 30 days supply | Qty: 30 | Fill #0

## 2017-07-22 ENCOUNTER — Ambulatory Visit: Payer: Medicaid Other | Admitting: Physician Assistant

## 2017-07-22 ENCOUNTER — Encounter: Payer: Self-pay | Admitting: Physician Assistant

## 2017-07-22 NOTE — Progress Notes (Deleted)
Cardiology Office Note   Date:  07/22/2017   ID:  Alexzandrea Normington, DOB [birth year], MRN [id number]  PCP:  Gildardo Pounds, NP  Cardiologist: Dr. Gwenlyn Found, 01/14/2017 Rosaria Ferries, PA-C   No chief complaint on file.   History of Present Illness: Tonya Paul is a 42 y.o. female with a history of long QT syndrome on beta-blocker, palpitations>> PVCs on event monitor, does not tolerate propranolol at more than 80 mg daily, FH SCD, DM, HTN.   [name] presents for ***   Past Medical History:  Diagnosis Date  . Diabetes mellitus without complication ([place])   . Hemorrhoids 11/18/2015  . Hypertension   . Left breast mass 09/01/2015  . 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 Palpitations 02/03/2016   Palpitations  . Prolonged QT syndrome   . Vertigo     Past Surgical History:  Procedure Laterality Date  . BREAST BIOPSY Left 09/03/2015   [place] Core bx, benign  . ESOPHAGOGASTRODUODENOSCOPY (EGD) WITH PROPOFOL N/A 06/21/2017   Procedure: ESOPHAGOGASTRODUODENOSCOPY (EGD) WITH PROPOFOL;  Surgeon: Lin Landsman, MD;  Location: [facility];  Service: Gastroenterology;  Laterality: N/A;    Current Outpatient Medications  Medication Sig Dispense Refill  . Blood Glucose Monitoring Suppl (TRUE METRIX METER) DEVI 1 each by Does not apply route 3 (three) times daily before meals. 1 Device 0  . glucose blood (RELION GLUCOSE TEST STRIPS) test strip Use as instructed 100 each 12  . insulin glargine (LANTUS) 100 unit/mL SOPN Inject 0.25 mLs (25 Units total) into the skin at bedtime. 15 mL 5  . Insulin Pen Needle (B-D UF III MINI PEN NEEDLES) 31G X 5 MM MISC Use as instructed 100 each 1  . Insulin Syringe-Needle U-100 (B-D INS SYRINGE 0.5CC/31GX5/16) 31G X 5/16" 0.5 ML MISC USE AS DIRECTED DAILY FOR INSULIN ADMINISTRATION 100 each 1  . metFORMIN (GLUCOPHAGE-XR) 500 MG 24 hr tablet Take 2 tablets (1,000 mg total) by mouth 2 (two) times daily. 360 tablet 1  . omeprazole  (PRILOSEC) 40 MG capsule Take 1 capsule (40 mg total) by mouth 2 (two) times daily before a meal. (Patient not taking: Reported on 07/13/2017) 180 capsule 0  . promethazine (PHENERGAN) 12.5 MG tablet Take 1-2 tablets (12.5-25 mg total) every 6 (six) hours as needed by mouth for nausea or vomiting. (Patient not taking: Reported on 06/21/2017) 20 tablet 0  . propranolol ER (INDERAL LA) 80 MG 24 hr capsule Take 1 capsule (80 mg total) by mouth daily. 90 capsule 1   No current facility-administered medications for this visit.     Allergies:   Ace inhibitors; Aspirin; and Ciprofloxacin    Social History:  The patient  reports that she has never smoked. She has never used smokeless tobacco. She reports that she does not drink alcohol or use drugs.   Family History:  The patient's family history includes Breast cancer in her paternal aunt; Diabetes in her sister; Hypertension in her mother; Lung cancer in her mother; Lupus in her maternal grandmother; Stroke in her maternal grandfather; Sudden Cardiac Death in her daughter; Throat cancer in her father.    ROS:  Please see the history of present illness. All other systems are reviewed and negative.    PHYSICAL EXAM: VS:  There were no vitals taken for this visit. , BMI There is no height or weight on file to calculate BMI. GEN: Well nourished, well developed, female in no acute distress  HEENT: normal for  age  Neck: no JVD, no carotid bruit, no masses Cardiac: RRR; no murmur, no rubs, or gallops Respiratory:  clear to auscultation bilaterally, normal work of breathing GI: soft, nontender, nondistended, + BS MS: no deformity or atrophy; no edema; distal pulses are 2+ in all 4 extremities   Skin: warm and dry, no rash Neuro:  Strength and sensation are intact Psych: euthymic mood, full affect   EKG:  EKG {ACTION; IS/IS YTK:[id number]} ordered today. The ekg ordered today demonstrates ***   Recent Labs: 06/12/2017: ALT 25; BUN 12; Creatinine, Ser  0.82; Hemoglobin 11.6; Platelets 438; Potassium 3.8; Sodium 135    Lipid Panel    Component Value Date/Time   CHOL 161 07/15/2016 1515   TRIG 114 07/15/2016 1515   HDL 40 07/15/2016 1515   CHOLHDL 4.0 07/15/2016 1515   CHOLHDL 3.6 04/18/2015 0936   VLDL 13 04/18/2015 0936   LDLCALC 98 07/15/2016 1515     Wt Readings from Last 3 Encounters:  07/13/17 213 lb 9.6 oz (96.9 kg)  06/21/17 210 lb (95.3 kg)  06/20/17 212 lb 6.4 oz (96.3 kg)     Other studies Reviewed: Additional studies/ records that were reviewed today include: ***.  ASSESSMENT AND PLAN:  1.  ***   Current medicines are reviewed at length with the patient today.  The patient {ACTIONS; HAS/DOES NOT HAVE:19233} concerns regarding medicines.  The following changes have been made:  {PLAN; NO CHANGE:13088:s}  Labs/ tests ordered today include: *** No orders of the defined types were placed in this encounter.    Disposition:   FU with Dr. Gwenlyn Found  Signed, Rosaria Ferries, PA-C  07/22/2017 7:45 AM    [place] Phone: [phone]; Fax: [phone]  This note was written with the assistance of speech recognition software. Please excuse any transcriptional errors.

## 2017-07-25 ENCOUNTER — Encounter: Payer: Self-pay | Admitting: *Deleted

## 2017-08-02 ENCOUNTER — Ambulatory Visit: Payer: Medicaid Other | Admitting: Gastroenterology

## 2017-08-12 ENCOUNTER — Telehealth: Payer: Self-pay

## 2017-08-12 NOTE — Telephone Encounter (Signed)
A fax from [facility] request to fill: Rosuvastatin Calcium 10mg  tablet, QTY: 90   Last filled: 04/25/2017

## 2017-08-15 ENCOUNTER — Other Ambulatory Visit: Payer: Self-pay | Admitting: Nurse Practitioner

## 2017-08-15 MED ORDER — ROSUVASTATIN CALCIUM 10 MG PO TABS: 10.0000 mg | ORAL_TABLET | Freq: Every day | ORAL | 3 refills | Status: DC

## 2017-08-15 NOTE — Telephone Encounter (Signed)
Order has been sent

## 2017-08-22 ENCOUNTER — Other Ambulatory Visit: Payer: Self-pay

## 2017-08-22 ENCOUNTER — Encounter: Payer: Self-pay | Admitting: Gastroenterology

## 2017-08-22 ENCOUNTER — Ambulatory Visit: Payer: Medicaid Other | Admitting: Gastroenterology

## 2017-08-22 MED ORDER — ROSUVASTATIN CALCIUM 10 MG PO TABS: 10.0000 mg | ORAL_TABLET | Freq: Every day | ORAL | 3 refills | Status: DC

## 2017-08-23 ENCOUNTER — Encounter: Payer: Self-pay | Admitting: Nurse Practitioner

## 2017-09-02 ENCOUNTER — Encounter: Payer: Self-pay | Admitting: Nurse Practitioner

## 2017-09-02 ENCOUNTER — Ambulatory Visit: Payer: Self-pay | Attending: Nurse Practitioner | Admitting: Nurse Practitioner

## 2017-09-02 VITALS — BP 139/92 | HR 85 | Temp 98.6°F | Ht 71.0 in | Wt 212.8 lb

## 2017-09-02 DIAGNOSIS — Z79899 Other long term (current) drug therapy: Secondary | ICD-10-CM | POA: Insufficient documentation

## 2017-09-02 DIAGNOSIS — Z794 Long term (current) use of insulin: Secondary | ICD-10-CM | POA: Insufficient documentation

## 2017-09-02 DIAGNOSIS — Z886 Allergy status to analgesic agent status: Secondary | ICD-10-CM | POA: Insufficient documentation

## 2017-09-02 DIAGNOSIS — E1165 Type 2 diabetes mellitus with hyperglycemia: Secondary | ICD-10-CM | POA: Insufficient documentation

## 2017-09-02 DIAGNOSIS — Z7689 Persons encountering health services in other specified circumstances: Secondary | ICD-10-CM

## 2017-09-02 DIAGNOSIS — I10 Essential (primary) hypertension: Secondary | ICD-10-CM | POA: Insufficient documentation

## 2017-09-02 DIAGNOSIS — Z888 Allergy status to other drugs, medicaments and biological substances status: Secondary | ICD-10-CM | POA: Insufficient documentation

## 2017-09-02 DIAGNOSIS — Z8249 Family history of ischemic heart disease and other diseases of the circulatory system: Secondary | ICD-10-CM | POA: Insufficient documentation

## 2017-09-02 DIAGNOSIS — Z881 Allergy status to other antibiotic agents status: Secondary | ICD-10-CM | POA: Insufficient documentation

## 2017-09-02 MED FILL — !LANTUS SOLOSTAR 100UNITS/M: 100 | 24 days supply | Qty: 6 | Fill #1

## 2017-09-02 NOTE — Patient Instructions (Signed)
Complicated Grieving Grief is a normal response to the death of someone close to you. Feelings of fear, anger, and guilt can affect almost everyone who loses a loved one. It is also common to have symptoms of depression while you are grieving. These include problems with sleep, loss of appetite, and lack of energy. They may last for weeks or months after a loss. Complicated grief is different from normal grief or depression. Normal grieving involves sadness and feelings of loss, but these feelings are not constant. Complicated grief is a constant and severe type of grief. It interferes with your ability to function normally. It may last for several months to a year or longer. Complicated grief may require treatment from a mental health care provider. What are the causes? It is not known why some people continue to struggle with grief and others do not. You may be at higher risk for complicated grief if:  The death of your loved one was sudden or unexpected.  The death of your loved one was due to a violent event.  Your loved one committed suicide.  Your loved one was a child or a young person.  You were very close to or dependent on the loved one.  You have a history of depression.  What are the signs or symptoms? Signs and symptoms of complicated grief may include:  Feeling disbelief or numbness.  Being unable to enjoy good memories of your loved one.  Needing to avoid anything that reminds you of your loved one.  Being unable to stop thinking about the death.  Feeling intense anger or guilt.  Feeling alone and hopeless.  Feeling that your life is meaningless and empty.  Losing the desire to live.  How is this diagnosed? Your health care provider may diagnose complicated grief if:  You have constant symptoms of grief for 6-12 months or longer.  Your symptoms are interfering with your ability to live your life.  Your health care provider may want you to see a mental health  care provider. Many symptoms of depression are similar to the symptoms of complicated grief. It is important to be evaluated for complicated grief along with other mental health conditions. How is this treated? Talk therapy with a mental health provider is the most common treatment for complicated grief. During therapy, you will learn healthy ways to cope with the loss of your loved one. In some cases, your mental health care provider may also recommend antidepressant medicines. Follow these instructions at home:  Take care of yourself. ? Eat regular meals and maintain a healthy diet. Eat plenty of fruits, vegetables, and whole grains. ? Try to get some exercise each day. ? Keep regular hours for sleep. Try to get at least 8 hours of sleep each night.  Do not use drugs or alcohol to ease your symptoms.  Take medicines only as directed by your health care provider.  Spend time with friends and loved ones.  Consider joining a grief (bereavement) support group to help you deal with your loss.  Keep all follow-up visits as directed by your health care provider. This is important. Contact a health care provider if:  Your symptoms keep you from functioning normally.  Your symptoms do not get better with treatment. Get help right away if:  You have serious thoughts of hurting yourself or someone else.  You have suicidal feelings. This information is not intended to replace advice given to you by your health care provider. Make sure you   discuss any questions you have with your health care provider. Document Released: 03/08/2005 Document Revised: 08/14/2015 Document Reviewed: 08/16/2013 Elsevier Interactive Patient Education  2018 [place] With Loss, Adult People experience loss in many different ways throughout their lives. Events such as moving, changing jobs, and losing friends can create a sense of loss. The loss may be as serious as a major health change, divorce, death of  a pet, or death of a loved one. All of these types of loss are likely to create a physical and emotional reaction known as grief. Grief is the result of a major change or an absence of something or someone that you count on. Grief is a normal reaction to loss. How to recognize changes A variety of factors can affect your grieving experience, including:  The nature of your loss.  Your relationship to what or whom you lost.  Your understanding of grief and how to cope with it.  Your support system.  The way that you deal with your grief will affect your ability to function as you normally do. When you are grieving, you may experience:  Numbness, shock, sadness, anxiety, anger, denial, and guilt.  Thoughts about death.  Unexpected crying.  A physical sensation of emptiness in your gut.  Problems sleeping and eating.  Fatigue.  Loss of interest in normal activities.  Dreaming about or imagining seeing the person who died.  A need to remember what or whom you lost.  Difficulty thinking about anything other than your loss for a period of time.  Relief. If you have been expecting the loss for a while, you may feel a sense of relief when it happens.  Where to find support To get support for coping with loss:  Ask your health care provider for help and recommendations, such as grief counseling or therapy.  Think about joining a support group for people who are coping with loss.  Follow these instructions at home:  Be patient with yourself and others. Allow the grieving process to happen, and remember that grieving takes time. ? It is likely that you may never feel completely done with some grief. You may find a way to move on while still cherishing memories and feelings about your loss. ? Accepting your loss is a process. It can take months or longer to adjust.  Express your feelings in healthy ways, such as: ? Talking with others about your loss. It may be helpful to find  others who have had a similar loss, such as a support group. ? Writing down your feelings in a journal. ? Doing physical activities to release stress and emotional energy. ? Doing creative activities like painting, sculpting, or playing or listening to music. ? Practicing resilience. This is the ability to recover and adjust after facing challenges. Reading some resources that encourage resilience may help you to learn ways to practice those behaviors.  Keep to your normal routine as much as possible. If you have trouble focusing or doing normal activities, it is acceptable to take some time away from your normal routine.  Spend time with friends and loved ones.  Eat a healthy diet, get plenty of sleep, and rest when you feel tired. Where to find more information: You can find more information about coping with loss from:  American Society of Clinical Oncology: www.cancer.net  American Psychological Association: TVStereos.ch  Contact a health care provider if:  Your grief is extreme and keeps getting worse.  You have ongoing grief  that does not improve.  Your body shows symptoms of grief, such as illness.  You feel depressed, anxious, or lonely. Get help right away if:  You have thoughts about hurting yourself or others. If you ever feel like you may hurt yourself or others, or have thoughts about taking your own life, get help right away. You can go to your nearest emergency department or call:  Your local emergency services (911 in the [place]  A suicide crisis helpline, such as the [facility] at 813-504-4538. This is open 24 hours a day.  Summary  Grief is a normal part of experiencing a loss. It is the result of a major change or an absence of something or someone that you count on.  The depth of grief and the period of recovery depend on the type of loss as well as your ability to adjust to the change and process your feelings.  Processing  grief requires patience and a willingness to accept your feelings and talk about your loss with people who are supportive.  It is important to find resources that work for you and to realize that we are all different when it comes to grief. There is not one single grieving process that works for everyone in the same way.  Be aware that when grief becomes extreme, it can lead to more severe issues like isolation, depression, anxiety, or suicidal thoughts. Talk with your health care provider if you have any of these issues. This information is not intended to replace advice given to you by your health care provider. Make sure you discuss any questions you have with your health care provider. Document Released: 07/22/2016 Document Revised: 07/22/2016 Document Reviewed: 07/22/2016 Elsevier Interactive Patient Education  2018 [employer].

## 2017-09-02 NOTE — Progress Notes (Signed)
Assessment & Plan:  Anyelin was seen today for follow-up.  Diagnoses and all orders for this visit:  Return to work evaluation  Uncontrolled type 2 diabetes mellitus with hyperglycemia ([place]) -     Glucose (CBG)    Patient has been counseled on age-appropriate routine health concerns for screening and prevention. These are reviewed and up-to-date. Referrals have been placed accordingly. Immunizations are up-to-date or declined.    Subjective:   Chief Complaint  Patient presents with  . Follow-up    Pt. stated she had an allergic reaction to [employer] insulin she purchase but she is fine now. Pt. request if she can get a note stating she can go back to work due to she was out of work for [employer].   HPI Roseland Braun 42 y.o. female presents to office today requesting a return to work note.  Patient informs me today she has been worked for about 3 weeks due to grief from her mother's death.  She reports she took some time off from work and went to see a Social worker however she was not prescribed any medication and she only had one visit with discount for.  She was supposed to follow-up with a counselor for her additional visits however she missed those appointments.  Today she reports that she is ready to go back to work and needs a note stating that she is medically stable to return to work.  I have instructed her that I will supply her with a note however the note will say that she was not placed on leave from work under my advice nor was she under my supervision or receiving any type of medical care from the while she was out of work for those 3 weeks.  She verbalized understanding.  I inquired as to why she was unable to receive a note from her therapy as she reports the therapist is with her PCP and to obtain the letter me.  I did try to contact the therapist Almyra Brace however I was unsuccessful with reaching her personally.   Review of Systems  Constitutional: Negative for  fever, malaise/fatigue and weight loss.  HENT: Negative.  Negative for nosebleeds.   Eyes: Negative.  Negative for blurred vision, double vision and photophobia.  Respiratory: Negative.  Negative for cough and shortness of breath.   Cardiovascular: Negative.  Negative for chest pain, palpitations and leg swelling.  Gastrointestinal: Negative.  Negative for heartburn, nausea and vomiting.  Musculoskeletal: Negative.  Negative for myalgias.  Neurological: Negative.  Negative for dizziness, focal weakness, seizures and headaches.  Psychiatric/Behavioral: Negative.  Negative for suicidal ideas.    Past Medical History:  Diagnosis Date  . Diabetes mellitus without complication ([place])   . Hemorrhoids 11/18/2015  . Hypertension   . Left breast mass 09/01/2015  . 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 Palpitations 02/03/2016   PVCs seen on monitor  . Vertigo     Past Surgical History:  Procedure Laterality Date  . BREAST BIOPSY Left 09/03/2015   [place] Core bx, benign  . ESOPHAGOGASTRODUODENOSCOPY (EGD) WITH PROPOFOL N/A 06/21/2017   Procedure: ESOPHAGOGASTRODUODENOSCOPY (EGD) WITH PROPOFOL;  Surgeon: Lin Landsman, MD;  Location: [facility];  Service: Gastroenterology;  Laterality: N/A;    Family History  Problem Relation Age of Onset  . Hypertension Mother   . Lung cancer Mother   . Throat cancer Father   . Diabetes Sister   . Lupus Maternal Grandmother   .  Stroke Maternal Grandfather   . Sudden Cardiac Death Daughter   . Breast cancer Paternal Aunt     Social History Reviewed with no changes to be made today.   Outpatient Medications Prior to Visit  Medication Sig Dispense Refill  . Blood Glucose Monitoring Suppl (TRUE METRIX METER) DEVI 1 each by Does not apply route 3 (three) times daily before meals. 1 Device 0  . glucose blood (RELION GLUCOSE TEST STRIPS) test strip Use as instructed 100 each 12  . Insulin Pen Needle (B-D UF III MINI PEN NEEDLES) 31G X 5  MM MISC Use as instructed 100 each 1  . Insulin Syringe-Needle U-100 (B-D INS SYRINGE 0.5CC/31GX5/16) 31G X 5/16" 0.5 ML MISC USE AS DIRECTED DAILY FOR INSULIN ADMINISTRATION 100 each 1  . metFORMIN (GLUCOPHAGE-XR) 500 MG 24 hr tablet Take 2 tablets (1,000 mg total) by mouth 2 (two) times daily. 360 tablet 1  . propranolol ER (INDERAL LA) 80 MG 24 hr capsule Take 1 capsule (80 mg total) by mouth daily. 90 capsule 1  . rosuvastatin (CRESTOR) 10 MG tablet Take 1 tablet (10 mg total) by mouth daily. 90 tablet 3  . insulin glargine (LANTUS) 100 unit/mL SOPN Inject 0.25 mLs (25 Units total) into the skin at bedtime. 15 mL 5  . omeprazole (PRILOSEC) 40 MG capsule Take 1 capsule (40 mg total) by mouth 2 (two) times daily before a meal. (Patient not taking: Reported on 07/13/2017) 180 capsule 0  . promethazine (PHENERGAN) 12.5 MG tablet Take 1-2 tablets (12.5-25 mg total) every 6 (six) hours as needed by mouth for nausea or vomiting. (Patient not taking: Reported on 06/21/2017) 20 tablet 0   No facility-administered medications prior to visit.     Allergies  Allergen Reactions  . Ace Inhibitors Shortness Of Breath    Reported by the patient after a trial of lisinopril prescribes by PCP  . Aspirin Anaphylaxis  . Ciprofloxacin Itching       Objective:    BP (!) 139/92 (BP Location: Right Arm, Patient Position: Sitting, Cuff Size: Large)   Pulse 85   Temp 98.6 F (37 C) (Oral)   Ht 5\' 11"  (1.803 m)   Wt 212 lb 12.8 oz (96.5 kg)   SpO2 98%   BMI 29.68 kg/m  Wt Readings from Last 3 Encounters:  09/02/17 212 lb 12.8 oz (96.5 kg)  07/13/17 213 lb 9.6 oz (96.9 kg)  06/21/17 210 lb (95.3 kg)    Physical Exam  Constitutional: She is oriented to person, place, and time. She appears well-developed and well-nourished. She is cooperative.  HENT:  Head: Normocephalic and atraumatic.  Eyes: EOM are normal.  Neck: Normal range of motion.  Cardiovascular: Normal rate, regular rhythm and normal heart  sounds. Exam reveals no gallop and no friction rub.  No murmur heard. Pulmonary/Chest: Effort normal and breath sounds normal. No tachypnea. No respiratory distress. She has no decreased breath sounds. She has no wheezes. She has no rhonchi. She has no rales. She exhibits no tenderness.  Abdominal: Bowel sounds are normal.  Musculoskeletal: Normal range of motion. She exhibits no edema.  Neurological: She is alert and oriented to person, place, and time. Coordination normal.  Skin: Skin is warm and dry.  Psychiatric: She has a normal mood and affect. Her speech is normal and behavior is normal. Judgment and thought content normal. Cognition and memory are normal.  Nursing note and vitals reviewed.     Patient has been counseled extensively about nutrition  and exercise as well as the importance of adherence with medications and regular follow-up. The patient was given clear instructions to go to ER or return to medical center if symptoms don't improve, worsen or new problems develop. The patient verbalized understanding.   Follow-up: Return in about 6 weeks (around 10/12/2017) for DM.   Gildardo Pounds, FNP-BC [facility], [place]   09/02/2017, 1:19 PM

## 2017-09-14 ENCOUNTER — Ambulatory Visit: Payer: Self-pay | Admitting: Nurse Practitioner

## 2017-09-25 ENCOUNTER — Other Ambulatory Visit: Payer: Self-pay | Admitting: Nurse Practitioner

## 2017-09-26 ENCOUNTER — Other Ambulatory Visit: Payer: Self-pay | Admitting: Nurse Practitioner

## 2017-09-26 MED ORDER — INSULIN GLARGINE 100 UNITS/ML SOLOSTAR PEN: 25.0000 [IU] | PEN_INJECTOR | Freq: Every day | SUBCUTANEOUS | 5 refills | Status: DC

## 2017-09-26 MED FILL — !LANTUS SOLOSTAR 100UNITS/M: 100 | 24 days supply | Qty: 6 | Fill #2

## 2017-09-28 ENCOUNTER — Other Ambulatory Visit: Payer: Self-pay

## 2017-09-28 MED ORDER — INSULIN PEN NEEDLE 31G X 5 MM MISC: 2 refills | Status: DC

## 2017-10-01 ENCOUNTER — Encounter: Payer: Self-pay | Admitting: Emergency Medicine

## 2017-10-01 ENCOUNTER — Emergency Department
Admission: EM | Admit: 2017-10-01 | Discharge: 2017-10-01 | Disposition: A | Discharge status: Home / Self Care | Payer: Self-pay | Attending: Emergency Medicine | Admitting: Emergency Medicine

## 2017-10-01 ENCOUNTER — Emergency Department: Payer: Self-pay

## 2017-10-01 ENCOUNTER — Other Ambulatory Visit: Payer: Self-pay

## 2017-10-01 DIAGNOSIS — Z794 Long term (current) use of insulin: Secondary | ICD-10-CM | POA: Insufficient documentation

## 2017-10-01 DIAGNOSIS — R079 Chest pain, unspecified: Secondary | ICD-10-CM | POA: Insufficient documentation

## 2017-10-01 DIAGNOSIS — Z79899 Other long term (current) drug therapy: Secondary | ICD-10-CM | POA: Insufficient documentation

## 2017-10-01 DIAGNOSIS — E119 Type 2 diabetes mellitus without complications: Secondary | ICD-10-CM | POA: Insufficient documentation

## 2017-10-01 DIAGNOSIS — I10 Essential (primary) hypertension: Secondary | ICD-10-CM | POA: Insufficient documentation

## 2017-10-01 LAB — COMPREHENSIVE METABOLIC PANEL
ALK PHOS: 48 U/L (ref 38–126)
ALT: 30 U/L (ref 0–44)
AST: 26 U/L (ref 15–41)
Albumin: 4.1 g/dL (ref 3.5–5.0)
Anion gap: 10 (ref 5–15)
BUN: 9 mg/dL (ref 6–20)
CALCIUM: 9.1 mg/dL (ref 8.9–10.3)
CO2: 22 mmol/L (ref 22–32)
CREATININE: 0.74 mg/dL (ref 0.44–1.00)
Chloride: 107 mmol/L (ref 98–111)
GFR calc non Af Amer: >60 mL/min (ref >60)
GLUCOSE: 152 mg/dL — AB (ref 70–99)
Potassium: 3.6 mmol/L (ref 3.5–5.1)
SODIUM: 139 mmol/L (ref 135–145)
Total Bilirubin: 0.5 mg/dL (ref 0.3–1.2)
Total Protein: 7.8 g/dL (ref 6.5–8.1)

## 2017-10-01 LAB — TROPONIN I: Troponin I: <0.03 ng/mL (ref <0.03)

## 2017-10-01 LAB — CBC
HCT: 34.3 % — ABNORMAL LOW (ref 35.0–47.0)
HEMOGLOBIN: 11.5 g/dL — AB (ref 12.0–16.0)
MCH: 27.1 pg (ref 26.0–34.0)
MCHC: 33.5 g/dL (ref 32.0–36.0)
MCV: 81.2 fL (ref 80.0–100.0)
Platelets: 442 10*3/uL — ABNORMAL HIGH (ref 150–440)
RBC: 4.22 MIL/uL (ref 3.80–5.20)
RDW: 15.8 % — ABNORMAL HIGH (ref 11.5–14.5)
WBC: 6.9 10*3/uL (ref 3.6–11.0)

## 2017-10-01 LAB — FIBRIN DERIVATIVES D-DIMER (ARMC ONLY): Fibrin derivatives D-dimer (ARMC): 247.84 ng/mL (FEU) (ref 0.00–499.00)

## 2017-10-01 NOTE — ED Provider Notes (Addendum)
[facility] Emergency Department Provider Note  Time seen: 10:18 AM  I have reviewed the triage vital signs and the nursing notes.   HISTORY  Chief Complaint Chest Pain    HPI Tonya Paul is a 42 y.o. female with a past medical history of diabetes, hypertension, prolonged QT presents to the emergency department for chest pain.  According to the patient proximal me 30 minutes prior to arrival she developed sudden acute sharp left upper chest pain radiating into her shoulder.  Patient admits that she did "panic" and felt short of breath at some point.  Denies any vomiting.  Denies abdominal pain.  States the chest pain has improved but is still minimally present.  Denies any shortness of breath currently.  Denies any pleuritic chest pain.  No leg pain or swelling.  No history of MI or blood clot previously.  Largely negative review of systems otherwise.   Past Medical History:  Diagnosis Date  . Diabetes mellitus without complication ([place])   . Hemorrhoids 11/18/2015  . Hypertension   . Left breast mass 09/01/2015  . 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 Palpitations 02/03/2016   PVCs seen on monitor  . Vertigo     Patient Active Problem List   Diagnosis Date Noted  . Abdominal pain, chronic, epigastric   . GERD (gastroesophageal reflux disease) 06/20/2017  . Long Q-T syndrome 01/14/2017  . Palpitations 02/03/2016  . Uterine fibroid 12/11/2015  . Family history of heart disease 12/03/2015  . Hemorrhoids 11/18/2015  . Left breast mass 09/01/2015  . Obesity 09/01/2015  . Type 2 diabetes mellitus, uncontrolled ([place]) 04/16/2015    Past Surgical History:  Procedure Laterality Date  . BREAST BIOPSY Left 09/03/2015   [place] Core bx, benign  . ESOPHAGOGASTRODUODENOSCOPY (EGD) WITH PROPOFOL N/A 06/21/2017   Procedure: ESOPHAGOGASTRODUODENOSCOPY (EGD) WITH PROPOFOL;  Surgeon: Lin Landsman, MD;  Location: [place];  Service:  Gastroenterology;  Laterality: N/A;    Prior to Admission medications   Medication Sig Start Date End Date Taking? Authorizing Provider  Blood Glucose Monitoring Suppl (TRUE METRIX METER) DEVI 1 each by Does not apply route 3 (three) times daily before meals. 04/16/15   Charlott Rakes, MD  glucose blood (RELION GLUCOSE TEST STRIPS) test strip Use as instructed 07/30/16   Fredia Beets R, FNP  insulin glargine (LANTUS) 100 unit/mL SOPN Inject 0.25 mLs (25 Units total) into the skin at bedtime. 09/26/17 10/26/17  Gildardo Pounds, NP  Insulin Pen Needle (B-D UF III MINI PEN NEEDLES) 31G X 5 MM MISC Use as instructed 09/28/17   Gildardo Pounds, NP  Insulin Syringe-Needle U-100 (B-D INS SYRINGE 0.5CC/31GX5/16) 31G X 5/16" 0.5 ML MISC USE AS DIRECTED DAILY FOR INSULIN ADMINISTRATION 11/29/16   Alfonse Spruce, FNP  metFORMIN (GLUCOPHAGE-XR) 500 MG 24 hr tablet Take 2 tablets (1,000 mg total) by mouth 2 (two) times daily. 04/05/17   Alfonse Spruce, FNP  propranolol ER (INDERAL LA) 80 MG 24 hr capsule Take 1 capsule (80 mg total) by mouth daily. 07/13/17 10/11/17  Gildardo Pounds, NP  rosuvastatin (CRESTOR) 10 MG tablet Take 1 tablet (10 mg total) by mouth daily. 08/22/17   Gildardo Pounds, NP    Allergies  Allergen Reactions  . Ace Inhibitors Shortness Of Breath    Reported by the patient after a trial of lisinopril prescribes by PCP  . Aspirin Anaphylaxis  . Ciprofloxacin Itching    Family History  Problem  Relation Age of Onset  . Hypertension Mother   . Lung cancer Mother   . Throat cancer Father   . Diabetes Sister   . Lupus Maternal Grandmother   . Stroke Maternal Grandfather   . Sudden Cardiac Death Daughter   . Breast cancer Paternal Aunt     Social History Social History   Tobacco Use  . Smoking status: Never Smoker  . Smokeless tobacco: Never Used  Substance Use Topics  . Alcohol use: No  . Drug use: No    Review of Systems Constitutional: Negative for  fever. ENT: Negative for recent illness/congestion Cardiovascular: Positive for left upper chest pain, sharp, moderate now mild. Respiratory: Negative for shortness of breath. Gastrointestinal: Negative for abdominal pain, vomiting and diarrhea. Musculoskeletal: Negative for leg pain or swelling Neurological: Negative for headache All other ROS negative  ____________________________________________   PHYSICAL EXAM:  VITAL SIGNS: ED Triage Vitals  Enc Vitals Group     BP 10/01/17 0957 140/86     Pulse Rate 10/01/17 0957 86     Resp 10/01/17 0957 18     Temp 10/01/17 0957 98.4 F (36.9 C)     Temp Source 10/01/17 0957 Oral     SpO2 10/01/17 0957 98 %     Weight 10/01/17 0959 205 lb (93 kg)     Height 10/01/17 0959 5\' 10"  (1.778 m)     Head Circumference --      Peak Flow --      Pain Score 10/01/17 0958 10     Pain Loc --      Pain Edu? --      Excl. in [place]? --    Constitutional: Alert and oriented. Well appearing and in no distress. Eyes: Normal exam ENT   Head: Normocephalic and atraumatic.   Mouth/Throat: Mucous membranes are moist. Cardiovascular: Normal rate, regular rhythm. No murmur Respiratory: Normal respiratory effort without tachypnea nor retractions. Breath sounds are clear.  Chest wall is nontender. Gastrointestinal: Soft and nontender. No distention.   Musculoskeletal: Nontender with normal range of motion in all extremities.  Neurologic:  Normal speech and language. No gross focal neurologic deficits are appreciated. Skin:  Skin is warm, dry and intact.  Psychiatric: Mood and affect are normal.   ____________________________________________    EKG  EKG reviewed and interpreted by myself shows normal sinus rhythm 89 bpm with a narrow QRS, normal axis, normal intervals, no concerning ST changes.  ____________________________________________    RADIOLOGY  Chest x-ray negative  ____________________________________________   INITIAL  IMPRESSION / ASSESSMENT AND PLAN / ED COURSE  Pertinent labs & imaging results that were available during my care of the patient were reviewed by me and considered in my medical decision making (see chart for details).  Patient presents to the emergency department for left upper chest pain which occurred approximately 30 minutes prior to arrival, and has dissipated and is now only mild pain.  Differential is quite broad but would include ACS, musculoskeletal pain, pneumonia, pneumothorax, pulmonary embolism.  Patient's vitals are reassuring, no pleuritic chest pain, no leg pain or swelling, however as a precaution we will send a d-dimer.  We will check labs including troponin.  EKG is reassuring.  Chest x-ray is negative.  Labs are normal including a negative d-dimer.  Troponin x2 are normal.  Patient continues to appear very well in the emergency department, no distress.  We will discharge the patient home with PCP follow-up.  I discussed very strict return chest pain  precautions.  ____________________________________________   FINAL CLINICAL IMPRESSION(S) / ED DIAGNOSES  Chest pain    Harvest Dark, MD 10/01/17 1310    Harvest Dark, MD 10/01/17 1312

## 2017-10-01 NOTE — ED Triage Notes (Signed)
L upper chest, shoulder and neck pain began 20 minutes ago.

## 2017-10-12 ENCOUNTER — Ambulatory Visit: Payer: Medicaid Other | Admitting: Nurse Practitioner

## 2017-10-20 MED FILL — $LANTUS SOLOSTAR 100 UNITS/: 100 | 24 days supply | Qty: 6 | Fill #3

## 2017-10-25 MED FILL — PROPRANOLOL ER 80 MG CAP: 80 | 30 days supply | Qty: 30 | Fill #1

## 2017-11-11 ENCOUNTER — Ambulatory Visit: Payer: Self-pay | Admitting: Nurse Practitioner

## 2017-11-17 ENCOUNTER — Other Ambulatory Visit: Payer: Self-pay | Admitting: Nurse Practitioner

## 2017-11-17 DIAGNOSIS — Z1231 Encounter for screening mammogram for malignant neoplasm of breast: Secondary | ICD-10-CM

## 2017-11-28 MED FILL — $LANTUS SOLOSTAR 100 UNITS/: 100 | 24 days supply | Qty: 6 | Fill #4

## 2017-11-28 MED FILL — PROPRANOLOL ER 80 MG CAP: 80 | 30 days supply | Qty: 30 | Fill #2

## 2017-12-09 ENCOUNTER — Other Ambulatory Visit: Payer: Self-pay

## 2017-12-09 DIAGNOSIS — E1165 Type 2 diabetes mellitus with hyperglycemia: Secondary | ICD-10-CM

## 2017-12-09 MED ORDER — METFORMIN HCL ER 500 MG PO TB24: 1000.0000 mg | ORAL_TABLET | Freq: Two times a day (BID) | ORAL | 2 refills | Status: DC

## 2017-12-09 MED FILL — METFORMIN HCL ER 500 MG TAB: 500 | 30 days supply | Qty: 120 | Fill #0

## 2017-12-13 ENCOUNTER — Ambulatory Visit: Payer: Self-pay

## 2017-12-25 ENCOUNTER — Other Ambulatory Visit: Payer: Self-pay

## 2017-12-25 ENCOUNTER — Emergency Department
Admission: EM | Admit: 2017-12-25 | Discharge: 2017-12-25 | Disposition: A | Discharge status: Home / Self Care | Payer: Medicaid Other | Attending: Emergency Medicine | Admitting: Emergency Medicine

## 2017-12-25 DIAGNOSIS — Z794 Long term (current) use of insulin: Secondary | ICD-10-CM | POA: Insufficient documentation

## 2017-12-25 DIAGNOSIS — I10 Essential (primary) hypertension: Secondary | ICD-10-CM | POA: Insufficient documentation

## 2017-12-25 DIAGNOSIS — Z79899 Other long term (current) drug therapy: Secondary | ICD-10-CM | POA: Insufficient documentation

## 2017-12-25 DIAGNOSIS — R112 Nausea with vomiting, unspecified: Secondary | ICD-10-CM

## 2017-12-25 DIAGNOSIS — E119 Type 2 diabetes mellitus without complications: Secondary | ICD-10-CM | POA: Insufficient documentation

## 2017-12-25 DIAGNOSIS — R197 Diarrhea, unspecified: Secondary | ICD-10-CM | POA: Insufficient documentation

## 2017-12-25 LAB — COMPREHENSIVE METABOLIC PANEL
ALK PHOS: 52 U/L (ref 38–126)
ALT: 25 U/L (ref 0–44)
ANION GAP: 11 (ref 5–15)
AST: 27 U/L (ref 15–41)
Albumin: 3.8 g/dL (ref 3.5–5.0)
BUN: 9 mg/dL (ref 6–20)
CALCIUM: 8.5 mg/dL — AB (ref 8.9–10.3)
CO2: 20 mmol/L — ABNORMAL LOW (ref 22–32)
CREATININE: 0.63 mg/dL (ref 0.44–1.00)
Chloride: 104 mmol/L (ref 98–111)
Glucose, Bld: 217 mg/dL — ABNORMAL HIGH (ref 70–99)
Potassium: 3.5 mmol/L (ref 3.5–5.1)
Sodium: 135 mmol/L (ref 135–145)
TOTAL PROTEIN: 7.4 g/dL (ref 6.5–8.1)
Total Bilirubin: 0.4 mg/dL (ref 0.3–1.2)

## 2017-12-25 LAB — URINALYSIS, COMPLETE (UACMP) WITH MICROSCOPIC
Bilirubin Urine: NEGATIVE
Glucose, UA: NEGATIVE mg/dL
KETONES UR: NEGATIVE mg/dL
LEUKOCYTES UA: NEGATIVE
NITRITE: NEGATIVE
Protein, ur: NEGATIVE mg/dL
Specific Gravity, Urine: 1.021 (ref 1.005–1.030)
pH: 6 (ref 5.0–8.0)

## 2017-12-25 LAB — CBC
HCT: 32.6 % — ABNORMAL LOW (ref 35.0–47.0)
Hemoglobin: 11.1 g/dL — ABNORMAL LOW (ref 12.0–16.0)
MCH: 27.7 pg (ref 26.0–34.0)
MCHC: 34.1 g/dL (ref 32.0–36.0)
MCV: 81.1 fL (ref 80.0–100.0)
PLATELETS: 408 10*3/uL (ref 150–440)
RBC: 4.02 MIL/uL (ref 3.80–5.20)
RDW: 15.7 % — ABNORMAL HIGH (ref 11.5–14.5)
WBC: 6 10*3/uL (ref 3.6–11.0)

## 2017-12-25 LAB — LIPASE, BLOOD: Lipase: 32 U/L (ref 11–51)

## 2017-12-25 MED ORDER — MORPHINE SULFATE (PF) 2 MG/ML IV SOLN
2.0000 mg | Freq: Once | INTRAVENOUS | Status: AC
  Administered 2017-12-25: 2 mg via INTRAVENOUS
  Filled 2017-12-25: qty 1

## 2017-12-25 MED ORDER — SODIUM CHLORIDE 0.9 % IV BOLUS
1000.0000 mL | Freq: Once | INTRAVENOUS | Status: AC
  Administered 2017-12-25: 1000 mL via INTRAVENOUS

## 2017-12-25 MED ORDER — PROCHLORPERAZINE MALEATE 5 MG PO TABS: 5.0000 mg | ORAL_TABLET | Freq: Four times a day (QID) | ORAL | 0 refills | Status: DC | PRN

## 2017-12-25 MED ORDER — PROCHLORPERAZINE EDISYLATE 10 MG/2ML IJ SOLN
5.0000 mg | Freq: Once | INTRAMUSCULAR | Status: AC
Start: 2017-12-25 — End: 2017-12-25
  Administered 2017-12-25: 5 mg via INTRAVENOUS
  Filled 2017-12-25: qty 2

## 2017-12-25 MED ORDER — AMOXICILLIN-POT CLAVULANATE 875-125 MG PO TABS: 1.0000 | ORAL_TABLET | Freq: Two times a day (BID) | ORAL | 0 refills | Status: AC

## 2017-12-25 NOTE — ED Triage Notes (Signed)
Pt c/o abd pain, n/v/d since eating [employer] last night.

## 2017-12-25 NOTE — Discharge Instructions (Signed)
I have written you a prescription for Augmentin 1 twice a day and Compazine for nausea 1 4 times a day.  If you are not having any nausea vomiting or diarrhea anytime in the next few hours it may be that you are finished with it and you do not need any antibiotics or nausea medicine.  So I would see if you can avoid getting the prescription filled for a couple hours.  Please return here for fever feeling sicker or lightheaded or not able to keep anything down or any bloody diarrhea.  Follow-up with your doctor in the next few days if you are not completely well.

## 2017-12-25 NOTE — ED Provider Notes (Signed)
[facility] Emergency Department Provider Note   ____________________________________________   First MD Initiated Contact with Patient 12/25/17 1506     (approximate)  I have reviewed the triage vital signs and the nursing notes.   HISTORY  Chief Complaint Nausea; Emesis; and Diarrhea    Tonya Paul is a 42 y.o. female who reports she ate at [employer] yesterday.  She had a hamburger.  Since then she is been having nausea vomiting diarrhea.  Last vomiting was out in the lobby last diarrhea was just before she got here.  Stool is very watery.  She is having chills now.  She does not have a fever.  She also has a history of diabetes and prolonged QT.  She is having fairly severe crampy abdominal pain.   Past Medical History:  Diagnosis Date  . Diabetes mellitus without complication ([place])   . Hemorrhoids 11/18/2015  . Hypertension   . Left breast mass 09/01/2015  . 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 Palpitations 02/03/2016   PVCs seen on monitor  . Vertigo     Patient Active Problem List   Diagnosis Date Noted  . Abdominal pain, chronic, epigastric   . GERD (gastroesophageal reflux disease) 06/20/2017  . Long Q-T syndrome 01/14/2017  . Palpitations 02/03/2016  . Uterine fibroid 12/11/2015  . Family history of heart disease 12/03/2015  . Hemorrhoids 11/18/2015  . Left breast mass 09/01/2015  . Obesity 09/01/2015  . Type 2 diabetes mellitus, uncontrolled ([place]) 04/16/2015    Past Surgical History:  Procedure Laterality Date  . BREAST BIOPSY Left 09/03/2015   [place] Core bx, benign  . ESOPHAGOGASTRODUODENOSCOPY (EGD) WITH PROPOFOL N/A 06/21/2017   Procedure: ESOPHAGOGASTRODUODENOSCOPY (EGD) WITH PROPOFOL;  Surgeon: Lin Landsman, MD;  Location: [place];  Service: Gastroenterology;  Laterality: N/A;    Prior to Admission medications   Medication Sig Start Date End Date Taking? Authorizing Provider    amoxicillin-clavulanate (AUGMENTIN) 875-125 MG tablet Take 1 tablet by mouth 2 (two) times daily for 10 days. 12/25/17 01/04/18  Nena Polio, MD  Blood Glucose Monitoring Suppl (TRUE METRIX METER) DEVI 1 each by Does not apply route 3 (three) times daily before meals. 04/16/15   Charlott Rakes, MD  glucose blood (RELION GLUCOSE TEST STRIPS) test strip Use as instructed 07/30/16   Fredia Beets R, FNP  insulin glargine (LANTUS) 100 unit/mL SOPN Inject 0.25 mLs (25 Units total) into the skin at bedtime. 09/26/17 10/26/17  Gildardo Pounds, NP  Insulin Pen Needle (B-D UF III MINI PEN NEEDLES) 31G X 5 MM MISC Use as instructed 09/28/17   Gildardo Pounds, NP  Insulin Syringe-Needle U-100 (B-D INS SYRINGE 0.5CC/31GX5/16) 31G X 5/16" 0.5 ML MISC USE AS DIRECTED DAILY FOR INSULIN ADMINISTRATION 11/29/16   Alfonse Spruce, FNP  metFORMIN (GLUCOPHAGE-XR) 500 MG 24 hr tablet Take 2 tablets (1,000 mg total) by mouth 2 (two) times daily. 12/09/17   Gildardo Pounds, NP  prochlorperazine (COMPAZINE) 5 MG tablet Take 1 tablet (5 mg total) by mouth every 6 (six) hours as needed for nausea or vomiting. 12/25/17   Nena Polio, MD  propranolol ER (INDERAL LA) 80 MG 24 hr capsule Take 1 capsule (80 mg total) by mouth daily. 07/13/17 10/11/17  Gildardo Pounds, NP  rosuvastatin (CRESTOR) 10 MG tablet Take 1 tablet (10 mg total) by mouth daily. 08/22/17   Gildardo Pounds, NP    Allergies Ace inhibitors; Aspirin;  and Ciprofloxacin  Family History  Problem Relation Age of Onset  . Hypertension Mother   . Lung cancer Mother   . Throat cancer Father   . Diabetes Sister   . Lupus Maternal Grandmother   . Stroke Maternal Grandfather   . Sudden Cardiac Death Daughter   . Breast cancer Paternal Aunt     Social History Social History   Tobacco Use  . Smoking status: Never Smoker  . Smokeless tobacco: Never Used  Substance Use Topics  . Alcohol use: No  . Drug use: No    Review of  Systems  Constitutional: No fever/chills Eyes: No visual changes. ENT: No sore throat. Cardiovascular: Denies chest pain. Respiratory: Denies shortness of breath. Gastrointestinal: Crampy abdominal pain.   nausea,  vomiting.   diarrhea.  No constipation. Genitourinary: Negative for dysuria. Musculoskeletal: Negative for back pain. Skin: Negative for rash. Neurological: Negative for headaches, focal weakness   ____________________________________________   PHYSICAL EXAM:  VITAL SIGNS: ED Triage Vitals [12/25/17 1300]  Enc Vitals Group     BP (!) 170/98     Pulse Rate 83     Resp 18     Temp 98.2 F (36.8 C)     Temp src      SpO2 99 %     Weight 203 lb (92.1 kg)     Height 5\' 10"  (1.778 m)     Head Circumference      Peak Flow      Pain Score 7     Pain Loc      Pain Edu?      Excl. in [place]?     Constitutional: Alert and oriented. Well appearing and in no acute distress. Eyes: Conjunctivae are normal. PER.  Head: Atraumatic. Nose: No congestion/rhinnorhea. Mouth/Throat: Mucous membranes are moist.  Oropharynx non-erythematous. Neck: No stridor. Cardiovascular: Normal rate, regular rhythm. Grossly normal heart sounds.  Good peripheral circulation. Respiratory: Normal respiratory effort.  No retractions. Lungs CTAB. Gastrointestinal: Soft and nontender. No distention. No abdominal bruits. No CVA tenderness. Musculoskeletal: No lower extremity tenderness nor edema.   Neurologic:  Normal speech and language. No gross focal neurologic deficits are appreciated.  Skin:  Skin is warm, dry and intact. No rash noted. Psychiatric: Mood and affect are normal. Speech and behavior are normal.  ____________________________________________   LABS (all labs ordered are listed, but only abnormal results are displayed)  Labs Reviewed  COMPREHENSIVE METABOLIC PANEL - Abnormal; Notable for the following components:      Result Value   CO2 20 (*)    Glucose, Bld 217 (*)     Calcium 8.5 (*)    All other components within normal limits  CBC - Abnormal; Notable for the following components:   Hemoglobin 11.1 (*)    HCT 32.6 (*)    RDW 15.7 (*)    All other components within normal limits  URINALYSIS, COMPLETE (UACMP) WITH MICROSCOPIC - Abnormal; Notable for the following components:   Color, Urine YELLOW (*)    APPearance CLEAR (*)    Hgb urine dipstick MODERATE (*)    Bacteria, UA MANY (*)    All other components within normal limits  GASTROINTESTINAL PANEL BY PCR, STOOL (REPLACES STOOL CULTURE)  C DIFFICILE QUICK SCREEN W PCR REFLEX  LIPASE, BLOOD  POC URINE PREG, ED   ____________________________________________  EKG   ____________________________________________  RADIOLOGY  ED MD interpretation:   Official radiology report(s): No results found.  ____________________________________________   PROCEDURES  Procedure(s) performed:  Procedures  Critical Care performed:   ____________________________________________   INITIAL IMPRESSION / ASSESSMENT AND PLAN / ED COURSE   She with no further nausea vomiting or diarrhea.  Cramps are much better.  I told her I will give her the Augmentin Flagyl and Compazine but to hold off filling them if she has not had any more diarrhea or vomiting after couple hours possibly she will need any.  He could be related to staphylococcal toxin toxicity        ____________________________________________   FINAL CLINICAL IMPRESSION(S) / ED DIAGNOSES  Final diagnoses:  Nausea vomiting and diarrhea     ED Discharge Orders         Ordered    amoxicillin-clavulanate (AUGMENTIN) 875-125 MG tablet  2 times daily     12/25/17 1723    prochlorperazine (COMPAZINE) 5 MG tablet  Every 6 hours PRN     12/25/17 1723           Note:  This document was prepared using Dragon voice recognition software and may include unintentional dictation errors.    Nena Polio, MD 12/25/17 1725

## 2017-12-27 MED FILL — !LANTUS SOLOSTAR 100UNITS/M: 100 | 24 days supply | Qty: 6 | Fill #5

## 2017-12-27 MED FILL — PROPRANOLOL ER 80 MG CAP: 80 | 30 days supply | Qty: 30 | Fill #3

## 2017-12-30 ENCOUNTER — Ambulatory Visit: Payer: Self-pay

## 2018-01-06 ENCOUNTER — Ambulatory Visit: Payer: Medicaid Other | Admitting: Nurse Practitioner

## 2018-01-22 ENCOUNTER — Emergency Department
Admission: EM | Admit: 2018-01-22 | Discharge: 2018-01-22 | Disposition: A | Discharge status: Home / Self Care | Payer: Medicaid Other | Attending: Emergency Medicine | Admitting: Emergency Medicine

## 2018-01-22 ENCOUNTER — Encounter: Payer: Self-pay | Admitting: *Deleted

## 2018-01-22 ENCOUNTER — Other Ambulatory Visit: Payer: Self-pay

## 2018-01-22 DIAGNOSIS — E1165 Type 2 diabetes mellitus with hyperglycemia: Secondary | ICD-10-CM | POA: Insufficient documentation

## 2018-01-22 DIAGNOSIS — I10 Essential (primary) hypertension: Secondary | ICD-10-CM | POA: Insufficient documentation

## 2018-01-22 DIAGNOSIS — N390 Urinary tract infection, site not specified: Secondary | ICD-10-CM

## 2018-01-22 DIAGNOSIS — R739 Hyperglycemia, unspecified: Secondary | ICD-10-CM

## 2018-01-22 LAB — URINALYSIS, COMPLETE (UACMP) WITH MICROSCOPIC
BILIRUBIN URINE: NEGATIVE
Glucose, UA: >=500 mg/dL — AB
Hgb urine dipstick: NEGATIVE
Ketones, ur: 5 mg/dL — AB
Nitrite: POSITIVE — AB
Protein, ur: 30 mg/dL — AB
SPECIFIC GRAVITY, URINE: 1.025 (ref 1.005–1.030)
pH: 6 (ref 5.0–8.0)

## 2018-01-22 LAB — CBC
HEMATOCRIT: 36 % (ref 36.0–46.0)
Hemoglobin: 11.5 g/dL — ABNORMAL LOW (ref 12.0–15.0)
MCH: 25.8 pg — ABNORMAL LOW (ref 26.0–34.0)
MCHC: 31.9 g/dL (ref 30.0–36.0)
MCV: 80.9 fL (ref 80.0–100.0)
NRBC: 0 % (ref 0.0–0.2)
Platelets: 467 10*3/uL — ABNORMAL HIGH (ref 150–400)
RBC: 4.45 MIL/uL (ref 3.87–5.11)
RDW: 14.5 % (ref 11.5–15.5)
WBC: 7.1 10*3/uL (ref 4.0–10.5)

## 2018-01-22 LAB — BASIC METABOLIC PANEL
Anion gap: 11 (ref 5–15)
BUN: 10 mg/dL (ref 6–20)
CO2: 21 mmol/L — AB (ref 22–32)
CREATININE: 0.88 mg/dL (ref 0.44–1.00)
Calcium: 9.5 mg/dL (ref 8.9–10.3)
Chloride: 102 mmol/L (ref 98–111)
GFR calc Af Amer: >60 mL/min (ref >60)
GFR calc non Af Amer: >60 mL/min (ref >60)
GLUCOSE: 283 mg/dL — AB (ref 70–99)
Potassium: 3.6 mmol/L (ref 3.5–5.1)
Sodium: 134 mmol/L — ABNORMAL LOW (ref 135–145)

## 2018-01-22 LAB — GLUCOSE, CAPILLARY
GLUCOSE-CAPILLARY: 282 mg/dL — AB (ref 70–99)
GLUCOSE-CAPILLARY: 206 mg/dL — AB (ref 70–99)

## 2018-01-22 LAB — POCT PREGNANCY, URINE: PREG TEST UR: NEGATIVE

## 2018-01-22 MED ORDER — SODIUM CHLORIDE 0.9 % IV SOLN
1.0000 g | Freq: Once | INTRAVENOUS | Status: AC
  Administered 2018-01-22: 1 g via INTRAVENOUS
  Filled 2018-01-22: qty 10

## 2018-01-22 MED ORDER — CEPHALEXIN 500 MG PO CAPS: 500.0000 mg | ORAL_CAPSULE | Freq: Four times a day (QID) | ORAL | 0 refills | Status: AC

## 2018-01-22 MED ORDER — DIPHENHYDRAMINE HCL 50 MG/ML IJ SOLN
25.0000 mg | Freq: Once | INTRAMUSCULAR | Status: AC
  Administered 2018-01-22: 25 mg via INTRAVENOUS

## 2018-01-22 MED ORDER — SULFAMETHOXAZOLE-TRIMETHOPRIM 800-160 MG PO TABS: 1.0000 | ORAL_TABLET | Freq: Two times a day (BID) | ORAL | 0 refills | Status: DC

## 2018-01-22 MED ORDER — ACETAMINOPHEN 500 MG PO TABS
1000.0000 mg | ORAL_TABLET | Freq: Once | ORAL | Status: AC
  Administered 2018-01-22: 1000 mg via ORAL
  Filled 2018-01-22: qty 2

## 2018-01-22 MED ORDER — SULFAMETHOXAZOLE-TRIMETHOPRIM 800-160 MG PO TABS
1.0000 | ORAL_TABLET | Freq: Once | ORAL | Status: AC
  Administered 2018-01-22: 1 via ORAL
  Filled 2018-01-22: qty 1

## 2018-01-22 MED ORDER — DIPHENHYDRAMINE HCL 50 MG/ML IJ SOLN
INTRAMUSCULAR | Status: AC
  Filled 2018-01-22: qty 1

## 2018-01-22 MED ORDER — ONDANSETRON HCL 4 MG/2ML IJ SOLN
4.0000 mg | Freq: Once | INTRAMUSCULAR | Status: AC
  Administered 2018-01-22: 4 mg via INTRAVENOUS
  Filled 2018-01-22: qty 2

## 2018-01-22 NOTE — ED Notes (Signed)
Pt reported burning pain at IV site radiating towards her wrist. IV site assessed with no evident signs of infiltration or visible swelling. Pain reported as 10 on 1-10 scale. IV discontinued. Event reported to Dr Jimmye Norman.

## 2018-01-22 NOTE — ED Notes (Signed)
Pt complained of sudden onset of nausea, and subsequently vomited small amount of greenish emesis followed by dry retching for about 1 minute. EDP notified and placed verbal order for 4 mg Zofran IV.

## 2018-01-22 NOTE — ED Notes (Addendum)
Pt reported sudden onset of itching all over her body. IV rocephin stopped. Dr Jimmye Norman notified. Pt denied any respiratory difficulty. Verbal order for 25mg  benadryl IV given by EDP.

## 2018-01-22 NOTE — ED Provider Notes (Addendum)
[facility] Emergency Department Provider Note       Time seen: ----------------------------------------- 4:50 PM on 01/22/2018 -----------------------------------------   I have reviewed the triage vital signs and the nursing notes.  HISTORY   Chief Complaint Hyperglycemia    HPI Nasreen Goedecke is a 42 y.o. female with a history of diabetes, hypertension, long QT syndrome who presents to the ED for hyperglycemia.  Patient states she has had generalized fatigue as well as some chills.  Blood sugar was in the 500s when she checks it at home today although she is taking her medications as prescribed.  She has not had any other flu symptoms, has had some nausea but no diarrhea or vomiting.  She has had some lower abdominal cramping.  Past Medical History:  Diagnosis Date  . Diabetes mellitus without complication ([place])   . Hemorrhoids 11/18/2015  . Hypertension   . Left breast mass 09/01/2015  . 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 Palpitations 02/03/2016   PVCs seen on monitor  . Vertigo     Patient Active Problem List   Diagnosis Date Noted  . Abdominal pain, chronic, epigastric   . GERD (gastroesophageal reflux disease) 06/20/2017  . Long Q-T syndrome 01/14/2017  . Palpitations 02/03/2016  . Uterine fibroid 12/11/2015  . Family history of heart disease 12/03/2015  . Hemorrhoids 11/18/2015  . Left breast mass 09/01/2015  . Obesity 09/01/2015  . Type 2 diabetes mellitus, uncontrolled ([place]) 04/16/2015    Past Surgical History:  Procedure Laterality Date  . BREAST BIOPSY Left 09/03/2015   [place] Core bx, benign  . ESOPHAGOGASTRODUODENOSCOPY (EGD) WITH PROPOFOL N/A 06/21/2017   Procedure: ESOPHAGOGASTRODUODENOSCOPY (EGD) WITH PROPOFOL;  Surgeon: Lin Landsman, MD;  Location: [place];  Service: Gastroenterology;  Laterality: N/A;    Allergies Ace inhibitors; Aspirin; and Ciprofloxacin  Social History Social History    Tobacco Use  . Smoking status: Never Smoker  . Smokeless tobacco: Never Used  Substance Use Topics  . Alcohol use: No  . Drug use: No   Review of Systems Constitutional: Positive for fever and chills Cardiovascular: Negative for chest pain. Respiratory: Negative for shortness of breath. Gastrointestinal: Positive for abdominal cramping, nausea Genitourinary: Negative for dysuria. Musculoskeletal: Negative for back pain. Skin: Negative for rash. Neurological: Negative for headaches, focal weakness or numbness.  All systems negative/normal/unremarkable except as stated in the HPI  ____________________________________________   PHYSICAL EXAM:  VITAL SIGNS: ED Triage Vitals  Enc Vitals Group     BP 01/22/18 1324 133/84     Pulse Rate 01/22/18 1324 (!) 101     Resp 01/22/18 1324 16     Temp 01/22/18 1324 98.1 F (36.7 C)     Temp Source 01/22/18 1324 Oral     SpO2 01/22/18 1324 98 %     Weight 01/22/18 1321 203 lb (92.1 kg)     Height 01/22/18 1321 5\' 10"  (1.778 m)     Head Circumference --      Peak Flow --      Pain Score 01/22/18 1320 0     Pain Loc --      Pain Edu? --      Excl. in [place]? --    Constitutional: Alert and oriented. Well appearing and in no distress. Eyes: Conjunctivae are normal. Normal extraocular movements. ENT   Head: Normocephalic and atraumatic.   Nose: No congestion/rhinnorhea.   Mouth/Throat: Mucous membranes are moist.   Neck: No stridor. Cardiovascular:  Normal rate, regular rhythm. No murmurs, rubs, or gallops. Respiratory: Normal respiratory effort without tachypnea nor retractions. Breath sounds are clear and equal bilaterally. No wheezes/rales/rhonchi. Gastrointestinal: Soft and nontender. Normal bowel sounds Musculoskeletal: Nontender with normal range of motion in extremities. No lower extremity tenderness nor edema. Neurologic:  Normal speech and language. No gross focal neurologic deficits are appreciated.  Skin:  Skin  is warm, dry and intact. No rash noted. Psychiatric: Mood and affect are normal. Speech and behavior are normal.  ____________________________________________  ED COURSE:  As part of my medical decision making, I reviewed the following data within the [facility] History obtained from family if available, nursing notes, old chart and ekg, as well as notes from prior ED visits. Patient presented for hyperglycemia with recent chills, we will assess with labs and imaging as indicated at this time. Clinical Course as of Jan 23 1936  Nancy Fetter Jan 22, 2018  1820 Patient appeared to have a reaction to Rocephin causing itching.  She subsequently had some vomiting but no other symptoms of anaphylaxis.  She has received IV Benadryl and Zofran.   [JW]    Clinical Course User Index [JW] Earleen Newport, MD   Procedures ____________________________________________   LABS (pertinent positives/negatives)  Labs Reviewed  GLUCOSE, CAPILLARY - Abnormal; Notable for the following components:      Result Value   Glucose-Capillary 282 (*)    All other components within normal limits  BASIC METABOLIC PANEL - Abnormal; Notable for the following components:   Sodium 134 (*)    CO2 21 (*)    Glucose, Bld 283 (*)    All other components within normal limits  CBC - Abnormal; Notable for the following components:   Hemoglobin 11.5 (*)    MCH 25.8 (*)    Platelets 467 (*)    All other components within normal limits  URINALYSIS, COMPLETE (UACMP) WITH MICROSCOPIC - Abnormal; Notable for the following components:   Color, Urine YELLOW (*)    APPearance CLOUDY (*)    Glucose, UA >=500 (*)    Ketones, ur 5 (*)    Protein, ur 30 (*)    Nitrite POSITIVE (*)    Leukocytes, UA TRACE (*)    Bacteria, UA MANY (*)    All other components within normal limits  GLUCOSE, CAPILLARY - Abnormal; Notable for the following components:   Glucose-Capillary 206 (*)    All other components within normal  limits  URINE CULTURE  CBG MONITORING, ED  POCT PREGNANCY, URINE  POC URINE PREG, ED  ____________________________________________  DIFFERENTIAL DIAGNOSIS   Hyperglycemia, DKA, dehydration, electrolyte abnormality, occult infection  FINAL ASSESSMENT AND PLAN  Hyperglycemia, UTI   Plan: The patient had presented for hyperglycemia and chills which appears to be from a urinary tract infection. Patient's labs revealed normal CBC although she has mild anemia, blood glucose was improving down to about 200.  Urine was indicative of urinary tract infection and a urine culture was sent.  She did receive IV Rocephin and will be discharged home with oral antibiotics.  She is cleared for outpatient follow-up.   Laurence Aly, MD   Note: This note was generated in part or whole with voice recognition software. Voice recognition is usually quite accurate but there are transcription errors that can and very often do occur. I apologize for any typographical errors that were not detected and corrected.     Earleen Newport, MD 01/22/18 1657    Lenise Arena  E, MD 01/22/18 7106

## 2018-01-22 NOTE — ED Notes (Signed)
Pt reports chills and nausea for the last 2 days. Pt denies any other symptoms. Pt states that this morning her CBG was over 400 which concerned her. Pt denies missing any medications.

## 2018-01-22 NOTE — ED Notes (Signed)
Re-checked patient in subwait.  Patient states she is feeling palpitations and is somewhat dizzy at this time.  Patient states she has history of long QT syndrome.

## 2018-01-22 NOTE — ED Triage Notes (Signed)
Pts glucose was in the 500s today when she checked it at home. Pt reports generalized fatigue and shaking in her hands. Pt is alert and oriented x 4 with no neuro deficits. Pt has not been checking glucose for the past three days but reports the weakness has been x 3 days as well with one episode of near syncope. PT ambulatory in the lobby.

## 2018-01-25 LAB — URINE CULTURE

## 2018-01-27 MED FILL — LANTUS SOLOSTAR 100 UNITS/M: 100 | 24 days supply | Qty: 6 | Fill #6

## 2018-01-27 MED FILL — PROPRANOLOL ER 80 MG CAP: 80 | 30 days supply | Qty: 30 | Fill #4

## 2018-02-14 ENCOUNTER — Encounter: Payer: Self-pay | Admitting: Nurse Practitioner

## 2018-02-14 ENCOUNTER — Ambulatory Visit: Payer: Self-pay | Attending: Nurse Practitioner | Admitting: Nurse Practitioner

## 2018-02-14 VITALS — BP 143/93 | HR 76 | Temp 98.3°F | Ht 71.0 in | Wt 208.0 lb

## 2018-02-14 DIAGNOSIS — Z79899 Other long term (current) drug therapy: Secondary | ICD-10-CM | POA: Insufficient documentation

## 2018-02-14 DIAGNOSIS — Z886 Allergy status to analgesic agent status: Secondary | ICD-10-CM | POA: Insufficient documentation

## 2018-02-14 DIAGNOSIS — E1165 Type 2 diabetes mellitus with hyperglycemia: Secondary | ICD-10-CM | POA: Insufficient documentation

## 2018-02-14 DIAGNOSIS — Z888 Allergy status to other drugs, medicaments and biological substances status: Secondary | ICD-10-CM | POA: Insufficient documentation

## 2018-02-14 DIAGNOSIS — I10 Essential (primary) hypertension: Secondary | ICD-10-CM | POA: Insufficient documentation

## 2018-02-14 DIAGNOSIS — Z881 Allergy status to other antibiotic agents status: Secondary | ICD-10-CM | POA: Insufficient documentation

## 2018-02-14 DIAGNOSIS — R002 Palpitations: Secondary | ICD-10-CM | POA: Insufficient documentation

## 2018-02-14 DIAGNOSIS — R42 Dizziness and giddiness: Secondary | ICD-10-CM

## 2018-02-14 DIAGNOSIS — E871 Hypo-osmolality and hyponatremia: Secondary | ICD-10-CM | POA: Insufficient documentation

## 2018-02-14 DIAGNOSIS — Z794 Long term (current) use of insulin: Secondary | ICD-10-CM | POA: Insufficient documentation

## 2018-02-14 LAB — POCT GLYCOSYLATED HEMOGLOBIN (HGB A1C): Hemoglobin A1C: 6.9 % — AB (ref 4.0–5.6)

## 2018-02-14 LAB — GLUCOSE, POCT (MANUAL RESULT ENTRY): POC Glucose: 113 mg/dl — AB (ref 70–99)

## 2018-02-14 MED FILL — METFORMIN HCL ER 500 MG TAB: 500 | 30 days supply | Qty: 120 | Fill #1

## 2018-02-14 NOTE — Progress Notes (Signed)
Assessment & Plan:  Tonya Paul was seen today for hospitalization follow-up.  Diagnoses and all orders for this visit:  Uncontrolled type 2 diabetes mellitus with hyperglycemia (HCC) -     Glucose (CBG) -     HgB A1c -     Microalbumin/Creatinine Ratio, Urine  Essential hypertension -     propranolol ER (INDERAL LA) 80 MG 24 hr capsule; Take 1 capsule (80 mg total) by mouth daily.  Hyponatremia -     CMP14+EGFR    Patient has been counseled on age-appropriate routine health concerns for screening and prevention. These are reviewed and up-to-date. Referrals have been placed accordingly. Immunizations are up-to-date or declined.    Subjective:   Chief Complaint  Patient presents with  . Hospitalization Follow-up    Pt. stated no longer any symptoms for UTI.    HPI Tonya Paul 42 y.o. female presents to office today for hospital follow up.    UTI She was treated in the ED on 01-22-2018 for hyperglycemia and UTI. She received IV rocephin however it appeared she had a reaction to Rocephin (itching) and was treated with IV benadryl and Zofran. She was sent home on PO bactrim. Today she reports complete resolution of her symptoms. She has completed the bactrim prescription.  DM TYPE 2 A1c down from 7.7 to 6.9. Improved. Weight is stable although she has gained a few pounds. She is currently taking metformin 1000 mg BID and Lantus 25 units at bedtime. She currently denies any hypoglycemic symptoms. Monitoring her blood glucose levels sporadically. She is due for an eye exam. Patient has been advised to apply for financial assistance and schedule to see our financial counselor.  Lab Results  Component Value Date   HGBA1C 6.9 (A) 02/14/2018     CHRONIC HYPERTENSION Disease Monitoring  Blood pressure BP Readings from Last 3 Encounters:  02/14/18 (!) 143/93  01/22/18 (!) 136/95  12/25/17 139/79    Chest pain: no   Dyspnea: no   Claudication: no  Medication compliance: no    Medication Side Effects  Lightheadedness: no   Urinary frequency: no   Edema: no   Impotence: no  Preventitive Healthcare:  Exercise: no   Diet Pattern: diet: general  Salt Restriction:  no    Palpitation Was started on propanolol in the past for palpitations. Recently she has begun to experience increased palpitations with intermittent dizziness. Based on her last refill date of propranolol it does not appear she is taking her medication as prescribed. Denies any falls, loss of consciousness, chest pain or shortness of breath.  She has been advised to follow up with Dr. Kennon Holter office.     Review of Systems  Constitutional: Negative for fever, malaise/fatigue and weight loss.  HENT: Negative.  Negative for nosebleeds.   Eyes: Negative.  Negative for blurred vision, double vision and photophobia.  Respiratory: Negative.  Negative for cough and shortness of breath.   Cardiovascular: Positive for palpitations. Negative for chest pain and leg swelling.  Gastrointestinal: Negative.  Negative for heartburn, nausea and vomiting.  Genitourinary: Negative.   Musculoskeletal: Negative.  Negative for myalgias.  Neurological: Positive for dizziness. Negative for focal weakness, seizures and headaches.  Psychiatric/Behavioral: Negative.  Negative for suicidal ideas.    Past Medical History:  Diagnosis Date  . Diabetes mellitus without complication ([place])   . Hemorrhoids 11/18/2015  . Hypertension   . Left breast mass 09/01/2015  . 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 Palpitations  02/03/2016   PVCs seen on monitor  . Vertigo     Past Surgical History:  Procedure Laterality Date  . BREAST BIOPSY Left 09/03/2015   US Core bx, benign  . ESOPHAGOGASTRODUODENOSCOPY (EGD) WITH PROPOFOL N/A 06/21/2017   Procedure: ESOPHAGOGASTRODUODENOSCOPY (EGD) WITH PROPOFOL;  Surgeon: Vanga, Rohini Reddy, MD;  Location: [facility] ENDOSCOPY;  Service: Gastroenterology;  Laterality: N/A;     Family History  Problem Relation Age of Onset  . Hypertension Mother   . Lung cancer Mother   . Throat cancer Father   . Diabetes Sister   . Lupus Maternal Grandmother   . Stroke Maternal Grandfather   . Sudden Cardiac Death Daughter   . Breast cancer Paternal Aunt     Social History Reviewed with no changes to be made today.   Outpatient Medications Prior to Visit  Medication Sig Dispense Refill  . Blood Glucose Monitoring Suppl (TRUE METRIX METER) DEVI 1 each by Does not apply route 3 (three) times daily before meals. 1 Device 0  . glucose blood (RELION GLUCOSE TEST STRIPS) test strip Use as instructed 100 each 12  . Insulin Pen Needle (B-D UF III MINI PEN NEEDLES) 31G X 5 MM MISC Use as instructed 100 each 2  . Insulin Syringe-Needle U-100 (B-D INS SYRINGE 0.5CC/31GX5/16) 31G X 5/16" 0.5 ML MISC USE AS DIRECTED DAILY FOR INSULIN ADMINISTRATION 100 each 1  . metFORMIN (GLUCOPHAGE-XR) 500 MG 24 hr tablet Take 2 tablets (1,000 mg total) by mouth 2 (two) times daily. 120 tablet 2  . rosuvastatin (CRESTOR) 10 MG tablet Take 1 tablet (10 mg total) by mouth daily. 90 tablet 3  . insulin glargine (LANTUS) 100 unit/mL SOPN Inject 0.25 mLs (25 Units total) into the skin at bedtime. 15 mL 5  . prochlorperazine (COMPAZINE) 5 MG tablet Take 1 tablet (5 mg total) by mouth every 6 (six) hours as needed for nausea or vomiting. (Patient not taking: Reported on 02/14/2018) 10 tablet 0  . propranolol ER (INDERAL LA) 80 MG 24 hr capsule Take 1 capsule (80 mg total) by mouth daily. 90 capsule 1  . sulfamethoxazole-trimethoprim (BACTRIM DS) 800-160 MG tablet Take 1 tablet by mouth 2 (two) times daily. 20 tablet 0   No facility-administered medications prior to visit.     Allergies  Allergen Reactions  . Ace Inhibitors Shortness Of Breath    Reported by the patient after a trial of lisinopril prescribes by PCP  . Aspirin Anaphylaxis  . Rocephin [Ceftriaxone] Itching  . Ciprofloxacin Itching        Objective:    BP (!) 143/93 (BP Location: Right Arm, Patient Position: Sitting, Cuff Size: Large)   Pulse 76   Temp 98.3 F (36.8 C) (Oral)   Ht 5' 11" (1.803 m)   Wt 208 lb (94.3 kg)   SpO2 100%   BMI 29.01 kg/m  Wt Readings from Last 3 Encounters:  02/14/18 208 lb (94.3 kg)  01/22/18 203 lb (92.1 kg)  12/25/17 203 lb (92.1 kg)     BP Readings from Last 3 Encounters:  02/14/18 (!) 143/93  01/22/18 (!) 136/95  12/25/17 139/79   Physical Exam  Constitutional: She is oriented to person, place, and time. She appears well-developed and well-nourished. She is cooperative.  HENT:  Head: Normocephalic and atraumatic.  Eyes: EOM are normal.  Neck: Normal range of motion.  Cardiovascular: Normal rate, regular rhythm, normal heart sounds and intact distal pulses. Exam reveals no gallop and no friction rub.  No murmur   heard. Pulmonary/Chest: Effort normal and breath sounds normal. No tachypnea. No respiratory distress. She has no decreased breath sounds. She has no wheezes. She has no rhonchi. She has no rales. She exhibits no tenderness.  Abdominal: Soft. Bowel sounds are normal.  Musculoskeletal: Normal range of motion. She exhibits no edema.  Neurological: She is alert and oriented to person, place, and time. She has normal strength. No sensory deficit. Coordination and gait normal.  Skin: Skin is warm and dry.  Psychiatric: She has a normal mood and affect. Her speech is normal and behavior is normal. Judgment and thought content normal. Cognition and memory are normal.  Nursing note and vitals reviewed.        Patient has been counseled extensively about nutrition and exercise as well as the importance of adherence with medications and regular follow-up. The patient was given clear instructions to go to ER or return to medical center if symptoms don't improve, worsen or new problems develop. The patient verbalized understanding.   Follow-up: Return in about 3 months  (around 05/17/2018) for DM.   Zelda W Fleming, FNP-BC [facility] [place], [place] [phone]   02/19/2018, 8:47 PM 

## 2018-02-15 LAB — CMP14+EGFR
A/G RATIO: 1.5 (ref 1.2–2.2)
ALT: 17 IU/L (ref 0–32)
AST: 18 IU/L (ref 0–40)
Albumin: 4.5 g/dL (ref 3.5–5.5)
Alkaline Phosphatase: 56 IU/L (ref 39–117)
BUN/Creatinine Ratio: 10 (ref 9–23)
BUN: 8 mg/dL (ref 6–24)
CHLORIDE: 101 mmol/L (ref 96–106)
CO2: 19 mmol/L — ABNORMAL LOW (ref 20–29)
Calcium: 9.9 mg/dL (ref 8.7–10.2)
Creatinine, Ser: 0.78 mg/dL (ref 0.57–1.00)
GFR calc non Af Amer: 94 mL/min/{1.73_m2} (ref >59)
GFR, EST AFRICAN AMERICAN: 108 mL/min/{1.73_m2} (ref >59)
GLOBULIN, TOTAL: 3 g/dL (ref 1.5–4.5)
Glucose: 94 mg/dL (ref 65–99)
POTASSIUM: 4.2 mmol/L (ref 3.5–5.2)
SODIUM: 139 mmol/L (ref 134–144)
TOTAL PROTEIN: 7.5 g/dL (ref 6.0–8.5)

## 2018-02-15 LAB — MICROALBUMIN / CREATININE URINE RATIO
Creatinine, Urine: 104.6 mg/dL
MICROALB/CREAT RATIO: 17.4 mg/g{creat} (ref 0.0–30.0)
MICROALBUM., U, RANDOM: 18.2 ug/mL

## 2018-02-19 ENCOUNTER — Encounter: Payer: Self-pay | Admitting: Nurse Practitioner

## 2018-02-19 MED ORDER — PROPRANOLOL HCL ER 80 MG PO CP24: 80.0000 mg | ORAL_CAPSULE | Freq: Every day | ORAL | 1 refills | Status: DC

## 2018-03-13 MED FILL — PROPRANOLOL ER 80 MG CAP: 80 | 30 days supply | Qty: 30 | Fill #5

## 2018-03-13 MED FILL — $LANTUS SOLOSTAR 100 UNITS/: 100 | 84 days supply | Qty: 21 | Fill #7

## 2018-04-13 ENCOUNTER — Other Ambulatory Visit: Payer: Self-pay

## 2018-04-13 ENCOUNTER — Other Ambulatory Visit: Payer: Self-pay | Admitting: Nurse Practitioner

## 2018-04-13 ENCOUNTER — Encounter: Payer: Self-pay | Admitting: Emergency Medicine

## 2018-04-13 ENCOUNTER — Emergency Department
Admission: EM | Admit: 2018-04-13 | Discharge: 2018-04-13 | Disposition: A | Discharge status: Home / Self Care | Payer: Self-pay | Attending: Emergency Medicine | Admitting: Emergency Medicine

## 2018-04-13 ENCOUNTER — Telehealth: Payer: Self-pay | Admitting: Nurse Practitioner

## 2018-04-13 ENCOUNTER — Emergency Department: Payer: Self-pay

## 2018-04-13 DIAGNOSIS — N390 Urinary tract infection, site not specified: Secondary | ICD-10-CM | POA: Insufficient documentation

## 2018-04-13 DIAGNOSIS — I10 Essential (primary) hypertension: Secondary | ICD-10-CM

## 2018-04-13 DIAGNOSIS — Z794 Long term (current) use of insulin: Secondary | ICD-10-CM | POA: Insufficient documentation

## 2018-04-13 DIAGNOSIS — E119 Type 2 diabetes mellitus without complications: Secondary | ICD-10-CM | POA: Insufficient documentation

## 2018-04-13 DIAGNOSIS — Z79899 Other long term (current) drug therapy: Secondary | ICD-10-CM | POA: Insufficient documentation

## 2018-04-13 DIAGNOSIS — N200 Calculus of kidney: Secondary | ICD-10-CM | POA: Insufficient documentation

## 2018-04-13 LAB — CBC
HCT: 34.1 % — ABNORMAL LOW (ref 36.0–46.0)
HEMOGLOBIN: 10.7 g/dL — AB (ref 12.0–15.0)
MCH: 25.7 pg — AB (ref 26.0–34.0)
MCHC: 31.4 g/dL (ref 30.0–36.0)
MCV: 81.8 fL (ref 80.0–100.0)
PLATELETS: 436 10*3/uL — AB (ref 150–400)
RBC: 4.17 MIL/uL (ref 3.87–5.11)
RDW: 15.6 % — ABNORMAL HIGH (ref 11.5–15.5)
WBC: 11.3 10*3/uL — AB (ref 4.0–10.5)
nRBC: 0 % (ref 0.0–0.2)

## 2018-04-13 LAB — URINALYSIS, COMPLETE (UACMP) WITH MICROSCOPIC
Bilirubin Urine: NEGATIVE
Glucose, UA: NEGATIVE mg/dL
Hgb urine dipstick: NEGATIVE
KETONES UR: NEGATIVE mg/dL
Nitrite: POSITIVE — AB
PROTEIN: NEGATIVE mg/dL
Specific Gravity, Urine: 1.019 (ref 1.005–1.030)
pH: 6 (ref 5.0–8.0)

## 2018-04-13 LAB — BASIC METABOLIC PANEL
ANION GAP: 6 (ref 5–15)
BUN: 9 mg/dL (ref 6–20)
CALCIUM: 8.9 mg/dL (ref 8.9–10.3)
CO2: 21 mmol/L — ABNORMAL LOW (ref 22–32)
Chloride: 109 mmol/L (ref 98–111)
Creatinine, Ser: 0.75 mg/dL (ref 0.44–1.00)
GFR calc Af Amer: >60 mL/min (ref >60)
GLUCOSE: 147 mg/dL — AB (ref 70–99)
POTASSIUM: 3.6 mmol/L (ref 3.5–5.1)
SODIUM: 136 mmol/L (ref 135–145)

## 2018-04-13 LAB — POCT PREGNANCY, URINE: PREG TEST UR: NEGATIVE

## 2018-04-13 MED ORDER — SULFAMETHOXAZOLE-TRIMETHOPRIM 800-160 MG PO TABS: 1.0000 | ORAL_TABLET | Freq: Two times a day (BID) | ORAL | 0 refills | Status: DC

## 2018-04-13 MED ORDER — SULFAMETHOXAZOLE-TRIMETHOPRIM 800-160 MG PO TABS
1.0000 | ORAL_TABLET | Freq: Once | ORAL | Status: AC
  Administered 2018-04-13: 1 via ORAL
  Filled 2018-04-13: qty 1

## 2018-04-13 MED ORDER — TAMSULOSIN HCL 0.4 MG PO CAPS: 0.4000 mg | ORAL_CAPSULE | Freq: Every day | ORAL | 0 refills | Status: DC

## 2018-04-13 MED ORDER — OXYCODONE-ACETAMINOPHEN 5-325 MG PO TABS: 1.0000 | ORAL_TABLET | ORAL | 0 refills | Status: DC | PRN

## 2018-04-13 NOTE — ED Triage Notes (Signed)
First RN: Pt c/o R lower back pain and flank pain x 3 days. Pt states initially thought she slept wrong, however pain has not subsided.

## 2018-04-13 NOTE — ED Triage Notes (Signed)
Pt in via POV with complaints of worsening right flank pain x 3 days, reports some nausea, denies vomiting, denies any urinary symptoms.  Vitals WDL.  NAD noted at this time.

## 2018-04-13 NOTE — Telephone Encounter (Signed)
1) Medication(s) Requested (by name): propranolol  2) Pharmacy of Choice: [facility]  3) Special Requests: per pt no refills no pills remain.   Approved medications will be sent to the pharmacy, we will reach out if there is an issue.  Requests made after 3pm may not be addressed until the following business day!  If a patient is unsure of the name of the medication(s) please note and ask patient to call back when they are able to provide all info, do not send to responsible party until all information is available!

## 2018-04-13 NOTE — Telephone Encounter (Signed)
Pt had rx sent to [facility] in [place] on 02/19/18, [facility] pharmacy was notified to initiate transfer and fill RX

## 2018-04-13 NOTE — ED Provider Notes (Signed)
[facility] Emergency Department Provider Note  ____________________________________________   First MD Initiated Contact with Patient 04/13/18 1724     (approximate)  I have reviewed the triage vital signs and the nursing notes.   HISTORY  Chief Complaint Flank Pain   HPI Tonya Paul is a 43 y.o. female with a history of diabetes [place] hypertension and long QT who was presented emergency department today complaining of right flank pain over the past 3 days.  Says the pain is a 10 out of 10 and feels like an aching pain.  She also reports urinary frequency but no burning.  States that she has a history of kidney stones as well as UTIs.  No nausea vomiting or fever.   Past Medical History:  Diagnosis Date  . Diabetes mellitus without complication ([place])   . Hemorrhoids 11/18/2015  . Hypertension   . Left breast mass 09/01/2015  . 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 Palpitations 02/03/2016   PVCs seen on monitor  . Vertigo     Patient Active Problem List   Diagnosis Date Noted  . Abdominal pain, chronic, epigastric   . GERD (gastroesophageal reflux disease) 06/20/2017  . Long Q-T syndrome 01/14/2017  . Palpitations 02/03/2016  . Uterine fibroid 12/11/2015  . Family history of heart disease 12/03/2015  . Hemorrhoids 11/18/2015  . Left breast mass 09/01/2015  . Obesity 09/01/2015  . Type 2 diabetes mellitus, uncontrolled ([place]) 04/16/2015    Past Surgical History:  Procedure Laterality Date  . BREAST BIOPSY Left 09/03/2015   [place] Core bx, benign  . ESOPHAGOGASTRODUODENOSCOPY (EGD) WITH PROPOFOL N/A 06/21/2017   Procedure: ESOPHAGOGASTRODUODENOSCOPY (EGD) WITH PROPOFOL;  Surgeon: Lin Landsman, MD;  Location: [place];  Service: Gastroenterology;  Laterality: N/A;    Prior to Admission medications   Medication Sig Start Date End Date Taking? Authorizing Provider  Blood Glucose Monitoring Suppl (TRUE METRIX METER) DEVI  1 each by Does not apply route 3 (three) times daily before meals. 04/16/15   Charlott Rakes, MD  glucose blood (RELION GLUCOSE TEST STRIPS) test strip Use as instructed 07/30/16   Fredia Beets R, FNP  insulin glargine (LANTUS) 100 unit/mL SOPN Inject 0.25 mLs (25 Units total) into the skin at bedtime. 09/26/17 10/26/17  Gildardo Pounds, NP  Insulin Pen Needle (B-D UF III MINI PEN NEEDLES) 31G X 5 MM MISC Use as instructed 09/28/17   Gildardo Pounds, NP  Insulin Syringe-Needle U-100 (B-D INS SYRINGE 0.5CC/31GX5/16) 31G X 5/16" 0.5 ML MISC USE AS DIRECTED DAILY FOR INSULIN ADMINISTRATION 11/29/16   Alfonse Spruce, FNP  metFORMIN (GLUCOPHAGE-XR) 500 MG 24 hr tablet Take 2 tablets (1,000 mg total) by mouth 2 (two) times daily. 12/09/17   Gildardo Pounds, NP  propranolol ER (INDERAL LA) 80 MG 24 hr capsule Take 1 capsule (80 mg total) by mouth daily. 02/19/18 05/20/18  Gildardo Pounds, NP  rosuvastatin (CRESTOR) 10 MG tablet Take 1 tablet (10 mg total) by mouth daily. 08/22/17   Gildardo Pounds, NP    Allergies Ace inhibitors; Aspirin; Rocephin [ceftriaxone]; and Ciprofloxacin  Family History  Problem Relation Age of Onset  . Hypertension Mother   . Lung cancer Mother   . Throat cancer Father   . Diabetes Sister   . Lupus Maternal Grandmother   . Stroke Maternal Grandfather   . Sudden Cardiac Death Daughter   . Breast cancer Paternal Aunt     Social History Social  History   Tobacco Use  . Smoking status: Never Smoker  . Smokeless tobacco: Never Used  Substance Use Topics  . Alcohol use: No  . Drug use: No    Review of Systems  Constitutional: No fever/chills Eyes: No visual changes. ENT: No sore throat. Cardiovascular: Denies chest pain. Respiratory: Denies shortness of breath. Gastrointestinal: No abdominal pain.  No nausea, no vomiting.  No diarrhea.  No constipation. Genitourinary: As above Musculoskeletal: As above Skin: Negative for rash. Neurological: Negative  for headaches, focal weakness or numbness.   ____________________________________________   PHYSICAL EXAM:  VITAL SIGNS: ED Triage Vitals  Enc Vitals Group     BP 04/13/18 1659 (!) 159/97     Pulse Rate 04/13/18 1659 85     Resp 04/13/18 1659 16     Temp 04/13/18 1659 98.1 F (36.7 C)     Temp Source 04/13/18 1659 Oral     SpO2 04/13/18 1659 100 %     Weight 04/13/18 1701 178 lb (80.7 kg)     Height 04/13/18 1701 5\' 10"  (1.778 m)     Head Circumference --      Peak Flow --      Pain Score 04/13/18 1700 10     Pain Loc --      Pain Edu? --      Excl. in [place]? --     Constitutional: Alert and oriented. Well appearing and in no acute distress. Eyes: Conjunctivae are normal.  Head: Atraumatic. Nose: No congestion/rhinnorhea. Mouth/Throat: Mucous membranes are moist.  Neck: No stridor.   Cardiovascular: Normal rate, regular rhythm. Grossly normal heart sounds.   Respiratory: Normal respiratory effort.  No retractions. Lungs CTAB. Gastrointestinal: Soft and nontender. No distention.  Mild to moderate right CVA tenderness to palpation. Musculoskeletal: No lower extremity tenderness nor edema.  No joint effusions. Neurologic:  Normal speech and language. No gross focal neurologic deficits are appreciated. Skin:  Skin is warm, dry and intact. No rash noted. Psychiatric: Mood and affect are normal. Speech and behavior are normal.  ____________________________________________   LABS (all labs ordered are listed, but only abnormal results are displayed)  Labs Reviewed  URINALYSIS, COMPLETE (UACMP) WITH MICROSCOPIC - Abnormal; Notable for the following components:      Result Value   Color, Urine YELLOW (*)    APPearance CLOUDY (*)    Nitrite POSITIVE (*)    Leukocytes, UA SMALL (*)    Bacteria, UA RARE (*)    All other components within normal limits  BASIC METABOLIC PANEL - Abnormal; Notable for the following components:   CO2 21 (*)    Glucose, Bld 147 (*)    All other  components within normal limits  CBC - Abnormal; Notable for the following components:   WBC 11.3 (*)    Hemoglobin 10.7 (*)    HCT 34.1 (*)    MCH 25.7 (*)    RDW 15.6 (*)    Platelets 436 (*)    All other components within normal limits  URINE CULTURE  POC URINE PREG, ED  POCT PREGNANCY, URINE   ____________________________________________  EKG   ____________________________________________  RADIOLOGY  Punctate 2 to 3 mm calcification positioned along the expected region on the distal right ureter/right UVJ.  Suspicious for possible tiny stone. ____________________________________________   PROCEDURES  Procedure(s) performed:   Procedures  Critical Care performed:   ____________________________________________   INITIAL IMPRESSION / ASSESSMENT AND PLAN / ED COURSE  Pertinent labs & imaging results that were  available during my care of the patient were reviewed by me and considered in my medical decision making (see chart for details).  Differential diagnosis includes, but is not limited to, ovarian cyst, ovarian torsion, acute appendicitis, diverticulitis, urinary tract infection/pyelonephritis, endometriosis, bowel obstruction, colitis, renal colic, gastroenteritis, hernia, fibroids, endometriosis, pregnancy related pain including ectopic pregnancy, etc. As part of my medical decision making, I reviewed the following data within the electronic MEDICAL RECORD NUMBER Notes from prior ED visits  ----------------------------------------- 7:34 PM on 04/13/2018 -----------------------------------------  Patient at this time without any distress.  Had previously reported 10 out of 10 pain but refused pain meds.  Also discussed case with Dr. Caprice Beaver of urology who reviewed the patient's films and states that, like radiology, he is not sure if this is representing a stone. Reassuring vital signs.  However, there is no hydro-.  Recommend antibiotics Flomax as well as strainer.   I had a lengthy discussion regarding the CT results with the patient as well as the need for return to the emergency department for chills, fever or any worsening symptoms such as abdominal pain or flank pain.  She is understanding the diagnosis well treatment and willing to comply. Extra concern was taken with this patient because she is diabetic.  However, patient is very well-appearing.  Nontoxic.   ____________________________________________   FINAL CLINICAL IMPRESSION(S) / ED DIAGNOSES  UTI.  Kidney stone.  NEW MEDICATIONS STARTED DURING THIS VISIT:  New Prescriptions   No medications on file     Note:  This document was prepared using Dragon voice recognition software and may include unintentional dictation errors.     Orbie Pyo, MD 04/13/18 [phone]

## 2018-04-14 MED FILL — PROPRANOLOL ER 80 MG CAP: 80 | 30 days supply | Qty: 30 | Fill #0

## 2018-04-14 NOTE — Addendum Note (Signed)
Addended by: Billey Co on: 04/14/2018 04:05 PM   Modules accepted: Orders

## 2018-04-17 LAB — URINE CULTURE

## 2018-04-18 NOTE — Progress Notes (Signed)
ED Antimicrobial Stewardship Positive Culture Follow Up   Tonya Paul is an 43 y.o. female who presented to [facility] on 04/13/2018 with a chief complaint of  Chief Complaint  Patient presents with  . Flank Pain    Recent Results (from the past 720 hour(s))  Urine Culture     Status: Abnormal   Collection Time: 04/13/18  5:07 PM  Result Value Ref Range Status   Specimen Description URINE, RANDOM  Final   Special Requests   Final    NONE Performed at [facility], [address] [place], [place] [address]    Culture >=100,000 COLONIES/mL ESCHERICHIA COLI (A)  Final   Report Status 04/17/2018 FINAL  Final   Organism ID, Bacteria ESCHERICHIA COLI (A)  Final      Susceptibility   Escherichia coli - MIC*    AMPICILLIN >=32 RESISTANT Resistant     CEFAZOLIN <=4 SENSITIVE Sensitive     CEFTRIAXONE <=1 SENSITIVE Sensitive     CIPROFLOXACIN 0.5 SENSITIVE Sensitive     GENTAMICIN <=1 SENSITIVE Sensitive     IMIPENEM <=0.25 SENSITIVE Sensitive     NITROFURANTOIN <=16 SENSITIVE Sensitive     TRIMETH/SULFA >=320 RESISTANT Resistant     AMPICILLIN/SULBACTAM 16 INTERMEDIATE Intermediate     PIP/TAZO <=4 SENSITIVE Sensitive     Extended ESBL NEGATIVE Sensitive     * >=100,000 COLONIES/mL ESCHERICHIA COLI     [x]  Treated with Bactrim DS, organism resistant to prescribed antimicrobial   New antibiotic prescription: Nitrofurantoin 100mg  bid x 7 days  Phoned new abx in to [facility] in [place], [place] per pt request.  Left phone rx with [facility].  Spoke with phone and also advised that MD said to return to ED if pain worsened or did not improve.  ED Provider: Lenise Arena, MD   Lu Duffel, PharmD, BCPS Clinical Pharmacist 04/18/2018 2:28 PM

## 2018-04-24 MED FILL — METFORMIN HCL ER 500 MG TAB: 500 | 30 days supply | Qty: 120 | Fill #2

## 2018-04-27 ENCOUNTER — Ambulatory Visit: Payer: Self-pay

## 2018-04-30 ENCOUNTER — Other Ambulatory Visit: Payer: Self-pay

## 2018-04-30 DIAGNOSIS — R002 Palpitations: Secondary | ICD-10-CM | POA: Insufficient documentation

## 2018-04-30 DIAGNOSIS — I10 Essential (primary) hypertension: Secondary | ICD-10-CM | POA: Insufficient documentation

## 2018-04-30 DIAGNOSIS — E119 Type 2 diabetes mellitus without complications: Secondary | ICD-10-CM | POA: Insufficient documentation

## 2018-04-30 DIAGNOSIS — Z79899 Other long term (current) drug therapy: Secondary | ICD-10-CM | POA: Insufficient documentation

## 2018-04-30 DIAGNOSIS — Z7984 Long term (current) use of oral hypoglycemic drugs: Secondary | ICD-10-CM | POA: Insufficient documentation

## 2018-04-30 NOTE — ED Triage Notes (Addendum)
Pt states was at home, working on computer when she began to have one minute of "hard heartbeat" and sensation of palpitations. Pt appears in no acute distress. Pt with history of prolonged QT.

## 2018-05-01 ENCOUNTER — Ambulatory Visit: Payer: Self-pay | Admitting: Urology

## 2018-05-01 ENCOUNTER — Encounter: Payer: Self-pay | Admitting: Urology

## 2018-05-01 ENCOUNTER — Emergency Department
Admission: EM | Admit: 2018-05-01 | Discharge: 2018-05-01 | Disposition: A | Discharge status: Home / Self Care | Payer: Medicaid Other | Attending: Emergency Medicine | Admitting: Emergency Medicine

## 2018-05-01 DIAGNOSIS — R002 Palpitations: Secondary | ICD-10-CM

## 2018-05-01 LAB — COMPREHENSIVE METABOLIC PANEL
ALK PHOS: 48 U/L (ref 38–126)
ALT: 20 U/L (ref 0–44)
AST: 20 U/L (ref 15–41)
Albumin: 4.2 g/dL (ref 3.5–5.0)
Anion gap: 7 (ref 5–15)
BUN: 8 mg/dL (ref 6–20)
CO2: 24 mmol/L (ref 22–32)
Calcium: 9 mg/dL (ref 8.9–10.3)
Chloride: 105 mmol/L (ref 98–111)
Creatinine, Ser: 0.82 mg/dL (ref 0.44–1.00)
GFR calc Af Amer: >60 mL/min (ref >60)
GFR calc non Af Amer: >60 mL/min (ref >60)
Glucose, Bld: 102 mg/dL — ABNORMAL HIGH (ref 70–99)
Potassium: 3.5 mmol/L (ref 3.5–5.1)
Sodium: 136 mmol/L (ref 135–145)
Total Bilirubin: 0.4 mg/dL (ref 0.3–1.2)
Total Protein: 7.6 g/dL (ref 6.5–8.1)

## 2018-05-01 LAB — CBC
HEMATOCRIT: 35.4 % — AB (ref 36.0–46.0)
HEMOGLOBIN: 11.1 g/dL — AB (ref 12.0–15.0)
MCH: 26 pg (ref 26.0–34.0)
MCHC: 31.4 g/dL (ref 30.0–36.0)
MCV: 82.9 fL (ref 80.0–100.0)
Platelets: 471 10*3/uL — ABNORMAL HIGH (ref 150–400)
RBC: 4.27 MIL/uL (ref 3.87–5.11)
RDW: 15.6 % — ABNORMAL HIGH (ref 11.5–15.5)
WBC: 9 10*3/uL (ref 4.0–10.5)
nRBC: 0 % (ref 0.0–0.2)

## 2018-05-01 LAB — TROPONIN I: Troponin I: <0.03 ng/mL (ref <0.03)

## 2018-05-01 LAB — MAGNESIUM: Magnesium: 2.1 mg/dL (ref 1.7–2.4)

## 2018-05-01 NOTE — Discharge Instructions (Addendum)
Your workup in the Emergency Department today was reassuring.  We did not find any specific abnormalities.  We recommend you drink plenty of fluids, take your regular medications and/or any new ones prescribed today, and follow up with the doctor(s) listed in these documents as recommended.  Return to the Emergency Department if you develop new or worsening symptoms that concern you.  

## 2018-05-01 NOTE — ED Provider Notes (Signed)
[facility] Emergency Department Provider Note  ____________________________________________   First MD Initiated Contact with Patient 05/01/18 0038     (approximate)  I have reviewed the triage vital signs and the nursing notes.   HISTORY  Chief Complaint Palpitations    HPI Tonya Paul is a 43 y.o. female with medical history as listed below who presents for evaluation of palpitations.  She says that it is occurred at least 4 times of the course of the day.  She is used to feeling palpitations from time to time but usually not so many times during 1 day.  Nothing particular makes it better or worse and she describes it as severe but the episodes are brief.  She says that it is occurred a couple times just since she has been here.  She was concerned because of history of long QT syndrome.  She denies chest pain, shortness of breath, nausea, vomiting, abdominal pain, dizziness, and syncope.  She has not started any new medications recently except for tamsulosin for a kidney stone.  She does not drink caffeine and cannot think of anything else that might of change recently that would lead her to having increased episodes of palpitations or rapid heart rate.  Past Medical History:  Diagnosis Date  . Diabetes mellitus without complication ([place])   . Hemorrhoids 11/18/2015  . Hypertension   . Left breast mass 09/01/2015  . 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 Palpitations 02/03/2016   PVCs seen on monitor  . Vertigo     Patient Active Problem List   Diagnosis Date Noted  . Abdominal pain, chronic, epigastric   . GERD (gastroesophageal reflux disease) 06/20/2017  . Long Q-T syndrome 01/14/2017  . Palpitations 02/03/2016  . Uterine fibroid 12/11/2015  . Family history of heart disease 12/03/2015  . Hemorrhoids 11/18/2015  . Left breast mass 09/01/2015  . Obesity 09/01/2015  . Type 2 diabetes mellitus, uncontrolled ([place]) 04/16/2015     Past Surgical History:  Procedure Laterality Date  . BREAST BIOPSY Left 09/03/2015   [place] Core bx, benign  . ESOPHAGOGASTRODUODENOSCOPY (EGD) WITH PROPOFOL N/A 06/21/2017   Procedure: ESOPHAGOGASTRODUODENOSCOPY (EGD) WITH PROPOFOL;  Surgeon: Lin Landsman, MD;  Location: [place];  Service: Gastroenterology;  Laterality: N/A;    Prior to Admission medications   Medication Sig Start Date End Date Taking? Authorizing Provider  Blood Glucose Monitoring Suppl (TRUE METRIX METER) DEVI 1 each by Does not apply route 3 (three) times daily before meals. 04/16/15   Charlott Rakes, MD  glucose blood (RELION GLUCOSE TEST STRIPS) test strip Use as instructed 07/30/16   Fredia Beets R, FNP  insulin glargine (LANTUS) 100 unit/mL SOPN Inject 0.25 mLs (25 Units total) into the skin at bedtime. 09/26/17 10/26/17  Gildardo Pounds, NP  Insulin Pen Needle (B-D UF III MINI PEN NEEDLES) 31G X 5 MM MISC Use as instructed 09/28/17   Gildardo Pounds, NP  Insulin Syringe-Needle U-100 (B-D INS SYRINGE 0.5CC/31GX5/16) 31G X 5/16" 0.5 ML MISC USE AS DIRECTED DAILY FOR INSULIN ADMINISTRATION 11/29/16   Alfonse Spruce, FNP  metFORMIN (GLUCOPHAGE-XR) 500 MG 24 hr tablet Take 2 tablets (1,000 mg total) by mouth 2 (two) times daily. 12/09/17   Gildardo Pounds, NP  oxyCODONE-acetaminophen (PERCOCET) 5-325 MG tablet Take 1 tablet by mouth every 4 (four) hours as needed. 04/13/18 04/13/19  Orbie Pyo, MD  propranolol ER (INDERAL LA) 80 MG 24 hr capsule Take 1 capsule (  80 mg total) by mouth daily. 02/19/18 05/20/18  Gildardo Pounds, NP  rosuvastatin (CRESTOR) 10 MG tablet Take 1 tablet (10 mg total) by mouth daily. 08/22/17   Gildardo Pounds, NP  sulfamethoxazole-trimethoprim (BACTRIM DS,SEPTRA DS) 800-160 MG tablet Take 1 tablet by mouth 2 (two) times daily. 04/13/18   Schaevitz, Randall An, MD  tamsulosin (FLOMAX) 0.4 MG CAPS capsule Take 1 capsule (0.4 mg total) by mouth daily. 04/13/18   Orbie Pyo, MD    Allergies Ace inhibitors; Aspirin; Rocephin [ceftriaxone]; and Ciprofloxacin  Family History  Problem Relation Age of Onset  . Hypertension Mother   . Lung cancer Mother   . Throat cancer Father   . Diabetes Sister   . Lupus Maternal Grandmother   . Stroke Maternal Grandfather   . Sudden Cardiac Death Daughter   . Breast cancer Paternal Aunt     Social History Social History   Tobacco Use  . Smoking status: Never Smoker  . Smokeless tobacco: Never Used  Substance Use Topics  . Alcohol use: No  . Drug use: No    Review of Systems Constitutional: No fever/chills Eyes: No visual changes. ENT: No sore throat. Cardiovascular: Palpitations.  Denies chest pain. Respiratory: Denies shortness of breath. Gastrointestinal: No abdominal pain.  No nausea, no vomiting.  No diarrhea.  No constipation. Genitourinary: Negative for dysuria. Musculoskeletal: Negative for neck pain.  Negative for back pain. Integumentary: Negative for rash. Neurological: Negative for headaches, focal weakness or numbness.   ____________________________________________   PHYSICAL EXAM:  VITAL SIGNS: ED Triage Vitals [04/30/18 2327]  Enc Vitals Group     BP (!) 143/85     Pulse Rate 76     Resp 16     Temp 97.9 F (36.6 C)     Temp Source Oral     SpO2 100 %     Weight 83.9 kg (185 lb)     Height 1.778 m (5\' 10" )     Head Circumference      Peak Flow      Pain Score 0     Pain Loc      Pain Edu?      Excl. in [place]?     Constitutional: Alert and oriented. Well appearing and in no acute distress. Eyes: Conjunctivae are normal.  Head: Atraumatic. Nose: No congestion/rhinnorhea. Mouth/Throat: Mucous membranes are moist. Neck: No stridor.  No meningeal signs.   Cardiovascular: Normal rate, regular rhythm. Good peripheral circulation. Grossly normal heart sounds. Respiratory: Normal respiratory effort.  No retractions. Lungs CTAB. Gastrointestinal: Soft and nontender.  No distention.  Musculoskeletal: No lower extremity tenderness nor edema. No gross deformities of extremities. Neurologic:  Normal speech and language. No gross focal neurologic deficits are appreciated.  Skin:  Skin is warm, dry and intact. No rash noted. Psychiatric: Mood and affect are normal. Speech and behavior are normal.  ____________________________________________   LABS (all labs ordered are listed, but only abnormal results are displayed)  Labs Reviewed  CBC - Abnormal; Notable for the following components:      Result Value   Hemoglobin 11.1 (*)    HCT 35.4 (*)    RDW 15.6 (*)    Platelets 471 (*)    All other components within normal limits  COMPREHENSIVE METABOLIC PANEL - Abnormal; Notable for the following components:   Glucose, Bld 102 (*)    All other components within normal limits  TROPONIN I  MAGNESIUM   ____________________________________________  EKG  ED  ECG REPORT I, Hinda Kehr, the attending physician, personally viewed and interpreted this ECG.  Date: 04/30/2018 EKG Time: 23: 18 Rate: 71 Rhythm: normal sinus rhythm QRS Axis: normal Intervals: normal ST/T Wave abnormalities: normal Narrative Interpretation: no evidence of acute ischemia  ____________________________________________  RADIOLOGY   ED MD interpretation: No indication for imaging  Official radiology report(s): No results found.  ____________________________________________   PROCEDURES  Critical Care performed: No   Procedure(s) performed:   Procedures   ____________________________________________   INITIAL IMPRESSION / ASSESSMENT AND PLAN / ED COURSE  As part of my medical decision making, I reviewed the following data within the [facility] notes reviewed and incorporated, Labs reviewed , EKG interpreted , Old chart reviewed and Notes from prior ED visits    Differential diagnosis includes, but is not limited to, PVCs, SVT/AVNRT,  A. fib with RVR, electrolyte abnormality, metabolic abnormality, acute infection.  The patient is well-appearing and in no acute distress with normal vital signs.  Her EKG is reassuring with no evidence of any QT prolongation or PVCs or arrhythmia.  Lab work is all within normal limits including a normal comprehensive metabolic panel including magnesium, negative troponin, normal CBC.  I believe the patient most likely is experiencing some extra PVCs but not anything we are able to capture on the monitor during couple of hours of cardiac monitoring/observation and nothing on the EKG.  I provided reassurance and encouraged her to follow-up with her cardiologist.  She understands and agrees with the plan.  There is no indication for a repeat troponin at this time.  Clinical Course as of May 01 836  Mon May 01, 2018  0303 Patient has been asymptomatic in the emergency department has had no more palpitations.  Work-up was reassuring.  I gave my usual customary return precautions and she will follow-up with Dr. Caryl Comes.  She agrees with the plan.   [CF]    Clinical Course User Index [CF] Hinda Kehr, MD    ____________________________________________  FINAL CLINICAL IMPRESSION(S) / ED DIAGNOSES  Final diagnoses:  Palpitations     MEDICATIONS GIVEN DURING THIS VISIT:  Medications - No data to display   ED Discharge Orders    None       Note:  This document was prepared using Dragon voice recognition software and may include unintentional dictation errors.   Hinda Kehr, MD 05/01/18 [phone]

## 2018-05-01 NOTE — ED Notes (Signed)
No peripheral IV placed this visit.   Discharge instructions reviewed with patient. Questions fielded by this RN. Patient verbalizes understanding of instructions. Patient discharged home in stable condition per [place]. No acute distress noted at time of discharge.

## 2018-05-05 ENCOUNTER — Encounter: Payer: Self-pay | Admitting: Medical Oncology

## 2018-05-05 ENCOUNTER — Emergency Department: Payer: Self-pay

## 2018-05-05 ENCOUNTER — Emergency Department
Admission: EM | Admit: 2018-05-05 | Discharge: 2018-05-05 | Disposition: A | Discharge status: Home / Self Care | Payer: Self-pay | Attending: Emergency Medicine | Admitting: Emergency Medicine

## 2018-05-05 DIAGNOSIS — R109 Unspecified abdominal pain: Secondary | ICD-10-CM

## 2018-05-05 DIAGNOSIS — I10 Essential (primary) hypertension: Secondary | ICD-10-CM | POA: Insufficient documentation

## 2018-05-05 DIAGNOSIS — E119 Type 2 diabetes mellitus without complications: Secondary | ICD-10-CM | POA: Insufficient documentation

## 2018-05-05 DIAGNOSIS — R1032 Left lower quadrant pain: Secondary | ICD-10-CM | POA: Insufficient documentation

## 2018-05-05 DIAGNOSIS — Z794 Long term (current) use of insulin: Secondary | ICD-10-CM | POA: Insufficient documentation

## 2018-05-05 DIAGNOSIS — Z79899 Other long term (current) drug therapy: Secondary | ICD-10-CM | POA: Insufficient documentation

## 2018-05-05 LAB — URINALYSIS, COMPLETE (UACMP) WITH MICROSCOPIC
BILIRUBIN URINE: NEGATIVE
Bacteria, UA: NONE SEEN
Glucose, UA: NEGATIVE mg/dL
Hgb urine dipstick: NEGATIVE
Ketones, ur: NEGATIVE mg/dL
Nitrite: NEGATIVE
Protein, ur: NEGATIVE mg/dL
Specific Gravity, Urine: 1.021 (ref 1.005–1.030)
pH: 6 (ref 5.0–8.0)

## 2018-05-05 LAB — CBC WITH DIFFERENTIAL/PLATELET
Abs Immature Granulocytes: 0.02 10*3/uL (ref 0.00–0.07)
Basophils Absolute: 0 10*3/uL (ref 0.0–0.1)
Basophils Relative: 0 %
EOS PCT: 5 %
Eosinophils Absolute: 0.4 10*3/uL (ref 0.0–0.5)
HEMATOCRIT: 36.3 % (ref 36.0–46.0)
Hemoglobin: 11.3 g/dL — ABNORMAL LOW (ref 12.0–15.0)
Immature Granulocytes: 0 %
Lymphocytes Relative: 34 %
Lymphs Abs: 2.6 10*3/uL (ref 0.7–4.0)
MCH: 25.8 pg — ABNORMAL LOW (ref 26.0–34.0)
MCHC: 31.1 g/dL (ref 30.0–36.0)
MCV: 82.9 fL (ref 80.0–100.0)
Monocytes Absolute: 0.8 10*3/uL (ref 0.1–1.0)
Monocytes Relative: 10 %
Neutro Abs: 3.8 10*3/uL (ref 1.7–7.7)
Neutrophils Relative %: 51 %
Platelets: 434 10*3/uL — ABNORMAL HIGH (ref 150–400)
RBC: 4.38 MIL/uL (ref 3.87–5.11)
RDW: 15.5 % (ref 11.5–15.5)
WBC: 7.6 10*3/uL (ref 4.0–10.5)
nRBC: 0 % (ref 0.0–0.2)

## 2018-05-05 LAB — BASIC METABOLIC PANEL
Anion gap: 6 (ref 5–15)
BUN: 11 mg/dL (ref 6–20)
CO2: 23 mmol/L (ref 22–32)
Calcium: 9.2 mg/dL (ref 8.9–10.3)
Chloride: 108 mmol/L (ref 98–111)
Creatinine, Ser: 0.79 mg/dL (ref 0.44–1.00)
GFR calc non Af Amer: >60 mL/min (ref >60)
Glucose, Bld: 141 mg/dL — ABNORMAL HIGH (ref 70–99)
Potassium: 3.8 mmol/L (ref 3.5–5.1)
Sodium: 137 mmol/L (ref 135–145)

## 2018-05-05 LAB — PREGNANCY, URINE: Preg Test, Ur: NEGATIVE

## 2018-05-05 MED ORDER — IBUPROFEN 600 MG PO TABS: 600.0000 mg | ORAL_TABLET | Freq: Four times a day (QID) | ORAL | 0 refills | Status: DC | PRN

## 2018-05-05 MED ORDER — HYDROCODONE-ACETAMINOPHEN 5-325 MG PO TABS: 1.0000 | ORAL_TABLET | Freq: Four times a day (QID) | ORAL | 0 refills | Status: AC | PRN

## 2018-05-05 MED ORDER — KETOROLAC TROMETHAMINE 30 MG/ML IJ SOLN
30.0000 mg | Freq: Once | INTRAMUSCULAR | Status: AC
  Administered 2018-05-05: 30 mg via INTRAMUSCULAR
  Filled 2018-05-05: qty 1

## 2018-05-05 NOTE — ED Notes (Addendum)
U/S completed, Patient sleeping awakened from sleep to evaluate pain level. awaiting results and plan of care. VSS left side lower back pain 7/10.  Call light with in reach family at bedside. will monitor.

## 2018-05-05 NOTE — ED Provider Notes (Signed)
[facility] Emergency Department Provider Note ____________________________________________   First MD Initiated Contact with Patient 05/05/18 [phone]     (approximate)  I have reviewed the triage vital signs and the nursing notes.   HISTORY  Chief Complaint Flank Pain and Abdominal Pain    HPI Tonya Paul is a 43 y.o. female with PMH as noted below who presents with left flank pain, acute onset 2 days ago, radiating around to the left lower quadrant, and associated with nausea as well as with dark urine.  The patient denies fever chills, vomiting, dysuria, or diarrhea.  The patient states it feels similar to when she has had kidney stones in the past including a few weeks ago.   Past Medical History:  Diagnosis Date  . Diabetes mellitus without complication ([place])   . Hemorrhoids 11/18/2015  . Hypertension   . Left breast mass 09/01/2015  . 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 Palpitations 02/03/2016   PVCs seen on monitor  . Vertigo     Patient Active Problem List   Diagnosis Date Noted  . Abdominal pain, chronic, epigastric   . GERD (gastroesophageal reflux disease) 06/20/2017  . Long Q-T syndrome 01/14/2017  . Palpitations 02/03/2016  . Uterine fibroid 12/11/2015  . Family history of heart disease 12/03/2015  . Hemorrhoids 11/18/2015  . Left breast mass 09/01/2015  . Obesity 09/01/2015  . Type 2 diabetes mellitus, uncontrolled ([place]) 04/16/2015    Past Surgical History:  Procedure Laterality Date  . BREAST BIOPSY Left 09/03/2015   [place] Core bx, benign  . ESOPHAGOGASTRODUODENOSCOPY (EGD) WITH PROPOFOL N/A 06/21/2017   Procedure: ESOPHAGOGASTRODUODENOSCOPY (EGD) WITH PROPOFOL;  Surgeon: Lin Landsman, MD;  Location: [place];  Service: Gastroenterology;  Laterality: N/A;    Prior to Admission medications   Medication Sig Start Date End Date Taking? Authorizing Provider  LANTUS SOLOSTAR 100 UNIT/ML Solostar Pen  Inject 25 Units into the skin at bedtime. 12/27/17  Yes [provider]  metFORMIN (GLUCOPHAGE-XR) 500 MG 24 hr tablet Take 2 tablets (1,000 mg total) by mouth 2 (two) times daily. 12/09/17  Yes Gildardo Pounds, NP  propranolol ER (INDERAL LA) 80 MG 24 hr capsule Take 1 capsule (80 mg total) by mouth daily. 02/19/18 05/20/18 Yes Gildardo Pounds, NP  tamsulosin (FLOMAX) 0.4 MG CAPS capsule Take 1 capsule (0.4 mg total) by mouth daily. 04/13/18  Yes Schaevitz, Randall An, MD  Blood Glucose Monitoring Suppl (TRUE METRIX METER) DEVI 1 each by Does not apply route 3 (three) times daily before meals. 04/16/15   Charlott Rakes, MD  glucose blood (RELION GLUCOSE TEST STRIPS) test strip Use as instructed 07/30/16   Alfonse Spruce, FNP  HYDROcodone-acetaminophen (NORCO/VICODIN) 5-325 MG tablet Take 1 tablet by mouth every 6 (six) hours as needed for up to 5 days for moderate pain. 05/05/18 05/10/18  Arta Silence, MD  ibuprofen (ADVIL,MOTRIN) 600 MG tablet Take 1 tablet (600 mg total) by mouth every 6 (six) hours as needed. 05/05/18   Arta Silence, MD  Insulin Pen Needle (B-D UF III MINI PEN NEEDLES) 31G X 5 MM MISC Use as instructed 09/28/17   Gildardo Pounds, NP  Insulin Syringe-Needle U-100 (B-D INS SYRINGE 0.5CC/31GX5/16) 31G X 5/16" 0.5 ML MISC USE AS DIRECTED DAILY FOR INSULIN ADMINISTRATION 11/29/16   Alfonse Spruce, FNP  rosuvastatin (CRESTOR) 10 MG tablet Take 1 tablet (10 mg total) by mouth daily. Patient not taking: Reported on 05/05/2018 08/22/17  Gildardo Pounds, NP  sulfamethoxazole-trimethoprim (BACTRIM DS,SEPTRA DS) 800-160 MG tablet Take 1 tablet by mouth 2 (two) times daily. Patient not taking: Reported on 05/05/2018 04/13/18   Orbie Pyo, MD    Allergies Ace inhibitors; Aspirin; Rocephin [ceftriaxone]; and Ciprofloxacin  Family History  Problem Relation Age of Onset  . Hypertension Mother   . Lung cancer Mother   . Throat cancer Father   .  Diabetes Sister   . Lupus Maternal Grandmother   . Stroke Maternal Grandfather   . Sudden Cardiac Death Daughter   . Breast cancer Paternal Aunt     Social History Social History   Tobacco Use  . Smoking status: Never Smoker  . Smokeless tobacco: Never Used  Substance Use Topics  . Alcohol use: No  . Drug use: No    Review of Systems  Constitutional: No fever/chills.  Eyes: No redness. ENT: No sore throat. Cardiovascular: Denies chest pain. Respiratory: Denies shortness of breath. Gastrointestinal: Positive for nausea. Genitourinary: Negative for dysuria.  Positive for flank pain. Musculoskeletal: Positive for back pain. Skin: Negative for rash. Neurological: Negative for headache.   ____________________________________________   PHYSICAL EXAM:  VITAL SIGNS: ED Triage Vitals  Enc Vitals Group     BP 05/05/18 0841 (!) 138/95     Pulse Rate 05/05/18 0841 81     Resp 05/05/18 0841 16     Temp 05/05/18 0841 98.4 F (36.9 C)     Temp Source 05/05/18 0841 Oral     SpO2 05/05/18 0841 100 %     Weight 05/05/18 0845 182 lb 15.7 oz (83 kg)     Height 05/05/18 0845 5\' 10"  (1.778 m)     Head Circumference --      Peak Flow --      Pain Score 05/05/18 0845 10     Pain Loc --      Pain Edu? --      Excl. in [place]? --     Constitutional: Alert and oriented.  Slightly uncomfortable appearing but in no acute distress. Eyes: Conjunctivae are normal.  Head: Atraumatic. Nose: No congestion/rhinnorhea. Mouth/Throat: Mucous membranes are moist.   Neck: Normal range of motion.  Cardiovascular: Good peripheral circulation. Respiratory: Normal respiratory effort.   Gastrointestinal: Soft and nontender. No distention.  Genitourinary: Mild left CVA tenderness. Musculoskeletal:  Extremities warm and well perfused.  Neurologic:  Normal speech and language. No gross focal neurologic deficits are appreciated.  Skin:  Skin is warm and dry. No rash noted. Psychiatric: Mood and  affect are normal. Speech and behavior are normal.  ____________________________________________   LABS (all labs ordered are listed, but only abnormal results are displayed)  Labs Reviewed  URINALYSIS, COMPLETE (UACMP) WITH MICROSCOPIC - Abnormal; Notable for the following components:      Result Value   Color, Urine YELLOW (*)    APPearance CLOUDY (*)    Leukocytes,Ua SMALL (*)    Squamous Epithelial / LPF >50 (*)    All other components within normal limits  BASIC METABOLIC PANEL - Abnormal; Notable for the following components:   Glucose, Bld 141 (*)    All other components within normal limits  CBC WITH DIFFERENTIAL/PLATELET - Abnormal; Notable for the following components:   Hemoglobin 11.3 (*)    MCH 25.8 (*)    Platelets 434 (*)    All other components within normal limits  PREGNANCY, URINE   ____________________________________________  EKG   ____________________________________________  RADIOLOGY  US renal: No hydronephrosis  or other acute abnormality  ____________________________________________   PROCEDURES  Procedure(s) performed: No  Procedures  Critical Care performed: No ____________________________________________   INITIAL IMPRESSION / ASSESSMENT AND PLAN / ED COURSE  Pertinent labs & imaging results that were available during my care of the patient were reviewed by me and considered in my medical decision making (see chart for details).  43 year old female with PMH as noted above presents with acute onset of left flank pain over the last 2 days with nausea and dark urine but no dysuria.  I reviewed the past medical records in [place].  The patient was seen in the ED on 04/13/2018 with right flank pain.  She had CT at that time showing possible 2 mm stone at the UVJ.  She also had urinalysis consistent with a UTI and was started on Bactrim.  Subsequent cultures revealed that the UTI was resistant to Bactrim so the antibiotic was switched and the  patient states she completed the course.  On exam today the patient is slightly uncomfortable but relatively well-appearing and her vital signs are normal.  The abdomen is soft and nontender.  She does have some left flank and CVA tenderness.  Given the relatively acute onset, presentation is overall most consistent with ureteral stone.  Differential also includes UTI/early pyelonephritis.  We will obtain UA and basic labs.  Since this is a recurrent presentation after such a short time, I would like to obtain imaging.  However, I want to avoid unnecessary radiation exposure in this relatively young patient.  Based on discussion with the patient we will obtain ultrasound to rule out significant hydronephrosis.  If there are concerning findings on the ultrasound we may then need to proceed with CT.  ----------------------------------------- 12:35 PM on 05/05/2018 -----------------------------------------  The UA, basic labs, and ultrasound are all reassuring.  The patient's pain is significantly improved.  She feels comfortable to go home.  At this time she is stable for discharge.  I suspect most likely a small ureteral stone which could have already passed.  There is no evidence of other concerning etiology of the patient's symptoms.  I counseled her on the results of the work-up and on return precautions.  She expresses understanding. ____________________________________________   FINAL CLINICAL IMPRESSION(S) / ED DIAGNOSES  Final diagnoses:  Left flank pain      NEW MEDICATIONS STARTED DURING THIS VISIT:  New Prescriptions   HYDROCODONE-ACETAMINOPHEN (NORCO/VICODIN) 5-325 MG TABLET    Take 1 tablet by mouth every 6 (six) hours as needed for up to 5 days for moderate pain.   IBUPROFEN (ADVIL,MOTRIN) 600 MG TABLET    Take 1 tablet (600 mg total) by mouth every 6 (six) hours as needed.     Note:  This document was prepared using Dragon voice recognition software and may include  unintentional dictation errors.    Arta Silence, MD 05/05/18 1236

## 2018-05-05 NOTE — ED Triage Notes (Signed)
Pt reports left flank pain that radiates into abdomen that began 2 days ago. Has hx of kidney stones. Reports feels similar. Denies dysuria or fever.

## 2018-05-05 NOTE — Discharge Instructions (Addendum)
Take the ibuprofen as the primary medicine for pain, and the hydrocodone only if needed for more severe pain.  Return to the ER for new, worsening, or persistent severe flank or abdominal pain, vomiting, fever, weakness, or any other new or worsening symptoms that concern you.

## 2018-05-11 ENCOUNTER — Emergency Department
Admission: EM | Admit: 2018-05-11 | Discharge: 2018-05-11 | Disposition: A | Discharge status: Home / Self Care | Payer: Medicaid Other | Attending: Emergency Medicine | Admitting: Emergency Medicine

## 2018-05-11 ENCOUNTER — Other Ambulatory Visit: Payer: Self-pay

## 2018-05-11 ENCOUNTER — Encounter: Payer: Self-pay | Admitting: *Deleted

## 2018-05-11 DIAGNOSIS — Z5321 Procedure and treatment not carried out due to patient leaving prior to being seen by health care provider: Secondary | ICD-10-CM | POA: Insufficient documentation

## 2018-05-11 DIAGNOSIS — R109 Unspecified abdominal pain: Secondary | ICD-10-CM | POA: Insufficient documentation

## 2018-05-11 LAB — URINALYSIS, COMPLETE (UACMP) WITH MICROSCOPIC
BACTERIA UA: NONE SEEN
Bilirubin Urine: NEGATIVE
Glucose, UA: 150 mg/dL — AB
Hgb urine dipstick: NEGATIVE
Ketones, ur: NEGATIVE mg/dL
Nitrite: NEGATIVE
Protein, ur: NEGATIVE mg/dL
Specific Gravity, Urine: 1.018 (ref 1.005–1.030)
pH: 6 (ref 5.0–8.0)

## 2018-05-11 LAB — BASIC METABOLIC PANEL
Anion gap: 6 (ref 5–15)
BUN: 12 mg/dL (ref 6–20)
CO2: 18 mmol/L — ABNORMAL LOW (ref 22–32)
Calcium: 9 mg/dL (ref 8.9–10.3)
Chloride: 109 mmol/L (ref 98–111)
Creatinine, Ser: 0.74 mg/dL (ref 0.44–1.00)
GFR calc non Af Amer: >60 mL/min (ref >60)
Glucose, Bld: 214 mg/dL — ABNORMAL HIGH (ref 70–99)
Potassium: 3.9 mmol/L (ref 3.5–5.1)
SODIUM: 133 mmol/L — AB (ref 135–145)

## 2018-05-11 LAB — CBC
HCT: 34.6 % — ABNORMAL LOW (ref 36.0–46.0)
Hemoglobin: 11 g/dL — ABNORMAL LOW (ref 12.0–15.0)
MCH: 26.3 pg (ref 26.0–34.0)
MCHC: 31.8 g/dL (ref 30.0–36.0)
MCV: 82.8 fL (ref 80.0–100.0)
NRBC: 0 % (ref 0.0–0.2)
Platelets: 441 10*3/uL — ABNORMAL HIGH (ref 150–400)
RBC: 4.18 MIL/uL (ref 3.87–5.11)
RDW: 15.6 % — ABNORMAL HIGH (ref 11.5–15.5)
WBC: 6.7 10*3/uL (ref 4.0–10.5)

## 2018-05-11 NOTE — ED Notes (Signed)
Pt states she does not want to stay for evaluation. Pt encouraged to stay and be seen. Pt states she is in pain - offered pain medication per acute pain protocol, pt refused. States she just wants to go home and follow up outpatient. Pt left prior to signing AMA form.

## 2018-05-11 NOTE — ED Triage Notes (Signed)
PT back to ED reporting continued left sided flank pain. PT reports she found on MyChart that she had a renal cyst and verbalized no MD had told her that. NO changes in urine but pain has worsened over this past week and will radiate to her umbilicus. No fevers but cold chills constantly.   Pt also reporting a Kidney stone on the right side. No pain to right flank any longer.

## 2018-05-15 ENCOUNTER — Other Ambulatory Visit: Payer: Self-pay

## 2018-05-15 ENCOUNTER — Ambulatory Visit: Payer: Self-pay | Attending: Nurse Practitioner | Admitting: Nurse Practitioner

## 2018-05-15 ENCOUNTER — Encounter: Payer: Self-pay | Admitting: Nurse Practitioner

## 2018-05-15 VITALS — BP 144/85 | HR 73 | Temp 98.3°F | Ht 71.0 in | Wt 204.4 lb

## 2018-05-15 DIAGNOSIS — E1165 Type 2 diabetes mellitus with hyperglycemia: Secondary | ICD-10-CM

## 2018-05-15 DIAGNOSIS — R002 Palpitations: Secondary | ICD-10-CM

## 2018-05-15 LAB — GLUCOSE, POCT (MANUAL RESULT ENTRY): POC Glucose: 89 mg/dl (ref 70–99)

## 2018-05-15 LAB — POCT GLYCOSYLATED HEMOGLOBIN (HGB A1C): Hemoglobin A1C: 6.7 % — AB (ref 4.0–5.6)

## 2018-05-15 MED ORDER — ROSUVASTATIN CALCIUM 5 MG PO TABS: 5.0000 mg | ORAL_TABLET | Freq: Every day | ORAL | 1 refills | Status: DC

## 2018-05-15 MED ORDER — HYDROXYZINE HCL 25 MG PO TABS: 25.0000 mg | ORAL_TABLET | Freq: Three times a day (TID) | ORAL | 0 refills | Status: AC | PRN

## 2018-05-15 MED FILL — PROPRANOLOL ER 80 MG CAP: 80 | 30 days supply | Qty: 30 | Fill #1

## 2018-05-15 NOTE — Progress Notes (Signed)
Assessment & Plan:  Denelle was seen today for follow-up.  Diagnoses and all orders for this visit:  Uncontrolled type 2 diabetes mellitus with hyperglycemia (HCC) -     Glucose (CBG) -     HgB A1c -     rosuvastatin (CRESTOR) 5 MG tablet; Take 1 tablet (5 mg total) by mouth daily.  Palpitations -     hydrOXYzine (ATARAX/VISTARIL) 25 MG tablet; Take 1 tablet (25 mg total) by mouth 3 (three) times daily as needed for up to 30 days.    Patient has been counseled on age-appropriate routine health concerns for screening and prevention. These are reviewed and up-to-date. Referrals have been placed accordingly. Immunizations are up-to-date or declined.    Subjective:   Chief Complaint  Patient presents with  . Follow-up    Pt. is here for 3 month follow-up on DM.    HPI Tonya Paul 43 y.o. female presents to office today for follow up. She was referred to the breast clinic for mammogram today.   DM TYPE 2 Chronic and well controlled. Monitoring blood glucose levels twice per day. Average fasting 90-100s, post prandial as high as 250. We discussed dietary and exercise modifications today. She denies any hypo or hyperglycemic symptoms.  Lab Results  Component Value Date   HGBA1C 6.9 (A) 02/14/2018   Lab Results  Component Value Date   HGBA1C 6.7 (A) 05/15/2018   Palpitations Patient complains of palpitations and and symptoms of anxiety: shortness of breath, racing thoughts, irritablity. .  The symptoms are of mild and moderate severity, occuring intermittently and lasting seconds, minutes to hours.  Cardiac risk factors include: none. Aggravating factors: stress/anxiety. Relieving factors: spontaneous, beta blocker. Associated signs and symptoms: has no complaint(s) of chest pain, chest pressure/discomfort, dyspnea, irregular heart beat and tachypnea  She is currently taking propranolol which provides some relief but states she also has increased  irritability/agitation.   Review of Systems  Constitutional: Negative for fever, malaise/fatigue and weight loss.  HENT: Negative.  Negative for nosebleeds.   Eyes: Negative.  Negative for blurred vision, double vision and photophobia.  Respiratory: Negative.  Negative for cough and shortness of breath.   Cardiovascular: Positive for palpitations. Negative for chest pain and leg swelling.  Gastrointestinal: Negative.  Negative for heartburn, nausea and vomiting.  Musculoskeletal: Negative.  Negative for myalgias.  Neurological: Negative.  Negative for dizziness, focal weakness, seizures and headaches.  Psychiatric/Behavioral: Negative for suicidal ideas. The patient is nervous/anxious.     Past Medical History:  Diagnosis Date  . Diabetes mellitus without complication ([place])   . Hemorrhoids 11/18/2015  . Hypertension   . Left breast mass 09/01/2015  . 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 Palpitations 02/03/2016   PVCs seen on monitor  . Vertigo     Past Surgical History:  Procedure Laterality Date  . BREAST BIOPSY Left 09/03/2015   [place] Core bx, benign  . ESOPHAGOGASTRODUODENOSCOPY (EGD) WITH PROPOFOL N/A 06/21/2017   Procedure: ESOPHAGOGASTRODUODENOSCOPY (EGD) WITH PROPOFOL;  Surgeon: Lin Landsman, MD;  Location: [facility];  Service: Gastroenterology;  Laterality: N/A;    Family History  Problem Relation Age of Onset  . Hypertension Mother   . Lung cancer Mother   . Throat cancer Father   . Diabetes Sister   . Lupus Maternal Grandmother   . Stroke Maternal Grandfather   . Sudden Cardiac Death Daughter   . Breast cancer Paternal Aunt     Social History Reviewed  with no changes to be made today.   Outpatient Medications Prior to Visit  Medication Sig Dispense Refill  . Blood Glucose Monitoring Suppl (TRUE METRIX METER) DEVI 1 each by Does not apply route 3 (three) times daily before meals. 1 Device 0  . glucose blood (RELION GLUCOSE TEST STRIPS)  test strip Use as instructed 100 each 12  . ibuprofen (ADVIL,MOTRIN) 600 MG tablet Take 1 tablet (600 mg total) by mouth every 6 (six) hours as needed. 30 tablet 0  . Insulin Pen Needle (B-D UF III MINI PEN NEEDLES) 31G X 5 MM MISC Use as instructed 100 each 2  . Insulin Syringe-Needle U-100 (B-D INS SYRINGE 0.5CC/31GX5/16) 31G X 5/16" 0.5 ML MISC USE AS DIRECTED DAILY FOR INSULIN ADMINISTRATION 100 each 1  . LANTUS SOLOSTAR 100 UNIT/ML Solostar Pen Inject 25 Units into the skin at bedtime.    . metFORMIN (GLUCOPHAGE-XR) 500 MG 24 hr tablet Take 2 tablets (1,000 mg total) by mouth 2 (two) times daily. 120 tablet 2  . propranolol ER (INDERAL LA) 80 MG 24 hr capsule Take 1 capsule (80 mg total) by mouth daily. 90 capsule 1  . tamsulosin (FLOMAX) 0.4 MG CAPS capsule Take 1 capsule (0.4 mg total) by mouth daily. 30 capsule 0  . rosuvastatin (CRESTOR) 10 MG tablet Take 1 tablet (10 mg total) by mouth daily. (Patient not taking: Reported on 05/05/2018) 90 tablet 3  . sulfamethoxazole-trimethoprim (BACTRIM DS,SEPTRA DS) 800-160 MG tablet Take 1 tablet by mouth 2 (two) times daily. (Patient not taking: Reported on 05/05/2018) 10 tablet 0   No facility-administered medications prior to visit.     Allergies  Allergen Reactions  . Ace Inhibitors Shortness Of Breath    Reported by the patient after a trial of lisinopril prescribes by PCP  . Aspirin Anaphylaxis  . Rocephin [Ceftriaxone] Itching  . Ciprofloxacin Itching       Objective:    BP (!) 144/85 (BP Location: Right Arm, Patient Position: Sitting, Cuff Size: Large)   Pulse 73   Temp 98.3 F (36.8 C) (Oral)   Ht 5\' 11"  (1.803 m)   Wt 204 lb 6.4 oz (92.7 kg)   LMP 04/25/2018   SpO2 98%   BMI 28.51 kg/m   Wt Readings from Last 3 Encounters:  05/15/18 204 lb 6.4 oz (92.7 kg)  05/11/18 185 lb (83.9 kg)  05/05/18 182 lb 15.7 oz (83 kg)    Physical Exam Vitals signs and nursing note reviewed.  Constitutional:      Appearance: She is  well-developed.  HENT:     Head: Normocephalic and atraumatic.  Neck:     Musculoskeletal: Normal range of motion.  Cardiovascular:     Rate and Rhythm: Normal rate and regular rhythm.     Heart sounds: Normal heart sounds. No murmur. No friction rub. No gallop.   Pulmonary:     Effort: Pulmonary effort is normal. No tachypnea or respiratory distress.     Breath sounds: Normal breath sounds. No decreased breath sounds, wheezing, rhonchi or rales.  Chest:     Chest wall: No tenderness.  Abdominal:     General: Bowel sounds are normal.     Palpations: Abdomen is soft.  Musculoskeletal: Normal range of motion.  Skin:    General: Skin is warm and dry.  Neurological:     Mental Status: She is alert and oriented to person, place, and time.     Coordination: Coordination normal.  Psychiatric:  Behavior: Behavior normal. Behavior is cooperative.        Thought Content: Thought content normal.        Judgment: Judgment normal.          Patient has been counseled extensively about nutrition and exercise as well as the importance of adherence with medications and regular follow-up. The patient was given clear instructions to go to ER or return to medical center if symptoms don't improve, worsen or new problems develop. The patient verbalized understanding.   Follow-up: Return for Needs appointment with financial representative. F/U in 6 months.   Gildardo Pounds, FNP-BC [facility] and [facility] [place], [place]   05/18/2018, 9:56 AM

## 2018-05-17 ENCOUNTER — Ambulatory Visit: Payer: Medicaid Other | Admitting: Nurse Practitioner

## 2018-05-18 ENCOUNTER — Encounter: Payer: Self-pay | Admitting: Nurse Practitioner

## 2018-05-24 ENCOUNTER — Telehealth (HOSPITAL_COMMUNITY): Payer: Self-pay

## 2018-05-24 NOTE — Telephone Encounter (Signed)
Left message about mammo scholarship. Left name and number for her to call back.

## 2018-05-25 ENCOUNTER — Other Ambulatory Visit: Payer: Self-pay | Admitting: Nurse Practitioner

## 2018-05-25 DIAGNOSIS — Z1231 Encounter for screening mammogram for malignant neoplasm of breast: Secondary | ICD-10-CM

## 2018-06-05 ENCOUNTER — Other Ambulatory Visit: Payer: Self-pay | Admitting: Nurse Practitioner

## 2018-06-05 DIAGNOSIS — E1165 Type 2 diabetes mellitus with hyperglycemia: Secondary | ICD-10-CM

## 2018-06-05 MED FILL — $LANTUS SOLOSTAR 100 UNITS/: 100 | 84 days supply | Qty: 21 | Fill #8

## 2018-06-05 MED FILL — PROPRANOLOL ER 80 MG CAP: 80 | 30 days supply | Qty: 30 | Fill #2

## 2018-06-06 MED FILL — METFORMIN HCL ER 500 MG TAB: 500 | 30 days supply | Qty: 120 | Fill #0

## 2018-07-06 ENCOUNTER — Telehealth: Payer: Self-pay | Admitting: Nurse Practitioner

## 2018-07-06 NOTE — Telephone Encounter (Signed)
New Message   Pt states she is having an allergic reaction, she is breaking out on her face and its red with swelling

## 2018-07-06 NOTE — Telephone Encounter (Signed)
Patients call taken.  Patient identified by name and date of birth.  Patient states she took on a new job two days ago and started to have an allergic reaction with petechiae and swelling to the face.  Today patient noticed more redness to the face and whole body redness and petechiae.

## 2018-07-07 ENCOUNTER — Ambulatory Visit
Admission: EM | Admit: 2018-07-07 | Discharge: 2018-07-07 | Disposition: A | Discharge status: Home / Self Care | Payer: Medicaid Other | Attending: Family Medicine | Admitting: Family Medicine

## 2018-07-07 ENCOUNTER — Encounter: Payer: Self-pay | Admitting: Emergency Medicine

## 2018-07-07 ENCOUNTER — Other Ambulatory Visit: Payer: Self-pay

## 2018-07-07 DIAGNOSIS — L239 Allergic contact dermatitis, unspecified cause: Secondary | ICD-10-CM

## 2018-07-07 MED ORDER — PREDNISONE 20 MG PO TABS: 20.0000 mg | ORAL_TABLET | Freq: Every day | ORAL | 0 refills | Status: DC

## 2018-07-07 MED ORDER — TRIAMCINOLONE ACETONIDE 0.1 % EX CREA: 1.0000 "application " | TOPICAL_CREAM | Freq: Two times a day (BID) | CUTANEOUS | 0 refills | Status: DC

## 2018-07-07 NOTE — ED Triage Notes (Signed)
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 She has been taking OTC Allegra, Claritin and Benadryl with no improvement. Denies trouble breathing and denies difficulty swallowing.

## 2018-07-07 NOTE — Discharge Instructions (Signed)
Continue other current antihistamine medication

## 2018-07-19 MED FILL — PROPRANOLOL ER 80 MG CAP: 80 | 30 days supply | Qty: 30 | Fill #3

## 2018-07-22 NOTE — ED Provider Notes (Signed)
[facility]    CSN: [id number] Arrival date & time: 07/07/18  1317     History   Chief Complaint Chief Complaint  Patient presents with  . Rash    HPI Tonya Paul is a 43 y.o. female.   43 yo female with a c/o itchy rash to face that started several days ago after cleaning/dusting some shelves. Denies any shortness of breath, wheezing.   The history is provided by the patient.  Rash    Past Medical History:  Diagnosis Date  . Diabetes mellitus without complication ([place])   . Hemorrhoids 11/18/2015  . Hypertension   . Left breast mass 09/01/2015  . 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 Palpitations 02/03/2016   PVCs seen on monitor  . Vertigo     Patient Active Problem List   Diagnosis Date Noted  . Abdominal pain, chronic, epigastric   . GERD (gastroesophageal reflux disease) 06/20/2017  . Long Q-T syndrome 01/14/2017  . Palpitations 02/03/2016  . Uterine fibroid 12/11/2015  . Family history of heart disease 12/03/2015  . Hemorrhoids 11/18/2015  . Left breast mass 09/01/2015  . Obesity 09/01/2015  . Type 2 diabetes mellitus, uncontrolled ([place]) 04/16/2015    Past Surgical History:  Procedure Laterality Date  . BREAST BIOPSY Left 09/03/2015   [place] Core bx, benign  . ESOPHAGOGASTRODUODENOSCOPY (EGD) WITH PROPOFOL N/A 06/21/2017   Procedure: ESOPHAGOGASTRODUODENOSCOPY (EGD) WITH PROPOFOL;  Surgeon: Lin Landsman, MD;  Location: [place];  Service: Gastroenterology;  Laterality: N/A;    OB History    Gravida  3   Para      Term      Preterm      AB  1   Living        SAB  1   TAB      Ectopic      Multiple      Live Births               Home Medications    Prior to Admission medications   Medication Sig Start Date End Date Taking? Authorizing Provider  Blood Glucose Monitoring Suppl (TRUE METRIX METER) DEVI 1 each by Does not apply route 3 (three) times daily before meals. 04/16/15  Yes Newlin,  Charlane Ferretti, MD  glucose blood (RELION GLUCOSE TEST STRIPS) test strip Use as instructed 07/30/16  Yes Hairston, Mandesia R, FNP  ibuprofen (ADVIL,MOTRIN) 600 MG tablet Take 1 tablet (600 mg total) by mouth every 6 (six) hours as needed. 05/05/18  Yes Arta Silence, MD  Insulin Pen Needle (B-D UF III MINI PEN NEEDLES) 31G X 5 MM MISC Use as instructed 09/28/17  Yes Gildardo Pounds, NP  Insulin Syringe-Needle U-100 (B-D INS SYRINGE 0.5CC/31GX5/16) 31G X 5/16" 0.5 ML MISC USE AS DIRECTED DAILY FOR INSULIN ADMINISTRATION 11/29/16  Yes Hairston, Mandesia R, FNP  LANTUS SOLOSTAR 100 UNIT/ML Solostar Pen Inject 25 Units into the skin at bedtime. 12/27/17  Yes [provider]  metFORMIN (GLUCOPHAGE-XR) 500 MG 24 hr tablet TAKE 2 TABLETS (1,000 MG TOTAL) BY MOUTH 2 (TWO) TIMES DAILY. 06/06/18  Yes Gildardo Pounds, NP  predniSONE (DELTASONE) 20 MG tablet Take 1 tablet (20 mg total) by mouth daily. 07/07/18   Norval Gable, MD  propranolol ER (INDERAL LA) 80 MG 24 hr capsule Take 1 capsule (80 mg total) by mouth daily. 02/19/18 05/20/18  Gildardo Pounds, NP  rosuvastatin (CRESTOR) 5 MG tablet Take 1 tablet (5 mg total)  by mouth daily. 05/15/18 08/13/18  Gildardo Pounds, NP  tamsulosin (FLOMAX) 0.4 MG CAPS capsule Take 1 capsule (0.4 mg total) by mouth daily. 04/13/18   Schaevitz, Randall An, MD  triamcinolone cream (KENALOG) 0.1 % Apply 1 application topically 2 (two) times daily. 07/07/18   Norval Gable, MD    Family History Family History  Problem Relation Age of Onset  . Hypertension Mother   . Lung cancer Mother   . Throat cancer Father   . Diabetes Sister   . Lupus Maternal Grandmother   . Stroke Maternal Grandfather   . Sudden Cardiac Death Daughter   . Breast cancer Paternal Aunt     Social History Social History   Tobacco Use  . Smoking status: Never Smoker  . Smokeless tobacco: Never Used  Substance Use Topics  . Alcohol use: No  . Drug use: No     Allergies   Ace  inhibitors; Aspirin; Rocephin [ceftriaxone]; and Ciprofloxacin   Review of Systems Review of Systems  Skin: Positive for rash.     Physical Exam Triage Vital Signs ED Triage Vitals  Enc Vitals Group     BP 07/07/18 1337 (!) 138/93     Pulse Rate 07/07/18 1337 87     Resp 07/07/18 1337 18     Temp 07/07/18 1337 98.4 F (36.9 C)     Temp Source 07/07/18 1337 Oral     SpO2 07/07/18 1337 99 %     Weight 07/07/18 1331 203 lb (92.1 kg)     Height 07/07/18 1331 5\' 10"  (1.778 m)     Head Circumference --      Peak Flow --      Pain Score 07/07/18 1331 0     Pain Loc --      Pain Edu? --      Excl. in [place]? --    No data found.  Updated Vital Signs BP (!) 138/93 (BP Location: Right Arm)   Pulse 87   Temp 98.4 F (36.9 C) (Oral)   Resp 18   Ht 5\' 10"  (1.778 m)   Wt 92.1 kg   SpO2 99%   BMI 29.13 kg/m   Visual Acuity Right Eye Distance:   Left Eye Distance:   Bilateral Distance:    Right Eye Near:   Left Eye Near:    Bilateral Near:     Physical Exam Vitals signs and nursing note reviewed.  Constitutional:      General: She is not in acute distress.    Appearance: She is not toxic-appearing or diaphoretic.  Pulmonary:     Effort: Pulmonary effort is normal. No respiratory distress.     Breath sounds: No stridor. No wheezing.  Skin:    Findings: Rash (diffuse maculopapular on face ) present.  Neurological:     Mental Status: She is alert.      UC Treatments / Results  Labs (all labs ordered are listed, but only abnormal results are displayed) Labs Reviewed - No data to display  EKG None  Radiology No results found.  Procedures Procedures (including critical care time)  Medications Ordered in UC Medications - No data to display  Initial Impression / Assessment and Plan / UC Course  I have reviewed the triage vital signs and the nursing notes.  Pertinent labs & imaging results that were available during my care of the patient were reviewed by me  and considered in my medical decision making (see chart for details).  Final Clinical Impressions(s) / UC Diagnoses   Final diagnoses:  Allergic contact dermatitis, unspecified trigger     Discharge Instructions     Continue other current antihistamine medication    ED Prescriptions    Medication Sig Dispense Auth. Provider   predniSONE (DELTASONE) 20 MG tablet Take 1 tablet (20 mg total) by mouth daily. 7 tablet Sayyid Harewood, Linward Foster, MD   triamcinolone cream (KENALOG) 0.1 % Apply 1 application topically 2 (two) times daily. 35 g Norval Gable, MD     1.diagnosis reviewed with patient 2. rx as per orders above; reviewed possible side effects, interactions, risks and benefits  3. Recommend supportive treatment as above 4. Follow-up prn if symptoms worsen or don't improve Controlled Substance Prescriptions [place] Controlled Substance Registry consulted? Not Applicable   Norval Gable, MD 07/22/18 [phone]

## 2018-07-28 ENCOUNTER — Ambulatory Visit (HOSPITAL_COMMUNITY)
Admission: EM | Admit: 2018-07-28 | Discharge: 2018-07-28 | Disposition: A | Discharge status: Home / Self Care | Payer: Medicaid Other | Attending: Physician Assistant | Admitting: Physician Assistant

## 2018-07-28 ENCOUNTER — Other Ambulatory Visit: Payer: Self-pay

## 2018-07-28 ENCOUNTER — Encounter (HOSPITAL_COMMUNITY): Payer: Self-pay

## 2018-07-28 DIAGNOSIS — R112 Nausea with vomiting, unspecified: Secondary | ICD-10-CM

## 2018-07-28 DIAGNOSIS — R109 Unspecified abdominal pain: Secondary | ICD-10-CM

## 2018-07-28 DIAGNOSIS — I10 Essential (primary) hypertension: Secondary | ICD-10-CM

## 2018-07-28 MED ORDER — SODIUM CHLORIDE 0.9 % IV BOLUS
1000.0000 mL | Freq: Once | INTRAVENOUS | Status: AC
  Administered 2018-07-28: 1000 mL via INTRAVENOUS

## 2018-07-28 NOTE — Discharge Instructions (Signed)
Return if symptoms not resolved in 24 hours.

## 2018-07-28 NOTE — ED Provider Notes (Signed)
[place]    CSN: [id number] Arrival date & time: 07/28/18  1825     History   Chief Complaint Chief Complaint  Patient presents with  . Abdominal Pain    HPI Tonya Paul is a 43 y.o. female.   The history is provided by the patient. No language interpreter was used.  Abdominal Pain  Pain location:  Generalized Pain quality: aching   Pain radiates to:  Does not radiate Timing:  Constant Progression:  Worsening Chronicity:  New Relieved by:  Nothing Worsened by:  Nothing Ineffective treatments:  None tried Pt complains of vomiting after eating a fish sandwich at [facility]   Past Medical History:  Diagnosis Date  . Diabetes mellitus without complication ([place])   . Hemorrhoids 11/18/2015  . Hypertension   . Left breast mass 09/01/2015  . 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 Palpitations 02/03/2016   PVCs seen on monitor  . Vertigo     Patient Active Problem List   Diagnosis Date Noted  . Abdominal pain, chronic, epigastric   . GERD (gastroesophageal reflux disease) 06/20/2017  . Long Q-T syndrome 01/14/2017  . Palpitations 02/03/2016  . Uterine fibroid 12/11/2015  . Family history of heart disease 12/03/2015  . Hemorrhoids 11/18/2015  . Left breast mass 09/01/2015  . Obesity 09/01/2015  . Type 2 diabetes mellitus, uncontrolled ([place]) 04/16/2015    Past Surgical History:  Procedure Laterality Date  . BREAST BIOPSY Left 09/03/2015   [place] Core bx, benign  . ESOPHAGOGASTRODUODENOSCOPY (EGD) WITH PROPOFOL N/A 06/21/2017   Procedure: ESOPHAGOGASTRODUODENOSCOPY (EGD) WITH PROPOFOL;  Surgeon: Lin Landsman, MD;  Location: [place];  Service: Gastroenterology;  Laterality: N/A;    OB History    Gravida  3   Para      Term      Preterm      AB  1   Living        SAB  1   TAB      Ectopic      Multiple      Live Births               Home Medications    Prior to Admission medications   Medication Sig  Start Date End Date Taking? Authorizing Provider  Blood Glucose Monitoring Suppl (TRUE METRIX METER) DEVI 1 each by Does not apply route 3 (three) times daily before meals. 04/16/15   Charlott Rakes, MD  glucose blood (RELION GLUCOSE TEST STRIPS) test strip Use as instructed 07/30/16   Alfonse Spruce, FNP  ibuprofen (ADVIL,MOTRIN) 600 MG tablet Take 1 tablet (600 mg total) by mouth every 6 (six) hours as needed. 05/05/18   Arta Silence, MD  Insulin Pen Needle (B-D UF III MINI PEN NEEDLES) 31G X 5 MM MISC Use as instructed 09/28/17   Gildardo Pounds, NP  Insulin Syringe-Needle U-100 (B-D INS SYRINGE 0.5CC/31GX5/16) 31G X 5/16" 0.5 ML MISC USE AS DIRECTED DAILY FOR INSULIN ADMINISTRATION 11/29/16   Alfonse Spruce, FNP  LANTUS SOLOSTAR 100 UNIT/ML Solostar Pen Inject 25 Units into the skin at bedtime. 12/27/17   [provider]  metFORMIN (GLUCOPHAGE-XR) 500 MG 24 hr tablet TAKE 2 TABLETS (1,000 MG TOTAL) BY MOUTH 2 (TWO) TIMES DAILY. 06/06/18   Gildardo Pounds, NP  predniSONE (DELTASONE) 20 MG tablet Take 1 tablet (20 mg total) by mouth daily. 07/07/18   Norval Gable, MD  propranolol ER (INDERAL LA) 80 MG 24 hr  capsule Take 1 capsule (80 mg total) by mouth daily. 02/19/18 05/20/18  Gildardo Pounds, NP  rosuvastatin (CRESTOR) 5 MG tablet Take 1 tablet (5 mg total) by mouth daily. 05/15/18 08/13/18  Gildardo Pounds, NP  tamsulosin (FLOMAX) 0.4 MG CAPS capsule Take 1 capsule (0.4 mg total) by mouth daily. 04/13/18   Schaevitz, Randall An, MD  triamcinolone cream (KENALOG) 0.1 % Apply 1 application topically 2 (two) times daily. 07/07/18   Norval Gable, MD    Family History Family History  Problem Relation Age of Onset  . Hypertension Mother   . Lung cancer Mother   . Throat cancer Father   . Diabetes Sister   . Lupus Maternal Grandmother   . Stroke Maternal Grandfather   . Sudden Cardiac Death Daughter   . Breast cancer Paternal Aunt     Social History Social History    Tobacco Use  . Smoking status: Never Smoker  . Smokeless tobacco: Never Used  Substance Use Topics  . Alcohol use: No  . Drug use: No     Allergies   Ace inhibitors; Aspirin; Rocephin [ceftriaxone]; and Ciprofloxacin   Review of Systems Review of Systems  Gastrointestinal: Positive for abdominal pain.  All other systems reviewed and are negative.    Physical Exam Triage Vital Signs ED Triage Vitals  Enc Vitals Group     BP 07/28/18 1903 (!) 133/96     Pulse Rate 07/28/18 1903 98     Resp 07/28/18 1903 18     Temp 07/28/18 1903 98.7 F (37.1 C)     Temp Source 07/28/18 1903 Oral     SpO2 07/28/18 1903 99 %     Weight --      Height --      Head Circumference --      Peak Flow --      Pain Score 07/28/18 1902 8     Pain Loc --      Pain Edu? --      Excl. in [place]? --    No data found.  Updated Vital Signs BP (!) 133/96 (BP Location: Left Arm)   Pulse 98   Temp 98.7 F (37.1 C) (Oral)   Resp 18   SpO2 99%   Visual Acuity Right Eye Distance:   Left Eye Distance:   Bilateral Distance:    Right Eye Near:   Left Eye Near:    Bilateral Near:     Physical Exam Vitals signs and nursing note reviewed.  Constitutional:      Appearance: She is well-developed.  HENT:     Head: Normocephalic.  Neck:     Musculoskeletal: Normal range of motion.  Cardiovascular:     Rate and Rhythm: Normal rate.  Pulmonary:     Effort: Pulmonary effort is normal.  Abdominal:     General: Abdomen is flat. Bowel sounds are normal. There is no distension.     Palpations: Abdomen is soft.  Musculoskeletal: Normal range of motion.  Skin:    General: Skin is warm.  Neurological:     Mental Status: She is alert and oriented to person, place, and time.  Psychiatric:        Mood and Affect: Mood normal.      UC Treatments / Results  Labs (all labs ordered are listed, but only abnormal results are displayed) Labs Reviewed - No data to display  EKG None  Radiology  No results found.  Procedures Procedures (including critical care  time)  Medications Ordered in UC Medications  sodium chloride 0.9 % bolus 1,000 mL (1,000 mLs Intravenous New Bag/Given 07/28/18 1923)    Initial Impression / Assessment and Plan / UC Course  I have reviewed the triage vital signs and the nursing notes.  Pertinent labs & imaging results that were available during my care of the patient were reviewed by me and considered in my medical decision making (see chart for details).     Pt has prolonged qt syndrome.   I can not treat with nausea medication.  Pt given Iv fluids x 1 liter.  Pt advised to drink clear liquids x 12 hours.  Recheck in 24 hours if not improving  Pt feels better after IV fluids Final Clinical Impressions(s) / UC Diagnoses   Final diagnoses:  Nausea and vomiting, intractability of vomiting not specified, unspecified vomiting type   Discharge Instructions   None    ED Prescriptions    None     Controlled Substance Prescriptions [place] Controlled Substance Registry consulted? Not Applicable   Sidney Ace 07/28/18 2002

## 2018-07-28 NOTE — ED Triage Notes (Signed)
Patient presents to Urgent Care with complaints of nausea, vomiting, diarrhea, and stomach cramps since eating [employer] yesterday. Patient reports no fever.

## 2018-08-14 ENCOUNTER — Other Ambulatory Visit: Payer: Self-pay

## 2018-08-14 ENCOUNTER — Ambulatory Visit
Admission: EM | Admit: 2018-08-14 | Discharge: 2018-08-14 | Disposition: A | Discharge status: Home / Self Care | Payer: Medicaid Other

## 2018-08-14 DIAGNOSIS — R51 Headache: Secondary | ICD-10-CM

## 2018-08-14 DIAGNOSIS — R42 Dizziness and giddiness: Secondary | ICD-10-CM

## 2018-08-14 DIAGNOSIS — R519 Headache, unspecified: Secondary | ICD-10-CM

## 2018-08-14 NOTE — Discharge Instructions (Addendum)
You will require a head CT to fully work up your condition. We do not have CT services today. Recommend you going to [facility] ED for further evaluation and treatment.   Honor Loh, MSN, APRN, FNP-C, CEN Advanced Practice Provider [facility] 08/14/2018 2:33 PM

## 2018-08-14 NOTE — ED Provider Notes (Signed)
[address], [place], [place] [address] [phone]   Name: Tonya Paul DOB: 08-Aug-1975 MRN: [id number] CSN: [id number] PCP: Tonya Pounds, NP  Arrival date and time:  08/14/18 1400  Chief Complaint:  Dizziness  NOTE: Prior to seeing the patient today, I have reviewed the triage nursing documentation and vital signs. Clinical staff has updated patient's PMH/PSHx, current medication list, and drug allergies/intolerances to ensure comprehensive history available to assist in medical decision making.   History:   HPI: Tonya Paul is a 43 y.o. female who presents today with complaints of vertiginous symptoms with accompanying headache. Patient notes that symptoms started approximately 20-30 minutes PTA while walking around [place]. Patient states, "I felt like I was going to pass out. I had to sit down". She denies associated nausea and vomiting. PMH significant for vertigo, however patient notes that this episode differs from her normal vertigo in that she has an associated headache and photophobia. Patient had a similar episode (dizziness only) on 08/11/2018 that began with acute onset at 1600 and last until 0300 the next morning. Patient self treated with Dramamine on Friday and then again today.    Patient has experienced an increase in palpitations over the last few weeks. PMH (+) for prolonged QT syndrome. She notes that her condition has been under control since starting Propranolol. She denies associated chest pain and shortness of breath. Patient denies any recent illnesses. She has been eating and drinking normally.   Patient in no acute distress upon arrival. Her VS noted to be stable.    Past Medical History:  Diagnosis Date  . Diabetes mellitus without complication ([place])   . Hemorrhoids 11/18/2015  . Hypertension   . Left breast mass 09/01/2015  . 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 Palpitations 02/03/2016   PVCs seen on monitor  . Vertigo      Past Surgical History:  Procedure Laterality Date  . BREAST BIOPSY Left 09/03/2015   [place] Core bx, benign  . ESOPHAGOGASTRODUODENOSCOPY (EGD) WITH PROPOFOL N/A 06/21/2017   Procedure: ESOPHAGOGASTRODUODENOSCOPY (EGD) WITH PROPOFOL;  Surgeon: Lin Landsman, MD;  Location: [place] [facility];  Service: Gastroenterology;  Laterality: N/A;    Family History  Problem Relation Age of Onset  . Hypertension Mother   . Lung cancer Mother   . Throat cancer Father   . Diabetes Sister   . Lupus Maternal Grandmother   . Stroke Maternal Grandfather   . Sudden Cardiac Death Daughter   . Breast cancer Paternal Aunt     Social History   Socioeconomic History  . Marital status: Married    Spouse name: Not on file  . Number of children: Not on file  . Years of education: Not on file  . Highest education level: Not on file  Occupational History  . Not on file  Social Needs  . Financial resource strain: Not on file  . Food insecurity:    Worry: Not on file    Inability: Not on file  . Transportation needs:    Medical: Not on file    Non-medical: Not on file  Tobacco Use  . Smoking status: Never Smoker  . Smokeless tobacco: Never Used  Substance and Sexual Activity  . Alcohol use: No  . Drug use: No  . Sexual activity: Yes    Birth control/protection: None  Lifestyle  . Physical activity:    Days per week: Not on file    Minutes per session: Not on file  .  Stress: Not on file  Relationships  . Social connections:    Talks on phone: Not on file    Gets together: Not on file    Attends religious service: Not on file    Active member of club or organization: Not on file    Attends meetings of clubs or organizations: Not on file    Relationship status: Not on file  . Intimate partner violence:    Fear of current or ex partner: Not on file    Emotionally abused: Not on file    Physically abused: Not on file    Forced sexual activity: Not on file  Other Topics Concern  . Not  on file  Social History Narrative  . Not on file    Patient Active Problem List   Diagnosis Date Noted  . Abdominal pain, chronic, epigastric   . GERD (gastroesophageal reflux disease) 06/20/2017  . Long Q-T syndrome 01/14/2017  . Palpitations 02/03/2016  . Uterine fibroid 12/11/2015  . Family history of heart disease 12/03/2015  . Hemorrhoids 11/18/2015  . Left breast mass 09/01/2015  . Obesity 09/01/2015  . Type 2 diabetes mellitus, uncontrolled ([place]) 04/16/2015    Home Medications:    Current Meds  Medication Sig  . Blood Glucose Monitoring Suppl (TRUE METRIX METER) DEVI 1 each by Does not apply route 3 (three) times daily before meals.  Marland Kitchen glucose blood (RELION GLUCOSE TEST STRIPS) test strip Use as instructed  . Insulin Pen Needle (B-D UF III MINI PEN NEEDLES) 31G X 5 MM MISC Use as instructed  . Insulin Syringe-Needle U-100 (B-D INS SYRINGE 0.5CC/31GX5/16) 31G X 5/16" 0.5 ML MISC USE AS DIRECTED DAILY FOR INSULIN ADMINISTRATION  . LANTUS SOLOSTAR 100 UNIT/ML Solostar Pen Inject 25 Units into the skin at bedtime.  . metFORMIN (GLUCOPHAGE-XR) 500 MG 24 hr tablet TAKE 2 TABLETS (1,000 MG TOTAL) BY MOUTH 2 (TWO) TIMES DAILY.  Marland Kitchen propranolol ER (INDERAL LA) 80 MG 24 hr capsule Take 1 capsule (80 mg total) by mouth daily.  Marland Kitchen triamcinolone cream (KENALOG) 0.1 % Apply 1 application topically 2 (two) times daily.    Allergies:   Ace inhibitors; Aspirin; Rocephin [ceftriaxone]; and Ciprofloxacin  Review of Systems (ROS): Review of Systems  Constitutional: Negative for chills and fever.  HENT: Negative for ear pain, rhinorrhea, sinus pain and tinnitus.   Eyes: Positive for photophobia.  Respiratory: Negative for cough and shortness of breath.   Cardiovascular: Positive for palpitations. Negative for chest pain.       History of prolonged QT syndrome  Gastrointestinal: Negative for nausea and vomiting.  Neurological: Positive for dizziness and headaches. Negative for tremors,  syncope, facial asymmetry, speech difficulty, weakness and numbness.     Physical Exam:  Triage Vital Signs ED Triage Vitals  Enc Vitals Group     BP 08/14/18 1410 134/81     Pulse Rate 08/14/18 1410 87     Resp 08/14/18 1410 18     Temp 08/14/18 1410 98.2 F (36.8 C)     Temp Source 08/14/18 1410 Oral     SpO2 08/14/18 1410 100 %     Weight 08/14/18 1409 201 lb (91.2 kg)     Height 08/14/18 1409 5\' 10"  (1.778 m)     Head Circumference --      Peak Flow --      Pain Score 08/14/18 1409 4     Pain Loc --      Pain Edu? --  Excl. in [place]? --     Physical Exam  Constitutional: She is oriented to person, place, and time and well-developed, well-nourished, and in no distress.  HENT:  Head: Normocephalic and atraumatic.  Mouth/Throat: Oropharynx is clear and moist and mucous membranes are normal.  Eyes: Pupils are equal, round, and reactive to light. EOM are normal.  Neck: Normal range of motion. Neck supple. No tracheal deviation present.  Cardiovascular: Normal rate.  Pulmonary/Chest: Effort normal. No respiratory distress.  Neurological: She is alert and oriented to person, place, and time. She is not disoriented. She displays no weakness, no tremor, facial symmetry and normal speech. No sensory deficit. Gait (2/2 vertiginous symptoms) abnormal. GCS score is 15.  Skin: Skin is warm and dry. No rash noted. No erythema.  Psychiatric: Mood, affect and judgment normal.  Nursing note and vitals reviewed.    Urgent Care Treatments / Results:   LABS: PLEASE NOTE: all labs that were ordered this encounter are listed, however only abnormal results are displayed. Labs Reviewed - No data to display  EKG: -None  RADIOLOGY: No results found.  PRODEDURES: Procedures  MEDICATIONS RECEIVED THIS VISIT: Medications - No data to display  PERTINENT CLINICAL COURSE NOTES/UPDATES: No data to display   Initial Impression / Assessment and Plan / Urgent Care Course:    Dalesha Stanback is a 43 y.o. female who presents to [facility] today with complaints of Dizziness  Pertinent labs & imaging results that were available during my care of the patient were personally reviewed by me and considered in my medical decision making (see lab/imaging section of note for values and interpretations).  Symptoms declared with acute onset. PNH (+) for vertigo, however patient has never had an associated headache. Patient feels ataxic and pre-syncopal when standing. Limited on treatment capabilities in the outpatient urgent care setting. No CT capabilities today, which is ultimately what the patient needs to rule out acute intracranial process. Discussed starting workup here at [facility] vs. immediate transfer for more definitive care. Patient will need labs, IVF's, and IV medications to treat her presenting symptoms. Her underlying cardiac condition limits what patient can be given to treat her headache, especially without cardiac monitoring capabilities. IV will have to be removed prior to transfer. Following review of options, patient electing to receive all care at single facility, which I feel is reasonable and the best approach for this patient. Discussed transfer to [facility] or [facility] for further evaluation and treatment. Patient is in agreement with plan of care as I reviewed with with her today in clinic. Patient indicates that she wishes to go to [facility] via POV. She is accompanied by her son today who will drive her.   Final Clinical Impressions(s) / Urgent Care Diagnoses:   Final diagnoses:  Acute nonintractable headache, unspecified headache type  Dizziness    New Prescriptions:  No orders of the defined types were placed in this encounter.   Controlled Substance Prescriptions:  [place] Controlled Substance Registry consulted? Not Applicable  NOTE: This note was prepared using Dragon dictation software along with smaller phrase technology. Despite my best ability to proofread,  there is the potential that transcriptional errors may still occur from this process, and are completely unintentional.     Karen Kitchens, NP 08/14/18 1459

## 2018-08-14 NOTE — ED Triage Notes (Signed)
Patient states that she is having dizziness that started around 20 minutes ago. States that she did take Dramamine because she has a history of vertigo. Patient complains of headaceh as well. Patient states that she had a very similar episode on Friday.

## 2018-08-17 ENCOUNTER — Telehealth: Payer: Self-pay | Admitting: Cardiovascular Disease

## 2018-08-17 NOTE — Telephone Encounter (Signed)
iphone/ my chart/ consent/self pay/ pre reg completed

## 2018-08-18 ENCOUNTER — Telehealth (INDEPENDENT_AMBULATORY_CARE_PROVIDER_SITE_OTHER): Payer: Self-pay | Admitting: Cardiovascular Disease

## 2018-08-18 ENCOUNTER — Telehealth: Payer: Self-pay

## 2018-08-18 ENCOUNTER — Encounter: Payer: Self-pay | Admitting: Cardiovascular Disease

## 2018-08-18 ENCOUNTER — Other Ambulatory Visit: Payer: Self-pay | Admitting: Nurse Practitioner

## 2018-08-18 DIAGNOSIS — I4581 Long QT syndrome: Secondary | ICD-10-CM

## 2018-08-18 DIAGNOSIS — R002 Palpitations: Secondary | ICD-10-CM

## 2018-08-18 MED FILL — METFORMIN HCL ER 500 MG TAB: 500 | 30 days supply | Qty: 120 | Fill #1

## 2018-08-18 MED FILL — PROPRANOLOL ER 80 MG CAP: 80 | 30 days supply | Qty: 30 | Fill #4

## 2018-08-18 MED FILL — TRUEPLUS PEN NDL 31GX3/16: 31G X 5 MM | 25 days supply | Qty: 100 | Fill #0

## 2018-08-18 MED FILL — TRUEPLUS PEN NDL 31GX3/16": 31G X 5 MM | 25 days supply | Qty: 100 | Fill #0

## 2018-08-18 NOTE — Telephone Encounter (Signed)
Patient and/or DPR-approved person aware of AVS instructions and verbalized understanding. AVS to be released to mychart. Lab slip sent to patient via mail.

## 2018-08-18 NOTE — Patient Instructions (Addendum)
Medication Instructions:  Your physician recommends that you continue on your current medications as directed. Please refer to the Current Medication list given to you today.  If you need a refill on your cardiac medications before your next appointment, please call your pharmacy.   Lab work: Your physician recommends that you return for lab work in 1-2 weeks: COMPLETE BLOOD COUNT, BASIC METABOLIC PANEL, THYROID FUNCTION TESTS. You do not need an appointment. You will receive a lab slip in the mail. Please bring this with you on the day you present for lab work.  If you have labs (blood work) drawn today and your tests are completely normal, you will receive your results only by: Marland Kitchen MyChart Message (if you have MyChart) OR . A paper copy in the mail If you have any lab test that is abnormal or we need to change your treatment, we will call you to review the results.  Testing/Procedures: Your physician has recommended that you wear a 14 DAY ZIO-PATCH monitor. The Zio patch cardiac monitor continuously records heart rhythm data for up to 14 days, this is for patients being evaluated for multiple types heart rhythms. For the first 24 hours post application, please avoid getting the Zio monitor wet in the shower or by excessive sweating during exercise. After that, feel free to carry on with regular activities. Keep soaps and lotions away from the ZIO XT Patch.  This will be placed at our [facility] location - [address]. You will be contacted by that office to set up an appointment. Your monitor may be mailed to you for application.       Follow-Up: At [facility], you and your health needs are our priority.  As part of our continuing mission to provide you with exceptional heart care, we have created designated Provider Care Teams.  These Care Teams include your primary Cardiologist (physician) and Advanced Practice Providers (APPs -  Physician Assistants and Nurse Practitioners)  who all work together to provide you with the care you need, when you need it. You will need a follow up appointment in 4-6 weeks with Dr. Gwenlyn Found.  You will be contacted by a scheduler to set up this appointment.

## 2018-08-18 NOTE — Progress Notes (Signed)
Virtual Visit via Video Note   This visit type was conducted due to national recommendations for restrictions regarding the COVID-19 Pandemic (e.g. social distancing) in an effort to limit this patient's exposure and mitigate transmission in our community.  Due to her co-morbid illnesses, this patient is at least at moderate risk for complications without adequate follow up.  This format is felt to be most appropriate for this patient at this time.  All issues noted in this document were discussed and addressed.  A limited physical exam was performed with this format.  Please refer to the patient's chart for her consent to telehealth for [facility].   Date:  08/18/2018   ID:  Tonya Paul, DOB November 03, 1975, MRN [id number]  Patient Location: Home Provider Location: Home  PCP:  Gildardo Pounds, NP  Cardiologist:  Quay Burow, MD  Electrophysiologist:  None   Evaluation Performed:  Follow-Up Visit  Chief Complaint: Palpitations  History of Present Illness:    Tonya Paul is a 43 y.o. female moderately overweight African-American female who recently left her husband, mother of one 53 year-old son , Tonya Paul, who was self-referred for evaluation of inheritable causes of sudden cardiac death. I last saw her in the office  01/14/2017. Unfortunately, her 15 year old daughter died suddenly 12-Nov-2012 of unclear reasons. Her fourth cousin did as well. Her only risk factor for heart disease as type 2 diabetes. She's never had a heart attack and stroke. She does get occasional shortness of breath but denies chest pain. She's had palpitations that occur daily over the last several months. She does not drink caffeine or alcohol. A 2-D echo was performed that showed normal LV function with mild to moderate TR. I referred her to Dr. Caryl Comes to evaluate her for inherited causes of sudden death such as "Channelopathies ". A routine GXT was performed by Dr. Caryl Comes and this was entirely normal other than  exercise-induced prolongation of her QT interval somewhat suggestive of long QT syndrome. Her son Tonya Paul was gently tested at [facility] and didn't not carry the gene. She has complained of occasional atypical chest pain which sounds musculoskeletal.  Since I saw her 2 years ago she is done well.  Recently she is noticed increasing frequency and severity of palpitations.  She was seen in the ER for presyncope without a diagnosis although she was having PVCs and she was given IV fluids.  She does not drink caffeine or alcohol nor she under undue stress.  The patient does not have symptoms concerning for COVID-19 infection (fever, chills, cough, or new shortness of breath).    Past Medical History:  Diagnosis Date  . Diabetes mellitus without complication ([place])   . Hemorrhoids 11/18/2015  . Hypertension   . Left breast mass 09/01/2015  . 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 Palpitations 02/03/2016   PVCs seen on monitor  . Vertigo    Past Surgical History:  Procedure Laterality Date  . BREAST BIOPSY Left 09/03/2015   [place] Core bx, benign  . ESOPHAGOGASTRODUODENOSCOPY (EGD) WITH PROPOFOL N/A 06/21/2017   Procedure: ESOPHAGOGASTRODUODENOSCOPY (EGD) WITH PROPOFOL;  Surgeon: Lin Landsman, MD;  Location: [place];  Service: Gastroenterology;  Laterality: N/A;     No outpatient medications have been marked as taking for the 08/18/18 encounter (Appointment) with Lorretta Harp, MD.     Allergies:   Ace inhibitors; Aspirin; Rocephin [ceftriaxone]; and Ciprofloxacin   Social History   Tobacco Use  . Smoking  status: Never Smoker  . Smokeless tobacco: Never Used  Substance Use Topics  . Alcohol use: No  . Drug use: No     Family Hx: The patient's family history includes Breast cancer in her paternal aunt; Diabetes in her sister; Hypertension in her mother; Lung cancer in her mother; Lupus in her maternal grandmother; Stroke in her maternal grandfather; Sudden  Cardiac Death in her daughter; Throat cancer in her father.  ROS:   Please see the history of present illness.     All other systems reviewed and are negative.   Prior CV studies:   The following studies were reviewed today:  None  Labs/Other Tests and Data Reviewed:    EKG:  An ECG dated 05/01/2018 was personally reviewed today and demonstrated:  Normal sinus rhythm at 71 without ST or T wave changes and with normal QTC  Recent Labs: 04/30/2018: ALT 20; Magnesium 2.1 05/11/2018: BUN 12; Creatinine, Ser 0.74; Hemoglobin 11.0; Platelets 441; Potassium 3.9; Sodium 133   Recent Lipid Panel Lab Results  Component Value Date/Time   CHOL 161 07/15/2016 03:15 PM   TRIG 114 07/15/2016 03:15 PM   HDL 40 07/15/2016 03:15 PM   CHOLHDL 4.0 07/15/2016 03:15 PM   CHOLHDL 3.6 04/18/2015 09:36 AM   LDLCALC 98 07/15/2016 03:15 PM    Wt Readings from Last 3 Encounters:  08/14/18 201 lb (91.2 kg)  07/07/18 203 lb (92.1 kg)  05/15/18 204 lb 6.4 oz (92.7 kg)     Objective:    Vital Signs:  There were no vitals taken for this visit.   VITAL SIGNS:  reviewed GEN:  no acute distress RESPIRATORY:  normal respiratory effort, symmetric expansion NEURO:  alert and oriented x 3, no obvious focal deficit PSYCH:  normal affect  ASSESSMENT & PLAN:    1. Palpitations- recent increase in frequency and palpitations for unclear reasons.  There is a history of sudden cardiac death in her family.  She is on propranolol 80 mg.  She does not drink caffeine or alcohol.  We will check some routine lab work on her and obtain a two-week ZIO patch  COVID-19 Education: The signs and symptoms of COVID-19 were discussed with the patient and how to seek care for testing (follow up with PCP or arrange E-visit).  The importance of social distancing was discussed today.  Time:   Today, I have spent 5 minutes with the patient with telehealth technology discussing the above problems.     Medication Adjustments/Labs  and Tests Ordered: Current medicines are reviewed at length with the patient today.  Concerns regarding medicines are outlined above.   Tests Ordered: No orders of the defined types were placed in this encounter.   Medication Changes: No orders of the defined types were placed in this encounter.   Disposition:  Follow up in 3 month(s)  Signed, Quay Burow, MD  08/18/2018 7:43 AM    [facility]

## 2018-08-22 MED FILL — $LANTUS SOLOSTAR 100 UNITS/: 100 | 84 days supply | Qty: 21 | Fill #0

## 2018-08-23 DIAGNOSIS — D75839 Thrombocytosis, unspecified: Secondary | ICD-10-CM

## 2018-08-23 DIAGNOSIS — D473 Essential (hemorrhagic) thrombocythemia: Secondary | ICD-10-CM

## 2018-08-26 LAB — TSH+T3+FREE T4+T3 FREE
Free T-3: 2.8 pg/mL
Free T4 by Dialysis: 1.2 ng/dL
TSH: 1.1 uU/mL
Triiodothyronine (T-3), Serum: 111 ng/dL

## 2018-08-26 LAB — CBC
Hematocrit: 32.9 % — ABNORMAL LOW (ref 34.0–46.6)
Hemoglobin: 10.6 g/dL — ABNORMAL LOW (ref 11.1–15.9)
MCH: 25.4 pg — ABNORMAL LOW (ref 26.6–33.0)
MCHC: 32.2 g/dL (ref 31.5–35.7)
MCV: 79 fL (ref 79–97)
Platelets: 475 10*3/uL — ABNORMAL HIGH (ref 150–450)
RBC: 4.18 x10E6/uL (ref 3.77–5.28)
RDW: 13.6 % (ref 11.7–15.4)
WBC: 8 10*3/uL (ref 3.4–10.8)

## 2018-08-26 LAB — BASIC METABOLIC PANEL
BUN/Creatinine Ratio: 8 — ABNORMAL LOW (ref 9–23)
BUN: 7 mg/dL (ref 6–24)
CO2: 19 mmol/L — ABNORMAL LOW (ref 20–29)
Calcium: 9.6 mg/dL (ref 8.7–10.2)
Chloride: 100 mmol/L (ref 96–106)
Creatinine, Ser: 0.86 mg/dL (ref 0.57–1.00)
GFR calc Af Amer: 96 mL/min/{1.73_m2} (ref >59)
GFR calc non Af Amer: 84 mL/min/{1.73_m2} (ref >59)
Glucose: 187 mg/dL — ABNORMAL HIGH (ref 65–99)
Potassium: 4.3 mmol/L (ref 3.5–5.2)
Sodium: 136 mmol/L (ref 134–144)

## 2018-08-30 ENCOUNTER — Telehealth: Payer: Medicaid Other | Admitting: Cardiovascular Disease

## 2018-09-08 ENCOUNTER — Telehealth: Payer: Self-pay | Admitting: *Deleted

## 2018-09-08 ENCOUNTER — Telehealth: Payer: Self-pay | Admitting: Nurse Practitioner

## 2018-09-08 NOTE — Telephone Encounter (Signed)
14 day ZIO XT long term holter monitor to be mailed to patients home.  Instructions reviewed briefly as they are included in the monitor kit.

## 2018-09-08 NOTE — Telephone Encounter (Signed)
New Message   Pt states she is needing to have her platelets checked per her cardiologist and also need her letter of accomodation signed for her job. Please f/u

## 2018-09-13 ENCOUNTER — Encounter: Payer: Self-pay | Admitting: Nurse Practitioner

## 2018-09-13 NOTE — Telephone Encounter (Signed)
Patient scheduled for a repeat platelets lab appt. In July.

## 2018-09-19 ENCOUNTER — Ambulatory Visit
Admission: RE | Admit: 2018-09-19 | Discharge: 2018-09-19 | Disposition: A | Discharge status: Home / Self Care | Payer: No Typology Code available for payment source | Source: Ambulatory Visit | Attending: Nurse Practitioner | Admitting: Nurse Practitioner

## 2018-09-19 ENCOUNTER — Other Ambulatory Visit: Payer: Self-pay

## 2018-09-19 DIAGNOSIS — Z1231 Encounter for screening mammogram for malignant neoplasm of breast: Secondary | ICD-10-CM

## 2018-09-27 ENCOUNTER — Other Ambulatory Visit: Payer: Self-pay | Admitting: Nurse Practitioner

## 2018-09-27 DIAGNOSIS — I10 Essential (primary) hypertension: Secondary | ICD-10-CM

## 2018-09-27 MED FILL — PROPRANOLOL ER 80 MG CAP: 80 | 30 days supply | Qty: 30 | Fill #5

## 2018-10-02 ENCOUNTER — Other Ambulatory Visit: Payer: Self-pay

## 2018-10-02 ENCOUNTER — Ambulatory Visit: Payer: Self-pay | Attending: Nurse Practitioner

## 2018-10-02 DIAGNOSIS — R002 Palpitations: Secondary | ICD-10-CM

## 2018-10-03 ENCOUNTER — Other Ambulatory Visit: Payer: Self-pay | Admitting: Nurse Practitioner

## 2018-10-03 DIAGNOSIS — D649 Anemia, unspecified: Secondary | ICD-10-CM

## 2018-10-03 LAB — CBC
Hematocrit: 32.6 % — ABNORMAL LOW (ref 34.0–46.6)
Hemoglobin: 10.5 g/dL — ABNORMAL LOW (ref 11.1–15.9)
MCH: 25.4 pg — ABNORMAL LOW (ref 26.6–33.0)
MCHC: 32.2 g/dL (ref 31.5–35.7)
MCV: 79 fL (ref 79–97)
Platelets: 474 10*3/uL — ABNORMAL HIGH (ref 150–450)
RBC: 4.14 x10E6/uL (ref 3.77–5.28)
RDW: 15 % (ref 11.7–15.4)
WBC: 8.2 10*3/uL (ref 3.4–10.8)

## 2018-10-03 MED ORDER — FERROUS SULFATE 324 (65 FE) MG PO TBEC: 1.0000 | DELAYED_RELEASE_TABLET | Freq: Two times a day (BID) | ORAL | 1 refills | Status: DC

## 2018-10-21 ENCOUNTER — Encounter: Payer: Self-pay | Admitting: Emergency Medicine

## 2018-10-21 ENCOUNTER — Emergency Department
Admission: EM | Admit: 2018-10-21 | Discharge: 2018-10-21 | Disposition: A | Discharge status: Home / Self Care | Payer: No Typology Code available for payment source | Attending: Emergency Medicine | Admitting: Emergency Medicine

## 2018-10-21 ENCOUNTER — Other Ambulatory Visit: Payer: Self-pay

## 2018-10-21 DIAGNOSIS — X58XXXA Exposure to other specified factors, initial encounter: Secondary | ICD-10-CM | POA: Insufficient documentation

## 2018-10-21 DIAGNOSIS — Y929 Unspecified place or not applicable: Secondary | ICD-10-CM | POA: Insufficient documentation

## 2018-10-21 DIAGNOSIS — Z888 Allergy status to other drugs, medicaments and biological substances status: Secondary | ICD-10-CM | POA: Insufficient documentation

## 2018-10-21 DIAGNOSIS — Y999 Unspecified external cause status: Secondary | ICD-10-CM | POA: Insufficient documentation

## 2018-10-21 DIAGNOSIS — K0889 Other specified disorders of teeth and supporting structures: Secondary | ICD-10-CM

## 2018-10-21 DIAGNOSIS — Z881 Allergy status to other antibiotic agents status: Secondary | ICD-10-CM | POA: Insufficient documentation

## 2018-10-21 DIAGNOSIS — S025XXA Fracture of tooth (traumatic), initial encounter for closed fracture: Secondary | ICD-10-CM | POA: Insufficient documentation

## 2018-10-21 DIAGNOSIS — I10 Essential (primary) hypertension: Secondary | ICD-10-CM | POA: Insufficient documentation

## 2018-10-21 DIAGNOSIS — Z79899 Other long term (current) drug therapy: Secondary | ICD-10-CM | POA: Insufficient documentation

## 2018-10-21 DIAGNOSIS — Z794 Long term (current) use of insulin: Secondary | ICD-10-CM | POA: Insufficient documentation

## 2018-10-21 DIAGNOSIS — Z886 Allergy status to analgesic agent status: Secondary | ICD-10-CM | POA: Insufficient documentation

## 2018-10-21 DIAGNOSIS — Y939 Activity, unspecified: Secondary | ICD-10-CM | POA: Insufficient documentation

## 2018-10-21 DIAGNOSIS — E119 Type 2 diabetes mellitus without complications: Secondary | ICD-10-CM | POA: Insufficient documentation

## 2018-10-21 MED ORDER — AMOXICILLIN 500 MG PO CAPS
1000.0000 mg | ORAL_CAPSULE | Freq: Once | ORAL | Status: AC
  Administered 2018-10-21: 1000 mg via ORAL
  Filled 2018-10-21: qty 2

## 2018-10-21 MED ORDER — AMOXICILLIN 875 MG PO TABS: 875.0000 mg | ORAL_TABLET | Freq: Two times a day (BID) | ORAL | 0 refills | Status: DC

## 2018-10-21 MED ORDER — HYDROCODONE-ACETAMINOPHEN 5-325 MG PO TABS
1.0000 | ORAL_TABLET | ORAL | Status: AC
  Administered 2018-10-21: 1 via ORAL
  Filled 2018-10-21: qty 1

## 2018-10-21 MED ORDER — HYDROCODONE-ACETAMINOPHEN 5-325 MG PO TABS: 1.0000 | ORAL_TABLET | ORAL | 0 refills | Status: DC | PRN

## 2018-10-21 NOTE — ED Provider Notes (Signed)
[facility] EMERGENCY DEPARTMENT Provider Note   CSN: [id number] Arrival date & time: 10/21/18  2103     History   Chief Complaint Chief Complaint  Patient presents with  . Dental Pain    HPI Tonya Paul is a 43 y.o. female.  Presents to the emergency department for evaluation of right-sided dental pain.  Patient's right upper second molar appears to be cracked.  This is been cracked for several weeks.  She has been taking Tylenol for pain but today and yesterday pain is been moderate to severe.  She denies any fevers facial swelling or difficulty swallowing.  She has not followed up with her dentist.  No rashes.     HPI  Past Medical History:  Diagnosis Date  . Diabetes mellitus without complication ([place])   . Hemorrhoids 11/18/2015  . Hypertension   . Left breast mass 09/01/2015  . 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 Palpitations 02/03/2016   PVCs seen on monitor  . Vertigo     Patient Active Problem List   Diagnosis Date Noted  . Abdominal pain, chronic, epigastric   . GERD (gastroesophageal reflux disease) 06/20/2017  . Long Q-T syndrome 01/14/2017  . Palpitations 02/03/2016  . Uterine fibroid 12/11/2015  . Family history of heart disease 12/03/2015  . Hemorrhoids 11/18/2015  . Left breast mass 09/01/2015  . Obesity 09/01/2015  . Type 2 diabetes mellitus, uncontrolled ([place]) 04/16/2015    Past Surgical History:  Procedure Laterality Date  . BREAST BIOPSY Left 09/03/2015   [place] Core bx, benign  . ESOPHAGOGASTRODUODENOSCOPY (EGD) WITH PROPOFOL N/A 06/21/2017   Procedure: ESOPHAGOGASTRODUODENOSCOPY (EGD) WITH PROPOFOL;  Surgeon: Lin Landsman, MD;  Location: [facility];  Service: Gastroenterology;  Laterality: N/A;     OB History    Gravida  3   Para      Term      Preterm      AB  1   Living        SAB  1   TAB      Ectopic      Multiple      Live Births               Home Medications     Prior to Admission medications   Medication Sig Start Date End Date Taking? Authorizing Provider  amoxicillin (AMOXIL) 875 MG tablet Take 1 tablet (875 mg total) by mouth 2 (two) times daily. X 10 days 10/21/18   Duanne Guess, PA-C  Blood Glucose Monitoring Suppl (TRUE METRIX METER) DEVI 1 each by Does not apply route 3 (three) times daily before meals. 04/16/15   Charlott Rakes, MD  ferrous sulfate 324 (65 Fe) MG TBEC Take 1 tablet (325 mg total) by mouth 2 (two) times a day. 10/03/18 01/01/19  Gildardo Pounds, NP  glucose blood (RELION GLUCOSE TEST STRIPS) test strip Use as instructed 07/30/16   Alfonse Spruce, FNP  HYDROcodone-acetaminophen (NORCO) 5-325 MG tablet Take 1 tablet by mouth every 4 (four) hours as needed for moderate pain. 10/21/18   Duanne Guess, PA-C  Insulin Syringe-Needle U-100 (B-D INS SYRINGE 0.5CC/31GX5/16) 31G X 5/16" 0.5 ML MISC USE AS DIRECTED DAILY FOR INSULIN ADMINISTRATION 11/29/16   Alfonse Spruce, FNP  LANTUS SOLOSTAR 100 UNIT/ML Solostar Pen INJECT 25 UNITS INTO THE SKIN AT BEDTIME. 08/21/18   Gildardo Pounds, NP  metFORMIN (GLUCOPHAGE-XR) 500 MG 24 hr tablet TAKE 2 TABLETS (1,000  MG TOTAL) BY MOUTH 2 (TWO) TIMES DAILY. 06/06/18   Gildardo Pounds, NP  propranolol ER (INDERAL LA) 80 MG 24 hr capsule TAKE 1 CAPSULE BY MOUTH ONCE A DAY 09/27/18   Gildardo Pounds, NP  triamcinolone cream (KENALOG) 0.1 % Apply 1 application topically 2 (two) times daily. 07/07/18   Norval Gable, MD  TRUEPLUS PEN NEEDLES 31G X 5 MM MISC USE AS INSTRUCTED 08/18/18   Gildardo Pounds, NP    Family History Family History  Problem Relation Age of Onset  . Hypertension Mother   . Lung cancer Mother   . Throat cancer Father   . Diabetes Sister   . Lupus Maternal Grandmother   . Stroke Maternal Grandfather   . Sudden Cardiac Death Daughter   . Breast cancer Paternal Aunt     Social History Social History   Tobacco Use  . Smoking status: Never Smoker  . Smokeless  tobacco: Never Used  Substance Use Topics  . Alcohol use: No  . Drug use: No     Allergies   Ace inhibitors, Aspirin, Rocephin [ceftriaxone], and Ciprofloxacin   Review of Systems Review of Systems  Constitutional: Negative.  Negative for chills and fever.  HENT: Positive for dental problem. Negative for drooling, facial swelling, mouth sores, trouble swallowing and voice change.   Respiratory: Negative for shortness of breath.   Cardiovascular: Negative for chest pain.  Gastrointestinal: Negative for nausea and vomiting.  Musculoskeletal: Negative for arthralgias, neck pain and neck stiffness.  Skin: Negative.   Psychiatric/Behavioral: Negative for confusion.  All other systems reviewed and are negative.    Physical Exam Updated Vital Signs BP (!) 166/91 (BP Location: Right Arm)   Pulse 82   Temp 99.3 F (37.4 C) (Oral)   Resp 17   Ht 5\' 10"  (1.778 m)   Wt 85.7 kg   LMP 10/07/2018 (Exact Date)   SpO2 99%   BMI 27.11 kg/m   Physical Exam Constitutional:      General: She is not in acute distress.    Appearance: She is well-developed.  HENT:     Head: Normocephalic and atraumatic.     Jaw: No trismus.     Right Ear: External ear normal.     Left Ear: External ear normal.     Nose: Nose normal.     Mouth/Throat:     Mouth: No oral lesions.     Dentition: Normal dentition.     Pharynx: Uvula midline. No uvula swelling.   Neck:     Musculoskeletal: Normal range of motion and neck supple.  Cardiovascular:     Rate and Rhythm: Normal rate.     Heart sounds: No murmur. No friction rub. No gallop.   Pulmonary:     Effort: Pulmonary effort is normal. No respiratory distress.     Breath sounds: Normal breath sounds.  Skin:    General: Skin is warm and dry.  Neurological:     Mental Status: She is alert and oriented to person, place, and time.  Psychiatric:        Behavior: Behavior normal.        Thought Content: Thought content normal.      ED  Treatments / Results  Labs (all labs ordered are listed, but only abnormal results are displayed) Labs Reviewed - No data to display  EKG None  Radiology No results found.  Procedures Procedures (including critical care time)  Medications Ordered in ED Medications  HYDROcodone-acetaminophen (NORCO/VICODIN)  5-325 MG per tablet 1 tablet (has no administration in time range)  amoxicillin (AMOXIL) capsule 1,000 mg (has no administration in time range)     Initial Impression / Assessment and Plan / ED Course  I have reviewed the triage vital signs and the nursing notes.  Pertinent labs & imaging results that were available during my care of the patient were reviewed by me and considered in my medical decision making (see chart for details).        43 year old female with right upper dental pain.  Cracked the tooth several weeks ago.  She is having pain and discomfort.  No swelling or fevers.  No drainage.  Vital signs are stable.  She is given a prescription for amoxicillin and Norco.  She will follow-up dental clinic.  Final Clinical Impressions(s) / ED Diagnoses   Final diagnoses:  Toothache  Closed fracture of tooth, initial encounter    ED Discharge Orders         Ordered    amoxicillin (AMOXIL) 875 MG tablet  2 times daily     10/21/18 2207    HYDROcodone-acetaminophen (NORCO) 5-325 MG tablet  Every 4 hours PRN     10/21/18 2207           Renata Caprice 10/21/18 2212    Duffy Bruce, MD 10/27/18 [phone]

## 2018-10-21 NOTE — ED Triage Notes (Signed)
Pt reports broken tooth on top right side of her mouth for a few weeks; pain increased last night; taking only Tylenol with no relief; pt says the pain is making her nauseated and giving her a headache;

## 2018-10-21 NOTE — Discharge Instructions (Addendum)
Please take medications as prescribed and follow-up with dental clinic.  Return to the ER for any fevers worsening symptoms or urgent changes in your health.

## 2018-10-22 ENCOUNTER — Other Ambulatory Visit: Payer: Self-pay | Admitting: Nurse Practitioner

## 2018-10-22 DIAGNOSIS — I10 Essential (primary) hypertension: Secondary | ICD-10-CM

## 2018-10-23 ENCOUNTER — Telehealth: Payer: Self-pay | Admitting: Physician Assistant

## 2018-10-23 DIAGNOSIS — L03019 Cellulitis of unspecified finger: Secondary | ICD-10-CM

## 2018-10-23 MED ORDER — PROPRANOLOL HCL ER 80 MG PO CP24: 80.0000 mg | ORAL_CAPSULE | Freq: Every day | ORAL | 0 refills | Status: DC

## 2018-10-23 MED FILL — PROPRANOLOL ER 80 MG CAP: 80 | 30 days supply | Qty: 30 | Fill #0

## 2018-10-23 NOTE — Progress Notes (Signed)
Based on what you shared with me, I feel your condition warrants further evaluation and I recommend that you be seen for a face to face office visit.  NOTE: If you entered your credit card information for this eVisit, you will not be charged. You may see a "hold" on your card for the $35 but that hold will drop off and you will not have a charge processed.  The swelling and yellow discoloration beside the nailbed may require draining if there is any abscess formation. This requires a face to face evaluation to assess the need for draining.  If you are having a true medical emergency please call 911.     For an urgent face to face visit, [facility] has five urgent care centers for your convenience:    [email or web address] to reserve your spot online an avoid wait times  [facility] (New Address!) [address], [place], [place] [address] *Just off [address], across the road from [place] hours of operation: Monday-Friday, 12 PM to 6 PM  Closed Saturday & Sunday   The following sites will take your insurance:  . [facility]    [phone]                  Get Driving Directions  [address], [place] [address] . 10 am to 8 pm Monday-Friday . 12 pm to 8 pm Saturday-Sunday   . [facility] at [place]                  Get Driving Directions  [address], [place], [place] [address] . 8 am to 8 pm Monday-Friday . 9 am to 6 pm Saturday . 11 am to 6 pm Sunday   . [facility] at [place]                  Get Driving Directions   [address], [place] [address] . 8 am to 8 pm Monday-Friday . 8 am to 4 pm Saturday-Sunday    . [facility] at [place]                    Get Driving Directions  [phone]  [address], [place], [place] [address]  .  Monday-Friday, 12 PM to 6 PM    Your e-visit answers were reviewed by a board certified advanced clinical practitioner to complete your personal care plan.  Thank you for using e-Visits. I spent 5-10 minutes on review and completion of this note- Lacy Duverney [facility]

## 2018-10-23 NOTE — Telephone Encounter (Signed)
NO additional refills until seen in office.

## 2018-10-26 MED FILL — metFORMIN HCL ER 500 MG TB2: 500 | 30 days supply | Qty: 120 | Fill #2

## 2018-11-01 ENCOUNTER — Encounter: Payer: Self-pay | Admitting: Nurse Practitioner

## 2018-11-06 ENCOUNTER — Other Ambulatory Visit: Payer: Self-pay | Admitting: Nurse Practitioner

## 2018-11-06 DIAGNOSIS — F419 Anxiety disorder, unspecified: Secondary | ICD-10-CM

## 2018-11-20 MED FILL — PROPRANOLOL ER 80 MG CAP: 80 | 30 days supply | Qty: 30 | Fill #0

## 2018-11-22 ENCOUNTER — Encounter: Payer: Self-pay | Admitting: Licensed Clinical Social Worker

## 2018-11-24 ENCOUNTER — Other Ambulatory Visit: Payer: Self-pay | Admitting: Nurse Practitioner

## 2018-11-24 DIAGNOSIS — I10 Essential (primary) hypertension: Secondary | ICD-10-CM

## 2018-12-08 NOTE — Progress Notes (Signed)
Late Entry 11/22/2018  Call placed to patient. LCSW introduced self and explained role at [facility]. Pt was informed of referral from PCP.   Pt shared that her anxiety is "all over the place" States that she experiences panic attacks every other day, usually triggered when she needs to drive or while she is sleeping.  Pt shared that her son recently enlisted in [employer] and was diagnosed with a enlarged heart. Pt's daughter passed at 17 years of age due to an undetected heart condition. Pt feels that this has negatively impacted her anxiety.   LCSW provided support and validation. The correlation between one's physical and mental health was discussed, in addition, to healthy coping skills to assist in the decrease of symptoms.    Pt has an upcoming appointment with PCP scheduled for 12/12/2018. If symptoms haven't decreased, pt will address concerns with PCP to discuss appropriateness for medication management

## 2018-12-12 ENCOUNTER — Encounter: Payer: Self-pay | Admitting: Nurse Practitioner

## 2018-12-12 ENCOUNTER — Other Ambulatory Visit: Payer: Self-pay

## 2018-12-12 ENCOUNTER — Ambulatory Visit: Payer: Medicaid Other | Attending: Nurse Practitioner | Admitting: Nurse Practitioner

## 2018-12-12 ENCOUNTER — Other Ambulatory Visit: Payer: Self-pay | Admitting: Nurse Practitioner

## 2018-12-12 DIAGNOSIS — Z801 Family history of malignant neoplasm of trachea, bronchus and lung: Secondary | ICD-10-CM | POA: Insufficient documentation

## 2018-12-12 DIAGNOSIS — Z794 Long term (current) use of insulin: Secondary | ICD-10-CM | POA: Insufficient documentation

## 2018-12-12 DIAGNOSIS — Z760 Encounter for issue of repeat prescription: Secondary | ICD-10-CM | POA: Insufficient documentation

## 2018-12-12 DIAGNOSIS — D649 Anemia, unspecified: Secondary | ICD-10-CM

## 2018-12-12 DIAGNOSIS — I10 Essential (primary) hypertension: Secondary | ICD-10-CM

## 2018-12-12 DIAGNOSIS — R002 Palpitations: Secondary | ICD-10-CM | POA: Insufficient documentation

## 2018-12-12 DIAGNOSIS — Z803 Family history of malignant neoplasm of breast: Secondary | ICD-10-CM | POA: Insufficient documentation

## 2018-12-12 DIAGNOSIS — Z823 Family history of stroke: Secondary | ICD-10-CM | POA: Insufficient documentation

## 2018-12-12 DIAGNOSIS — E1165 Type 2 diabetes mellitus with hyperglycemia: Secondary | ICD-10-CM | POA: Insufficient documentation

## 2018-12-12 DIAGNOSIS — Z800 Family history of malignant neoplasm of digestive organs: Secondary | ICD-10-CM | POA: Insufficient documentation

## 2018-12-12 DIAGNOSIS — Z833 Family history of diabetes mellitus: Secondary | ICD-10-CM | POA: Insufficient documentation

## 2018-12-12 DIAGNOSIS — Z8249 Family history of ischemic heart disease and other diseases of the circulatory system: Secondary | ICD-10-CM | POA: Insufficient documentation

## 2018-12-12 NOTE — Progress Notes (Signed)
Virtual Visit via Telephone Note Due to national recommendations of social distancing due to [place] 19, telehealth visit is felt to be most appropriate for this patient at this time.  I discussed the limitations, risks, security and privacy concerns of performing an evaluation and management service by telephone and the availability of in person appointments. I also discussed with the patient that there may be a patient responsible charge related to this service. The patient expressed understanding and agreed to proceed.    I connected with [name] on 12/12/18  at   2:50 PM EDT  EDT by telephone and verified that I am speaking with the correct person using two identifiers.   Consent I discussed the limitations, risks, security and privacy concerns of performing an evaluation and management service by telephone and the availability of in person appointments. I also discussed with the patient that there may be a patient responsible charge related to this service. The patient expressed understanding and agreed to proceed.   Location of Patient: Private Residence   Location of Provider: [place] and [employer] Office    Persons participating in Telemedicine visit: Geryl Rankins FNP-BC [place]    History of Present Illness: Telemedicine visit for: DM TYPE 2 Unfortunately she brought a family member to the appointment today and the office visit had to be switched to a telehealth visit due to covid policy.  She will make lab appointment for next week.   has a past medical history of Diabetes mellitus without complication ([place]), Hemorrhoids (11/18/2015), Hypertension, Left breast mass (09/01/2015), Long Q-T syndrome (01/14/2017), Palpitations (02/03/2016), and Vertigo. She ha DM TYPE 2 Taking Lantus 25 units at bedtime and metformin XR 1000 mg BID as prescribed. Monitoring blood glucose levels twice per day. Average fasting: 130-150s. Postprandial: 200s. Which  she reports is mainly due to dietary noncompliance. Will continue on current medications for now.  Lab Results  Component Value Date   HGBA1C 6.7 (A) 05/15/2018   Insomnia Requesting medication to help her sleep however she is leery of taking most medications due to her history or prolonged QT syndrome. I have recommended melatonin at this time. She also inquired about melatonin and CBD combination. I have instructed her that I can not give her any recommendations in regard to CBD and melatonin combinations.   Essential Hypertension She does not monitor her blood pressure at home. Will need office visit for BP check. Last elevated reading was taken during an ED visit for toothache/broken tooth. She currently denies chest pain, shortness of breath, or BLE edema. She does endorse palpitations and dizziness for which she takes propranolol and dramamine. In June she had a 14 day ZIO XT long term holter monitor mailed to her home. I am unable to find any records of those results. She states the dizziness is not new and may be related to her anxiety however she is not sure. She denies any falls. She also has seasonal allergies but does not take any antihistamines for this. I prescribed her hydroxyzine in the past for her anxiety but she states she was instructed by cardiology not to take this due to her cardiac history. Event monitor performed 12/08/15 showed sinus rhythm with occasional PVCs.  BP Readings from Last 3 Encounters:  10/21/18 (!) 166/91  08/18/18 134/81  08/14/18 134/81    Past Medical History:  Diagnosis Date  . Diabetes mellitus without complication ([place])   . Hemorrhoids 11/18/2015  . Hypertension   . Left  breast mass 09/01/2015  . 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 Palpitations 02/03/2016   PVCs seen on monitor  . Vertigo     Past Surgical History:  Procedure Laterality Date  . BREAST BIOPSY Left 09/03/2015   [place] Core bx, benign  . ESOPHAGOGASTRODUODENOSCOPY  (EGD) WITH PROPOFOL N/A 06/21/2017   Procedure: ESOPHAGOGASTRODUODENOSCOPY (EGD) WITH PROPOFOL;  Surgeon: Lin Landsman, MD;  Location: [facility];  Service: Gastroenterology;  Laterality: N/A;    Family History  Problem Relation Age of Onset  . Hypertension Mother   . Lung cancer Mother   . Throat cancer Father   . Diabetes Sister   . Lupus Maternal Grandmother   . Stroke Maternal Grandfather   . Sudden Cardiac Death Daughter   . Breast cancer Paternal Aunt     Social History   Socioeconomic History  . Marital status: Single    Spouse name: Not on file  . Number of children: Not on file  . Years of education: Not on file  . Highest education level: Not on file  Occupational History  . Not on file  Social Needs  . Financial resource strain: Not on file  . Food insecurity    Worry: Not on file    Inability: Not on file  . Transportation needs    Medical: Not on file    Non-medical: Not on file  Tobacco Use  . Smoking status: Never Smoker  . Smokeless tobacco: Never Used  Substance and Sexual Activity  . Alcohol use: No  . Drug use: No  . Sexual activity: Yes    Birth control/protection: None  Lifestyle  . Physical activity    Days per week: Not on file    Minutes per session: Not on file  . Stress: Not on file  Relationships  . Social Herbalist on phone: Not on file    Gets together: Not on file    Attends religious service: Not on file    Active member of club or organization: Not on file    Attends meetings of clubs or organizations: Not on file    Relationship status: Not on file  Other Topics Concern  . Not on file  Social History Narrative  . Not on file     Observations/Objective: Awake, alert and oriented x 3   Review of Systems  Constitutional: Negative for fever, malaise/fatigue and weight loss.  HENT: Negative.  Negative for nosebleeds.   Eyes: Negative.  Negative for blurred vision, double vision and photophobia.   Respiratory: Negative.  Negative for cough and shortness of breath.   Cardiovascular: Positive for palpitations. Negative for chest pain and leg swelling.  Gastrointestinal: Negative.  Negative for heartburn, nausea and vomiting.  Musculoskeletal: Negative.  Negative for myalgias.  Neurological: Positive for dizziness. Negative for focal weakness, seizures and headaches.  Psychiatric/Behavioral: Negative for suicidal ideas. The patient is nervous/anxious and has insomnia.     Assessment and Plan: Jarianna was seen today for medication refill.  Diagnoses and all orders for this visit:  Uncontrolled type 2 diabetes mellitus with hyperglycemia ([place]) Continue blood sugar control as discussed in office today, low carbohydrate diet, and regular physical exercise as tolerated, 150 minutes per week (30 min each day, 5 days per week, or 50 min 3 days per week). Keep blood sugar logs with fasting goal of 90-130 mg/dl, post prandial (after you eat) less than 180.  For Hypoglycemia: BS <60 and Hyperglycemia  BS >400; contact the clinic ASAP. Annual eye exams and foot exams are recommended.   Essential hypertension Continue all antihypertensives as prescribed.  Remember to bring in your blood pressure log with you for your follow up appointment.  DASH/Mediterranean Diets are healthier choices for HTN.   Palpitations Continue propranolol ER 80mg   as prescribed.     Follow Up Instructions Return in about 2 months (around 02/11/2019).     I discussed the assessment and treatment plan with the patient. The patient was provided an opportunity to ask questions and all were answered. The patient agreed with the plan and demonstrated an understanding of the instructions.   The patient was advised to call back or seek an in-person evaluation if the symptoms worsen or if the condition fails to improve as anticipated.  I provided 16 minutes of non-face-to-face time during this encounter including median  intraservice time, reviewing previous notes, labs, imaging, medications and explaining diagnosis and management.  Gildardo Pounds, FNP-BC

## 2018-12-15 ENCOUNTER — Other Ambulatory Visit: Payer: Medicaid Other

## 2018-12-18 ENCOUNTER — Other Ambulatory Visit: Payer: Self-pay

## 2018-12-18 ENCOUNTER — Ambulatory Visit (INDEPENDENT_AMBULATORY_CARE_PROVIDER_SITE_OTHER): Payer: Medicaid Other | Admitting: Family Medicine

## 2018-12-18 ENCOUNTER — Other Ambulatory Visit (HOSPITAL_COMMUNITY)
Admission: RE | Admit: 2018-12-18 | Discharge: 2018-12-18 | Disposition: A | Discharge status: Home / Self Care | Payer: Medicaid Other | Source: Ambulatory Visit | Attending: Family Medicine | Admitting: Family Medicine

## 2018-12-18 ENCOUNTER — Encounter: Payer: Self-pay | Admitting: Family Medicine

## 2018-12-18 VITALS — BP 134/84 | HR 73 | Temp 98.2°F | Wt 205.0 lb

## 2018-12-18 DIAGNOSIS — Z0000 Encounter for general adult medical examination without abnormal findings: Secondary | ICD-10-CM | POA: Diagnosis not present

## 2018-12-18 DIAGNOSIS — N6320 Unspecified lump in the left breast, unspecified quadrant: Secondary | ICD-10-CM

## 2018-12-18 DIAGNOSIS — N920 Excessive and frequent menstruation with regular cycle: Secondary | ICD-10-CM

## 2018-12-18 DIAGNOSIS — Z01419 Encounter for gynecological examination (general) (routine) without abnormal findings: Secondary | ICD-10-CM

## 2018-12-18 NOTE — Addendum Note (Signed)
Addended by: Kandyce Rud on: 12/18/2018 07:15 PM   Modules accepted: Orders

## 2018-12-18 NOTE — Progress Notes (Signed)
GYNECOLOGY ANNUAL PREVENTATIVE CARE ENCOUNTER NOTE  Subjective:   Tonya Paul is a 43 y.o. G35P2011 female here for a routine annual gynecologic exam.  Current complaints: anemia and menorrhagia with regular menses.   Denies abnormal vaginal bleeding, discharge, pelvic pain, problems with intercourse or other gynecologic concerns.    Gynecologic History Patient's last menstrual period was 11/23/2018. Last Pap: > 3 years ago. Results were: normal Last mammogram: 2020. Results were: normal  Obstetric History OB History  Gravida Para Term Preterm AB Living  3 2 2   1 1   SAB TAB Ectopic Multiple Live Births  1       2    # Outcome Date GA Lbr Len/2nd Weight Sex Delivery Anes PTL Lv  3 Term      Vag-Spont  N LIV  2 Term      Vag-Spont None  DEC  1 SAB             Past Medical History:  Diagnosis Date  . Diabetes mellitus without complication ([place])   . Hemorrhoids 11/18/2015  . Hypertension   . Left breast mass 09/01/2015  . 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 Palpitations 02/03/2016   PVCs seen on monitor  . Vertigo     Past Surgical History:  Procedure Laterality Date  . BREAST BIOPSY Left 09/03/2015   [place] Core bx, benign  . ESOPHAGOGASTRODUODENOSCOPY (EGD) WITH PROPOFOL N/A 06/21/2017   Procedure: ESOPHAGOGASTRODUODENOSCOPY (EGD) WITH PROPOFOL;  Surgeon: Lin Landsman, MD;  Location: [facility];  Service: Gastroenterology;  Laterality: N/A;    Current Outpatient Medications on File Prior to Visit  Medication Sig Dispense Refill  . Blood Glucose Monitoring Suppl (TRUE METRIX METER) DEVI 1 each by Does not apply route 3 (three) times daily before meals. 1 Device 0  . ferrous sulfate 324 (65 Fe) MG TBEC Take 1 tablet (325 mg total) by mouth 2 (two) times a day. (Patient not taking: Reported on 12/12/2018) 180 tablet 1  . glucose blood (RELION GLUCOSE TEST STRIPS) test strip Use as instructed 100 each 12  . Insulin Syringe-Needle U-100 (B-D INS  SYRINGE 0.5CC/31GX5/16) 31G X 5/16" 0.5 ML MISC USE AS DIRECTED DAILY FOR INSULIN ADMINISTRATION 100 each 1  . LANTUS SOLOSTAR 100 UNIT/ML Solostar Pen INJECT 25 UNITS INTO THE SKIN AT BEDTIME. 15 mL 5  . metFORMIN (GLUCOPHAGE-XR) 500 MG 24 hr tablet TAKE 2 TABLETS (1,000 MG TOTAL) BY MOUTH 2 (TWO) TIMES DAILY. 120 tablet 2  . propranolol ER (INDERAL LA) 80 MG 24 hr capsule Take 1 capsule (80 mg total) by mouth daily. 30 capsule 0  . triamcinolone cream (KENALOG) 0.1 % Apply 1 application topically 2 (two) times daily. 30 g 0  . TRUEPLUS PEN NEEDLES 31G X 5 MM MISC USE AS INSTRUCTED 100 each 1   No current facility-administered medications on file prior to visit.     Allergies  Allergen Reactions  . Ace Inhibitors Shortness Of Breath    Reported by the patient after a trial of lisinopril prescribes by PCP  . Aspirin Anaphylaxis  . Rocephin [Ceftriaxone] Itching  . Ciprofloxacin Itching  . Prednisone Palpitations    Social History   Socioeconomic History  . Marital status: Single    Spouse name: Not on file  . Number of children: Not on file  . Years of education: Not on file  . Highest education level: Not on file  Occupational History  . Not on  file  Social Needs  . Financial resource strain: Not on file  . Food insecurity    Worry: Not on file    Inability: Not on file  . Transportation needs    Medical: Not on file    Non-medical: Not on file  Tobacco Use  . Smoking status: Never Smoker  . Smokeless tobacco: Never Used  Substance and Sexual Activity  . Alcohol use: No  . Drug use: No  . Sexual activity: Yes    Birth control/protection: None  Lifestyle  . Physical activity    Days per week: Not on file    Minutes per session: Not on file  . Stress: Not on file  Relationships  . Social Herbalist on phone: Not on file    Gets together: Not on file    Attends religious service: Not on file    Active member of club or organization: Not on file     Attends meetings of clubs or organizations: Not on file    Relationship status: Not on file  . Intimate partner violence    Fear of current or ex partner: Not on file    Emotionally abused: Not on file    Physically abused: Not on file    Forced sexual activity: Not on file  Other Topics Concern  . Not on file  Social History Narrative  . Not on file    Family History  Problem Relation Age of Onset  . Hypertension Mother   . Lung cancer Mother   . Throat cancer Father   . Diabetes Sister   . Lupus Maternal Grandmother   . Stroke Maternal Grandfather   . Sudden Cardiac Death Daughter   . Breast cancer Paternal Aunt     The following portions of the patient's history were reviewed and updated as appropriate: allergies, current medications, past family history, past medical history, past social history, past surgical history and problem list.  Review of Systems Pertinent items are noted in HPI.   Objective:  BP 134/84   Pulse 73   Temp 98.2 F (36.8 C)   Wt 205 lb (93 kg)   LMP 11/23/2018   BMI 29.41 kg/m  Wt Readings from Last 3 Encounters:  12/18/18 205 lb (93 kg)  10/21/18 188 lb 15 oz (85.7 kg)  08/18/18 201 lb (91.2 kg)     CONSTITUTIONAL: Well-developed, well-nourished female in no acute distress.  HENT:  Normocephalic, atraumatic, External right and left ear normal. Oropharynx is clear and moist EYES: Conjunctivae and EOM are normal. Pupils are equal, round, and reactive to light. No scleral icterus.  NECK: Normal range of motion, supple, no masses.  Normal thyroid.   CARDIOVASCULAR: Normal heart rate noted, regular rhythm RESPIRATORY: Clear to auscultation bilaterally. Effort and breath sounds normal, no problems with respiration noted. BREASTS: declined ABDOMEN: Soft, normal bowel sounds, no distention noted.  No tenderness, rebound or guarding.  PELVIC: Normal appearing external genitalia; normal appearing vaginal mucosa and cervix.  No abnormal discharge  noted.  MUSCULOSKELETAL: Normal range of motion. No tenderness.  No cyanosis, clubbing, or edema.  2+ distal pulses. SKIN: Skin is warm and dry. No rash noted. Not diaphoretic. No erythema. No pallor. NEUROLOGIC: Alert and oriented to person, place, and time. Normal reflexes, muscle tone coordination. No cranial nerve deficit noted. PSYCHIATRIC: Normal mood and affect. Normal behavior. Normal judgment and thought content.  Assessment:  Annual gynecologic examination with pap smear   Plan:  1.  Well woman exam with routine gynecological exam PAP done - will follow results  2. Left breast mass Recent Mammogram  3. Menorrhagia with regular cycle Discussed options: IUD, OCP, Lysteda, Ablation, hysterectomy. Patient would like to think of options. F/u in 6 weeks. Has been prescribed iron.   Routine preventative health maintenance measures emphasized. Please refer to After Visit Summary for other counseling recommendations.    Loma Boston, [place] for [employer]

## 2018-12-22 LAB — CYTOLOGY - PAP
Diagnosis: NEGATIVE
High risk HPV: NEGATIVE

## 2018-12-25 ENCOUNTER — Other Ambulatory Visit: Payer: Self-pay | Admitting: Nurse Practitioner

## 2018-12-25 DIAGNOSIS — I10 Essential (primary) hypertension: Secondary | ICD-10-CM

## 2018-12-25 MED FILL — PROPRANOLOL ER 80 MG CAP: 80 | 30 days supply | Qty: 30 | Fill #0

## 2018-12-27 ENCOUNTER — Encounter: Payer: Self-pay | Admitting: Nurse Practitioner

## 2019-01-03 ENCOUNTER — Other Ambulatory Visit: Payer: Self-pay | Admitting: Nurse Practitioner

## 2019-01-03 ENCOUNTER — Encounter: Payer: Self-pay | Admitting: Nurse Practitioner

## 2019-01-03 DIAGNOSIS — E1165 Type 2 diabetes mellitus with hyperglycemia: Secondary | ICD-10-CM

## 2019-01-03 MED FILL — METFORMIN HCL ER 500 MG TB2: 500 | 30 days supply | Qty: 120 | Fill #0

## 2019-01-03 MED FILL — $LANTUS SOLOSTAR 100 UNITS/: 100 | 36 days supply | Qty: 9 | Fill #1

## 2019-01-03 NOTE — Telephone Encounter (Signed)
Please fill if appropriate.  

## 2019-01-05 ENCOUNTER — Ambulatory Visit: Payer: Self-pay | Attending: Nurse Practitioner

## 2019-01-05 ENCOUNTER — Ambulatory Visit (INDEPENDENT_AMBULATORY_CARE_PROVIDER_SITE_OTHER): Payer: Self-pay | Admitting: Cardiovascular Disease

## 2019-01-05 ENCOUNTER — Other Ambulatory Visit: Payer: Self-pay

## 2019-01-05 ENCOUNTER — Encounter: Payer: Self-pay | Admitting: Cardiovascular Disease

## 2019-01-05 DIAGNOSIS — D649 Anemia, unspecified: Secondary | ICD-10-CM

## 2019-01-05 DIAGNOSIS — Z8249 Family history of ischemic heart disease and other diseases of the circulatory system: Secondary | ICD-10-CM

## 2019-01-05 DIAGNOSIS — I10 Essential (primary) hypertension: Secondary | ICD-10-CM

## 2019-01-05 DIAGNOSIS — E1165 Type 2 diabetes mellitus with hyperglycemia: Secondary | ICD-10-CM

## 2019-01-05 DIAGNOSIS — R002 Palpitations: Secondary | ICD-10-CM

## 2019-01-05 NOTE — Patient Instructions (Signed)
Medication Instructions:  Your physician recommends that you continue on your current medications as directed. Please refer to the Current Medication list given to you today.  If you need a refill on your cardiac medications before your next appointment, please call your pharmacy.   Lab work: none If you have labs (blood work) drawn today and your tests are completely normal, you will receive your results only by: Marland Kitchen MyChart Message (if you have MyChart) OR . A paper copy in the mail If you have any lab test that is abnormal or we need to change your treatment, we will call you to review the results.  Testing/Procedures: Your physician has requested that you have an echocardiogram. Echocardiography is a painless test that uses sound waves to create images of your heart. It provides your doctor with information about the size and shape of your heart and how well your heart's chambers and valves are working. This procedure takes approximately one hour. There are no restrictions for this procedure. LOCATION: [facility]: [place], [place], [place] [address]   Your physician has recommended that you have a coronary calcium scan. This will cost $150 out-of-pocket. LOCATION: [facility]: [place], [place],  [address]   Coronary Calcium Scan A coronary calcium scan is an imaging test used to look for deposits of calcium and other fatty materials (plaques) in the inner lining of the blood vessels of the heart (coronary arteries). These deposits of calcium and plaques can partly clog and narrow the coronary arteries without producing any symptoms or warning signs. This puts a person at risk for a heart attack. This test can detect these deposits before symptoms develop. Tell a health care provider about:  Any allergies you have.  All medicines you are taking, including vitamins, herbs, eye drops, creams, and over-the-counter medicines.   Any problems you or family members have had with anesthetic medicines.  Any blood disorders you have.  Any surgeries you have had.  Any medical conditions you have.  Whether you are pregnant or may be pregnant. What are the risks? Generally, this is a safe procedure. However, problems may occur, including:  Harm to a pregnant woman and her unborn baby. This test involves the use of radiation. Radiation exposure can be dangerous to a pregnant woman and her unborn baby. If you are pregnant, you generally should not have this procedure done.  Slight increase in the risk of cancer. This is because of the radiation involved in the test. What happens before the procedure? No preparation is needed for this procedure. What happens during the procedure?   You will undress and remove any jewelry around your neck or chest.  You will put on a hospital gown.  Sticky electrodes will be placed on your chest. The electrodes will be connected to an electrocardiogram (ECG) machine to record a tracing of the electrical activity of your heart.  A CT scanner will take pictures of your heart. During this time, you will be asked to lie still and hold your breath for 2-3 seconds while a picture of your heart is being taken. The procedure may vary among health care providers and hospitals. What happens after the procedure?  You can get dressed.  You can return to your normal activities.  It is up to you to get the results of your test. Ask your health care provider, or the department that is doing the test, when your results will  be ready. Summary  A coronary calcium scan is an imaging test used to look for deposits of calcium and other fatty materials (plaques) in the inner lining of the blood vessels of the heart (coronary arteries).  Generally, this is a safe procedure. Tell your health care provider if you are pregnant or may be pregnant.  No preparation is needed for this procedure.  A CT  scanner will take pictures of your heart.  You can return to your normal activities after the scan is done. This information is not intended to replace advice given to you by your health care provider. Make sure you discuss any questions you have with your health care provider.   Your physician has recommended that you wear a 14 DAY ZIO-PATCH monitor. The Zio patch cardiac monitor continuously records heart rhythm data for up to 14 days, this is for patients being evaluated for multiple types heart rhythms. For the first 24 hours post application, please avoid getting the Zio monitor wet in the shower or by excessive sweating during exercise. After that, feel free to carry on with regular activities. Keep soaps and lotions away from the ZIO XT Patch.  This will be placed at our [facility] location - [address] or mailed to you.         Follow-Up: At [facility], you and your health needs are our priority.  As part of our continuing mission to provide you with exceptional heart care, we have created designated Provider Care Teams.  These Care Teams include your primary Cardiologist (physician) and Advanced Practice Providers (APPs -  Physician Assistants and Nurse Practitioners) who all work together to provide you with the care you need, when you need it. . You will need a follow up appointment in 6 months with an APP and in 12 months with Dr. Quay Burow.  Please call our office 2 months in advance to schedule this/each appointment.  You may see one of the following Advanced Practice Providers on your designated Care Team:   . Kerin Ransom, PA-C . Daleen Snook Kroeger, PA-C . Sande Rives, PA-C

## 2019-01-05 NOTE — Progress Notes (Signed)
01/05/2019 [name]   02/10/1976  [id number]  Primary Physician Tonya Pounds, NP Primary Cardiologist: Tonya Harp MD Tonya Paul, [place]  HPI:  Tonya Paul is a 43 y.o.  moderately overweight African-American female , mother of one25year-old son, Tonya Paul was self-referred for evaluation of inheritable causes of sudden cardiac death.  Her son Tonya Paul she was recently discharged in the [facility] age 30 for medical reasons.  There is a question according to the patient that he had a "myocardial infarction".  I last saw her via video virtual telemedicine visit on 08/18/2018.  Unfortunately, her 45 year old daughter died suddenly 11-28-2012 of unclear reasons. Her fourth cousin did as well. Her only risk factor for heart disease as type 2 diabetes. She's never had a heart attack and stroke. She does get occasional shortness of breath but denies chest pain. She's had palpitations that occur daily over the last several months. She does not drink caffeine or alcohol. A 2-D echo was performed that showed normal LV function with mild to moderate TR. I referred her to Dr. Caryl Paul to evaluate her for inherited causes of sudden death such as "Channelopathies ". A routine GXTwas performed by Dr. Caryl Paul and this was entirely normal other than exercise-induced prolongation of her QT interval somewhat suggestive of long QT syndrome. Her son Tonya Paul was gently tested at [facility] and didn't not carry the gene. She has complained of occasional atypical chest pain which sounds musculoskeletal.  Since I saw her 5 months ago she is done well.  She has complained of increasing palpitations although she does not drink caffeine or any other stimulants as well as dizziness.  She is currently on Dramamine for this and was told she had "vertigo".  She also complains of some shortness of breath.    Current Meds  Medication Sig  . Blood Glucose Monitoring Suppl (TRUE METRIX METER) DEVI 1 each by Does  not apply route 3 (three) times daily before meals.  Marland Kitchen glucose blood (RELION GLUCOSE TEST STRIPS) test strip Use as instructed  . Insulin Syringe-Needle U-100 (B-D INS SYRINGE 0.5CC/31GX5/16) 31G X 5/16" 0.5 ML MISC USE AS DIRECTED DAILY FOR INSULIN ADMINISTRATION  . LANTUS SOLOSTAR 100 UNIT/ML Solostar Pen INJECT 25 UNITS INTO THE SKIN AT BEDTIME.  . metFORMIN (GLUCOPHAGE-XR) 500 MG 24 hr tablet TAKE 2 TABLETS (1,000 MG TOTAL) BY MOUTH 2 (TWO) TIMES DAILY.  Marland Kitchen propranolol ER (INDERAL LA) 80 MG 24 hr capsule TAKE 1 CAPSULE BY MOUTH ONCE A DAY. NEEDS OFFICE VISIT FOR MORE REFILLS.  Marland Kitchen TRUEPLUS PEN NEEDLES 31G X 5 MM MISC USE AS INSTRUCTED     Allergies  Allergen Reactions  . Ace Inhibitors Shortness Of Breath    Reported by the patient after a trial of lisinopril prescribes by PCP  . Aspirin Anaphylaxis  . Rocephin [Ceftriaxone] Itching  . Ciprofloxacin Itching  . Prednisone Palpitations    Social History   Socioeconomic History  . Marital status: Single    Spouse name: Not on file  . Number of children: Not on file  . Years of education: Not on file  . Highest education level: Not on file  Occupational History  . Not on file  Social Needs  . Financial resource strain: Not on file  . Food insecurity    Worry: Not on file    Inability: Not on file  . Transportation needs    Medical: Not on file    Non-medical: Not on file  Tobacco Use  . Smoking status: Never Smoker  . Smokeless tobacco: Never Used  Substance and Sexual Activity  . Alcohol use: No  . Drug use: No  . Sexual activity: Yes    Birth control/protection: None  Lifestyle  . Physical activity    Days per week: Not on file    Minutes per session: Not on file  . Stress: Not on file  Relationships  . Social Herbalist on phone: Not on file    Gets together: Not on file    Attends religious service: Not on file    Active member of club or organization: Not on file    Attends meetings of clubs or  organizations: Not on file    Relationship status: Not on file  . Intimate partner violence    Fear of current or ex partner: Not on file    Emotionally abused: Not on file    Physically abused: Not on file    Forced sexual activity: Not on file  Other Topics Concern  . Not on file  Social History Narrative  . Not on file     Review of Systems: General: negative for chills, fever, night sweats or weight changes.  Cardiovascular: negative for chest pain, dyspnea on exertion, edema, orthopnea, palpitations, paroxysmal nocturnal dyspnea or shortness of breath Dermatological: negative for rash Respiratory: negative for cough or wheezing Urologic: negative for hematuria Abdominal: negative for nausea, vomiting, diarrhea, bright red blood per rectum, melena, or hematemesis Neurologic: negative for visual changes, syncope, or dizziness All other systems reviewed and are otherwise negative except as noted above.    Blood pressure 134/90, pulse 71, temperature 98.1 F (36.7 C), height 5\' 10"  (1.778 m), weight 210 lb (95.3 kg).  General appearance: alert and no distress Neck: no adenopathy, no carotid bruit, no JVD, supple, symmetrical, trachea midline and thyroid not enlarged, symmetric, no tenderness/mass/nodules Lungs: clear to auscultation bilaterally Heart: regular rate and rhythm, S1, S2 normal, no murmur, click, rub or gallop Extremities: extremities normal, atraumatic, no cyanosis or edema Pulses: 2+ and symmetric Skin: Skin color, texture, turgor normal. No rashes or lesions Neurologic: Alert and oriented X 3, normal strength and tone. Normal symmetric reflexes. Normal coordination and gait  EKG sinus rhythm at 71 without ST or T wave changes. I  Personally reviewed this EKG.  ASSESSMENT AND PLAN:   Family history of heart disease Family history of heart disease with a daughter who died cardiac death at 20 years old, first cousin that died suddenly as well.  She has undergone  genetic testing ruling out Chandel apathies which was apparently negative.  Dr. Caryl Paul evaluated her and apparently she had a GXT that was entirely normal except for exercise-induced QT prolongation.  Palpitations History of daily palpitations and dizziness.  We will check a 2-week Zio patch      Tonya Harp MD [facility], [facility] 01/05/2019 10:09 AM

## 2019-01-05 NOTE — Assessment & Plan Note (Signed)
Family history of heart disease with a daughter who died cardiac death at 43 years old, first cousin that died suddenly as well.  She has undergone genetic testing ruling out Chandel apathies which was apparently negative.  Dr. Caryl Comes evaluated her and apparently she had a GXT that was entirely normal except for exercise-induced QT prolongation.

## 2019-01-05 NOTE — Assessment & Plan Note (Signed)
History of daily palpitations and dizziness.  We will check a 2-week Zio patch

## 2019-01-06 LAB — CBC WITH DIFFERENTIAL/PLATELET
Basophils Absolute: 0 10*3/uL (ref 0.0–0.2)
Basos: 0 %
EOS (ABSOLUTE): 0.3 10*3/uL (ref 0.0–0.4)
Eos: 4 %
Hematocrit: 32.1 % — ABNORMAL LOW (ref 34.0–46.6)
Hemoglobin: 10.8 g/dL — ABNORMAL LOW (ref 11.1–15.9)
Immature Grans (Abs): 0 10*3/uL (ref 0.0–0.1)
Immature Granulocytes: 0 %
Lymphocytes Absolute: 2.8 10*3/uL (ref 0.7–3.1)
Lymphs: 36 %
MCH: 25.7 pg — ABNORMAL LOW (ref 26.6–33.0)
MCHC: 33.6 g/dL (ref 31.5–35.7)
MCV: 76 fL — ABNORMAL LOW (ref 79–97)
Monocytes Absolute: 0.7 10*3/uL (ref 0.1–0.9)
Monocytes: 9 %
Neutrophils Absolute: 3.9 10*3/uL (ref 1.4–7.0)
Neutrophils: 51 %
Platelets: 513 10*3/uL — ABNORMAL HIGH (ref 150–450)
RBC: 4.21 x10E6/uL (ref 3.77–5.28)
RDW: 14.5 % (ref 11.7–15.4)
WBC: 7.7 10*3/uL (ref 3.4–10.8)

## 2019-01-06 LAB — LIPID PANEL
Chol/HDL Ratio: 4 ratio (ref 0.0–4.4)
Cholesterol, Total: 155 mg/dL (ref 100–199)
HDL: 39 mg/dL — ABNORMAL LOW (ref >39)
LDL Chol Calc (NIH): 98 mg/dL (ref 0–99)
Triglycerides: 96 mg/dL (ref 0–149)
VLDL Cholesterol Cal: 18 mg/dL (ref 5–40)

## 2019-01-06 LAB — CMP14+EGFR
ALT: 21 IU/L (ref 0–32)
AST: 19 IU/L (ref 0–40)
Albumin/Globulin Ratio: 1.4 (ref 1.2–2.2)
Albumin: 4.2 g/dL (ref 3.8–4.8)
Alkaline Phosphatase: 65 IU/L (ref 39–117)
BUN/Creatinine Ratio: 10 (ref 9–23)
BUN: 8 mg/dL (ref 6–24)
Bilirubin Total: <0.2 mg/dL (ref 0.0–1.2)
CO2: 22 mmol/L (ref 20–29)
Calcium: 9.6 mg/dL (ref 8.7–10.2)
Chloride: 103 mmol/L (ref 96–106)
Creatinine, Ser: 0.79 mg/dL (ref 0.57–1.00)
GFR calc Af Amer: 106 mL/min/{1.73_m2} (ref >59)
GFR calc non Af Amer: 92 mL/min/{1.73_m2} (ref >59)
Globulin, Total: 2.9 g/dL (ref 1.5–4.5)
Glucose: 176 mg/dL — ABNORMAL HIGH (ref 65–99)
Potassium: 4.5 mmol/L (ref 3.5–5.2)
Sodium: 137 mmol/L (ref 134–144)
Total Protein: 7.1 g/dL (ref 6.0–8.5)

## 2019-01-06 LAB — HEMOGLOBIN A1C
Est. average glucose Bld gHb Est-mCnc: 189 mg/dL
Hgb A1c MFr Bld: 8.2 % — ABNORMAL HIGH (ref 4.8–5.6)

## 2019-01-10 ENCOUNTER — Telehealth: Payer: Self-pay | Admitting: Nurse Practitioner

## 2019-01-10 ENCOUNTER — Encounter: Payer: Self-pay | Admitting: Nurse Practitioner

## 2019-01-10 ENCOUNTER — Other Ambulatory Visit: Payer: Self-pay | Admitting: Nurse Practitioner

## 2019-01-10 DIAGNOSIS — D75839 Thrombocytosis, unspecified: Secondary | ICD-10-CM

## 2019-01-10 DIAGNOSIS — D473 Essential (hemorrhagic) thrombocythemia: Secondary | ICD-10-CM

## 2019-01-10 MED ORDER — FERROUS SULFATE 324 (65 FE) MG PO TBEC: 1.0000 | DELAYED_RELEASE_TABLET | Freq: Two times a day (BID) | ORAL | 1 refills | Status: DC

## 2019-01-10 NOTE — Telephone Encounter (Signed)
Informed patient that results have not yet been released by her PCP. She has concerns about her platelet count.  States both parents are deceased secondary to cancer.   Patient instructed that she does not need to go to the ER. Will inform of results once her PCP release them.

## 2019-01-10 NOTE — Telephone Encounter (Signed)
New Message  Pt called stating she received her results through mychart and she has some concerning questions before she goes to the er

## 2019-01-10 NOTE — Telephone Encounter (Signed)
Called and spoke with [place]. I would like for her anemia to be controlled first by gynecology. If platelets continue to rise will need hematology referral

## 2019-01-12 ENCOUNTER — Telehealth: Payer: Self-pay | Admitting: Hematology and Oncology

## 2019-01-12 NOTE — Telephone Encounter (Signed)
Received a new hem referral from Geryl Rankins at [facility] for thrombocytosis. Tonya Paul has been cld and scheduled to see D.r Lorenso Courier on 11/3 at 10am. Pt declined to be scheduled at [facility]. Aware to arrive 20 minutes early.

## 2019-01-13 ENCOUNTER — Ambulatory Visit (INDEPENDENT_AMBULATORY_CARE_PROVIDER_SITE_OTHER)
Admission: RE | Admit: 2019-01-13 | Discharge: 2019-01-13 | Disposition: A | Discharge status: Home / Self Care | Payer: Medicaid Other | Source: Ambulatory Visit

## 2019-01-13 DIAGNOSIS — K029 Dental caries, unspecified: Secondary | ICD-10-CM

## 2019-01-13 MED ORDER — AMOXICILLIN 500 MG PO CAPS: 500.0000 mg | ORAL_CAPSULE | Freq: Two times a day (BID) | ORAL | 0 refills | Status: AC

## 2019-01-13 MED ORDER — MELOXICAM 7.5 MG PO TABS: 7.5000 mg | ORAL_TABLET | Freq: Every day | ORAL | 0 refills | Status: DC

## 2019-01-13 NOTE — ED Provider Notes (Signed)
Virtual Visit via Video Note:  Tonya Paul  initiated request for Telemedicine visit with [facility] team. I connected with Tonya Paul  on 01/13/2019 at 2:44 PM  for a synchronized telemedicine visit using a video enabled HIPPA compliant telemedicine application. I verified that I am speaking with Tonya Paul  using two identifiers. Tonya Hall-Potvin, PA-C  was physically located in a [facility] site and Tonya Paul was located at a different location.   The limitations of evaluation and management by telemedicine as well as the availability of in-person appointments were discussed. Patient was informed that she  may incur a bill ( including co-pay) for this virtual visit encounter. Tonya Paul  expressed understanding and gave verbal consent to proceed with virtual visit.     History of Present Illness:Tonya Paul  is a 43 y.o. female presents with 2-day course of left lower rear molar pain.  Patient states that she has a hole in her tooth.  Patient does not currently have a dentist, looking for office near her.  Patient states pain is worse with eating, though does not keep her up at night.  Patient has had some mild left ear discomfort this morning, though reports alleviation with 40 mg ibuprofen, Tylenol, heat/ice.  Patient denies history of dental, pharyngeal abscess, tonsillar abscess, tonsillar swelling, odynophagia.  No fever, chest pain.  Review of Systems  Constitutional: Negative for fever and malaise/fatigue.  HENT: Positive for ear pain. Negative for ear discharge, sinus pain and sore throat.        Positive for dental pain  Respiratory: Negative for cough and shortness of breath.   Cardiovascular: Negative for chest pain and palpitations.  Gastrointestinal: Negative for abdominal pain, diarrhea and vomiting.  Musculoskeletal: Negative for joint pain and myalgias.    Past Medical History:  Diagnosis Date  . Diabetes mellitus without  complication ([place])   . Hemorrhoids 11/18/2015  . Hypertension   . Left breast mass 09/01/2015  . 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 Palpitations 02/03/2016   PVCs seen on monitor  . Vertigo     Allergies  Allergen Reactions  . Ace Inhibitors Shortness Of Breath    Reported by the patient after a trial of lisinopril prescribes by PCP  . Aspirin Anaphylaxis  . Rocephin [Ceftriaxone] Itching  . Ciprofloxacin Itching  . Prednisone Palpitations        Observations/Objective: 43 year-old female Sitting in no acute distress.  Patient is able to speak in full sentences without coughing, sneezing, wheezing.  No facial edema or ear asymmetry/swelling observed.  Assessment and Plan: 1.  Dental pain We will treat for likely underlying dental infection second to dental caries with amoxicillin as outlined below.  Will supplement pain regimen with Mobic - patient reports tolerating ibuprofen, NSAIDs well in the past -will avoid OTC NSAID use while on Mobic.  Return precautions discussed, patient verbalized understanding and is agreeable to plan.  Follow Up Instructions: Patient to follow-up with dentist in 1 to 2 weeks.  Will seek evaluation in person sooner than that should symptoms persist, worsen as outlined in patient instructions.   I discussed the assessment and treatment plan with the patient. The patient was provided an opportunity to ask questions and all were answered. The patient agreed with the plan and demonstrated an understanding of the instructions.   The patient was advised to call back or seek an in-person evaluation if the symptoms worsen or if  the condition fails to improve as anticipated.  I provided 15 minutes of non-face-to-face time during this encounter.    [place], PA-C  01/13/2019 2:44 PM        Paul, [place], PA-C 01/13/19 1444

## 2019-01-13 NOTE — Discharge Instructions (Addendum)
Take antibiotic as directed with food. Take meloxicam before bedtime for dental pain. Important to follow-up with dentist, see attached dental resources. Return for worsening pain, jaw or face swelling, fever, ear pain.

## 2019-01-17 ENCOUNTER — Telehealth: Payer: Self-pay

## 2019-01-17 NOTE — Telephone Encounter (Signed)
Spoke to pt, went over monitor instructions. Verified address. 14 day ZIO ordered.  

## 2019-01-19 ENCOUNTER — Encounter (HOSPITAL_COMMUNITY): Payer: Self-pay | Admitting: *Deleted

## 2019-01-19 ENCOUNTER — Emergency Department (HOSPITAL_COMMUNITY)
Admission: EM | Admit: 2019-01-19 | Discharge: 2019-01-19 | Disposition: A | Discharge status: Home / Self Care | Payer: Medicaid Other | Attending: Emergency Medicine | Admitting: Emergency Medicine

## 2019-01-19 ENCOUNTER — Other Ambulatory Visit: Payer: Self-pay

## 2019-01-19 ENCOUNTER — Ambulatory Visit (INDEPENDENT_AMBULATORY_CARE_PROVIDER_SITE_OTHER)
Admission: RE | Admit: 2019-01-19 | Discharge: 2019-01-19 | Disposition: A | Discharge status: Home / Self Care | Payer: Self-pay | Source: Ambulatory Visit | Attending: Cardiovascular Disease | Admitting: Cardiovascular Disease

## 2019-01-19 ENCOUNTER — Other Ambulatory Visit (HOSPITAL_COMMUNITY): Payer: Self-pay

## 2019-01-19 DIAGNOSIS — Z8249 Family history of ischemic heart disease and other diseases of the circulatory system: Secondary | ICD-10-CM

## 2019-01-19 DIAGNOSIS — R002 Palpitations: Secondary | ICD-10-CM | POA: Insufficient documentation

## 2019-01-19 DIAGNOSIS — R42 Dizziness and giddiness: Secondary | ICD-10-CM | POA: Insufficient documentation

## 2019-01-19 DIAGNOSIS — Z5321 Procedure and treatment not carried out due to patient leaving prior to being seen by health care provider: Secondary | ICD-10-CM | POA: Insufficient documentation

## 2019-01-19 LAB — BASIC METABOLIC PANEL
Anion gap: 11 (ref 5–15)
BUN: 11 mg/dL (ref 6–20)
CO2: 18 mmol/L — ABNORMAL LOW (ref 22–32)
Calcium: 9.1 mg/dL (ref 8.9–10.3)
Chloride: 107 mmol/L (ref 98–111)
Creatinine, Ser: 0.75 mg/dL (ref 0.44–1.00)
GFR calc Af Amer: >60 mL/min (ref >60)
GFR calc non Af Amer: >60 mL/min (ref >60)
Glucose, Bld: 154 mg/dL — ABNORMAL HIGH (ref 70–99)
Potassium: 4.1 mmol/L (ref 3.5–5.1)
Sodium: 136 mmol/L (ref 135–145)

## 2019-01-19 LAB — I-STAT BETA HCG BLOOD, ED (MC, WL, AP ONLY): I-stat hCG, quantitative: <5 m[IU]/mL (ref <5)

## 2019-01-19 LAB — CBC
HCT: 34.5 % — ABNORMAL LOW (ref 36.0–46.0)
Hemoglobin: 10.6 g/dL — ABNORMAL LOW (ref 12.0–15.0)
MCH: 25.3 pg — ABNORMAL LOW (ref 26.0–34.0)
MCHC: 30.7 g/dL (ref 30.0–36.0)
MCV: 82.3 fL (ref 80.0–100.0)
Platelets: 455 10*3/uL — ABNORMAL HIGH (ref 150–400)
RBC: 4.19 MIL/uL (ref 3.87–5.11)
RDW: 15.3 % (ref 11.5–15.5)
WBC: 9.8 10*3/uL (ref 4.0–10.5)
nRBC: 0 % (ref 0.0–0.2)

## 2019-01-19 MED ORDER — SODIUM CHLORIDE 0.9% FLUSH: 3.0000 mL | Freq: Once | INTRAVENOUS | Status: DC

## 2019-01-19 NOTE — ED Triage Notes (Signed)
States she was at Dr Gwenlyn Found' s office and had a CT scan of her heart , was going back to her car and started felling dizzy, with heart palpitations, states she sat in her car to calm down, and became nauseated , states she vomited was given zofran 4 mg IV per ems. States she isn't as dizzy not c/o nausea

## 2019-01-19 NOTE — ED Notes (Signed)
Pt up to nurses station requesting that her iv be removed so she can leave. Pt states she does not want to wait any longer. This Rn encouraged patient to stay and advised her to return if needed. Iv removed at this time by April, RN

## 2019-01-22 ENCOUNTER — Telehealth: Payer: Self-pay | Admitting: *Deleted

## 2019-01-22 DIAGNOSIS — R931 Abnormal findings on diagnostic imaging of heart and coronary circulation: Secondary | ICD-10-CM

## 2019-01-22 DIAGNOSIS — I10 Essential (primary) hypertension: Secondary | ICD-10-CM

## 2019-01-22 MED ORDER — PROPRANOLOL HCL ER 80 MG PO CP24: ORAL_CAPSULE | ORAL | 1 refills | Status: DC

## 2019-01-22 MED ORDER — ATORVASTATIN CALCIUM 10 MG PO TABS: 10.0000 mg | ORAL_TABLET | Freq: Every day | ORAL | 3 refills | Status: DC

## 2019-01-22 MED FILL — PROPRANOLOL ER 80 MG CAP: 80 | 30 days supply | Qty: 30 | Fill #0

## 2019-01-22 MED FILL — ATORVASTATIN 10 MG TABLET: 10 | 30 days supply | Qty: 30 | Fill #0

## 2019-01-22 NOTE — Telephone Encounter (Signed)
-----   Message from Lorretta Harp, MD sent at 01/21/2019 12:24 PM EST ----- Coronary calcium score was 47.  While this is not terribly high it still puts her at slightly higher risk than if it were lower.  Given her recent fasting lipid profile on 01/05/2019 with LDL of 98 I prefer for her to be less than 70 and therefore recommending that we place her on atorvastatin 10 mg a day and recheck a lipid liver profile in 2 months

## 2019-01-23 ENCOUNTER — Inpatient Hospital Stay: Payer: Medicaid Other | Attending: Hematology and Oncology | Admitting: Hematology and Oncology

## 2019-01-23 ENCOUNTER — Telehealth: Payer: Self-pay | Admitting: *Deleted

## 2019-01-23 ENCOUNTER — Encounter (HOSPITAL_COMMUNITY): Payer: Self-pay | Admitting: Cardiovascular Disease

## 2019-01-23 ENCOUNTER — Other Ambulatory Visit: Payer: Medicaid Other

## 2019-01-23 ENCOUNTER — Other Ambulatory Visit (HOSPITAL_COMMUNITY): Payer: Self-pay

## 2019-01-23 NOTE — Telephone Encounter (Signed)
TCT patient as she did not show this morning for her appt with Dr. Lorenso Courier. Spoke with patient. She sates she over slept. She is agreeable to reschedule her MD appt and labs. Scheduling message sent   Dr. Lorenso Courier aware.

## 2019-01-24 ENCOUNTER — Telehealth: Payer: Self-pay | Admitting: Hematology and Oncology

## 2019-01-24 NOTE — Telephone Encounter (Signed)
Pt cld to reschedule her appt she missed with Dr. Lorenso Courier on 11/3. She has been rescheduled to 11/11 at 9am. Pt has been made aware to arrive 15 minutes early.

## 2019-01-29 ENCOUNTER — Ambulatory Visit: Payer: Medicaid Other | Admitting: Family Medicine

## 2019-01-29 ENCOUNTER — Encounter: Payer: Self-pay | Admitting: Family Medicine

## 2019-01-31 ENCOUNTER — Telehealth: Payer: Self-pay | Admitting: *Deleted

## 2019-01-31 ENCOUNTER — Inpatient Hospital Stay: Payer: Medicaid Other | Admitting: Hematology and Oncology

## 2019-01-31 ENCOUNTER — Other Ambulatory Visit: Payer: Medicaid Other

## 2019-01-31 NOTE — Progress Notes (Deleted)
[facility] [phone]   Fax:([phone] [place] NOTE  Patient Care Team: Gildardo Pounds, NP as PCP - General (Nurse Practitioner) Lorretta Harp, MD as PCP - Cardiology (Cardiology)  Hematological/Oncological History # Thrombocytosis 1) 10/06/2015: WBC 7.2, Hgb 12.3, MCV 84, Plt 431. First elevated Plt on record 2)  01/05/2019: WBC 7.7, Hgb 10.8, Plt 513, MCV 76. Iron '325mg'$  PO daily started 01/10/19. 3)01/19/2019: WBC 9.8, Hgb 10.6, Plt 455, MCV 82.3.  4) 01/31/2019: establish care with Dr. Lorenso Courier   CHIEF COMPLAINTS/PURPOSE OF CONSULTATION:  Thromboctyosis  HISTORY OF PRESENTING ILLNESS:  Tonya Paul 43 y.o. female with medical history significant for DM type II, hemorrhoids, and Long QT syndrome who presents for evaluation of thrombocytosis.   On review of the records Tonya Paul has had longstanding thrombocytosis, dating back to at least July 2017. Since that time she has had a progressive drop in Hgb and development of microcytosis. She was prescribed iron tablets on 01/10/2019, though no baseline iron labs were collected. She has labs drawn on 01/19/19 which showed resolving microcytosis and decrease in Plt count, though Hgb was stable.  On exam today ***  MEDICAL HISTORY:  Past Medical History:  Diagnosis Date  . Diabetes mellitus without complication ([place])   . Hemorrhoids 11/18/2015  . Hypertension   . Left breast mass 09/01/2015  . 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 Palpitations 02/03/2016   PVCs seen on monitor  . Vertigo     SURGICAL HISTORY: Past Surgical History:  Procedure Laterality Date  . BREAST BIOPSY Left 09/03/2015   [place] Core bx, benign  . ESOPHAGOGASTRODUODENOSCOPY (EGD) WITH PROPOFOL N/A 06/21/2017   Procedure: ESOPHAGOGASTRODUODENOSCOPY (EGD) WITH PROPOFOL;  Surgeon: Lin Landsman, MD;  Location: [facility];  Service: Gastroenterology;  Laterality: N/A;    SOCIAL HISTORY:  Social History   Socioeconomic History  . Marital status: Single    Spouse name: Not on file  . Number of children: Not on file  . Years of education: Not on file  . Highest education level: Not on file  Occupational History  . Not on file  Social Needs  . Financial resource strain: Not on file  . Food insecurity    Worry: Not on file    Inability: Not on file  . Transportation needs    Medical: Not on file    Non-medical: Not on file  Tobacco Use  . Smoking status: Never Smoker  . Smokeless tobacco: Never Used  Substance and Sexual Activity  . Alcohol use: No  . Drug use: No  . Sexual activity: Yes    Birth control/protection: None  Lifestyle  . Physical activity    Days per week: Not on file    Minutes per session: Not on file  . Stress: Not on file  Relationships  . Social Herbalist on phone: Not on file    Gets together: Not on file    Attends religious service: Not on file    Active member of club or organization: Not on file    Attends meetings of clubs or organizations: Not on file    Relationship status: Not on file  . Intimate partner violence    Fear of current or ex partner: Not on file    Emotionally abused: Not on file    Physically abused: Not on file    Forced sexual activity: Not on file  Other Topics Concern  .  Not on file  Social History Narrative  . Not on file    FAMILY HISTORY: Family History  Problem Relation Age of Onset  . Hypertension Mother   . Lung cancer Mother   . Throat cancer Father   . Diabetes Sister   . Lupus Maternal Grandmother   . Stroke Maternal Grandfather   . Sudden Cardiac Death Daughter   . Breast cancer Paternal Aunt     ALLERGIES:  is allergic to ace inhibitors; aspirin; rocephin [ceftriaxone]; ciprofloxacin; and prednisone.  MEDICATIONS:  Current Outpatient Medications  Medication Sig Dispense Refill  . atorvastatin (LIPITOR) 10 MG tablet Take 1 tablet (10 mg total) by mouth daily. 90 tablet 3   . Blood Glucose Monitoring Suppl (TRUE METRIX METER) DEVI 1 each by Does not apply route 3 (three) times daily before meals. 1 Device 0  . ferrous sulfate 324 (65 Fe) MG TBEC Take 1 tablet (325 mg total) by mouth 2 (two) times daily. 180 tablet 1  . glucose blood (RELION GLUCOSE TEST STRIPS) test strip Use as instructed 100 each 12  . Insulin Syringe-Needle U-100 (B-D INS SYRINGE 0.5CC/31GX5/16) 31G X 5/16" 0.5 ML MISC USE AS DIRECTED DAILY FOR INSULIN ADMINISTRATION 100 each 1  . LANTUS SOLOSTAR 100 UNIT/ML Solostar Pen INJECT 25 UNITS INTO THE SKIN AT BEDTIME. 15 mL 5  . meloxicam (MOBIC) 7.5 MG tablet Take 1 tablet (7.5 mg total) by mouth daily. 14 tablet 0  . metFORMIN (GLUCOPHAGE-XR) 500 MG 24 hr tablet TAKE 2 TABLETS (1,000 MG TOTAL) BY MOUTH 2 (TWO) TIMES DAILY. 120 tablet 2  . propranolol ER (INDERAL LA) 80 MG 24 hr capsule TAKE 1 CAPSULE BY MOUTH ONCE A DAY. NEEDS OFFICE VISIT FOR MORE REFILLS. 90 capsule 1  . triamcinolone cream (KENALOG) 0.1 % Apply 1 application topically 2 (two) times daily. 30 g 0  . TRUEPLUS PEN NEEDLES 31G X 5 MM MISC USE AS INSTRUCTED 100 each 1   No current facility-administered medications for this visit.     REVIEW OF SYSTEMS:   Constitutional: ( - ) fevers, ( - )  chills , ( - ) night sweats Eyes: ( - ) blurriness of vision, ( - ) double vision, ( - ) watery eyes Ears, nose, mouth, throat, and face: ( - ) mucositis, ( - ) sore throat Respiratory: ( - ) cough, ( - ) dyspnea, ( - ) wheezes Cardiovascular: ( - ) palpitation, ( - ) chest discomfort, ( - ) lower extremity swelling Gastrointestinal:  ( - ) nausea, ( - ) heartburn, ( - ) change in bowel habits Skin: ( - ) abnormal skin rashes Lymphatics: ( - ) new lymphadenopathy, ( - ) easy bruising Neurological: ( - ) numbness, ( - ) tingling, ( - ) new weaknesses Behavioral/Psych: ( - ) mood change, ( - ) new changes  All other systems were reviewed with the patient and are negative.  PHYSICAL  EXAMINATION: ECOG PERFORMANCE STATUS: {CHL ONC ECOG [phone]}  There were no vitals filed for this visit. There were no vitals filed for this visit.  GENERAL: alert, no distress and comfortable SKIN: skin color, texture, turgor are normal, no rashes or significant lesions EYES: conjunctiva are pink and non-injected, sclera clear OROPHARYNX: no exudate, no erythema; lips, buccal mucosa, and tongue normal  NECK: supple, non-tender LYMPH:  no palpable lymphadenopathy in the cervical, axillary or inguinal LUNGS: clear to auscultation and percussion with normal breathing effort HEART: regular rate & rhythm and no  murmurs and no lower extremity edema ABDOMEN: soft, non-tender, non-distended, normal bowel sounds Musculoskeletal: no cyanosis of digits and no clubbing  PSYCH: alert & oriented x 3, fluent speech NEURO: no focal motor/sensory deficits  LABORATORY DATA:  I have reviewed the data as listed Lab Results  Component Value Date   WBC 9.8 01/19/2019   HGB 10.6 (L) 01/19/2019   HCT 34.5 (L) 01/19/2019   MCV 82.3 01/19/2019   PLT 455 (H) 01/19/2019   NEUTROABS 3.9 01/05/2019    PATHOLOGY: ***  BLOOD FILM: *** I personally reviewed the patient's peripheral blood smear today.  There was no peripheral blast.  The white blood cells and red blood cells were of normal morphology. There was no schistocytosis or anisocytosis.  The platelets are of normal size and I have verified that there were no platelet clumping.  RADIOGRAPHIC STUDIES: I have personally reviewed the radiological images as listed and agreed with the findings in the report. Ct Cardiac Scoring  Addendum Date: 01/19/2019   ADDENDUM REPORT: 01/19/2019 11:19 CLINICAL DATA:  Risk stratification EXAM: Coronary Calcium Score TECHNIQUE: The patient was scanned on a [employer]. Axial non-contrast 3 mm slices were carried out through the heart. The data set was analyzed on a dedicated work station and scored  using the [place]. FINDINGS: Non-cardiac: See separate report from [facility]. Ascending Aorta: Normal size, no calcifications. Pericardium: Normal. Coronary arteries: Normal origin. IMPRESSION: Coronary calcium score of 47. This was 23 percentile for age and sex matched control. Electronically Signed   By: Ena Dawley   On: 01/19/2019 11:19   Result Date: 01/19/2019 EXAM: OVER-READ INTERPRETATION  CT CHEST The following report is an over-read performed by radiologist Dr. Rolm Baptise of [facility], [place] on 01/19/2019. This over-read does not include interpretation of cardiac or coronary anatomy or pathology. The coronary calcium score interpretation by the cardiologist is attached. COMPARISON:  None. FINDINGS: Vascular: Heart is normal size.  Aorta is normal caliber. Mediastinum/Nodes: No adenopathy in the lower mediastinum or hila. Lungs/Pleura: Visualized lungs clear.  No effusions. Upper Abdomen: Imaging into the upper abdomen shows no acute findings. Musculoskeletal: Chest wall soft tissues are unremarkable. No acute bony abnormality. IMPRESSION: No acute or significant extracardiac abnormality. Electronically Signed: By: Rolm Baptise M.D. On: 01/19/2019 11:12    ASSESSMENT & PLAN Genea Rheaume 43 y.o. female with medical history significant for DM type II, hemorrhoids, and Long QT syndrome who presents for evaluation of thrombocytosis.    #Thromboctyosis #Microcytic/Normocytic Anemia --the most likely etiology of her thrombocytosis and anemia is iron deficiency. Will check an iron panel today. If iron deficiency is confirmed with start iron '325mg'$  PO daily with source of vitamin C.  --assess inflammatory status with ESR, CRP, and ferritin --will order additional nutritional studies to include Vitamin B12, Folate, MMA, and homocysteine --review of peripheral blood film shows *** --low suspicion for MPN, however if no clear etiology is found on the above labs can  consider that evaluation. --RTC in 4 weeks to assess response to iron therapy  No orders of the defined types were placed in this encounter.   All questions were answered. The patient knows to call the clinic with any problems, questions or concerns.  A total of more than {CHL ONC TIME VISIT - HTDSK:[id number]} were spent face-to-face with the patient during this encounter and over half of that time was spent on counseling and coordination of care as outlined above.   Ledell Peoples, MD Department  of Hematology/Oncology [facility] Phone: [phone] Pager: [phone] Email: [email or web address]   01/31/2019 8:18 AM

## 2019-01-31 NOTE — Telephone Encounter (Signed)
TCT patient regarding her appt today. Pt did not show today. This is the 2nd appt she has not shown up for, No answer to call but was able to leave vm message for pt to call back to re-schedule.

## 2019-02-04 ENCOUNTER — Encounter: Payer: Self-pay | Admitting: Nurse Practitioner

## 2019-02-05 ENCOUNTER — Telehealth (HOSPITAL_COMMUNITY): Payer: Self-pay

## 2019-02-05 NOTE — Telephone Encounter (Signed)
New message   Just an FYI. We have made several attempts to contact this patient including sending a letter to schedule or reschedule their echocardiogram. We will be removing the patient from the echo WQ.  11.16.20 @ 11:40am both # are the same- phone disconnect - Tonya Paul   11.9.20 @ 4:08pm lm on home vm - Tonya Paul   11.3.20 mail reminder letter Tonya Paul  11.3.20 no show

## 2019-02-05 NOTE — Telephone Encounter (Signed)
FYI

## 2019-02-06 ENCOUNTER — Encounter: Payer: Self-pay | Admitting: Nurse Practitioner

## 2019-02-08 ENCOUNTER — Ambulatory Visit (INDEPENDENT_AMBULATORY_CARE_PROVIDER_SITE_OTHER): Payer: Self-pay

## 2019-02-08 DIAGNOSIS — R002 Palpitations: Secondary | ICD-10-CM

## 2019-02-09 ENCOUNTER — Encounter: Payer: Self-pay | Admitting: Nurse Practitioner

## 2019-02-12 ENCOUNTER — Encounter: Payer: Self-pay | Admitting: Nurse Practitioner

## 2019-02-12 NOTE — Telephone Encounter (Signed)
Please mail patient dental resources.

## 2019-02-23 MED FILL — ATORVASTATIN 10 MG TABLET: 10 | 30 days supply | Qty: 30 | Fill #1

## 2019-02-23 MED FILL — $LANTUS SOLOSTAR 100 UNITS/: 100 | 36 days supply | Qty: 9 | Fill #2

## 2019-02-23 MED FILL — PROPRANOLOL ER 80 MG CAP: 80 | 30 days supply | Qty: 30 | Fill #1

## 2019-03-03 ENCOUNTER — Other Ambulatory Visit: Payer: Self-pay

## 2019-03-03 ENCOUNTER — Emergency Department
Admission: EM | Admit: 2019-03-03 | Discharge: 2019-03-03 | Disposition: A | Discharge status: Home / Self Care | Payer: Medicaid Other | Attending: Emergency Medicine | Admitting: Emergency Medicine

## 2019-03-03 ENCOUNTER — Encounter: Payer: Self-pay | Admitting: Emergency Medicine

## 2019-03-03 DIAGNOSIS — M7918 Myalgia, other site: Secondary | ICD-10-CM | POA: Insufficient documentation

## 2019-03-03 DIAGNOSIS — R519 Headache, unspecified: Secondary | ICD-10-CM | POA: Insufficient documentation

## 2019-03-03 DIAGNOSIS — Z5321 Procedure and treatment not carried out due to patient leaving prior to being seen by health care provider: Secondary | ICD-10-CM | POA: Insufficient documentation

## 2019-03-03 DIAGNOSIS — R509 Fever, unspecified: Secondary | ICD-10-CM | POA: Insufficient documentation

## 2019-03-03 NOTE — ED Triage Notes (Signed)
EMS pt to triage with co flu like sx x 3 days with a headache. Chills and muscle aches. Pt thinks she was around someone who is COVID positive at her job.

## 2019-03-03 NOTE — ED Notes (Signed)
This RN spoke with Dr Owens Shark about something for her fever as pt had taken tylenol 1000 mg at 11 pm. No new orders received.

## 2019-03-05 ENCOUNTER — Other Ambulatory Visit: Payer: Self-pay

## 2019-03-05 ENCOUNTER — Emergency Department
Admission: EM | Admit: 2019-03-05 | Discharge: 2019-03-05 | Disposition: A | Discharge status: Home / Self Care | Payer: Medicaid Other | Attending: Emergency Medicine | Admitting: Emergency Medicine

## 2019-03-05 ENCOUNTER — Emergency Department: Payer: Medicaid Other

## 2019-03-05 ENCOUNTER — Ambulatory Visit (INDEPENDENT_AMBULATORY_CARE_PROVIDER_SITE_OTHER)
Admission: RE | Admit: 2019-03-05 | Discharge: 2019-03-05 | Disposition: A | Discharge status: Other Acute Inpatient Hospital | Payer: Medicaid Other | Source: Ambulatory Visit

## 2019-03-05 ENCOUNTER — Encounter: Payer: Self-pay | Admitting: Emergency Medicine

## 2019-03-05 DIAGNOSIS — R509 Fever, unspecified: Secondary | ICD-10-CM

## 2019-03-05 DIAGNOSIS — M791 Myalgia, unspecified site: Secondary | ICD-10-CM | POA: Insufficient documentation

## 2019-03-05 DIAGNOSIS — I10 Essential (primary) hypertension: Secondary | ICD-10-CM | POA: Insufficient documentation

## 2019-03-05 DIAGNOSIS — Z20822 Contact with and (suspected) exposure to covid-19: Secondary | ICD-10-CM

## 2019-03-05 DIAGNOSIS — Z79899 Other long term (current) drug therapy: Secondary | ICD-10-CM | POA: Insufficient documentation

## 2019-03-05 DIAGNOSIS — Z794 Long term (current) use of insulin: Secondary | ICD-10-CM | POA: Insufficient documentation

## 2019-03-05 DIAGNOSIS — E119 Type 2 diabetes mellitus without complications: Secondary | ICD-10-CM | POA: Insufficient documentation

## 2019-03-05 DIAGNOSIS — R110 Nausea: Secondary | ICD-10-CM | POA: Insufficient documentation

## 2019-03-05 DIAGNOSIS — R739 Hyperglycemia, unspecified: Secondary | ICD-10-CM

## 2019-03-05 DIAGNOSIS — Z20828 Contact with and (suspected) exposure to other viral communicable diseases: Secondary | ICD-10-CM | POA: Insufficient documentation

## 2019-03-05 DIAGNOSIS — N390 Urinary tract infection, site not specified: Secondary | ICD-10-CM | POA: Insufficient documentation

## 2019-03-05 LAB — BASIC METABOLIC PANEL
Anion gap: 10 (ref 5–15)
BUN: 8 mg/dL (ref 6–20)
CO2: 19 mmol/L — ABNORMAL LOW (ref 22–32)
Calcium: 8.7 mg/dL — ABNORMAL LOW (ref 8.9–10.3)
Chloride: 105 mmol/L (ref 98–111)
Creatinine, Ser: 0.83 mg/dL (ref 0.44–1.00)
GFR calc Af Amer: >60 mL/min (ref >60)
GFR calc non Af Amer: >60 mL/min (ref >60)
Glucose, Bld: 257 mg/dL — ABNORMAL HIGH (ref 70–99)
Potassium: 3.4 mmol/L — ABNORMAL LOW (ref 3.5–5.1)
Sodium: 134 mmol/L — ABNORMAL LOW (ref 135–145)

## 2019-03-05 LAB — CBC
HCT: 34.3 % — ABNORMAL LOW (ref 36.0–46.0)
Hemoglobin: 11.5 g/dL — ABNORMAL LOW (ref 12.0–15.0)
MCH: 26.5 pg (ref 26.0–34.0)
MCHC: 33.5 g/dL (ref 30.0–36.0)
MCV: 79 fL — ABNORMAL LOW (ref 80.0–100.0)
Platelets: 351 10*3/uL (ref 150–400)
RBC: 4.34 MIL/uL (ref 3.87–5.11)
RDW: 17.9 % — ABNORMAL HIGH (ref 11.5–15.5)
WBC: 7 10*3/uL (ref 4.0–10.5)
nRBC: 0 % (ref 0.0–0.2)

## 2019-03-05 LAB — GLUCOSE, CAPILLARY: Glucose-Capillary: 250 mg/dL — ABNORMAL HIGH (ref 70–99)

## 2019-03-05 LAB — POCT PREGNANCY, URINE: Preg Test, Ur: NEGATIVE

## 2019-03-05 LAB — URINALYSIS, COMPLETE (UACMP) WITH MICROSCOPIC
Bilirubin Urine: NEGATIVE
Glucose, UA: NEGATIVE mg/dL
Ketones, ur: NEGATIVE mg/dL
Nitrite: POSITIVE — AB
Protein, ur: 30 mg/dL — AB
RBC / HPF: >50 RBC/hpf — ABNORMAL HIGH (ref 0–5)
Specific Gravity, Urine: 1.01 (ref 1.005–1.030)
WBC, UA: >50 WBC/hpf — ABNORMAL HIGH (ref 0–5)
pH: 7 (ref 5.0–8.0)

## 2019-03-05 MED ORDER — CEPHALEXIN 500 MG PO CAPS
500.0000 mg | ORAL_CAPSULE | Freq: Once | ORAL | Status: AC
  Administered 2019-03-05: 15:00:00 500 mg via ORAL
  Filled 2019-03-05: qty 1

## 2019-03-05 MED ORDER — CEPHALEXIN 500 MG PO CAPS: 500.0000 mg | ORAL_CAPSULE | Freq: Two times a day (BID) | ORAL | 0 refills | Status: AC

## 2019-03-05 MED ORDER — PROCHLORPERAZINE MALEATE 5 MG PO TABS
5.0000 mg | ORAL_TABLET | Freq: Once | ORAL | Status: AC
  Administered 2019-03-05: 15:00:00 5 mg via ORAL
  Filled 2019-03-05 (×2): qty 1

## 2019-03-05 NOTE — ED Provider Notes (Signed)
Virtual Visit via Video Note:  Tonya Paul  initiated request for Telemedicine visit with [facility] team. I connected with Tonya Paul  on 03/05/2019 at 10:17 AM  for a synchronized telemedicine visit using a video enabled HIPPA compliant telemedicine application. I verified that I am speaking with Tonya Paul  using two identifiers. Tonya Balloon, NP  was physically located in a [facility] Urgent care site and Tonya Paul was located at a different location.   The limitations of evaluation and management by telemedicine as well as the availability of in-person appointments were discussed. Patient was informed that she  may incur a bill ( including co-pay) for this virtual visit encounter. Tonya Paul  expressed understanding and gave verbal consent to proceed with virtual visit.     History of Present Illness:Tonya Paul  is a 43 y.o. female presents for evaluation of 5 day history of fever, body aches, headache, and nausea. Tmax 104.7 on 03/03/2019; Went to the ED on 03/03/2019 but LWBS due to wait time.  Denies cough, shortness of breath, congestion, sore throat, vomiting, or diarrhea.  In the last 24 hours, temp 102.6.  Blood glucose this morning 287; patient has DM type II.   Allergies  Allergen Reactions  . Ace Inhibitors Shortness Of Breath    Reported by the patient after a trial of lisinopril prescribes by PCP  . Aspirin Anaphylaxis  . Rocephin [Ceftriaxone] Itching  . Ciprofloxacin Itching  . Prednisone Palpitations     Past Medical History:  Diagnosis Date  . Diabetes mellitus without complication ([place])   . Hemorrhoids 11/18/2015  . Hypertension   . Left breast mass 09/01/2015  . 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 Palpitations 02/03/2016   PVCs seen on monitor  . Vertigo      Social History   Tobacco Use  . Smoking status: Never Smoker  . Smokeless tobacco: Never Used  Substance Use Topics  . Alcohol use: No  . Drug use: No         Observations/Objective: Physical Exam  VITALS: Temp 102.6-104.7 per patient. GENERAL: Alert, no acute distress.  Ill-appearing. HEENT: Atraumatic. NECK: Normal movements of the head and neck. CARDIOPULMONARY: No increased WOB. Speaking in clear sentences. I:E ratio WNL.  MS: Moves all visible extremities without noticeable abnormality. PSYCH: Pleasant and cooperative, well-groomed. Speech normal rate and rhythm. Affect is appropriate. Insight and judgement are appropriate. Attention is focused, linear, and appropriate.  NEURO: CN grossly intact. Oriented as arrived to appointment on time with no prompting. Moves both UE equally.  SKIN: No obvious lesions or wounds.  Assessment and Plan:    ICD-10-CM   1. Fever, unspecified fever cause  R50.9   2. Hyperglycemia  R73.9        Follow Up Instructions: Based on her high fever and hyperglycemia, instructed patient to go to the emergency department this morning for evaluation.  Patient agrees to this plan of care.    I discussed the assessment and treatment plan with the patient. The patient was provided an opportunity to ask questions and all were answered. The patient agreed with the plan and demonstrated an understanding of the instructions.   The patient was advised to call back or seek an in-person evaluation if the symptoms worsen or if the condition fails to improve as anticipated.      Tonya Balloon, NP  03/05/2019 10:17 AM         Tonya Paul,  Tonya Phenix, NP 03/05/19 1017

## 2019-03-05 NOTE — ED Notes (Signed)
See triage note  Presents with some body aches and fever  States fever at home was 104  On arrival low grade  Also states she is not able to control her glucose  Also has had bad h/a

## 2019-03-05 NOTE — ED Triage Notes (Signed)
Pt here with c/o continued fever, has been off and on for about 5 days now, only other complaint is a bad headache and chills. NAD.

## 2019-03-05 NOTE — ED Notes (Signed)
Pharmacy notified to send Compazine dose.

## 2019-03-05 NOTE — Discharge Instructions (Addendum)
Please go to the emergency department for evaluation of your fever and elevated blood sugar.

## 2019-03-05 NOTE — ED Notes (Signed)
Pt states her blood glucose this am was 287, takes insulin at night, urinating a lot recently and very thirsty in triage.

## 2019-03-05 NOTE — ED Provider Notes (Signed)
[facility] Emergency Department Provider Note   ____________________________________________   First MD Initiated Contact with Patient 03/05/19 1233     (approximate)  I have reviewed the triage vital signs and the nursing notes.   HISTORY  Chief Complaint Hyperglycemia, Fever, Chills, and Headache    HPI Tonya Paul is a 43 y.o. female with possible history of diabetes and long QT syndrome who presents to the ED complaining of fever, body aches, and nausea.  Patient reports that she has had 4 to 5 days of persistent fevers, as high as 104 at home.  She has been alternating Tylenol and ibuprofen with improvement in her temperatures.  She also complains of diffuse body aches, chills, and nausea, but she has not had any vomiting or diarrhea.  She also denies any cough, chest pain, or shortness of breath.  She has not noticed any dysuria, hematuria, or flank pain.  She has noticed that her blood glucose levels have been running high despite being compliant with her Lantus and Metformin.        Past Medical History:  Diagnosis Date  . Diabetes mellitus without complication ([place])   . Hemorrhoids 11/18/2015  . Hypertension   . Left breast mass 09/01/2015  . 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 Palpitations 02/03/2016   PVCs seen on monitor  . Vertigo     Patient Active Problem List   Diagnosis Date Noted  . Abdominal pain, chronic, epigastric   . GERD (gastroesophageal reflux disease) 06/20/2017  . Long Q-T syndrome 01/14/2017  . Palpitations 02/03/2016  . Uterine fibroid 12/11/2015  . Family history of heart disease 12/03/2015  . Hemorrhoids 11/18/2015  . Left breast mass 09/01/2015  . Obesity 09/01/2015  . Type 2 diabetes mellitus, uncontrolled ([place]) 04/16/2015    Past Surgical History:  Procedure Laterality Date  . BREAST BIOPSY Left 09/03/2015   [place] Core bx, benign  . ESOPHAGOGASTRODUODENOSCOPY (EGD) WITH PROPOFOL N/A  06/21/2017   Procedure: ESOPHAGOGASTRODUODENOSCOPY (EGD) WITH PROPOFOL;  Surgeon: Lin Landsman, MD;  Location: [place];  Service: Gastroenterology;  Laterality: N/A;    Prior to Admission medications   Medication Sig Start Date End Date Taking? Authorizing Provider  atorvastatin (LIPITOR) 10 MG tablet Take 1 tablet (10 mg total) by mouth daily. 01/22/19 04/22/19  Lorretta Harp, MD  Blood Glucose Monitoring Suppl (TRUE METRIX METER) DEVI 1 each by Does not apply route 3 (three) times daily before meals. 04/16/15   Charlott Rakes, MD  cephALEXin (KEFLEX) 500 MG capsule Take 1 capsule (500 mg total) by mouth 2 (two) times daily for 7 days. 03/05/19 03/12/19  Blake Divine, MD  ferrous sulfate 324 (65 Fe) MG TBEC Take 1 tablet (325 mg total) by mouth 2 (two) times daily. 01/10/19 04/10/19  Gildardo Pounds, NP  glucose blood (RELION GLUCOSE TEST STRIPS) test strip Use as instructed 07/30/16   Fredia Beets R, FNP  Insulin Syringe-Needle U-100 (B-D INS SYRINGE 0.5CC/31GX5/16) 31G X 5/16" 0.5 ML MISC USE AS DIRECTED DAILY FOR INSULIN ADMINISTRATION 11/29/16   Alfonse Spruce, FNP  LANTUS SOLOSTAR 100 UNIT/ML Solostar Pen INJECT 25 UNITS INTO THE SKIN AT BEDTIME. 08/21/18   Gildardo Pounds, NP  meloxicam (MOBIC) 7.5 MG tablet Take 1 tablet (7.5 mg total) by mouth daily. 01/13/19   Hall-Potvin, [place], PA-C  metFORMIN (GLUCOPHAGE-XR) 500 MG 24 hr tablet TAKE 2 TABLETS (1,000 MG TOTAL) BY MOUTH 2 (TWO) TIMES DAILY. 01/03/19  Charlott Rakes, MD  propranolol ER (INDERAL LA) 80 MG 24 hr capsule TAKE 1 CAPSULE BY MOUTH ONCE A DAY. NEEDS OFFICE VISIT FOR MORE REFILLS. 01/22/19   Lorretta Harp, MD  triamcinolone cream (KENALOG) 0.1 % Apply 1 application topically 2 (two) times daily. 07/07/18   Norval Gable, MD  TRUEPLUS PEN NEEDLES 31G X 5 MM MISC USE AS INSTRUCTED 08/18/18   Gildardo Pounds, NP    Allergies Ace inhibitors, Aspirin, Rocephin [ceftriaxone], Ciprofloxacin, and  Prednisone  Family History  Problem Relation Age of Onset  . Hypertension Mother   . Lung cancer Mother   . Throat cancer Father   . Diabetes Sister   . Lupus Maternal Grandmother   . Stroke Maternal Grandfather   . Sudden Cardiac Death Daughter   . Breast cancer Paternal Aunt     Social History Social History   Tobacco Use  . Smoking status: Never Smoker  . Smokeless tobacco: Never Used  Substance Use Topics  . Alcohol use: No  . Drug use: No    Review of Systems  Constitutional: Positive for fever/chills Eyes: No visual changes. ENT: No sore throat. Cardiovascular: Denies chest pain. Respiratory: Denies shortness of breath. Gastrointestinal: No abdominal pain.  Positive for nausea, no vomiting.  No diarrhea.  No constipation. Genitourinary: Negative for dysuria. Musculoskeletal: Negative for back pain.  Positive for myalgias. Skin: Negative for rash. Neurological: Negative for headaches, focal weakness or numbness.  ____________________________________________   PHYSICAL EXAM:  VITAL SIGNS: ED Triage Vitals  Enc Vitals Group     BP 03/05/19 1155 135/83     Pulse Rate 03/05/19 1155 85     Resp 03/05/19 1155 18     Temp 03/05/19 1155 99.7 F (37.6 C)     Temp Source 03/05/19 1155 Oral     SpO2 03/05/19 1155 99 %     Weight 03/05/19 1156 185 lb (83.9 kg)     Height 03/05/19 1156 5\' 10"  (1.778 m)     Head Circumference --      Peak Flow --      Pain Score 03/05/19 1156 9     Pain Loc --      Pain Edu? --      Excl. in [place]? --     Constitutional: Alert and oriented. Eyes: Conjunctivae are normal. Head: Atraumatic. Nose: No congestion/rhinnorhea. Mouth/Throat: Mucous membranes are moist. Neck: Normal ROM Cardiovascular: Normal rate, regular rhythm. Grossly normal heart sounds. Respiratory: Normal respiratory effort.  No retractions. Lungs CTAB. Gastrointestinal: Soft and nontender. No distention. Genitourinary: deferred Musculoskeletal: No lower  extremity tenderness nor edema. Neurologic:  Normal speech and language. No gross focal neurologic deficits are appreciated. Skin:  Skin is warm, dry and intact. No rash noted. Psychiatric: Mood and affect are normal. Speech and behavior are normal.  ____________________________________________   LABS (all labs ordered are listed, but only abnormal results are displayed)  Labs Reviewed  BASIC METABOLIC PANEL - Abnormal; Notable for the following components:      Result Value   Sodium 134 (*)    Potassium 3.4 (*)    CO2 19 (*)    Glucose, Bld 257 (*)    Calcium 8.7 (*)    All other components within normal limits  CBC - Abnormal; Notable for the following components:   Hemoglobin 11.5 (*)    HCT 34.3 (*)    MCV 79.0 (*)    RDW 17.9 (*)    All other components within normal  limits  URINALYSIS, COMPLETE (UACMP) WITH MICROSCOPIC - Abnormal; Notable for the following components:   Color, Urine YELLOW (*)    APPearance CLOUDY (*)    Hgb urine dipstick LARGE (*)    Protein, ur 30 (*)    Nitrite POSITIVE (*)    Leukocytes,Ua SMALL (*)    RBC / HPF >50 (*)    WBC, UA >50 (*)    Bacteria, UA FEW (*)    All other components within normal limits  GLUCOSE, CAPILLARY - Abnormal; Notable for the following components:   Glucose-Capillary 250 (*)    All other components within normal limits  URINE CULTURE  NOVEL CORONAVIRUS, NAA (HOSP ORDER, SEND-OUT TO REF LAB; TAT 18-24 HRS)  CBG MONITORING, ED  CBG MONITORING, ED  POC URINE PREG, ED  POCT PREGNANCY, URINE     PROCEDURES  Procedure(s) performed (including Critical Care):  Procedures   ____________________________________________   INITIAL IMPRESSION / ASSESSMENT AND PLAN / ED COURSE       43 year old female with history of type 2 diabetes and long QT syndrome presents to the ED complaining of fevers, body aches, and nausea over the past 4 to 5 days.  She overall appears well, is afebrile here with vitals not concerning  for sepsis.  Her symptoms sound most consistent with a viral syndrome and we will perform COVID-19 testing.  Her chest x-ray is clear, but UA is concerning for UTI.  She has no symptoms to suggest pyelonephritis and would be appropriate for outpatient antibiotic treatment.  While she has an allergy listed to Rocephin, she states she has previously tolerated Keflex without any difficulty.  We will give a dose of Keflex here as well as Compazine for her nausea, as this has no QT prolonging effect compared to Zofran or Phenergan.  Lab work here is remarkable only for hyperglycemia with no evidence of DKA, pregnancy testing negative.  Hyperglycemia likely to improve with treatment of her UTI.  Patient tolerated Keflex here without difficulty, will prescribe for home use.  I would avoid antiemetics in this patient with history of long QT syndrome.  Counseled patient to quarantine until her COVID-19 results are known.  Counseled patient to follow-up with PCP and to otherwise return to the ED for new or worsening symptoms.      ____________________________________________   FINAL CLINICAL IMPRESSION(S) / ED DIAGNOSES  Final diagnoses:  Lower urinary tract infectious disease  Fever, unspecified fever cause  Myalgia  Suspected COVID-19 virus infection     ED Discharge Orders         Ordered    cephALEXin (KEFLEX) 500 MG capsule  2 times daily     03/05/19 1446           Note:  This document was prepared using Dragon voice recognition software and may include unintentional dictation errors.   Blake Divine, MD 03/05/19 1447

## 2019-03-06 LAB — NOVEL CORONAVIRUS, NAA (HOSP ORDER, SEND-OUT TO REF LAB; TAT 18-24 HRS): SARS-CoV-2, NAA: NOT DETECTED

## 2019-03-08 LAB — URINE CULTURE: Culture: >=100000 — AB

## 2019-03-09 NOTE — Progress Notes (Signed)
Brief Pharmacy Note  Patient is a 43 y/o female with possible history of diabetes and long QT syndrome who presented to [facility] ED 12/14 with chief complaint of hyperglycemia, fever, chills, and headache. Denied dysuria, hematuria, flank pain. UA with few bacteria, >50 RBC, >50 WBC. Discharge antibiotic cephalexin. Urine culture from ED visit has resulted >100k colonies/mL E coli (pan-sensitive). Discharge antibiotic covers identified bacteria.   [place] Resident 09 March 2019

## 2019-03-18 ENCOUNTER — Telehealth: Payer: Medicaid Other

## 2019-03-21 MED FILL — metFORMIN HCL ER 500 MG TB2: 500 | 30 days supply | Qty: 120 | Fill #1

## 2019-03-21 MED FILL — PROPRANOLOL ER 80 MG CAP: 80 | 30 days supply | Qty: 30 | Fill #2

## 2019-03-21 MED FILL — ATORVASTATIN 10 MG TABLET: 10 | 30 days supply | Qty: 30 | Fill #2

## 2019-04-17 MED FILL — $LANTUS SOLOSTAR 100 UNITS/: 100 | 36 days supply | Qty: 9 | Fill #3

## 2019-05-02 MED FILL — ATORVASTATIN 10 MG TABLET: 10 | 30 days supply | Qty: 30 | Fill #3

## 2019-05-02 MED FILL — PROPRANOLOL ER 80 MG CAP: 80 | 30 days supply | Qty: 30 | Fill #3

## 2019-05-28 MED FILL — $LANTUS SOLOSTAR 100 UNITS/: 100 | 36 days supply | Qty: 9 | Fill #4

## 2019-05-28 MED FILL — metFORMIN HCL ER 500 MG TB2: 500 | 30 days supply | Qty: 120 | Fill #2

## 2019-06-06 MED FILL — PROPRANOLOL ER 80 MG CAP: 80 | 30 days supply | Qty: 30 | Fill #4

## 2019-06-14 ENCOUNTER — Telehealth (INDEPENDENT_AMBULATORY_CARE_PROVIDER_SITE_OTHER): Payer: Medicaid Other | Admitting: Cardiology

## 2019-06-14 ENCOUNTER — Encounter: Payer: Self-pay | Admitting: Cardiology

## 2019-06-14 VITALS — Ht 70.0 in | Wt 185.0 lb

## 2019-06-14 DIAGNOSIS — R002 Palpitations: Secondary | ICD-10-CM

## 2019-06-14 DIAGNOSIS — R42 Dizziness and giddiness: Secondary | ICD-10-CM

## 2019-06-14 DIAGNOSIS — I4581 Long QT syndrome: Secondary | ICD-10-CM

## 2019-06-14 DIAGNOSIS — I10 Essential (primary) hypertension: Secondary | ICD-10-CM | POA: Insufficient documentation

## 2019-06-14 DIAGNOSIS — Z794 Long term (current) use of insulin: Secondary | ICD-10-CM

## 2019-06-14 DIAGNOSIS — Z8241 Family history of sudden cardiac death: Secondary | ICD-10-CM

## 2019-06-14 DIAGNOSIS — E119 Type 2 diabetes mellitus without complications: Secondary | ICD-10-CM

## 2019-06-14 NOTE — Progress Notes (Signed)
Virtual Visit via Video Note   This visit type was conducted due to national recommendations for restrictions regarding the COVID-19 Pandemic (e.g. social distancing) in an effort to limit this patient's exposure and mitigate transmission in our community.  Due to her co-morbid illnesses, this patient is at least at moderate risk for complications without adequate follow up.  This format is felt to be most appropriate for this patient at this time.  All issues noted in this document were discussed and addressed.  A limited physical exam was performed with this format.  Please refer to the patient's chart for her consent to telehealth for Tonya Paul.   The patient was identified using 2 identifiers.  Date:  06/14/2019   ID:  [name], DOB May 02, 1975, MRN [id number]  Patient Location: Home Provider Location: Home  PCP:  Gildardo Pounds, NP  Cardiologist:  Quay Burow, MD  Electrophysiologist:  Dr Caryl Comes  Evaluation Performed:  Follow-Up Visit  Chief Complaint:  Dizziness at work  History of Present Illness:    Tonya Paul is a 44 y.o. female with a family history of sudden cardiac death.  She was evaluated in 06/17/15.  Echocardiogram showed normal LV function.  A treadmill was essentially normal from an ischemic standpoint but she did have a prolonged QT in recovery.  Dr. Caryl Comes is suggested she is at risk for QT syndrome.  He recommended she avoid QT prolonging agents.  Other medical issues include treated hypertension, dyslipidemia, and insulin-dependent diabetes.  She has had palpitations as well in the past.  She wore a monitor in the past but can only wear it for 2 days because of of local allergic reaction.  It was unrevealing for any significant arrhythmia.  Patient last saw Dr. Alvester Chou in October 2020.  She was doing well.  She was contacted today for routine follow-up.  The patient says she works 2 jobs, one at home for [employer] and one at night for [employer].   Apparently her [employer] job is somewhat strenuous.  She is loading boxes.  At times she feels dizzy and lightheaded.  She denies any palpitations.  She denies any syncope or near syncope.  She also wants to start working out at the gym strenuously.  She asked if she should have a treadmill prior to this and I felt this was a reasonable request.  The patient does not have symptoms concerning for COVID-19 infection (fever, chills, cough, or new shortness of breath).    Past Medical History:  Diagnosis Date  . Diabetes mellitus without complication ([place])   . Hemorrhoids 11/18/2015  . Hypertension   . Left breast mass 09/01/2015  . 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 Palpitations 02/03/2016   PVCs seen on monitor  . Vertigo    Past Surgical History:  Procedure Laterality Date  . BREAST BIOPSY Left 09/03/2015   [place] Core bx, benign  . ESOPHAGOGASTRODUODENOSCOPY (EGD) WITH PROPOFOL N/A 06/21/2017   Procedure: ESOPHAGOGASTRODUODENOSCOPY (EGD) WITH PROPOFOL;  Surgeon: Lin Landsman, MD;  Location: [place];  Service: Gastroenterology;  Laterality: N/A;     Current Meds  Medication Sig  . Blood Glucose Monitoring Suppl (TRUE METRIX METER) DEVI 1 each by Does not apply route 3 (three) times daily before meals.  Marland Kitchen glucose blood (RELION GLUCOSE TEST STRIPS) test strip Use as instructed  . Insulin Syringe-Needle U-100 (B-D INS SYRINGE 0.5CC/31GX5/16) 31G X 5/16" 0.5 ML MISC USE AS DIRECTED DAILY FOR INSULIN  ADMINISTRATION  . LANTUS SOLOSTAR 100 UNIT/ML Solostar Pen INJECT 25 UNITS INTO THE SKIN AT BEDTIME.  . metFORMIN (GLUCOPHAGE-XR) 500 MG 24 hr tablet TAKE 2 TABLETS (1,000 MG TOTAL) BY MOUTH 2 (TWO) TIMES DAILY.  Marland Kitchen propranolol ER (INDERAL LA) 80 MG 24 hr capsule TAKE 1 CAPSULE BY MOUTH ONCE A DAY. NEEDS OFFICE VISIT FOR MORE REFILLS.  Marland Kitchen triamcinolone cream (KENALOG) 0.1 % Apply 1 application topically 2 (two) times daily.  . TRUEPLUS PEN NEEDLES 31G X 5 MM MISC USE AS  INSTRUCTED     Allergies:   Ace inhibitors, Aspirin, Rocephin [ceftriaxone], Ciprofloxacin, and Prednisone   Social History   Tobacco Use  . Smoking status: Never Smoker  . Smokeless tobacco: Never Used  Substance Use Topics  . Alcohol use: No  . Drug use: No     Family Hx: The patient's family history includes Breast cancer in her paternal aunt; Diabetes in her sister; Hypertension in her mother; Lung cancer in her mother; Lupus in her maternal grandmother; Stroke in her maternal grandfather; Sudden Cardiac Death in her daughter; Throat cancer in her father.  ROS:   Please see the history of present illness.    Seen in ED Dec 2020 with UTI All other systems reviewed and are negative.   Prior CV studies:   The following studies were reviewed today: Echo 2017 GXT 2017  Labs/Other Tests and Data Reviewed:    EKG:  An ECG dated 01/19/2019 was personally reviewed today and demonstrated:  NSR QTC 452, HR 86  Recent Labs: 01/05/2019: ALT 21 03/05/2019: BUN 8; Creatinine, Ser 0.83; Hemoglobin 11.5; Platelets 351; Potassium 3.4; Sodium 134   Recent Lipid Panel Lab Results  Component Value Date/Time   CHOL 155 01/05/2019 11:24 AM   TRIG 96 01/05/2019 11:24 AM   HDL 39 (L) 01/05/2019 11:24 AM   CHOLHDL 4.0 01/05/2019 11:24 AM   CHOLHDL 3.6 04/18/2015 09:36 AM   LDLCALC 98 01/05/2019 11:24 AM    Wt Readings from Last 3 Encounters:  06/14/19 185 lb (83.9 kg)  03/05/19 185 lb (83.9 kg)  03/03/19 185 lb (83.9 kg)     Objective:    Vital Signs:  Ht 5\' 10"  (1.778 m)   Wt 185 lb (83.9 kg)   BMI 26.54 kg/m    VITAL SIGNS:  reviewed  ASSESSMENT & PLAN:    Dizziness- Some "light headedness" while at work- 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   Long QT Noted on GXT 2017  Fm Hx SCD- Work up 2018  IDDM- No history of neuropathy  HTN- Controlled  Plan- I will order a GXT- she wants to start working out.  F/U Dr Gwenlyn Found in 6 months.   She knows to avoid QT prolonging agents.   COVID-19 Education: The signs and symptoms of COVID-19 were discussed with the patient and how to seek care for testing (follow up with PCP or arrange E-visit).  The importance of social distancing was discussed today.  Time:   Today, I have spent 20 minutes with the patient with telehealth technology discussing the above problems.     Medication Adjustments/Labs and Tests Ordered: Current medicines are reviewed at length with the patient today.  Concerns regarding medicines are outlined above.   Tests Ordered: No orders of the defined types were placed in this encounter.   Medication Changes: No orders of the defined types were placed in this encounter.   Follow Up:  In Person Dr Gwenlyn Found in 6 months  Signed, Kerin Ransom, Hershal Coria  06/14/2019 11:25 AM    [place]

## 2019-06-14 NOTE — Patient Instructions (Signed)
Medication Instructions:   OK to hold beta blocker for stress test.  *If you need a refill on your cardiac medications before your next appointment, please call your pharmacy*   Testing/Procedures: Your physician has requested that you have an exercise tolerance test. For further information please visit [email or web address]. Please also follow instruction sheet, as given.  Our office will contact you to schedule this.   Follow-Up: At [facility], you and your health needs are our priority.  As part of our continuing mission to provide you with exceptional heart care, we have created designated Provider Care Teams.  These Care Teams include your primary Cardiologist (physician) and Advanced Practice Providers (APPs -  Physician Assistants and Nurse Practitioners) who all work together to provide you with the care you need, when you need it.  We recommend signing up for the patient portal called "MyChart".  Sign up information is provided on this After Visit Summary.  MyChart is used to connect with patients for Virtual Visits (Telemedicine).  Patients are able to view lab/test results, encounter notes, upcoming appointments, etc.  Non-urgent messages can be sent to your provider as well.   To learn more about what you can do with MyChart, go to [email or web address].    Your next appointment:   6 month(s)  The format for your next appointment:   In Person  Provider:   You may see Quay Burow, MD or one of the following Advanced Practice Providers on your designated Care Team:    Kerin Ransom, PA-C  [place], [place]  Coletta Memos, [place]    Other Instructions Please call our office 2 months in advance to schedule a 6 month follow-up appointment.

## 2019-06-14 NOTE — Addendum Note (Signed)
Addended by: Therisa Doyne on: 06/14/2019 03:55 PM   Modules accepted: Orders

## 2019-06-25 ENCOUNTER — Ambulatory Visit (INDEPENDENT_AMBULATORY_CARE_PROVIDER_SITE_OTHER)
Admission: RE | Admit: 2019-06-25 | Discharge: 2019-06-25 | Disposition: A | Discharge status: Home / Self Care | Payer: Self-pay | Source: Ambulatory Visit

## 2019-06-25 DIAGNOSIS — J0101 Acute recurrent maxillary sinusitis: Secondary | ICD-10-CM

## 2019-06-25 DIAGNOSIS — R0789 Other chest pain: Secondary | ICD-10-CM

## 2019-06-25 DIAGNOSIS — E119 Type 2 diabetes mellitus without complications: Secondary | ICD-10-CM

## 2019-06-25 DIAGNOSIS — J3089 Other allergic rhinitis: Secondary | ICD-10-CM

## 2019-06-25 MED ORDER — LEVOCETIRIZINE DIHYDROCHLORIDE 5 MG PO TABS: 5.0000 mg | ORAL_TABLET | Freq: Every evening | ORAL | 0 refills | Status: DC

## 2019-06-25 MED ORDER — AMOXICILLIN-POT CLAVULANATE 875-125 MG PO TABS: 1.0000 | ORAL_TABLET | Freq: Two times a day (BID) | ORAL | 0 refills | Status: DC

## 2019-06-25 MED ORDER — FLUTICASONE PROPIONATE 50 MCG/ACT NA SUSP: 2.0000 | Freq: Every day | NASAL | 0 refills | Status: DC

## 2019-06-25 NOTE — ED Provider Notes (Signed)
Virtual Visit via Video Note:  Tonya Paul  initiated request for Telemedicine visit with [facility] team. I connected with Tonya Paul  on 06/25/2019 at 12:00 PM  for a synchronized telemedicine visit using a video enabled HIPPA compliant telemedicine application. I verified that I am speaking with Tonya Paul  using two identifiers. Tonya Eagles, PA-C  was physically located in a [facility] Urgent care site and Tonya Paul was located at a different location.   The limitations of evaluation and management by telemedicine as well as the availability of in-person appointments were discussed. Patient was informed that she  may incur a bill ( including co-pay) for this virtual visit encounter. Tonya Paul  expressed understanding and gave verbal consent to proceed with virtual visit.     History of Present Illness:Tonya Paul  is a 44 y.o. female presents with 2 week history of runny stuffy nose, scratchy throat, puffy eyes, ear itching, slight intermittent sinus headache. Has had pressure in her chest when she's outdoors.  However, she also works for [employer] and has constant exposure to allergens.  Has been using benadryl, Claritin. Denies fever, chest pain. Denies smoking cigarettes. Has diabetes, blood sugars are below 150s.  Reports history of significant discomfort with taking prednisone.  She has a history of long QT syndrome, had palpitations when she took prednisone previously and does not want to use this at all.  She has previously taken amoxicillin and Augmentin without any issue.  ROS  No current facility-administered medications for this encounter.   Current Outpatient Medications  Medication Sig Dispense Refill  . atorvastatin (LIPITOR) 10 MG tablet Take 1 tablet (10 mg total) by mouth daily. 90 tablet 3  . Blood Glucose Monitoring Suppl (TRUE METRIX METER) DEVI 1 each by Does not apply route 3 (three) times daily before meals. 1 Device 0  . ferrous sulfate 324 (65  Fe) MG TBEC Take 1 tablet (325 mg total) by mouth 2 (two) times daily. 180 tablet 1  . glucose blood (RELION GLUCOSE TEST STRIPS) test strip Use as instructed 100 each 12  . Insulin Syringe-Needle U-100 (B-D INS SYRINGE 0.5CC/31GX5/16) 31G X 5/16" 0.5 ML MISC USE AS DIRECTED DAILY FOR INSULIN ADMINISTRATION 100 each 1  . LANTUS SOLOSTAR 100 UNIT/ML Solostar Pen INJECT 25 UNITS INTO THE SKIN AT BEDTIME. 15 mL 5  . metFORMIN (GLUCOPHAGE-XR) 500 MG 24 hr tablet TAKE 2 TABLETS (1,000 MG TOTAL) BY MOUTH 2 (TWO) TIMES DAILY. 120 tablet 2  . propranolol ER (INDERAL LA) 80 MG 24 hr capsule TAKE 1 CAPSULE BY MOUTH ONCE A DAY. NEEDS OFFICE VISIT FOR MORE REFILLS. 90 capsule 1  . triamcinolone cream (KENALOG) 0.1 % Apply 1 application topically 2 (two) times daily. 30 g 0  . TRUEPLUS PEN NEEDLES 31G X 5 MM MISC USE AS INSTRUCTED 100 each 1     Allergies  Allergen Reactions  . Ace Inhibitors Shortness Of Breath    Reported by the patient after a trial of lisinopril prescribes by PCP  . Aspirin Anaphylaxis  . Rocephin [Ceftriaxone] Itching  . Ciprofloxacin Itching  . Prednisone Palpitations     Past Medical History:  Diagnosis Date  . Diabetes mellitus without complication ([place])   . Hemorrhoids 11/18/2015  . Hypertension   . Left breast mass 09/01/2015  . 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 Palpitations 02/03/2016   PVCs seen on monitor  . Vertigo     Past Surgical History:  Procedure Laterality Date  . BREAST BIOPSY Left 09/03/2015   [place] Core bx, benign  . ESOPHAGOGASTRODUODENOSCOPY (EGD) WITH PROPOFOL N/A 06/21/2017   Procedure: ESOPHAGOGASTRODUODENOSCOPY (EGD) WITH PROPOFOL;  Surgeon: Lin Landsman, MD;  Location: [place];  Service: Gastroenterology;  Laterality: N/A;      Observations/Objective: Physical Exam Constitutional:      General: She is not in acute distress.    Appearance: Normal appearance. She is well-developed. She is not ill-appearing,  toxic-appearing or diaphoretic.  HENT:     Nose: Rhinorrhea present.     Comments: Constantly sniffling during video visit.  Endorses pain over maxillary regions on either side bilaterally when she presses there. Eyes:     Extraocular Movements: Extraocular movements intact.  Pulmonary:     Effort: Pulmonary effort is normal.  Neurological:     General: No focal deficit present.     Mental Status: She is alert and oriented to person, place, and time.  Psychiatric:        Mood and Affect: Mood normal.        Behavior: Behavior normal.        Thought Content: Thought content normal.        Judgment: Judgment normal.      Assessment and Plan:  1. Acute recurrent maxillary sinusitis   2. Seasonal allergic rhinitis due to other allergic trigger   3. Chest tightness   4. Well controlled type 2 diabetes mellitus ([place])    Start Augmentin to address a recurrent sinus infection secondary to uncontrolled allergic rhinitis.  Discussed use of prednisone if symptoms persist for severe allergic rhinitis.  For now, patient is to start Xyzal ASAP and start Flonase in about 2 weeks when she has cleared her recurrent sinus infection. Counseled patient on potential for adverse effects with medications prescribed/recommended today, ER and return-to-clinic precautions discussed, patient verbalized understanding.    Follow Up Instructions:    I discussed the assessment and treatment plan with the patient. The patient was provided an opportunity to ask questions and all were answered. The patient agreed with the plan and demonstrated an understanding of the instructions.   The patient was advised to call back or seek an in-person evaluation if the symptoms worsen or if the condition fails to improve as anticipated.  I provided 15 minutes of non-face-to-face time during this encounter.    Tonya Eagles, PA-C  06/25/2019 12:00 PM         Tonya Eagles, PA-C 06/25/19 1213

## 2019-06-25 NOTE — Discharge Instructions (Addendum)
Start Augmentin to address a recurrent sinus infection from having really bad allergies. Start taking Xyzal daily for control of your allergies. In about 2 weeks, go ahead and start the Flonase nasal steroid.

## 2019-06-29 ENCOUNTER — Other Ambulatory Visit (HOSPITAL_COMMUNITY): Payer: Medicaid Other

## 2019-07-03 ENCOUNTER — Inpatient Hospital Stay (HOSPITAL_COMMUNITY): Admission: RE | Admit: 2019-07-03 | Payer: Medicaid Other | Source: Ambulatory Visit

## 2019-07-05 ENCOUNTER — Telehealth (HOSPITAL_COMMUNITY): Payer: Self-pay

## 2019-07-05 NOTE — Telephone Encounter (Signed)
Encounter complete. 

## 2019-07-06 ENCOUNTER — Inpatient Hospital Stay (HOSPITAL_COMMUNITY): Admission: RE | Admit: 2019-07-06 | Payer: Medicaid Other | Source: Ambulatory Visit

## 2019-07-10 ENCOUNTER — Ambulatory Visit (HOSPITAL_COMMUNITY)
Admission: RE | Admit: 2019-07-10 | Payer: Medicaid Other | Source: Ambulatory Visit | Attending: Cardiology | Admitting: Cardiology

## 2019-07-10 ENCOUNTER — Telehealth (HOSPITAL_COMMUNITY): Payer: Self-pay | Admitting: Cardiology

## 2019-07-10 NOTE — Telephone Encounter (Signed)
Called and cancelled appointment for ETT per Gwen a the [place] office due to patient No Showed COVID test. Left a voicemail to call the office to reschedule @ 8:00am.

## 2019-07-14 DIAGNOSIS — Z794 Long term (current) use of insulin: Secondary | ICD-10-CM | POA: Insufficient documentation

## 2019-07-14 DIAGNOSIS — I10 Essential (primary) hypertension: Secondary | ICD-10-CM | POA: Diagnosis not present

## 2019-07-14 DIAGNOSIS — T7840XA Allergy, unspecified, initial encounter: Secondary | ICD-10-CM | POA: Diagnosis not present

## 2019-07-14 DIAGNOSIS — E119 Type 2 diabetes mellitus without complications: Secondary | ICD-10-CM | POA: Diagnosis not present

## 2019-07-14 DIAGNOSIS — L509 Urticaria, unspecified: Secondary | ICD-10-CM | POA: Diagnosis present

## 2019-07-14 DIAGNOSIS — R0789 Other chest pain: Secondary | ICD-10-CM | POA: Insufficient documentation

## 2019-07-14 DIAGNOSIS — Z79899 Other long term (current) drug therapy: Secondary | ICD-10-CM | POA: Diagnosis not present

## 2019-07-14 MED ORDER — METHYLPREDNISOLONE SODIUM SUCC 125 MG IJ SOLR
125.0000 mg | Freq: Once | INTRAMUSCULAR | Status: AC
  Administered 2019-07-14: 125 mg via INTRAVENOUS

## 2019-07-14 MED ORDER — DIPHENHYDRAMINE HCL 50 MG/ML IJ SOLN
50.0000 mg | Freq: Once | INTRAMUSCULAR | Status: AC
  Administered 2019-07-14: 50 mg via INTRAVENOUS

## 2019-07-14 MED ORDER — FAMOTIDINE IN NACL 20-0.9 MG/50ML-% IV SOLN
20.0000 mg | Freq: Once | INTRAVENOUS | Status: AC
  Administered 2019-07-14: 20 mg via INTRAVENOUS

## 2019-07-14 NOTE — ED Triage Notes (Addendum)
Allergic reaction to unknown substance 10 minutes ago. Itching, SOB, chest tightness. Hives present on face. Reports difficulty breathing. Upper lip swelling Pt scratching at face, arms, body. RR even, lungs clear.  Pt took 1 OTC Claritin PTA Charge RN and first nurse aware of need for treatment bed.  Consulted with EDP for orders

## 2019-07-14 NOTE — ED Notes (Addendum)
Reports chest tightness is getting better. Agricultural consultant and first nurse aware of need for treatment bed.

## 2019-07-15 ENCOUNTER — Emergency Department
Admission: EM | Admit: 2019-07-15 | Discharge: 2019-07-15 | Disposition: A | Discharge status: Home / Self Care | Payer: Managed Care, Other (non HMO) | Attending: Emergency Medicine | Admitting: Emergency Medicine

## 2019-07-15 DIAGNOSIS — T7840XA Allergy, unspecified, initial encounter: Secondary | ICD-10-CM

## 2019-07-15 MED ORDER — PROCHLORPERAZINE EDISYLATE 10 MG/2ML IJ SOLN
10.0000 mg | Freq: Once | INTRAMUSCULAR | Status: AC
  Administered 2019-07-15: 10 mg via INTRAVENOUS
  Filled 2019-07-15: qty 2

## 2019-07-15 MED ORDER — CETIRIZINE HCL 10 MG PO TABS: 10.0000 mg | ORAL_TABLET | Freq: Every day | ORAL | 0 refills | Status: DC

## 2019-07-15 MED ORDER — EPINEPHRINE 0.3 MG/0.3ML IJ SOAJ: 0.3000 mg | INTRAMUSCULAR | 0 refills | Status: DC | PRN

## 2019-07-15 MED ORDER — ONDANSETRON HCL 4 MG/2ML IJ SOLN: 4.0000 mg | Freq: Once | INTRAMUSCULAR | Status: DC

## 2019-07-15 MED ORDER — EPINEPHRINE 0.3 MG/0.3ML IJ SOAJ: 0.3000 mg | Freq: Once | INTRAMUSCULAR | Status: DC

## 2019-07-15 NOTE — Discharge Instructions (Signed)
Take cetirizine once a day for the next 7 days.  Because of your condition of prolonged QT a lot of the medications that we usually treat allergic reaction including Benadryl and Pepcid can cause worsening of this condition which may lead to an abnormal heart rhythm and sudden death.  Therefore try to avoid these medications.  This is also true for an EpiPen.  However if you ever develop severe respiratory distress, feels like your throat is closing, you are unable to breathe you might need to use an EpiPen to save your life.  Therefore I am giving a prescription for 1 pen.  Make sure to keep it with you at all times.  Follow-up with your doctor in 2 to 3 days.  Return to the emergency room for swelling of your lips, throat closing sensation, chest pain or shortness of breath.

## 2019-07-15 NOTE — ED Provider Notes (Signed)
[facility] Emergency Department Provider Note  ____________________________________________  Time seen: Approximately 12:24 AM  I have reviewed the triage vital signs and the nursing notes.   HISTORY  Chief Complaint Allergic Reaction   HPI Tonya Paul is a 44 y.o. female history of prolonged QT syndrome, diabetes, hypertension, hyperlipidemia who presents for evaluation of allergic reaction.  Patient reports that she was sitting at home watching TV when she broke out into hives.  She feels itchy in her throat and nauseous.  She has some chest tightness and difficulty breathing which has now resolved.  Patient reports history of allergic reactions as a child but none as an adult.  She denies any new medications, new vaccines, new foods, new soaps or detergents.  She reports eating seafood at 5 PM which she eats all the time.  Also reports taking some over-the-counter ibuprofen earlier today which she has had many times in the past.  She denies any throat closing sensation, vomiting or diarrhea.   Past Medical History:  Diagnosis Date  . Diabetes mellitus without complication ([place])   . Hemorrhoids 11/18/2015  . Hypertension   . Left breast mass 09/01/2015  . 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 Palpitations 02/03/2016   PVCs seen on monitor  . Vertigo     Patient Active Problem List   Diagnosis Date Noted  . Essential hypertension 06/14/2019  . Dizziness 06/14/2019  . Abdominal pain, chronic, epigastric   . GERD (gastroesophageal reflux disease) 06/20/2017  . Long Q-T syndrome 01/14/2017  . Palpitations 02/03/2016  . Uterine fibroid 12/11/2015  . Family history of sudden cardiac death 12-09-15  . Hemorrhoids 11/18/2015  . Left breast mass 09/01/2015  . Obesity 09/01/2015  . Insulin dependent type 2 diabetes mellitus ([place]) 04/16/2015    Past Surgical History:  Procedure Laterality Date  . BREAST BIOPSY Left 09/03/2015   [place]  Core bx, benign  . ESOPHAGOGASTRODUODENOSCOPY (EGD) WITH PROPOFOL N/A 06/21/2017   Procedure: ESOPHAGOGASTRODUODENOSCOPY (EGD) WITH PROPOFOL;  Surgeon: Lin Landsman, MD;  Location: [place];  Service: Gastroenterology;  Laterality: N/A;    Prior to Admission medications   Medication Sig Start Date End Date Taking? Authorizing Provider  amoxicillin-clavulanate (AUGMENTIN) 875-125 MG tablet Take 1 tablet by mouth 2 (two) times daily. 06/25/19   Jaynee Eagles, PA-C  atorvastatin (LIPITOR) 10 MG tablet Take 1 tablet (10 mg total) by mouth daily. 01/22/19 04/22/19  Lorretta Harp, MD  Blood Glucose Monitoring Suppl (TRUE METRIX METER) DEVI 1 each by Does not apply route 3 (three) times daily before meals. 04/16/15   Charlott Rakes, MD  cetirizine (ZYRTEC ALLERGY) 10 MG tablet Take 1 tablet (10 mg total) by mouth daily for 7 days. 07/15/19 07/22/19  Rudene Re, MD  EPINEPHrine 0.3 mg/0.3 mL IJ SOAJ injection Inject 0.3 mLs (0.3 mg total) into the muscle as needed for anaphylaxis. 07/15/19   Rudene Re, MD  ferrous sulfate 324 (65 Fe) MG TBEC Take 1 tablet (325 mg total) by mouth 2 (two) times daily. 01/10/19 04/10/19  Gildardo Pounds, NP  fluticasone (FLONASE) 50 MCG/ACT nasal spray Place 2 sprays into both nostrils daily. 06/25/19   Jaynee Eagles, PA-C  glucose blood (RELION GLUCOSE TEST STRIPS) test strip Use as instructed 07/30/16   Alfonse Spruce, FNP  Insulin Syringe-Needle U-100 (B-D INS SYRINGE 0.5CC/31GX5/16) 31G X 5/16" 0.5 ML MISC USE AS DIRECTED DAILY FOR INSULIN ADMINISTRATION 11/29/16   Alfonse Spruce, FNP  LANTUS SOLOSTAR 100 UNIT/ML Solostar Pen INJECT 25 UNITS INTO THE SKIN AT BEDTIME. 08/21/18   Gildardo Pounds, NP  levocetirizine (XYZAL) 5 MG tablet Take 1 tablet (5 mg total) by mouth every evening. 06/25/19   Jaynee Eagles, PA-C  metFORMIN (GLUCOPHAGE-XR) 500 MG 24 hr tablet TAKE 2 TABLETS (1,000 MG TOTAL) BY MOUTH 2 (TWO) TIMES DAILY. 01/03/19   Charlott Rakes,  MD  propranolol ER (INDERAL LA) 80 MG 24 hr capsule TAKE 1 CAPSULE BY MOUTH ONCE A DAY. NEEDS OFFICE VISIT FOR MORE REFILLS. 01/22/19   Lorretta Harp, MD  triamcinolone cream (KENALOG) 0.1 % Apply 1 application topically 2 (two) times daily. 07/07/18   Norval Gable, MD  TRUEPLUS PEN NEEDLES 31G X 5 MM MISC USE AS INSTRUCTED 08/18/18   Gildardo Pounds, NP    Allergies Ace inhibitors, Aspirin, Rocephin [ceftriaxone], Ciprofloxacin, and Prednisone  Family History  Problem Relation Age of Onset  . Hypertension Mother   . Lung cancer Mother   . Throat cancer Father   . Diabetes Sister   . Lupus Maternal Grandmother   . Stroke Maternal Grandfather   . Sudden Cardiac Death Daughter   . Breast cancer Paternal Aunt     Social History Social History   Tobacco Use  . Smoking status: Never Smoker  . Smokeless tobacco: Never Used  Substance Use Topics  . Alcohol use: No  . Drug use: No    Review of Systems  Constitutional: Negative for fever. Eyes: Negative for visual changes. ENT: Negative for sore throat. Neck: No neck pain  Cardiovascular: + chest tightness Respiratory: + shortness of breath. Gastrointestinal: Negative for abdominal pain, vomiting or diarrhea. Genitourinary: Negative for dysuria. Musculoskeletal: Negative for back pain. Skin: + hives Neurological: Negative for headaches, weakness or numbness. Psych: No SI or HI  ____________________________________________   PHYSICAL EXAM:  VITAL SIGNS: ED Triage Vitals  Enc Vitals Group     BP 07/14/19 2338 (!) 132/101     Pulse Rate 07/14/19 2338 97     Resp 07/14/19 2338 (!) 22     Temp 07/14/19 2338 98.8 F (37.1 C)     Temp Source 07/14/19 2338 Oral     SpO2 07/14/19 2338 99 %     Weight 07/14/19 2339 175 lb (79.4 kg)     Height 07/14/19 2339 5\' 11"  (1.803 m)     Head Circumference --      Peak Flow --      Pain Score 07/14/19 2339 0     Pain Loc --      Pain Edu? --      Excl. in [place]? --      Constitutional: Alert and oriented. Well appearing and in no apparent distress. HEENT:      Head: Normocephalic and atraumatic.         Eyes: Conjunctivae are normal. Sclera is non-icteric.       Mouth/Throat: Mucous membranes are moist. Mild upper lip swelling.  Pharynx is clear with no swelling of the tongue or uvula, airways patent, no stridor      Neck: Supple with no signs of meningismus. Cardiovascular: Regular rate and rhythm. No murmurs, gallops, or rubs. Respiratory: Normal respiratory effort. Lungs are clear to auscultation bilaterally. No wheezes, crackles, or rhonchi.  Gastrointestinal: Soft, non tender Musculoskeletal:  No edema, cyanosis, or erythema of extremities. Neurologic: Normal speech and language. Face is symmetric. Moving all extremities. No gross focal neurologic deficits are appreciated. Skin: Skin is  warm, dry and intact. Mild hives on torso and face Psychiatric: Mood and affect are normal. Speech and behavior are normal.  ____________________________________________   LABS (all labs ordered are listed, but only abnormal results are displayed)  Labs Reviewed - No data to display ____________________________________________  EKG ED ECG REPORT I, Rudene Re, the attending physician, personally viewed and interpreted this ECG.  Normal sinus rhythm, rate of 95, normal intervals, normal axis, no ST elevations or depressions.  Normal EKG. ____________________________________________  RADIOLOGY  none  ____________________________________________   PROCEDURES  Procedure(s) performed:yes .1-3 Lead EKG Interpretation Performed by: Rudene Re, MD Authorized by: Rudene Re, MD     Interpretation: normal     ECG rate:  95   ECG rate assessment: normal     Rhythm: sinus rhythm     Ectopy: none     Conduction: normal     Critical Care performed:  None ____________________________________________   INITIAL IMPRESSION /  ASSESSMENT AND PLAN / ED COURSE  44 y.o. female history of prolonged QT syndrome, diabetes, hypertension, hyperlipidemia who presents for evaluation of allergic reaction known allergen.  Patient with hives, nausea, mild upper lip swelling, chest tightness and shortness of breath.  We will try to hold off on epi at this time since patient has a history of prolonged QT syndrome and a family history of sudden death.  Patient is not in extremis, has very minimal upper lip swelling, no intraoral swelling, no wheezing, no stridor, normal stable vital signs and some mild hives.  She reports that the chest tightness and shortness of breath resolved after Benadryl.  Review of prior medical records was done.  EKG here showing normal QTC of 454 and no acute ischemic changes.  Patient was given IV Pepcid, Solu-Medrol, Benadryl, and Compazine for nausea.  Will monitor closely for any deterioration and need of an EpiPen.  She was placed on telemetry for close monitoring.  _________________________ 1:57 AM on 07/15/2019 -----------------------------------------  Patient monitored for 2 hours post medications.  Did not require an EpiPen.  Symptoms have resolved.  She remains hemodynamically stable with no signs of anaphylaxis.  Will hold off prescribing steroids since patient is not on short acting insulin and due to concerns of possible hyperglycemia/DKA.  Will hold off prescribing Pepcid and Benadryl due to history of prolonged QT.  I discussed with the patient to take cetirizine at home for hives.  I am providing her with a prescription for EpiPen.  I again explained to her that EpiPen can exacerbate a prolonged QT causing fatal dysrhythmia.  However if patient does have an anaphylactic response that includes severe respiratory distress or respiratory arrest that she will need an EpiPen.  Discussed with her only to administer EpiPen in case of severe respiratory symptoms.  Patient does understand these recommendations  which were also written on her discharge instructions.  At this time she is stable for discharge home with follow-up with her PCP.    _____________________________________________ Please note:  Patient was evaluated in Emergency Department today for the symptoms described in the history of present illness. Patient was evaluated in the context of the global COVID-19 pandemic, which necessitated consideration that the patient might be at risk for infection with the SARS-CoV-2 virus that causes COVID-19. Institutional protocols and algorithms that pertain to the evaluation of patients at risk for COVID-19 are in a state of rapid change based on information released by regulatory bodies including the CDC and federal and state organizations. These policies and algorithms  were followed during the patient's care in the ED.  Some ED evaluations and interventions may be delayed as a result of limited staffing during the pandemic.   [place] Controlled Substance Database was reviewed by me. ____________________________________________   FINAL CLINICAL IMPRESSION(S) / ED DIAGNOSES   Final diagnoses:  Allergic reaction, initial encounter      NEW MEDICATIONS STARTED DURING THIS VISIT:  ED Discharge Orders         Ordered    cetirizine (ZYRTEC ALLERGY) 10 MG tablet  Daily     07/15/19 0154    EPINEPHrine 0.3 mg/0.3 mL IJ SOAJ injection  As needed     07/15/19 0154           Note:  This document was prepared using Dragon voice recognition software and may include unintentional dictation errors.    Alfred Levins, [place], MD 07/15/19 [phone]

## 2019-07-15 NOTE — ED Notes (Addendum)
Pt sitting within sight of this RN. ED tech accompanying patient. Pt RR even and unlabored, able to speak in clear and complete sentences. Awaiting bed for patient to be placed in. First nurse and charge RN aware of need for bed.

## 2019-07-15 NOTE — ED Notes (Addendum)
Lip swelling improving, chest tightness improving, hives to face improving, itching improving. Continues to state that she feels like something is in her throat. No tongue swelling at this RN time of assessment. Repeat VS obtained, taken to [place]

## 2019-07-16 MED FILL — PROPRANOLOL ER 80 MG CAP: 80 | 30 days supply | Qty: 30 | Fill #5

## 2019-08-09 ENCOUNTER — Telehealth (HOSPITAL_COMMUNITY): Payer: Self-pay

## 2019-08-09 NOTE — Telephone Encounter (Signed)
Encounter complete. 

## 2019-08-10 ENCOUNTER — Other Ambulatory Visit (HOSPITAL_COMMUNITY)
Admission: RE | Admit: 2019-08-10 | Discharge: 2019-08-10 | Disposition: A | Discharge status: Home / Self Care | Payer: Managed Care, Other (non HMO) | Source: Ambulatory Visit | Attending: Cardiology | Admitting: Cardiology

## 2019-08-10 DIAGNOSIS — Z20822 Contact with and (suspected) exposure to covid-19: Secondary | ICD-10-CM | POA: Insufficient documentation

## 2019-08-10 DIAGNOSIS — Z01812 Encounter for preprocedural laboratory examination: Secondary | ICD-10-CM | POA: Diagnosis present

## 2019-08-10 LAB — SARS CORONAVIRUS 2 (TAT 6-24 HRS): SARS Coronavirus 2: NEGATIVE

## 2019-08-14 ENCOUNTER — Other Ambulatory Visit: Payer: Self-pay | Admitting: Nurse Practitioner

## 2019-08-14 ENCOUNTER — Ambulatory Visit (HOSPITAL_COMMUNITY)
Admission: RE | Admit: 2019-08-14 | Payer: Managed Care, Other (non HMO) | Source: Ambulatory Visit | Attending: Cardiology | Admitting: Cardiology

## 2019-08-14 DIAGNOSIS — Z1231 Encounter for screening mammogram for malignant neoplasm of breast: Secondary | ICD-10-CM

## 2019-08-21 ENCOUNTER — Other Ambulatory Visit: Payer: Self-pay | Admitting: Nurse Practitioner

## 2019-08-21 ENCOUNTER — Ambulatory Visit: Payer: Managed Care, Other (non HMO) | Attending: Nurse Practitioner | Admitting: Nurse Practitioner

## 2019-08-21 ENCOUNTER — Other Ambulatory Visit: Payer: Self-pay

## 2019-08-21 ENCOUNTER — Other Ambulatory Visit: Payer: Self-pay | Admitting: Family Medicine

## 2019-08-21 ENCOUNTER — Encounter: Payer: Self-pay | Admitting: Nurse Practitioner

## 2019-08-21 VITALS — BP 127/82 | HR 72 | Temp 97.7°F | Ht 70.0 in | Wt 206.0 lb

## 2019-08-21 DIAGNOSIS — I10 Essential (primary) hypertension: Secondary | ICD-10-CM

## 2019-08-21 DIAGNOSIS — E1165 Type 2 diabetes mellitus with hyperglycemia: Secondary | ICD-10-CM | POA: Diagnosis not present

## 2019-08-21 DIAGNOSIS — R519 Headache, unspecified: Secondary | ICD-10-CM

## 2019-08-21 DIAGNOSIS — R931 Abnormal findings on diagnostic imaging of heart and coronary circulation: Secondary | ICD-10-CM | POA: Diagnosis not present

## 2019-08-21 DIAGNOSIS — D649 Anemia, unspecified: Secondary | ICD-10-CM

## 2019-08-21 LAB — POCT GLYCOSYLATED HEMOGLOBIN (HGB A1C): Hemoglobin A1C: 7.3 % — AB (ref 4.0–5.6)

## 2019-08-21 LAB — GLUCOSE, POCT (MANUAL RESULT ENTRY): POC Glucose: 111 mg/dl — AB (ref 70–99)

## 2019-08-21 MED ORDER — ATORVASTATIN CALCIUM 10 MG PO TABS: 10.0000 mg | ORAL_TABLET | Freq: Every day | ORAL | 3 refills | Status: DC

## 2019-08-21 MED ORDER — METFORMIN HCL ER 500 MG PO TB24: 1000.0000 mg | ORAL_TABLET | Freq: Two times a day (BID) | ORAL | 2 refills | Status: DC

## 2019-08-21 MED ORDER — BUTALBITAL-APAP-CAFFEINE 50-325-40 MG PO TABS: 1.0000 | ORAL_TABLET | Freq: Four times a day (QID) | ORAL | 0 refills | Status: DC | PRN

## 2019-08-21 MED ORDER — FERROUS SULFATE 324 (65 FE) MG PO TBEC: 1.0000 | DELAYED_RELEASE_TABLET | Freq: Two times a day (BID) | ORAL | 1 refills | Status: DC

## 2019-08-21 MED FILL — ATORVASTATIN 10 MG TABLET: 10 | 30 days supply | Qty: 30 | Fill #4

## 2019-08-21 NOTE — Progress Notes (Signed)
Assessment & Plan:  Tonya Paul was seen today for diabetes.  Diagnoses and all orders for this visit:  Uncontrolled type 2 diabetes mellitus with hyperglycemia (HCC) -     Glucose (CBG) -     HgB A1c -     Microalbumin/Creatinine Ratio, Urine -     CMP14+EGFR -     metFORMIN (GLUCOPHAGE-XR) 500 MG 24 hr tablet; Take 2 tablets (1,000 mg total) by mouth 2 (two) times daily. -     Ambulatory referral to Ophthalmology Will need to start [place] if A1c not at goal next office visit. She is trying to work on her weight and diet.   Persistent headaches -     butalbital-acetaminophen-caffeine (FIORICET) 50-325-40 MG tablet; Take 1-2 tablets by mouth every 6 (six) hours as needed for headache.  Elevated coronary artery calcium score -     atorvastatin (LIPITOR) 10 MG tablet; Take 1 tablet (10 mg total) by mouth daily. -     CBC  Essential hypertension Continue to monitor. May need low dose ARB   Anemia, unspecified type -     ferrous sulfate 324 (65 Fe) MG TBEC; Take 1 tablet (325 mg total) by mouth 2 (two) times daily.    Patient has been counseled on age-appropriate routine health concerns for screening and prevention. These are reviewed and up-to-date. Referrals have been placed accordingly. Immunizations are up-to-date or declined.    Subjective:   Chief Complaint  Patient presents with  . Diabetes    Pt. is here for diabetes follow up.    HPI Tonya Paul 44 y.o. female presents to office today for follow up.  has a past medical history of Diabetes mellitus without complication ([place]), Hemorrhoids (11/18/2015), Hypertension, Left breast mass (09/01/2015), Long Q-T syndrome (01/14/2017), Palpitations (02/03/2016), and Vertigo.   DM TYPE 2 Onset 8 years ago Down from 8.2. Improved. Taking metformin XR 1000 mg BID and lantus . Does not endorse any hypo or hyperglycemic symptoms today. Overdue for eye exam. Referral placed today. She has seen Endocrinology in the past. Has ACE  intolerance. Has taken glipizide 5 mg BID and invokana in the past.  Lab Results  Component Value Date   HGBA1C 7.3 (A) 08/21/2019   Essential Hypertension  States home SBP readings 140s. Endorsing headaches unrelieved with tylenol. BP Well controlled today in office after second blood pressure reading. She does endorse increased stress. Does not currently endorse chest pain, shortness of breath, palpitations, lightheadedness, dizziness,  or BLE edema. Avoids caffeine. Needs to work on weight loss and increase activity. She has been on amlodipine 5 mg in the past as well as lisinopril renal dose. LDL not at goal. Refilling atorvastatin today. There is no history of statin intolerance.  BP Readings from Last 3 Encounters:  08/21/19 127/82  07/15/19 102/79  03/05/19 130/78   Lab Results  Component Value Date   LDLCALC 98 01/05/2019   Review of Systems  Constitutional: Negative for fever, malaise/fatigue and weight loss.  HENT: Negative.  Negative for nosebleeds.   Eyes: Negative.  Negative for blurred vision, double vision and photophobia.  Respiratory: Negative.  Negative for cough and shortness of breath.   Cardiovascular: Negative.  Negative for chest pain, palpitations and leg swelling.  Gastrointestinal: Negative.  Negative for heartburn, nausea and vomiting.  Musculoskeletal: Negative.  Negative for myalgias.  Neurological: Positive for headaches. Negative for dizziness, focal weakness and seizures.  Psychiatric/Behavioral: Negative.  Negative for suicidal ideas.    Past Medical History:  Diagnosis Date  . Diabetes mellitus without complication ([place])   . Hemorrhoids 11/18/2015  . Hypertension   . Left breast mass 09/01/2015  . 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 Palpitations 02/03/2016   PVCs seen on monitor  . Vertigo     Past Surgical History:  Procedure Laterality Date  . BREAST BIOPSY Left 09/03/2015   [place] Core bx, benign  . ESOPHAGOGASTRODUODENOSCOPY  (EGD) WITH PROPOFOL N/A 06/21/2017   Procedure: ESOPHAGOGASTRODUODENOSCOPY (EGD) WITH PROPOFOL;  Surgeon: Lin Landsman, MD;  Location: [facility];  Service: Gastroenterology;  Laterality: N/A;    Family History  Problem Relation Age of Onset  . Hypertension Mother   . Lung cancer Mother   . Throat cancer Father   . Diabetes Sister   . Lupus Maternal Grandmother   . Stroke Maternal Grandfather   . Sudden Cardiac Death Daughter   . Breast cancer Paternal Aunt     Social History Reviewed with no changes to be made today.   Outpatient Medications Prior to Visit  Medication Sig Dispense Refill  . Blood Glucose Monitoring Suppl (TRUE METRIX METER) DEVI 1 each by Does not apply route 3 (three) times daily before meals. 1 Device 0  . EPINEPHrine 0.3 mg/0.3 mL IJ SOAJ injection Inject 0.3 mLs (0.3 mg total) into the muscle as needed for anaphylaxis. 1 each 0  . fluticasone (FLONASE) 50 MCG/ACT nasal spray Place 2 sprays into both nostrils daily. 16 g 0  . glucose blood (RELION GLUCOSE TEST STRIPS) test strip Use as instructed 100 each 12  . Insulin Syringe-Needle U-100 (B-D INS SYRINGE 0.5CC/31GX5/16) 31G X 5/16" 0.5 ML MISC USE AS DIRECTED DAILY FOR INSULIN ADMINISTRATION 100 each 1  . LANTUS SOLOSTAR 100 UNIT/ML Solostar Pen INJECT 25 UNITS INTO THE SKIN AT BEDTIME. 15 mL 5  . levocetirizine (XYZAL) 5 MG tablet Take 1 tablet (5 mg total) by mouth every evening. 90 tablet 0  . propranolol ER (INDERAL LA) 80 MG 24 hr capsule TAKE 1 CAPSULE BY MOUTH ONCE A DAY. NEEDS OFFICE VISIT FOR MORE REFILLS. 90 capsule 1  . TRUEPLUS PEN NEEDLES 31G X 5 MM MISC USE AS INSTRUCTED 100 each 1  . amoxicillin-clavulanate (AUGMENTIN) 875-125 MG tablet Take 1 tablet by mouth 2 (two) times daily. 14 tablet 0  . metFORMIN (GLUCOPHAGE-XR) 500 MG 24 hr tablet TAKE 2 TABLETS (1,000 MG TOTAL) BY MOUTH 2 (TWO) TIMES DAILY. 120 tablet 2  . cetirizine (ZYRTEC ALLERGY) 10 MG tablet Take 1 tablet (10 mg total) by  mouth daily for 7 days. 7 tablet 0  . triamcinolone cream (KENALOG) 0.1 % Apply 1 application topically 2 (two) times daily. (Patient not taking: Reported on 08/21/2019) 30 g 0  . atorvastatin (LIPITOR) 10 MG tablet Take 1 tablet (10 mg total) by mouth daily. 90 tablet 3  . ferrous sulfate 324 (65 Fe) MG TBEC Take 1 tablet (325 mg total) by mouth 2 (two) times daily. 180 tablet 1   No facility-administered medications prior to visit.    Allergies  Allergen Reactions  . Ace Inhibitors Shortness Of Breath    Reported by the patient after a trial of lisinopril prescribes by PCP  . Aspirin Anaphylaxis  . Rocephin [Ceftriaxone] Itching  . Ciprofloxacin Itching  . Prednisone Palpitations       Objective:    BP 127/82 (BP Location: Left Arm, Patient Position: Sitting, Cuff Size: Large)   Pulse 72   Temp 97.7 F (36.5  C) (Temporal)   Ht 5' 10" (1.778 m)   Wt 206 lb (93.4 kg)   SpO2 94%   BMI 29.56 kg/m  Wt Readings from Last 3 Encounters:  08/21/19 206 lb (93.4 kg)  07/14/19 175 lb (79.4 kg)  06/14/19 185 lb (83.9 kg)    Physical Exam Vitals and nursing note reviewed.  Constitutional:      Appearance: She is well-developed.  HENT:     Head: Normocephalic and atraumatic.  Cardiovascular:     Rate and Rhythm: Normal rate and regular rhythm.     Heart sounds: Normal heart sounds. No murmur. No friction rub. No gallop.   Pulmonary:     Effort: Pulmonary effort is normal. No tachypnea or respiratory distress.     Breath sounds: Normal breath sounds. No decreased breath sounds, wheezing, rhonchi or rales.  Chest:     Chest wall: No tenderness.  Abdominal:     General: Bowel sounds are normal.     Palpations: Abdomen is soft.  Musculoskeletal:        General: Normal range of motion.     Cervical back: Normal range of motion.  Skin:    General: Skin is warm and dry.  Neurological:     Mental Status: She is alert and oriented to person, place, and time.     Coordination:  Coordination normal.  Psychiatric:        Behavior: Behavior normal. Behavior is cooperative.        Thought Content: Thought content normal.        Judgment: Judgment normal.          Patient has been counseled extensively about nutrition and exercise as well as the importance of adherence with medications and regular follow-up. The patient was given clear instructions to go to ER or return to medical center if symptoms don't improve, worsen or new problems develop. The patient verbalized understanding.   Follow-up: Return in about 3 months (around 11/21/2019).   Gildardo Pounds, FNP-BC [facility] [place], [place]   08/21/2019, 6:39 PM

## 2019-08-22 ENCOUNTER — Telehealth (HOSPITAL_COMMUNITY): Payer: Self-pay | Admitting: Cardiology

## 2019-08-22 LAB — CBC
Hematocrit: 37.1 % (ref 34.0–46.6)
Hemoglobin: 12.4 g/dL (ref 11.1–15.9)
MCH: 28.2 pg (ref 26.6–33.0)
MCHC: 33.4 g/dL (ref 31.5–35.7)
MCV: 85 fL (ref 79–97)
Platelets: 436 10*3/uL (ref 150–450)
RBC: 4.39 x10E6/uL (ref 3.77–5.28)
RDW: 14 % (ref 11.7–15.4)
WBC: 8 10*3/uL (ref 3.4–10.8)

## 2019-08-22 LAB — CMP14+EGFR
ALT: 19 IU/L (ref 0–32)
AST: 18 IU/L (ref 0–40)
Albumin/Globulin Ratio: 1.5 (ref 1.2–2.2)
Albumin: 4.3 g/dL (ref 3.8–4.8)
Alkaline Phosphatase: 71 IU/L (ref 48–121)
BUN/Creatinine Ratio: 10 (ref 9–23)
BUN: 11 mg/dL (ref 6–24)
Bilirubin Total: <0.2 mg/dL (ref 0.0–1.2)
CO2: 19 mmol/L — ABNORMAL LOW (ref 20–29)
Calcium: 9.6 mg/dL (ref 8.7–10.2)
Chloride: 107 mmol/L — ABNORMAL HIGH (ref 96–106)
Creatinine, Ser: 1.12 mg/dL — ABNORMAL HIGH (ref 0.57–1.00)
GFR calc Af Amer: 70 mL/min/{1.73_m2} (ref >59)
GFR calc non Af Amer: 60 mL/min/{1.73_m2} (ref >59)
Globulin, Total: 2.9 g/dL (ref 1.5–4.5)
Glucose: 103 mg/dL — ABNORMAL HIGH (ref 65–99)
Potassium: 3.9 mmol/L (ref 3.5–5.2)
Sodium: 142 mmol/L (ref 134–144)
Total Protein: 7.2 g/dL (ref 6.0–8.5)

## 2019-08-22 LAB — MICROALBUMIN / CREATININE URINE RATIO
Creatinine, Urine: 207.9 mg/dL
Microalb/Creat Ratio: 70 mg/g creat — ABNORMAL HIGH (ref 0–29)
Microalbumin, Urine: 145.5 ug/mL

## 2019-08-22 MED FILL — BUTALB-ACETAMIN-CAFF 50-325: 50-325-40 | 3 days supply | Qty: 20 | Fill #0

## 2019-08-22 MED FILL — metFORMIN HCL ER 500 MG TB2: 500 | 30 days supply | Qty: 120 | Fill #0

## 2019-08-22 NOTE — Telephone Encounter (Signed)
Just an FYI. We have made several attempts to contact this patient including sending a letter to schedule or reschedule their GXT. We will be removing the patient from the echo WQ.  PT Cancelled COVID test 06/29/19, No Showed COVID  07/06/19 and cancelled 08/10/19.  Patient cancelled GXT 07/03/19,07/10/19 and 08/14/19.  Thank you

## 2019-08-22 NOTE — Telephone Encounter (Signed)
Routed to ordering provider to make aware

## 2019-08-24 ENCOUNTER — Other Ambulatory Visit: Payer: Self-pay | Admitting: Nurse Practitioner

## 2019-08-24 DIAGNOSIS — I10 Essential (primary) hypertension: Secondary | ICD-10-CM

## 2019-08-24 MED FILL — LANTUS SOLOSTAR 100 UNITS/M: 100 | 24 days supply | Qty: 6 | Fill #0

## 2019-08-27 ENCOUNTER — Other Ambulatory Visit: Payer: Self-pay | Admitting: Cardiovascular Disease

## 2019-08-27 DIAGNOSIS — I10 Essential (primary) hypertension: Secondary | ICD-10-CM

## 2019-08-27 NOTE — Telephone Encounter (Signed)
Rx request sent to pharmacy.  

## 2019-09-20 ENCOUNTER — Other Ambulatory Visit: Payer: Self-pay

## 2019-09-20 ENCOUNTER — Ambulatory Visit
Admission: RE | Admit: 2019-09-20 | Discharge: 2019-09-20 | Disposition: A | Discharge status: Home / Self Care | Payer: Managed Care, Other (non HMO) | Source: Ambulatory Visit | Attending: Nurse Practitioner | Admitting: Nurse Practitioner

## 2019-09-20 DIAGNOSIS — Z1231 Encounter for screening mammogram for malignant neoplasm of breast: Secondary | ICD-10-CM

## 2019-09-27 ENCOUNTER — Telehealth: Payer: Self-pay | Admitting: Nurse Practitioner

## 2019-09-27 NOTE — Telephone Encounter (Signed)
Will forward to nurse 

## 2019-09-27 NOTE — Telephone Encounter (Signed)
Please advise  Copied from [place] [phone]. Topic: Appointment Scheduling - Scheduling Inquiry for Clinic >> Sep 27, 2019  9:01 AM Scherrie Gerlach wrote: Reason for CRM: pt states she was to repeat lab work in 3 weeks from her appt 6/01.  Pt wants to know if dr will put in lab order only for her to do repeat labs

## 2019-09-28 ENCOUNTER — Other Ambulatory Visit: Payer: Self-pay

## 2019-09-28 DIAGNOSIS — E1165 Type 2 diabetes mellitus with hyperglycemia: Secondary | ICD-10-CM

## 2019-09-28 NOTE — Telephone Encounter (Signed)
Attempt to reach patient to schedule a lab appt. To repeat her kidney function. No answer and LVM.  Order for labs are placed in as future.

## 2019-10-01 ENCOUNTER — Other Ambulatory Visit: Payer: Managed Care, Other (non HMO)

## 2019-10-09 ENCOUNTER — Other Ambulatory Visit: Payer: Self-pay

## 2019-10-09 ENCOUNTER — Ambulatory Visit: Payer: Managed Care, Other (non HMO) | Attending: Nurse Practitioner

## 2019-10-09 DIAGNOSIS — E1165 Type 2 diabetes mellitus with hyperglycemia: Secondary | ICD-10-CM

## 2019-10-10 LAB — CMP14+EGFR
ALT: 16 IU/L (ref 0–32)
AST: 15 IU/L (ref 0–40)
Albumin/Globulin Ratio: 1.5 (ref 1.2–2.2)
Albumin: 4.1 g/dL (ref 3.8–4.8)
Alkaline Phosphatase: 76 IU/L (ref 48–121)
BUN/Creatinine Ratio: 13 (ref 9–23)
BUN: 12 mg/dL (ref 6–24)
Bilirubin Total: <0.2 mg/dL (ref 0.0–1.2)
CO2: 18 mmol/L — ABNORMAL LOW (ref 20–29)
Calcium: 9.5 mg/dL (ref 8.7–10.2)
Chloride: 105 mmol/L (ref 96–106)
Creatinine, Ser: 0.94 mg/dL (ref 0.57–1.00)
GFR calc Af Amer: 86 mL/min/{1.73_m2} (ref >59)
GFR calc non Af Amer: 75 mL/min/{1.73_m2} (ref >59)
Globulin, Total: 2.7 g/dL (ref 1.5–4.5)
Glucose: 152 mg/dL — ABNORMAL HIGH (ref 65–99)
Potassium: 4.2 mmol/L (ref 3.5–5.2)
Sodium: 138 mmol/L (ref 134–144)
Total Protein: 6.8 g/dL (ref 6.0–8.5)

## 2019-10-29 MED FILL — LANTUS SOLOSTAR 100 UNITS/M: 100 | 24 days supply | Qty: 6 | Fill #1

## 2019-10-29 MED FILL — metFORMIN HCL ER 500 MG TB2: 500 | 30 days supply | Qty: 120 | Fill #1

## 2019-10-31 ENCOUNTER — Other Ambulatory Visit: Payer: Self-pay | Admitting: Cardiovascular Disease

## 2019-10-31 DIAGNOSIS — I10 Essential (primary) hypertension: Secondary | ICD-10-CM

## 2019-10-31 MED ORDER — PROPRANOLOL HCL ER 80 MG PO CP24: ORAL_CAPSULE | ORAL | 0 refills | Status: DC

## 2019-10-31 NOTE — Addendum Note (Signed)
Addended by: Hinton Dyer on: 10/31/2019 10:25 AM   Modules accepted: Orders

## 2019-10-31 NOTE — Telephone Encounter (Signed)
*  STAT* If patient is at the pharmacy, call can be transferred to refill team.   1. Which medications need to be refilled? (please list name of each medication and dose if known)  propranolol ER (INDERAL LA) 80 MG 24 hr capsule  2. Which pharmacy/location (including street and city if local pharmacy) is medication to be sent to? [id number] [address], [place]  3. Do they need a 30 day or 90 day supply? 90  Patient is out of medication  Patient is scheduled to see Dr. Gwenlyn Found 12/04/19

## 2019-10-31 NOTE — Telephone Encounter (Signed)
Refill sent to pharmacy.   

## 2019-11-22 ENCOUNTER — Other Ambulatory Visit: Payer: Self-pay | Admitting: Cardiovascular Disease

## 2019-11-22 ENCOUNTER — Other Ambulatory Visit: Payer: Self-pay

## 2019-11-22 ENCOUNTER — Ambulatory Visit
Admission: EM | Admit: 2019-11-22 | Discharge: 2019-11-22 | Disposition: A | Discharge status: Home / Self Care | Payer: Managed Care, Other (non HMO) | Attending: Emergency Medicine | Admitting: Emergency Medicine

## 2019-11-22 DIAGNOSIS — N390 Urinary tract infection, site not specified: Secondary | ICD-10-CM

## 2019-11-22 DIAGNOSIS — I10 Essential (primary) hypertension: Secondary | ICD-10-CM

## 2019-11-22 DIAGNOSIS — R110 Nausea: Secondary | ICD-10-CM

## 2019-11-22 LAB — POCT URINALYSIS DIP (MANUAL ENTRY)
Bilirubin, UA: NEGATIVE
Blood, UA: NEGATIVE
Glucose, UA: NEGATIVE mg/dL
Ketones, POC UA: NEGATIVE mg/dL
Nitrite, UA: POSITIVE — AB
Protein Ur, POC: NEGATIVE mg/dL
Spec Grav, UA: >=1.03 — AB (ref 1.010–1.025)
Urobilinogen, UA: 0.2 E.U./dL
pH, UA: 6.5 (ref 5.0–8.0)

## 2019-11-22 MED ORDER — NITROFURANTOIN MONOHYD MACRO 100 MG PO CAPS: 100.0000 mg | ORAL_CAPSULE | Freq: Two times a day (BID) | ORAL | 0 refills | Status: DC

## 2019-11-22 NOTE — ED Provider Notes (Signed)
Tonya Paul    CSN: [id number] Arrival date & time: 11/22/19  0855      History   Chief Complaint Chief Complaint  Patient presents with   Abdominal Pain    HPI Tonya Paul is a 44 y.o. female.   Patient presents with 2-day history of urinary frequency and dysuria.  She states her urine was malodorous and cloudy last week.  Patient also reports nausea and one episode of diarrhea yesterday.  She also reports mild lower abdominal pain.  She denies fever, chills, vomiting, rash, or other symptoms.  No treatments attempted at home.  The history is provided by the patient.    Past Medical History:  Diagnosis Date   Diabetes mellitus without complication ([place])    Hemorrhoids 11/18/2015   Hypertension    Left breast mass 09/01/2015   Long Q-T syndrome 01/14/2017   Long QT syndrome   Palpitations 02/03/2016   PVCs seen on monitor   Vertigo     Patient Active Problem List   Diagnosis Date Noted   Essential hypertension 06/14/2019   Dizziness 06/14/2019   Abdominal pain, chronic, epigastric    GERD (gastroesophageal reflux disease) 06/20/2017   Long Q-T syndrome 01/14/2017   Palpitations 02/03/2016   Uterine fibroid 12/11/2015   Family history of sudden cardiac death 12/23/15   Hemorrhoids 11/18/2015   Left breast mass 09/01/2015   Obesity 09/01/2015   Insulin dependent type 2 diabetes mellitus ([place]) 04/16/2015    Past Surgical History:  Procedure Laterality Date   BREAST BIOPSY Left 09/03/2015   [place] Core bx, benign   ESOPHAGOGASTRODUODENOSCOPY (EGD) WITH PROPOFOL N/A 06/21/2017   Procedure: ESOPHAGOGASTRODUODENOSCOPY (EGD) WITH PROPOFOL;  Surgeon: Lin Landsman, MD;  Location: [facility];  Service: Gastroenterology;  Laterality: N/A;    OB History    Gravida  3   Para  2   Term  2   Preterm      AB  1   Living  1     SAB  1   TAB      Ectopic      Multiple      Live Births  2            Home  Medications    Prior to Admission medications   Medication Sig Start Date End Date Taking? Authorizing Provider  atorvastatin (LIPITOR) 10 MG tablet Take 1 tablet (10 mg total) by mouth daily. 08/21/19 11/22/19 Yes Gildardo Pounds, NP  EPINEPHrine 0.3 mg/0.3 mL IJ SOAJ injection Inject 0.3 mLs (0.3 mg total) into the muscle as needed for anaphylaxis. 07/15/19  Yes Veronese, [place], MD  glucose blood (RELION GLUCOSE TEST STRIPS) test strip Use as instructed 07/30/16  Yes Hairston, Toy Baker R, FNP  Insulin Syringe-Needle U-100 (B-D INS SYRINGE 0.5CC/31GX5/16) 31G X 5/16" 0.5 ML MISC USE AS DIRECTED DAILY FOR INSULIN ADMINISTRATION 11/29/16  Yes Hairston, Maylon Peppers, FNP  LANTUS SOLOSTAR 100 UNIT/ML Solostar Pen INJECT 25 UNITS INTO THE SKIN AT BEDTIME. 08/24/19  Yes Gildardo Pounds, NP  metFORMIN (GLUCOPHAGE-XR) 500 MG 24 hr tablet Take 2 tablets (1,000 mg total) by mouth 2 (two) times daily. 08/21/19  Yes Gildardo Pounds, NP  propranolol ER (INDERAL LA) 80 MG 24 hr capsule TAKE 1 CAPSULE BY MOUTH ONCE A DAY 10/31/19  Yes Lorretta Harp, MD  Blood Glucose Monitoring Suppl (TRUE METRIX METER) DEVI 1 each by Does not apply route 3 (three) times daily before meals. 04/16/15   Newlin,  Charlane Ferretti, MD  butalbital-acetaminophen-caffeine (FIORICET) 50-325-40 MG tablet Take 1-2 tablets by mouth every 6 (six) hours as needed for headache. 08/21/19 08/20/20  Gildardo Pounds, NP  cetirizine (ZYRTEC ALLERGY) 10 MG tablet Take 1 tablet (10 mg total) by mouth daily for 7 days. 07/15/19 07/22/19  Rudene Re, MD  ferrous sulfate 324 (65 Fe) MG TBEC Take 1 tablet (325 mg total) by mouth 2 (two) times daily. 08/21/19 11/19/19  Gildardo Pounds, NP  fluticasone (FLONASE) 50 MCG/ACT nasal spray Place 2 sprays into both nostrils daily. 06/25/19   Jaynee Eagles, PA-C  levocetirizine (XYZAL) 5 MG tablet Take 1 tablet (5 mg total) by mouth every evening. 06/25/19   Jaynee Eagles, PA-C  nitrofurantoin, macrocrystal-monohydrate, (MACROBID) 100  MG capsule Take 1 capsule (100 mg total) by mouth 2 (two) times daily. 11/22/19   Sharion Balloon, NP  triamcinolone cream (KENALOG) 0.1 % Apply 1 application topically 2 (two) times daily. Patient not taking: Reported on 08/21/2019 07/07/18   Norval Gable, MD  TRUEPLUS PEN NEEDLES 31G X 5 MM MISC USE AS INSTRUCTED 08/18/18   Gildardo Pounds, NP    Family History Family History  Problem Relation Age of Onset   Hypertension Mother    Lung cancer Mother    Throat cancer Father    Diabetes Sister    Lupus Maternal Grandmother    Stroke Maternal Grandfather    Sudden Cardiac Death Daughter    Breast cancer Paternal Aunt     Social History Social History   Tobacco Use   Smoking status: Never Smoker   Smokeless tobacco: Never Used  Scientific laboratory technician Use: Never used  Substance Use Topics   Alcohol use: No   Drug use: No     Allergies   Ace inhibitors, Aspirin, Rocephin [ceftriaxone], Ciprofloxacin, and Prednisone   Review of Systems Review of Systems  Constitutional: Negative for chills and fever.  HENT: Negative for ear pain and sore throat.   Eyes: Negative for pain and visual disturbance.  Respiratory: Negative for cough and shortness of breath.   Cardiovascular: Negative for chest pain and palpitations.  Gastrointestinal: Positive for abdominal pain, diarrhea and nausea. Negative for vomiting.  Genitourinary: Positive for dysuria and frequency. Negative for hematuria, pelvic pain and vaginal discharge.  Musculoskeletal: Negative for arthralgias and back pain.  Skin: Negative for color change and rash.  Neurological: Negative for seizures and syncope.  All other systems reviewed and are negative.    Physical Exam Triage Vital Signs ED Triage Vitals  Enc Vitals Group     BP      Pulse      Resp      Temp      Temp src      SpO2      Weight      Height      Head Circumference      Peak Flow      Pain Score      Pain Loc      Pain Edu?       Excl. in [place]?    No data found.  Updated Vital Signs BP (!) 145/95 (BP Location: Left Arm)    Pulse 72    Temp 99 F (37.2 C) (Oral)    Resp 18    LMP 11/07/2019    SpO2 96%   Visual Acuity Right Eye Distance:   Left Eye Distance:   Bilateral Distance:    Right Eye Near:  Left Eye Near:    Bilateral Near:     Physical Exam Vitals and nursing note reviewed.  Constitutional:      General: She is not in acute distress.    Appearance: She is well-developed. She is not ill-appearing.  HENT:     Head: Normocephalic and atraumatic.     Mouth/Throat:     Mouth: Mucous membranes are moist.     Pharynx: Oropharynx is clear.  Eyes:     Conjunctiva/sclera: Conjunctivae normal.  Cardiovascular:     Rate and Rhythm: Normal rate and regular rhythm.     Heart sounds: No murmur heard.   Pulmonary:     Effort: Pulmonary effort is normal. No respiratory distress.     Breath sounds: Normal breath sounds.  Abdominal:     Palpations: Abdomen is soft.     Tenderness: There is no abdominal tenderness. There is no right CVA tenderness, left CVA tenderness, guarding or rebound.  Musculoskeletal:     Cervical back: Neck supple.  Skin:    General: Skin is warm and dry.     Findings: No rash.  Neurological:     General: No focal deficit present.     Mental Status: She is alert and oriented to person, place, and time.     Gait: Gait normal.  Psychiatric:        Mood and Affect: Mood normal.        Behavior: Behavior normal.      UC Treatments / Results  Labs (all labs ordered are listed, but only abnormal results are displayed) Labs Reviewed  POCT URINALYSIS DIP (MANUAL ENTRY) - Abnormal; Notable for the following components:      Result Value   Spec Grav, UA >=1.030 (*)    Nitrite, UA Positive (*)    Leukocytes, UA Trace (*)    All other components within normal limits  URINE CULTURE    EKG   Radiology No results found.  Procedures Procedures (including critical care  time)  Medications Ordered in UC Medications - No data to display  Initial Impression / Assessment and Plan / UC Course  I have reviewed the triage vital signs and the nursing notes.  Pertinent labs & imaging results that were available during my care of the patient were reviewed by me and considered in my medical decision making (see chart for details).   UTI.  Nausea without vomiting.  Elevated blood pressure reading with known hypertension.  Urine culture pending.  Treating with Macrobid.  Discussed with patient that we will call her if the antibiotic needs to be changed or discontinued.  Discussed with patient that her blood pressure is elevated today needs to be rechecked by her PCP in 2 to 4 weeks.  Patient agrees to plan of care.   Final Clinical Impressions(s) / UC Diagnoses   Final diagnoses:  Urinary tract infection without hematuria, site unspecified  Nausea without vomiting  Elevated blood pressure reading in office with diagnosis of hypertension     Discharge Instructions     Take the antibiotic as directed.  Urine culture is pending and we will call you if it shows the need to change or discontinue your antibiotic.  Follow up with your primary care provider if your symptoms are not improving.    Your blood pressure is elevated today at 145/95.  Please have this rechecked by your primary care provider in 2-4 weeks.         ED Prescriptions  Medication Sig Dispense Auth. Provider   nitrofurantoin, macrocrystal-monohydrate, (MACROBID) 100 MG capsule Take 1 capsule (100 mg total) by mouth 2 (two) times daily. 10 capsule Sharion Balloon, NP     PDMP not reviewed this encounter.   Sharion Balloon, NP 11/22/19 [phone]

## 2019-11-22 NOTE — ED Triage Notes (Signed)
Reports stomach feeling "bubbly" and and nauseated when she eats x 2 days.  No vomiting.  One episode of diarrhea yesterday. Reports RLQ pain during intercourse and with urination. No pain with ambulation or at rest.  Area not TTP.   Reports frequent urination x 2 days. Reports odor with urine last week and cloudy urine.  No hematuria noticed.

## 2019-11-22 NOTE — Discharge Instructions (Addendum)
Take the antibiotic as directed.  Urine culture is pending and we will call you if it shows the need to change or discontinue your antibiotic.  Follow up with your primary care provider if your symptoms are not improving.    Your blood pressure is elevated today at 145/95.  Please have this rechecked by your primary care provider in 2-4 weeks.

## 2019-11-23 ENCOUNTER — Encounter: Payer: Self-pay | Admitting: Nurse Practitioner

## 2019-11-23 ENCOUNTER — Ambulatory Visit: Payer: Managed Care, Other (non HMO) | Attending: Nurse Practitioner | Admitting: Nurse Practitioner

## 2019-11-23 DIAGNOSIS — R002 Palpitations: Secondary | ICD-10-CM | POA: Diagnosis not present

## 2019-11-23 DIAGNOSIS — I10 Essential (primary) hypertension: Secondary | ICD-10-CM | POA: Diagnosis not present

## 2019-11-23 DIAGNOSIS — R519 Headache, unspecified: Secondary | ICD-10-CM | POA: Diagnosis not present

## 2019-11-23 MED ORDER — BLOOD PRESSURE MONITOR DEVI: 0 refills | Status: DC

## 2019-11-23 NOTE — Progress Notes (Signed)
Virtual Visit via Telephone Note Due to national recommendations of social distancing due to [place] 19, telehealth visit is felt to be most appropriate for this patient at this time.  I discussed the limitations, risks, security and privacy concerns of performing an evaluation and management service by telephone and the availability of in person appointments. I also discussed with the patient that there may be a patient responsible charge related to this service. The patient expressed understanding and agreed to proceed.    I connected with [name] on 11/23/19  at   3:30 PM EDT  EDT by telephone and verified that I am speaking with the correct person using two identifiers.   Consent I discussed the limitations, risks, security and privacy concerns of performing an evaluation and management service by telephone and the availability of in person appointments. I also discussed with the patient that there may be a patient responsible charge related to this service. The patient expressed understanding and agreed to proceed.   Location of Patient: Private Residence   Location of Provider: [place] and [place] participating in Telemedicine visit: Geryl Rankins FNP-BC [place]    History of Present Illness: Telemedicine visit for: Headaches and elevated blood pressure readings.    Endorses more frequent headaches over the past month and a half. States blood pressure was elevated at a recent Urgent Care visit earlier this week. She is concerned that blood pressure may not be well controlled. Associated symptoms include worsening headaches and more frequent palpitations. She has an upcoming appt with cardiology regarding her palpitations. She does take migraine medication for her headaches which provide effective relief however she is concerned as the headaches have been increasing in occurrences. Denies chest pain, shortness of breath or BLE  edema. She currently taking propranolol ER 80 mg daily for her palpitations. This has also kept her blood pressure controlled in the past.  BP Readings from Last 3 Encounters:  11/22/19 (!) 145/95  08/21/19 127/82  07/15/19 102/79   Wt Readings from Last 3 Encounters:  08/21/19 206 lb (93.4 kg)  07/14/19 175 lb (79.4 kg)  06/14/19 185 lb (83.9 kg)    DM TYPE 2 Not quite at goal with A1c or LDL. Currently taking atorvastatin 10 mg daily, lantus 25 units in the pm, metformin XR 500 mg BID. Denies symptoms of hypo or hyperglycemia.  Lab Results  Component Value Date   HGBA1C 7.3 (A) 08/21/2019   Lab Results  Component Value Date   Tusculum 98 01/05/2019   Past Medical History:  Diagnosis Date  . Diabetes mellitus without complication ([place])   . Hemorrhoids 11/18/2015  . Hypertension   . Left breast mass 09/01/2015  . 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 Palpitations 02/03/2016   PVCs seen on monitor  . Vertigo     Past Surgical History:  Procedure Laterality Date  . BREAST BIOPSY Left 09/03/2015   [place] Core bx, benign  . ESOPHAGOGASTRODUODENOSCOPY (EGD) WITH PROPOFOL N/A 06/21/2017   Procedure: ESOPHAGOGASTRODUODENOSCOPY (EGD) WITH PROPOFOL;  Surgeon: Lin Landsman, MD;  Location: [place];  Service: Gastroenterology;  Laterality: N/A;    Family History  Problem Relation Age of Onset  . Hypertension Mother   . Lung cancer Mother   . Throat cancer Father   . Diabetes Sister   . Lupus Maternal Grandmother   . Stroke Maternal Grandfather   . Sudden Cardiac Death Daughter   .  Breast cancer Paternal Aunt     Social History   Socioeconomic History  . Marital status: Married    Spouse name: Not on file  . Number of children: Not on file  . Years of education: Not on file  . Highest education level: Not on file  Occupational History  . Not on file  Tobacco Use  . Smoking status: Never Smoker  . Smokeless tobacco: Never Used  Vaping Use  .  Vaping Use: Never used  Substance and Sexual Activity  . Alcohol use: No  . Drug use: No  . Sexual activity: Yes    Birth control/protection: None  Other Topics Concern  . Not on file  Social History Narrative  . Not on file   Social Determinants of Health   Financial Resource Strain:   . Difficulty of Paying Living Expenses: Not on file  Food Insecurity:   . Worried About Charity fundraiser in the Last Year: Not on file  . Ran Out of Food in the Last Year: Not on file  Transportation Needs:   . Lack of Transportation (Medical): Not on file  . Lack of Transportation (Non-Medical): Not on file  Physical Activity:   . Days of Exercise per Week: Not on file  . Minutes of Exercise per Session: Not on file  Stress:   . Feeling of Stress : Not on file  Social Connections:   . Frequency of Communication with Friends and Family: Not on file  . Frequency of Social Gatherings with Friends and Family: Not on file  . Attends Religious Services: Not on file  . Active Member of Clubs or Organizations: Not on file  . Attends Archivist Meetings: Not on file  . Marital Status: Not on file     Observations/Objective: Awake, alert and oriented x 3   Review of Systems  Constitutional: Negative for fever, malaise/fatigue and weight loss.  HENT: Negative.  Negative for nosebleeds.   Eyes: Negative.  Negative for blurred vision, double vision and photophobia.  Respiratory: Negative.  Negative for cough and shortness of breath.   Cardiovascular: Positive for palpitations. Negative for chest pain and leg swelling.  Gastrointestinal: Negative.  Negative for heartburn, nausea and vomiting.  Musculoskeletal: Negative.  Negative for myalgias.  Neurological: Positive for headaches. Negative for dizziness, focal weakness and seizures.  Psychiatric/Behavioral: Negative.  Negative for suicidal ideas.    Assessment and Plan: Viana was seen today for follow-up.  Diagnoses and all  orders for this visit:  Essential hypertension -     Blood Pressure Monitor DEVI; Please provide patient with insurance approved blood pressure monitor ICD10.0 I Have requested she monitor her blood pressure at home for a few weeks prior to starting any additional antihypertensives.   Persistent headaches Continue fioricet as instructed.   Palpitations Continue propranolol and f/u with cardiology as instructed     Follow Up Instructions Return in about 3 months (around 02/22/2020).     I discussed the assessment and treatment plan with the patient. The patient was provided an opportunity to ask questions and all were answered. The patient agreed with the plan and demonstrated an understanding of the instructions.   The patient was advised to call back or seek an in-person evaluation if the symptoms worsen or if the condition fails to improve as anticipated.  I provided 16 minutes of non-face-to-face time during this encounter including median intraservice time, reviewing previous notes, labs, imaging, medications and explaining diagnosis and management.  Gildardo Pounds, FNP-BC

## 2019-11-25 ENCOUNTER — Encounter: Payer: Self-pay | Admitting: Nurse Practitioner

## 2019-11-28 ENCOUNTER — Ambulatory Visit: Payer: Managed Care, Other (non HMO) | Attending: Nurse Practitioner

## 2019-11-28 ENCOUNTER — Other Ambulatory Visit: Payer: Self-pay | Admitting: Nurse Practitioner

## 2019-11-28 ENCOUNTER — Other Ambulatory Visit: Payer: Self-pay

## 2019-11-28 ENCOUNTER — Telehealth: Payer: Self-pay | Admitting: Nurse Practitioner

## 2019-11-28 DIAGNOSIS — Z1159 Encounter for screening for other viral diseases: Secondary | ICD-10-CM

## 2019-11-28 DIAGNOSIS — R931 Abnormal findings on diagnostic imaging of heart and coronary circulation: Secondary | ICD-10-CM

## 2019-11-28 DIAGNOSIS — E119 Type 2 diabetes mellitus without complications: Secondary | ICD-10-CM

## 2019-11-28 DIAGNOSIS — Z87440 Personal history of urinary (tract) infections: Secondary | ICD-10-CM | POA: Diagnosis not present

## 2019-11-28 DIAGNOSIS — Z794 Long term (current) use of insulin: Secondary | ICD-10-CM

## 2019-11-28 LAB — POCT URINALYSIS DIP (CLINITEK)
Bilirubin, UA: NEGATIVE
Blood, UA: NEGATIVE
Glucose, UA: NEGATIVE mg/dL
Nitrite, UA: POSITIVE — AB
Spec Grav, UA: 1.025 (ref 1.010–1.025)
Urobilinogen, UA: 0.2 E.U./dL
pH, UA: 6.5 (ref 5.0–8.0)

## 2019-11-28 MED ORDER — SULFAMETHOXAZOLE-TRIMETHOPRIM 800-160 MG PO TABS: 1.0000 | ORAL_TABLET | Freq: Two times a day (BID) | ORAL | 0 refills | Status: AC

## 2019-11-28 MED ORDER — SULFAMETHOXAZOLE-TRIMETHOPRIM 800-160 MG PO TABS: 1.0000 | ORAL_TABLET | Freq: Two times a day (BID) | ORAL | 0 refills | Status: DC

## 2019-11-28 MED FILL — SULFAMETHOXAZOLE-TMP DS TAB: 800-160 | 3 days supply | Qty: 6 | Fill #0

## 2019-11-28 NOTE — Telephone Encounter (Signed)
Pt. Was informed PCP sent an antibiotic.

## 2019-11-28 NOTE — Telephone Encounter (Signed)
Pt. Wanted to have her Rx Bactrim send to [facility].

## 2019-11-28 NOTE — Telephone Encounter (Signed)
Patient came into the office today and wanted to inform pcp that she had a bad reaction to listed medication and would like something else. Please follow up at your earliest convenience.  [id number] Lorina Rabon, [place]  [address] [place] [address]

## 2019-11-29 LAB — LIPID PANEL
Chol/HDL Ratio: 3.9 ratio (ref 0.0–4.4)
Cholesterol, Total: 162 mg/dL (ref 100–199)
HDL: 42 mg/dL (ref >39)
LDL Chol Calc (NIH): 100 mg/dL — ABNORMAL HIGH (ref 0–99)
Triglycerides: 112 mg/dL (ref 0–149)
VLDL Cholesterol Cal: 20 mg/dL (ref 5–40)

## 2019-11-29 LAB — HEMOGLOBIN A1C
Est. average glucose Bld gHb Est-mCnc: 189 mg/dL
Hgb A1c MFr Bld: 8.2 % — ABNORMAL HIGH (ref 4.8–5.6)

## 2019-11-29 LAB — HEPATITIS C ANTIBODY: Hep C Virus Ab: <0.1 s/co ratio (ref 0.0–0.9)

## 2019-11-30 ENCOUNTER — Encounter: Payer: Self-pay | Admitting: Nurse Practitioner

## 2019-12-01 ENCOUNTER — Other Ambulatory Visit: Payer: Self-pay | Admitting: Nurse Practitioner

## 2019-12-01 LAB — URINE CULTURE

## 2019-12-01 MED ORDER — AMOXICILLIN-POT CLAVULANATE 500-125 MG PO TABS: 1.0000 | ORAL_TABLET | Freq: Two times a day (BID) | ORAL | 0 refills | Status: AC

## 2019-12-04 ENCOUNTER — Ambulatory Visit: Payer: Managed Care, Other (non HMO) | Admitting: Cardiovascular Disease

## 2019-12-07 MED FILL — LANTUS SOLOSTAR 100 UNITS/M: 100 | 24 days supply | Qty: 6 | Fill #2

## 2019-12-13 ENCOUNTER — Encounter: Payer: Self-pay | Admitting: Emergency Medicine

## 2019-12-13 ENCOUNTER — Telehealth: Payer: Self-pay | Admitting: Student

## 2019-12-13 ENCOUNTER — Emergency Department: Payer: Managed Care, Other (non HMO)

## 2019-12-13 ENCOUNTER — Other Ambulatory Visit: Payer: Self-pay

## 2019-12-13 ENCOUNTER — Emergency Department
Admission: EM | Admit: 2019-12-13 | Discharge: 2019-12-13 | Disposition: A | Discharge status: Home / Self Care | Payer: Managed Care, Other (non HMO) | Attending: Emergency Medicine | Admitting: Emergency Medicine

## 2019-12-13 DIAGNOSIS — Z5321 Procedure and treatment not carried out due to patient leaving prior to being seen by health care provider: Secondary | ICD-10-CM | POA: Insufficient documentation

## 2019-12-13 DIAGNOSIS — R0789 Other chest pain: Secondary | ICD-10-CM | POA: Diagnosis not present

## 2019-12-13 DIAGNOSIS — M79601 Pain in right arm: Secondary | ICD-10-CM | POA: Diagnosis not present

## 2019-12-13 DIAGNOSIS — M542 Cervicalgia: Secondary | ICD-10-CM | POA: Diagnosis not present

## 2019-12-13 LAB — BASIC METABOLIC PANEL
Anion gap: 6 (ref 5–15)
BUN: 12 mg/dL (ref 6–20)
CO2: 24 mmol/L (ref 22–32)
Calcium: 9 mg/dL (ref 8.9–10.3)
Chloride: 106 mmol/L (ref 98–111)
Creatinine, Ser: 0.88 mg/dL (ref 0.44–1.00)
GFR calc Af Amer: >60 mL/min (ref >60)
GFR calc non Af Amer: >60 mL/min (ref >60)
Glucose, Bld: 124 mg/dL — ABNORMAL HIGH (ref 70–99)
Potassium: 3.7 mmol/L (ref 3.5–5.1)
Sodium: 136 mmol/L (ref 135–145)

## 2019-12-13 LAB — CBC
HCT: 36.5 % (ref 36.0–46.0)
Hemoglobin: 12.1 g/dL (ref 12.0–15.0)
MCH: 28.7 pg (ref 26.0–34.0)
MCHC: 33.2 g/dL (ref 30.0–36.0)
MCV: 86.7 fL (ref 80.0–100.0)
Platelets: 411 10*3/uL — ABNORMAL HIGH (ref 150–400)
RBC: 4.21 MIL/uL (ref 3.87–5.11)
RDW: 14.3 % (ref 11.5–15.5)
WBC: 5.2 10*3/uL (ref 4.0–10.5)
nRBC: 0 % (ref 0.0–0.2)

## 2019-12-13 LAB — POCT PREGNANCY, URINE: Preg Test, Ur: NEGATIVE

## 2019-12-13 LAB — TROPONIN I (HIGH SENSITIVITY): Troponin I (High Sensitivity): 3 ng/L (ref <18)

## 2019-12-13 NOTE — ED Triage Notes (Signed)
Has history of palpitations, but last few days frequency had increased.  Today at work at [employer] she started having chest pain upper mid chest that moved intor right neck and arm.

## 2019-12-13 NOTE — ED Triage Notes (Signed)
Pt called from [facility] to treatment room, no response 

## 2019-12-13 NOTE — ED Notes (Signed)
Pt called x's 3, no response ?

## 2019-12-13 NOTE — Telephone Encounter (Signed)
   Patient called Answering Service this morning with concerns of palpitations and dizziness. I called and spoke with patient. 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 Patient has history of palpitations but these have been worse over the last 3 months. She developed 6/10 right sides chest pain this morning that radiated to neck and arm. Pain occurred after onset of palpitations. She also developed dizziness with this but no shortness of breath or syncope. Her son came to pick her up from work. Dizziness has resolved but she still reports 4/10 chest pain and palpitations. Therefore, advised patient to be seen in closest ED since office is not open yet and unsure if we would be able to get her an appointment. Patient voiced understanding and agreed and stating son could take her to [facility].  Darreld Mclean, PA-C 12/13/2019 7:23 AM

## 2019-12-14 ENCOUNTER — Telehealth: Payer: Self-pay

## 2019-12-14 ENCOUNTER — Telehealth (INDEPENDENT_AMBULATORY_CARE_PROVIDER_SITE_OTHER): Payer: Managed Care, Other (non HMO) | Admitting: Physician Assistant

## 2019-12-14 ENCOUNTER — Telehealth: Payer: Self-pay | Admitting: Radiology

## 2019-12-14 DIAGNOSIS — R002 Palpitations: Secondary | ICD-10-CM

## 2019-12-14 DIAGNOSIS — I10 Essential (primary) hypertension: Secondary | ICD-10-CM

## 2019-12-14 DIAGNOSIS — E119 Type 2 diabetes mellitus without complications: Secondary | ICD-10-CM

## 2019-12-14 DIAGNOSIS — I4581 Long QT syndrome: Secondary | ICD-10-CM | POA: Diagnosis not present

## 2019-12-14 DIAGNOSIS — E785 Hyperlipidemia, unspecified: Secondary | ICD-10-CM

## 2019-12-14 DIAGNOSIS — R079 Chest pain, unspecified: Secondary | ICD-10-CM | POA: Diagnosis not present

## 2019-12-14 NOTE — Patient Instructions (Addendum)
Medication Instructions:  Your Physician recommend you continue on your current medication as directed.    *If you need a refill on your cardiac medications before your next appointment, please call your pharmacy*   Lab Work: None ordered   Testing/Procedures: Your physician has requested that you have an exercise tolerance test. For further information please visit [email or web address]. Please also follow instruction sheet, as given. [address]  You will need to have the coronavirus test completed prior to your procedure. This is a Drive Up Visit at [address], [place], [place] [address]. Please tell them that you are there for procedure testing. Stay in your car and someone will be with you shortly. Please make sure to have all other labs completed before this test because you will need to stay quarantined until your procedure.  Your physician has recommended that you wear an event monitor for 14 days. Event monitors are medical devices that record the heart's electrical activity. Doctors most often [place] these monitors to diagnose arrhythmias. Arrhythmias are problems with the speed or rhythm of the heartbeat. The monitor is a small, portable device. You can wear one while you do your normal daily activities. This is usually used to diagnose what is causing palpitations/syncope (passing out).  .      Follow-Up: At [facility], you and your health needs are our priority.  As part of our continuing mission to provide you with exceptional heart care, we have created designated Provider Care Teams.  These Care Teams include your primary Cardiologist (physician) and Advanced Practice Providers (APPs -  Physician Assistants and Nurse Practitioners) who all work together to provide you with the care you need, when you need it.  We recommend signing up for the patient portal called "MyChart".  Sign up information is provided on this After Visit Summary.  MyChart is used  to connect with patients for Virtual Visits (Telemedicine).  Patients are able to view lab/test results, encounter notes, upcoming appointments, etc.  Non-urgent messages can be sent to your provider as well.   To learn more about what you can do with MyChart, go to [email or web address].    Your next appointment:   1 month(s)  The format for your next appointment:   In Person  Provider:   Almyra Deforest, [facility] at [facility] [address], [place],  [address] Phone:  [phone]        You are scheduled for an Exercise Stress Test  Please arrive 15 minutes prior to your appointment time for registration and insurance purposes.  The test will take approximately 45 minutes to complete.  How to prepare for your Exercise Stress Test: Do bring a list of your current medications with you.  If not listed below, you may take your medications as normal. . Do not take propranolol (inderal, Innopran) for 24 hours prior to the test.  Bring the medication to your appointment as you may be required to take it once the test is complete. . Do wear comfortable clothes (no dresses or overalls) and walking shoes, tennis shoes preferred (no heels or open toed shoes are allowed) . Do Not wear cologne, perfume, aftershave or lotions (deodorant is allowed). . Please report to [place], [address] for your test.  If these instructions are not followed, your test will have to be rescheduled.  If you have questions or concerns about your appointment, you can call the Stress Lab  at [phone].  If you cannot keep your appointment, please provide 24 hours notification to the Stress Lab, to avoid a possible $50 charge to your account

## 2019-12-14 NOTE — Telephone Encounter (Signed)
Patient called back to discuss AVS.

## 2019-12-14 NOTE — Telephone Encounter (Signed)
Pt updated with instructions and verbalized understanding.

## 2019-12-14 NOTE — Progress Notes (Signed)
Virtual Visit via Telephone Note   This visit type was conducted due to national recommendations for restrictions regarding the COVID-19 Pandemic (e.g. social distancing) in an effort to limit this patient's exposure and mitigate transmission in our community.  Due to her co-morbid illnesses, this patient is at least at moderate risk for complications without adequate follow up.  This format is felt to be most appropriate for this patient at this time.  The patient did not have access to video technology/had technical difficulties with video requiring transitioning to audio format only (telephone).  All issues noted in this document were discussed and addressed.  No physical exam could be performed with this format.  Please refer to the patient's chart for her  consent to telehealth for [facility].    Date:  12/14/2019   ID:  Tonya Paul, DOB 15-Jun-1975, MRN [id number] The patient was identified using 2 identifiers.  Patient Location: Home Provider Location: Office/Clinic  PCP:  Gildardo Pounds, NP  Cardiologist:  Quay Burow, MD  Electrophysiologist:  Virl Axe, MD   Evaluation Performed:  Follow-Up Visit  Chief Complaint:  Follow up  History of Present Illness:    Tonya Paul is a 44 y.o. female with long QT syndrome, palpitation, PVCs, HTN, HLD and DM2.  She has a family history of sudden cardiac death.  Previous echocardiogram obtained on 12/12/2015 showed normal EF, grade 1 DD, mild to moderate TR, PA peak pressure 32 mmHg.  She also had nonischemic ETT on 02/26/2016, however during recovery, she had prolonged QT.  Dr. Caryl Comes at the time suggested she is at risk for QT syndrome.  He recommended to avoid QT prolonging agent.  Coronary calcium score in October 2020 placed the patient at [facility] for age and sex matched control.  Previous heart monitor in December 2020 was cut short to only 2 days because of allergic reaction, it did not reveal any significant  arrhythmia.  Patient was last seen by Kerin Ransom, PA-C on 06/14/2019 for follow-up.  She expressed wish to start exercising in the gym, a GXT was ordered, however later canceled.  She called our office yesterday due to palpitation dizziness.  She was sent to [facility] ED, however she left before she was seen by the ED physician.  EKG showed normal sinus rhythm with heart rate of 61 bpm, no significant ST-T wave changes.  Pregnancy test negative.  Chest x-ray normal.  Electrolyte and red blood cell count were normal as well.  Patient presents to virtual visit today.  She says in the recent weeks, she had increasing palpitation associated with chest discomfort.  She denies any significant dizziness or feeling of passing out.  Palpitation is occurring on a daily basis.  She is compliant with her propranolol ER 80 mg daily.  She did describe the fluttering sensation as a prolonged episode of irregular heartbeat lasting up to 30 minutes at a time.  I recommended a repeat heart monitor with hypoallergenic patch and repeat ETT.  Note, previous ETT earlier this year was canceled as patient missed Covid test 3 at different times.  We plan to see the patient back in 1 month for reassessment.   The patient does not have symptoms concerning for COVID-19 infection (fever, chills, cough, or new shortness of breath).    Past Medical History:  Diagnosis Date  . Diabetes mellitus without complication ([place])   . Hemorrhoids 11/18/2015  . Hypertension   . Left breast mass 09/01/2015  . Long Q-T  syndrome 01/14/2017   Long QT syndrome  . Palpitations 02/03/2016   PVCs seen on monitor  . Vertigo    Past Surgical History:  Procedure Laterality Date  . BREAST BIOPSY Left 09/03/2015   [place] Core bx, benign  . ESOPHAGOGASTRODUODENOSCOPY (EGD) WITH PROPOFOL N/A 06/21/2017   Procedure: ESOPHAGOGASTRODUODENOSCOPY (EGD) WITH PROPOFOL;  Surgeon: Lin Landsman, MD;  Location: [place];  Service: Gastroenterology;   Laterality: N/A;     Current Meds  Medication Sig  . atorvastatin (LIPITOR) 10 MG tablet Take 1 tablet (10 mg total) by mouth daily.  . Blood Glucose Monitoring Suppl (TRUE METRIX METER) DEVI 1 each by Does not apply route 3 (three) times daily before meals.  . Blood Pressure Monitor DEVI Please provide patient with insurance approved blood pressure monitor ICD10.0  . butalbital-acetaminophen-caffeine (FIORICET) 50-325-40 MG tablet Take 1-2 tablets by mouth every 6 (six) hours as needed for headache.  Marland Kitchen EPINEPHrine 0.3 mg/0.3 mL IJ SOAJ injection Inject 0.3 mLs (0.3 mg total) into the muscle as needed for anaphylaxis.  . ferrous sulfate 324 (65 Fe) MG TBEC Take 1 tablet (325 mg total) by mouth 2 (two) times daily.  Marland Kitchen glucose blood (RELION GLUCOSE TEST STRIPS) test strip Use as instructed  . Insulin Syringe-Needle U-100 (B-D INS SYRINGE 0.5CC/31GX5/16) 31G X 5/16" 0.5 ML MISC USE AS DIRECTED DAILY FOR INSULIN ADMINISTRATION  . LANTUS SOLOSTAR 100 UNIT/ML Solostar Pen INJECT 25 UNITS INTO THE SKIN AT BEDTIME.  . metFORMIN (GLUCOPHAGE) 500 MG tablet Take 1,000 mg by mouth 2 (two) times daily with a meal.  . propranolol ER (INDERAL LA) 80 MG 24 hr capsule TAKE 1 CAPSULE BY MOUTH EVERY DAY  . TRUEPLUS PEN NEEDLES 31G X 5 MM MISC USE AS INSTRUCTED  . [DISCONTINUED] metFORMIN (GLUCOPHAGE-XR) 500 MG 24 hr tablet Take 2 tablets (1,000 mg total) by mouth 2 (two) times daily. (Patient taking differently: Take 1,000 mg by mouth 2 (two) times daily. Pt. Is doing 1 tablet in the AM and 1 tablet in the PM.)     Allergies:   Ace inhibitors, Aspirin, Rocephin [ceftriaxone], Ciprofloxacin, and Prednisone   Social History   Tobacco Use  . Smoking status: Never Smoker  . Smokeless tobacco: Never Used  Vaping Use  . Vaping Use: Never used  Substance Use Topics  . Alcohol use: No  . Drug use: No     Family Hx: The patient's family history includes Breast cancer in her paternal aunt; Diabetes in her  sister; Hypertension in her mother; Lung cancer in her mother; Lupus in her maternal grandmother; Stroke in her maternal grandfather; Sudden Cardiac Death in her daughter; Throat cancer in her father.  ROS:   Please see the history of present illness.     All other systems reviewed and are negative.   Prior CV studies:   The following studies were reviewed today:  Echo 12/12/2015 Study Conclusions   - Left ventricle: The cavity size was normal. Systolic function was  normal. Wall motion was normal; there were no regional wall  motion abnormalities. Doppler parameters are consistent with  abnormal left ventricular relaxation (grade 1 diastolic  dysfunction).  - Atrial septum: No defect or patent foramen ovale was identified.  - Tricuspid valve: There was mild-moderate regurgitation.  - Pulmonary arteries: PA peak pressure: 32 mm Hg (S).    ETT 02/26/2016  Blood pressure demonstrated a normal response to exercise.     Labs/Other Tests and Data Reviewed:    EKG:  An ECG dated 12/13/2019 was personally reviewed today and demonstrated:  Normal sinus rhythm without significant ST-T wave changes.  Recent Labs: 10/09/2019: ALT 16 12/13/2019: BUN 12; Creatinine, Ser 0.88; Hemoglobin 12.1; Platelets 411; Potassium 3.7; Sodium 136   Recent Lipid Panel Lab Results  Component Value Date/Time   CHOL 162 11/28/2019 04:15 PM   TRIG 112 11/28/2019 04:15 PM   HDL 42 11/28/2019 04:15 PM   CHOLHDL 3.9 11/28/2019 04:15 PM   CHOLHDL 3.6 04/18/2015 09:36 AM   LDLCALC 100 (H) 11/28/2019 04:15 PM    Wt Readings from Last 3 Encounters:  12/13/19 185 lb (83.9 kg)  08/21/19 206 lb (93.4 kg)  07/14/19 175 lb (79.4 kg)     Objective:    Vital Signs:  LMP 12/06/2019    VITAL SIGNS:  reviewed  ASSESSMENT & PLAN:    1. Chest pain: Recommend repeat ETT.  Previous ETT was canceled as the patient missed 3 Covid test consecutively.  2. Palpitation: I recommend a repeat heart monitor  with hypoallergenic patch.  Previous heart monitor was cut short due to allergic reaction with the patches.  Continue on propranolol  3. Prolonged QT syndrome: She has a family history of sudden cardiac death.  Avoid QT prolonging drug  4. Hypertension: Continue current therapy  5. Hyperlipidemia: On Lipitor  6. DM2: Managed by primary care provider  COVID-19 Education: The signs and symptoms of COVID-19 were discussed with the patient and how to seek care for testing (follow up with PCP or arrange E-visit).  The importance of social distancing was discussed today.  Time:   Today, I have spent 13 minutes with the patient with telehealth technology discussing the above problems.     Medication Adjustments/Labs and Tests Ordered: Current medicines are reviewed at length with the patient today.  Concerns regarding medicines are outlined above.   Tests Ordered: No orders of the defined types were placed in this encounter.   Medication Changes: No orders of the defined types were placed in this encounter.   Follow Up:  In Person in 1 month(s)  Signed, Almyra Deforest, [place]  12/14/2019 8:34 AM    [facility]

## 2019-12-14 NOTE — Telephone Encounter (Signed)
Enrolled patient for a 14 day [employer] Event monitor to be mailed to patients home.  

## 2019-12-14 NOTE — Telephone Encounter (Signed)
Attempted to contact pt to go over AVS and test instructions. Left message to call back.

## 2019-12-16 NOTE — Addendum Note (Signed)
Addended byEulas Post, Trica Usery on: 12/16/2019 08:12 PM   Modules accepted: Level of Service

## 2019-12-20 ENCOUNTER — Encounter (INDEPENDENT_AMBULATORY_CARE_PROVIDER_SITE_OTHER): Payer: Managed Care, Other (non HMO)

## 2019-12-20 DIAGNOSIS — R002 Palpitations: Secondary | ICD-10-CM | POA: Diagnosis not present

## 2019-12-21 ENCOUNTER — Ambulatory Visit: Payer: Managed Care, Other (non HMO) | Admitting: Family Medicine

## 2019-12-24 ENCOUNTER — Inpatient Hospital Stay (HOSPITAL_COMMUNITY): Admission: RE | Admit: 2019-12-24 | Payer: Managed Care, Other (non HMO) | Source: Ambulatory Visit

## 2019-12-26 ENCOUNTER — Ambulatory Visit (HOSPITAL_COMMUNITY)
Admission: RE | Admit: 2019-12-26 | Payer: Managed Care, Other (non HMO) | Source: Ambulatory Visit | Attending: Physician Assistant | Admitting: Physician Assistant

## 2019-12-31 ENCOUNTER — Ambulatory Visit: Payer: Managed Care, Other (non HMO) | Attending: Nurse Practitioner | Admitting: Nurse Practitioner

## 2019-12-31 ENCOUNTER — Encounter: Payer: Self-pay | Admitting: Nurse Practitioner

## 2019-12-31 ENCOUNTER — Other Ambulatory Visit: Payer: Self-pay

## 2019-12-31 VITALS — BP 117/75 | HR 73 | Temp 97.7°F | Ht 70.0 in | Wt 195.0 lb

## 2019-12-31 DIAGNOSIS — E119 Type 2 diabetes mellitus without complications: Secondary | ICD-10-CM

## 2019-12-31 DIAGNOSIS — Z794 Long term (current) use of insulin: Secondary | ICD-10-CM | POA: Diagnosis not present

## 2019-12-31 DIAGNOSIS — D649 Anemia, unspecified: Secondary | ICD-10-CM | POA: Diagnosis not present

## 2019-12-31 DIAGNOSIS — I10 Essential (primary) hypertension: Secondary | ICD-10-CM

## 2019-12-31 LAB — GLUCOSE, POCT (MANUAL RESULT ENTRY): POC Glucose: 111 mg/dl — AB (ref 70–99)

## 2019-12-31 MED ORDER — FERROUS SULFATE 324 (65 FE) MG PO TBEC: 1.0000 | DELAYED_RELEASE_TABLET | Freq: Two times a day (BID) | ORAL | 1 refills | Status: DC

## 2019-12-31 NOTE — Progress Notes (Signed)
Assessment & Plan:  Tonya Paul was seen today for blood pressure check.  Diagnoses and all orders for this visit:  Essential hypertension Continue all antihypertensives as prescribed.  Remember to bring in your blood pressure log with you for your follow up appointment.  DASH/Mediterranean Diets are healthier choices for HTN.    Insulin dependent type 2 diabetes mellitus (HCC) -     Glucose (CBG) Continue blood sugar control as discussed in office today, low carbohydrate diet, and regular physical exercise as tolerated, 150 minutes per week (30 min each day, 5 days per week, or 50 min 3 days per week). Keep blood sugar logs with fasting goal of 90-130 mg/dl, post prandial (after you eat) less than 180.  For Hypoglycemia: BS <60 and Hyperglycemia BS >400; contact the clinic ASAP. Annual eye exams and foot exams are recommended.   Anemia, unspecified type -     ferrous sulfate 324 (65 Fe) MG TBEC; Take 1 tablet (325 mg total) by mouth 2 (two) times daily.    Patient has been counseled on age-appropriate routine health concerns for screening and prevention. These are reviewed and up-to-date. Referrals have been placed accordingly. Immunizations are up-to-date or declined.    Subjective:   Chief Complaint  Patient presents with  . Blood Pressure Check    Pt. is here for blood pressure check.    HPI Tonya Paul 44 y.o. female presents to office today for follow up.  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  She sees Endocrinologist for her IDDM  Chest pain/Palpitations Continues being followed by Cardiology. She has an upcoming Exercise Tolerance Test in a few weeks.   Essential Hypertension Well controlled. Taking propranolol ER 80 mg for blood pressure and palpitations. Currently denies chest pain,  shortness of breath, lightheadedness, dizziness, headaches or BLE edema.  BP Readings from Last 3 Encounters:  12/31/19 117/75  12/13/19 (!) 171/98  11/22/19 (!) 145/95     Review of Systems  Constitutional: Negative for fever, malaise/fatigue and weight loss.  HENT: Negative.  Negative for nosebleeds.   Eyes: Negative.  Negative for blurred vision, double vision and photophobia.  Respiratory: Negative.  Negative for cough and shortness of breath.   Cardiovascular: Positive for palpitations. Negative for chest pain and leg swelling.  Gastrointestinal: Negative.  Negative for heartburn, nausea and vomiting.  Musculoskeletal: Negative.  Negative for myalgias.  Neurological: Negative.  Negative for dizziness, focal weakness, seizures and headaches.  Psychiatric/Behavioral: Negative.  Negative for suicidal ideas.    Past Medical History:  Diagnosis Date  . Diabetes mellitus without complication ([place])   . Hemorrhoids 11/18/2015  . Hypertension   . Left breast mass 09/01/2015  . 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 Palpitations 02/03/2016   PVCs seen on monitor  . Vertigo     Past Surgical History:  Procedure Laterality Date  . BREAST BIOPSY Left 09/03/2015   [place] Core bx, benign  . ESOPHAGOGASTRODUODENOSCOPY (EGD) WITH PROPOFOL N/A 06/21/2017   Procedure: ESOPHAGOGASTRODUODENOSCOPY (EGD) WITH PROPOFOL;  Surgeon: Lin Landsman, MD;  Location: [facility];  Service: Gastroenterology;  Laterality: N/A;    Family History  Problem Relation Age of Onset  . Hypertension Mother   . Lung cancer Mother   . Throat cancer Father   . Diabetes Sister   . Lupus Maternal Grandmother   . Stroke Maternal Grandfather   .  Sudden Cardiac Death Daughter   . Breast cancer Paternal Aunt     Social History Reviewed with no changes to be made today.   Outpatient Medications Prior to Visit  Medication Sig Dispense Refill  . Blood Glucose Monitoring Suppl (TRUE METRIX METER)  DEVI 1 each by Does not apply route 3 (three) times daily before meals. 1 Device 0  . Blood Pressure Monitor DEVI Please provide patient with insurance approved blood pressure monitor ICD10.0 1 each 0  . butalbital-acetaminophen-caffeine (FIORICET) 50-325-40 MG tablet Take 1-2 tablets by mouth every 6 (six) hours as needed for headache. 20 tablet 0  . EPINEPHrine 0.3 mg/0.3 mL IJ SOAJ injection Inject 0.3 mLs (0.3 mg total) into the muscle as needed for anaphylaxis. 1 each 0  . glucose blood (RELION GLUCOSE TEST STRIPS) test strip Use as instructed 100 each 12  . Insulin Syringe-Needle U-100 (B-D INS SYRINGE 0.5CC/31GX5/16) 31G X 5/16" 0.5 ML MISC USE AS DIRECTED DAILY FOR INSULIN ADMINISTRATION 100 each 1  . LANTUS SOLOSTAR 100 UNIT/ML Solostar Pen INJECT 25 UNITS INTO THE SKIN AT BEDTIME. 9 mL 5  . metFORMIN (GLUCOPHAGE) 500 MG tablet Take 1,000 mg by mouth 2 (two) times daily with a meal.    . propranolol ER (INDERAL LA) 80 MG 24 hr capsule TAKE 1 CAPSULE BY MOUTH EVERY DAY 30 capsule 7  . TRUEPLUS PEN NEEDLES 31G X 5 MM MISC USE AS INSTRUCTED 100 each 1  . atorvastatin (LIPITOR) 10 MG tablet Take 1 tablet (10 mg total) by mouth daily. 90 tablet 3  . ferrous sulfate 324 (65 Fe) MG TBEC Take 1 tablet (325 mg total) by mouth 2 (two) times daily. 180 tablet 1   No facility-administered medications prior to visit.    Allergies  Allergen Reactions  . Ace Inhibitors Shortness Of Breath    Reported by the patient after a trial of lisinopril prescribes by PCP  . Aspirin Anaphylaxis  . Rocephin [Ceftriaxone] Itching  . Ciprofloxacin Itching  . Prednisone Palpitations       Objective:    BP 117/75 (BP Location: Left Arm, Patient Position: Sitting, Cuff Size: Normal)   Pulse 73   Temp 97.7 F (36.5 C) (Temporal)   Ht 5\' 10"  (1.778 m)   Wt 195 lb (88.5 kg)   LMP 12/06/2019   SpO2 99%   BMI 27.98 kg/m  Wt Readings from Last 3 Encounters:  12/31/19 195 lb (88.5 kg)  12/13/19 185 lb  (83.9 kg)  08/21/19 206 lb (93.4 kg)    Physical Exam Vitals and nursing note reviewed.  Constitutional:      Appearance: She is well-developed.  HENT:     Head: Normocephalic and atraumatic.  Cardiovascular:     Rate and Rhythm: Normal rate and regular rhythm.     Heart sounds: Normal heart sounds. No murmur heard.  No friction rub. No gallop.   Pulmonary:     Effort: Pulmonary effort is normal. No tachypnea or respiratory distress.     Breath sounds: Normal breath sounds. No decreased breath sounds, wheezing, rhonchi or rales.  Chest:     Chest wall: No tenderness.  Abdominal:     General: Bowel sounds are normal.     Palpations: Abdomen is soft.  Musculoskeletal:        General: Normal range of motion.     Cervical back: Normal range of motion.  Skin:    General: Skin is warm and dry.  Neurological:  Mental Status: She is alert and oriented to person, place, and time.     Coordination: Coordination normal.  Psychiatric:        Behavior: Behavior normal. Behavior is cooperative.        Thought Content: Thought content normal.        Judgment: Judgment normal.          Patient has been counseled extensively about nutrition and exercise as well as the importance of adherence with medications and regular follow-up. The patient was given clear instructions to go to ER or return to medical center if symptoms don't improve, worsen or new problems develop. The patient verbalized understanding.   Follow-up: Return in about 3 months (around 04/01/2020).   Gildardo Pounds, FNP-BC [facility] and [facility] [place], [place]   12/31/2019, 4:55 PM

## 2020-01-04 ENCOUNTER — Telehealth: Payer: Self-pay | Admitting: *Deleted

## 2020-01-04 ENCOUNTER — Encounter: Payer: Self-pay | Admitting: Cardiovascular Disease

## 2020-01-04 ENCOUNTER — Other Ambulatory Visit: Payer: Self-pay

## 2020-01-04 ENCOUNTER — Ambulatory Visit (INDEPENDENT_AMBULATORY_CARE_PROVIDER_SITE_OTHER): Payer: Managed Care, Other (non HMO) | Admitting: Cardiovascular Disease

## 2020-01-04 DIAGNOSIS — I4581 Long QT syndrome: Secondary | ICD-10-CM

## 2020-01-04 DIAGNOSIS — R002 Palpitations: Secondary | ICD-10-CM

## 2020-01-04 DIAGNOSIS — R931 Abnormal findings on diagnostic imaging of heart and coronary circulation: Secondary | ICD-10-CM | POA: Insufficient documentation

## 2020-01-04 DIAGNOSIS — I10 Essential (primary) hypertension: Secondary | ICD-10-CM

## 2020-01-04 DIAGNOSIS — E785 Hyperlipidemia, unspecified: Secondary | ICD-10-CM | POA: Insufficient documentation

## 2020-01-04 DIAGNOSIS — E782 Mixed hyperlipidemia: Secondary | ICD-10-CM

## 2020-01-04 MED ORDER — METOPROLOL TARTRATE 100 MG PO TABS: ORAL_TABLET | ORAL | 0 refills | Status: DC

## 2020-01-04 MED ORDER — LISINOPRIL 5 MG PO TABS: 5.0000 mg | ORAL_TABLET | Freq: Every day | ORAL | 3 refills | Status: DC

## 2020-01-04 MED ORDER — METOPROLOL TARTRATE 50 MG PO TABS: 50.0000 mg | ORAL_TABLET | Freq: Once | ORAL | 0 refills | Status: DC

## 2020-01-04 MED ORDER — ATORVASTATIN CALCIUM 20 MG PO TABS: 20.0000 mg | ORAL_TABLET | Freq: Every day | ORAL | 3 refills | Status: DC

## 2020-01-04 NOTE — Telephone Encounter (Signed)
Spoke to the patient. She had a office visit today with Dr. Gwenlyn Found where a cardiac ct was ordered. The patient is concerned that she may have an allergic reaction to the dye. Per Dr. Gwenlyn Found, the Cardiac CT can be cancelled and the patient should keep her appointment for the GXT on 10/26. The patient has been notified.

## 2020-01-04 NOTE — Assessment & Plan Note (Signed)
History of essential hypertension a blood pressure measured today at 128/96.  She is on propranolol at home and says her blood pressure runs even higher than this.  She is a diabetic but has normal renal function.  I am going to begin her on low-dose lisinopril 5 mg a day we will check a basic metabolic panel in 2 weeks.  She will keep a blood pressure log for 3 days and see a Pharm.D. back in 4 weeks to review make appropriate changes.

## 2020-01-04 NOTE — Addendum Note (Signed)
Addended by: Ricci Barker on: 01/04/2020 04:48 PM   Modules accepted: Orders

## 2020-01-04 NOTE — Progress Notes (Signed)
01/04/2020 [name]   [birth year]  [id number]  Primary Physician Tonya Pounds, NP Primary Cardiologist: Tonya Harp MD FACP, Tonya Paul, Tonya Paul, [place]  HPI:  Tonya Paul is a 44 y.o.  moderately overweight African-American female,mother of one62year-old son, Tonya Paul,whowasself-referred for evaluation of inheritable causes of sudden cardiac death.  Her son Tonya Paul she was recently discharged in the Tonya Paul age 22 for medical reasons.  There is a question according to the patient that he had a "myocardial infarction".  I last saw her in the office 01/05/2019. Unfortunately, her 46 year old daughter died suddenly 11/30/2012 of unclear reasons. Her fourth cousin did as well. Her only risk factor for heart disease as type 2 diabetes. She's never had a heart attack and stroke. She does get occasional shortness of breath but denies chest pain. She's had palpitations that occur daily over the last several months. She does not drink caffeine or alcohol. A 2-D echo was performed that showed normal LV function with mild to moderate TR. I referred her to Tonya Paul to evaluate her for inherited causes of sudden death such as "Channelopathies ". A routine GXTwas performed by Tonya Paul and this was entirely normal other than exercise-induced prolongation of her QT interval somewhat suggestive of long QT syndrome. Her sonTyruswas gently tested at Tonya Paul and didn't not carry the gene. Shehascomplainedof occasional atypical chest pain which sounds musculoskeletal.  Since I saw her in the office a year ago she has done well.  She recently finished wearing an event monitor that was ordered by Tonya Paul.  He also is planning to do an exercise GXT.  She gets occasional palpitations.  She did have an episode of chest pain with radiation to her neck and arm.  I ordered a coronary calcium score on her 01/19/2019 which was 47.  Current Meds  Medication Sig  . Blood Glucose Monitoring Suppl (TRUE  METRIX METER) DEVI 1 each by Does not apply route 3 (three) times daily before meals.  . Blood Pressure Monitor DEVI Please provide patient with insurance approved blood pressure monitor ICD10.0  . butalbital-acetaminophen-caffeine (FIORICET) 50-325-40 MG tablet Take 1-2 tablets by mouth every 6 (six) hours as needed for headache.  Marland Kitchen EPINEPHrine 0.3 mg/0.3 mL IJ SOAJ injection Inject 0.3 mLs (0.3 mg total) into the muscle as needed for anaphylaxis.  . ferrous sulfate 324 (65 Fe) MG TBEC Take 1 tablet (325 mg total) by mouth 2 (two) times daily.  Marland Kitchen glucose blood (RELION GLUCOSE TEST STRIPS) test strip Use as instructed  . Insulin Syringe-Needle U-100 (B-D INS SYRINGE 0.5CC/31GX5/16) 31G X 5/16" 0.5 ML MISC USE AS DIRECTED DAILY FOR INSULIN ADMINISTRATION  . LANTUS SOLOSTAR 100 UNIT/ML Solostar Pen INJECT 25 UNITS INTO THE SKIN AT BEDTIME.  . metFORMIN (GLUCOPHAGE) 500 MG tablet Take 1,000 mg by mouth 2 (two) times daily with a meal.  . propranolol ER (INDERAL LA) 80 MG 24 hr capsule TAKE 1 CAPSULE BY MOUTH EVERY DAY  . TRUEPLUS PEN NEEDLES 31G X 5 MM MISC USE AS INSTRUCTED     Allergies  Allergen Reactions  . Ace Inhibitors Shortness Of Breath    Reported by the patient after a trial of lisinopril prescribes by PCP  . Aspirin Anaphylaxis  . Rocephin [Ceftriaxone] Itching  . Ciprofloxacin Itching  . Prednisone Palpitations    Social History   Socioeconomic History  . Marital status: Married    Spouse name: Not on file  . Number of children: Not on  file  . Years of education: Not on file  . Highest education level: Not on file  Occupational History  . Not on file  Tobacco Use  . Smoking status: Never Smoker  . Smokeless tobacco: Never Used  Vaping Use  . Vaping Use: Never used  Substance and Sexual Activity  . Alcohol use: No  . Drug use: No  . Sexual activity: Yes    Birth control/protection: None  Other Topics Concern  . Not on file  Social History Narrative  . Not on  file   Social Determinants of Health   Financial Resource Strain:   . Difficulty of Paying Living Expenses: Not on file  Food Insecurity:   . Worried About Charity fundraiser in the Last Year: Not on file  . Ran Out of Food in the Last Year: Not on file  Transportation Needs:   . Lack of Transportation (Medical): Not on file  . Lack of Transportation (Non-Medical): Not on file  Physical Activity:   . Days of Exercise per Week: Not on file  . Minutes of Exercise per Session: Not on file  Stress:   . Feeling of Stress : Not on file  Social Connections:   . Frequency of Communication with Friends and Family: Not on file  . Frequency of Social Gatherings with Friends and Family: Not on file  . Attends Religious Services: Not on file  . Active Member of Clubs or Organizations: Not on file  . Attends Archivist Meetings: Not on file  . Marital Status: Not on file  Intimate Partner Violence:   . Fear of Current or Ex-Partner: Not on file  . Emotionally Abused: Not on file  . Physically Abused: Not on file  . Sexually Abused: Not on file     Review of Systems: General: negative for chills, fever, night sweats or weight changes.  Cardiovascular: negative for chest pain, dyspnea on exertion, edema, orthopnea, palpitations, paroxysmal nocturnal dyspnea or shortness of breath Dermatological: negative for rash Respiratory: negative for cough or wheezing Urologic: negative for hematuria Abdominal: negative for nausea, vomiting, diarrhea, bright red blood per rectum, melena, or hematemesis Neurologic: negative for visual changes, syncope, or dizziness All other systems reviewed and are otherwise negative except as noted above.    Blood pressure (!) 128/96, pulse 73, height 5\' 10"  (1.778 m), weight 199 lb (90.3 kg), last menstrual period 12/06/2019.  General appearance: alert and no distress Neck: no adenopathy, no carotid bruit, no JVD, supple, symmetrical, trachea midline  and thyroid not enlarged, symmetric, no tenderness/mass/nodules Lungs: clear to auscultation bilaterally Heart: regular rate and rhythm, S1, S2 normal, no murmur, click, rub or gallop Extremities: extremities normal, atraumatic, no cyanosis or edema Pulses: 2+ and symmetric Skin: Skin color, texture, turgor normal. No rashes or lesions Neurologic: Alert and oriented X 3, normal strength and tone. Normal symmetric reflexes. Normal coordination and gait  EKG not performed today  ASSESSMENT AND PLAN:   Palpitations Time he does complain of occasional palpitations.  She had an event monitor a year ago that showed PVCs and just wear another 1 that was ordered by Tonya Paul.  She is on propranolol for this.  Long Q-T syndrome History of long QT syndrome with prolonged QT during exercise and family history of sudden cardiac death although her son was genetically tested and did not have the gene.  She sees Tonya Paul for this.  Essential hypertension History of essential hypertension a blood pressure measured today  at 128/96.  She is on propranolol at home and says her blood pressure runs even higher than this.  She is a diabetic but has normal renal function.  I am going to begin her on low-dose lisinopril 5 mg a day we will check a basic metabolic panel in 2 weeks.  She will keep a blood pressure log for 3 days and see a Pharm.D. back in 4 weeks to review make appropriate changes.  Elevated coronary artery calcium score Coronary calcium score performed 01/19/2019 was 47.  She did have an episode of chest pain rating to her neck and arm.  Given this and her risk factors I am going to get a coronary CTA to further evaluate  Hyperlipidemia History of mild hyperlipidemia on atorvastatin 10 mg a day.  Her most recent lipid profile performed 11/28/2019 revealed total cholesterol 162, LDL 100 and HDL 42.  I am going to increase her atorvastatin from 10 to 20 mg a day and we will recheck a lipid liver profile  in 2 months      Tonya Harp MD [facility], [facility] 01/04/2020 4:28 PM

## 2020-01-04 NOTE — Addendum Note (Signed)
Addended by: Ricci Barker on: 01/04/2020 04:37 PM   Modules accepted: Orders

## 2020-01-04 NOTE — Assessment & Plan Note (Signed)
History of mild hyperlipidemia on atorvastatin 10 mg a day.  Her most recent lipid profile performed 11/28/2019 revealed total cholesterol 162, LDL 100 and HDL 42.  I am going to increase her atorvastatin from 10 to 20 mg a day and we will recheck a lipid liver profile in 2 months

## 2020-01-04 NOTE — Assessment & Plan Note (Signed)
Coronary calcium score performed 01/19/2019 was 47.  She did have an episode of chest pain rating to her neck and arm.  Given this and her risk factors I am going to get a coronary CTA to further evaluate

## 2020-01-04 NOTE — Assessment & Plan Note (Signed)
History of long QT syndrome with prolonged QT during exercise and family history of sudden cardiac death although her son was genetically tested and did not have the gene.  She sees Dr. Caryl Comes for this.

## 2020-01-04 NOTE — Patient Instructions (Addendum)
Medication Instructions:  START Lisinopril 5 mg once daily START Atorvastatin 20 mg once daily  *If you need a refill on your cardiac medications before your next appointment, please call your pharmacy*   Lab Work: Your provider would like for you to return in 2 months to have the following labs drawn: fasting lipid and liver. You do not need an appointment for the lab. Once in our office lobby there is a podium where you can sign in and ring the doorbell to alert [place] that you are here. The lab is open from 8:00 am to 4:30 pm; closed for lunch from 12:45pm-1:45pm.  If you have labs (blood work) drawn today and your tests are completely normal, you will receive your results only by: Marland Kitchen MyChart Message (if you have MyChart) OR . A paper copy in the mail If you have any lab test that is abnormal or we need to change your treatment, we will call you to review the results.   Follow-Up: At [facility], you and your health needs are our priority.  As part of our continuing mission to provide you with exceptional heart care, we have created designated Provider Care Teams.  These Care Teams include your primary Cardiologist (physician) and Advanced Practice Providers (APPs -  Physician Assistants and Nurse Practitioners) who all work together to provide you with the care you need, when you need it.  We recommend signing up for the patient portal called "MyChart".  Sign up information is provided on this After Visit Summary.  MyChart is used to connect with patients for Virtual Visits (Telemedicine).  Patients are able to view lab/test results, encounter notes, upcoming appointments, etc.  Non-urgent messages can be sent to your provider as well.   To learn more about what you can do with MyChart, go to [email or web address].    Your next appointment:   1 month(s)  The format for your next appointment:   In Person  Provider:   Pharmd  Please keep a daily log of your blood pressures one to two  hours after taking your medications  Other Instructions Your cardiac CT will be scheduled at one of the below locations:   [facility] [address] [place], [place] [address] [phone] [address] [place], [place] [address] [phone]  If scheduled at [facility], please arrive at the [facility] main entrance of [place] 30 minutes prior to test start time. Proceed to the [facility] Radiology Department (first floor) to check-in and test prep.  If scheduled at [facility], please arrive 15 mins early for check-in and test prep.  Please follow these instructions carefully (unless otherwise directed):   On the Night Before the Test: . Be sure to Drink plenty of water. . Do not consume any caffeinated/decaffeinated beverages or chocolate 12 hours prior to your test. . Do not take any antihistamines 12 hours prior to your test. . If the patient has contrast allergy:  On the Day of the Test: . Drink plenty of water. Do not drink any water within one hour of the test. . Do not eat any food 4 hours prior to the test. . You may take your regular medications prior to the test.  . Take metoprolol (Lopressor) two hours prior to test. . FEMALES- please wear underwire-free bra if available      After the Test: . Drink plenty of water. Marland Kitchen  After receiving IV contrast, you may experience a mild flushed feeling. This is normal. . On occasion, you may experience a mild rash up to 24 hours after the test. This is not dangerous. If this occurs, you can take Benadryl 25 mg and increase your fluid intake. . If you experience trouble breathing, this can be serious. If it is severe call 911 IMMEDIATELY. If it is mild, please call our office. . If you take any of these medications: Glipizide/Metformin, Avandament, Glucavance, please do not take 48 hours after completing  test unless otherwise instructed.   Once we have confirmed authorization from your insurance company, we will call you to set up a date and time for your test. Based on how quickly your insurance processes prior authorizations requests, please allow up to 4 weeks to be contacted for scheduling your Cardiac CT appointment. Be advised that routine Cardiac CT appointments could be scheduled as many as 8 weeks after your provider has ordered it.  For non-scheduling related questions, please contact the cardiac imaging nurse navigator should you have any questions/concerns: Marchia Bond, Cardiac Imaging Nurse Navigator Burley Saver, Interim Cardiac Imaging Nurse [place] [facility] Direct Office Dial: [phone]   For scheduling needs, including cancellations and rescheduling, please call Vivien Rota at [phone], option 3.

## 2020-01-04 NOTE — Assessment & Plan Note (Signed)
Time he does complain of occasional palpitations.  She had an event monitor a year ago that showed PVCs and just wear another 1 that was ordered by Dr. Caryl Comes.  She is on propranolol for this.

## 2020-01-08 MED FILL — LANTUS SOLOSTAR 100 UNITS/M: 100 | 24 days supply | Qty: 6 | Fill #3

## 2020-01-09 ENCOUNTER — Encounter (INDEPENDENT_AMBULATORY_CARE_PROVIDER_SITE_OTHER): Payer: Managed Care, Other (non HMO) | Admitting: Ophthalmology

## 2020-01-11 ENCOUNTER — Telehealth (HOSPITAL_COMMUNITY): Payer: Self-pay | Admitting: *Deleted

## 2020-01-11 NOTE — Telephone Encounter (Signed)
Close encounter 

## 2020-01-12 ENCOUNTER — Inpatient Hospital Stay (HOSPITAL_COMMUNITY): Admission: RE | Admit: 2020-01-12 | Payer: Managed Care, Other (non HMO) | Source: Ambulatory Visit

## 2020-01-15 ENCOUNTER — Inpatient Hospital Stay (HOSPITAL_COMMUNITY): Admission: RE | Admit: 2020-01-15 | Payer: Managed Care, Other (non HMO) | Source: Ambulatory Visit

## 2020-01-15 ENCOUNTER — Telehealth (HOSPITAL_COMMUNITY): Payer: Self-pay | Admitting: Physician Assistant

## 2020-01-15 MED FILL — LANTUS SOLOSTAR 100 UNITS/M: 100 | 24 days supply | Qty: 6 | Fill #3

## 2020-01-15 NOTE — Telephone Encounter (Signed)
Just an FYI. We have made several attempts to contact this patient including sending a letter to schedule or reschedule their GXT/COVID . We will be removing the patient from the echo WQ.   Patient has now [place] screening x 5 and which has cancelled GXT on the following dates:   08/14/19 12/26/19 01/15/20  This patient will not be called to reschedule due to Multiple No Shows.     Thank you

## 2020-02-04 ENCOUNTER — Ambulatory Visit: Payer: Managed Care, Other (non HMO)

## 2020-02-21 MED FILL — LANTUS SOLOSTAR 100 UNITS/M: 100 | 24 days supply | Qty: 6 | Fill #4

## 2020-02-26 ENCOUNTER — Emergency Department
Admission: EM | Admit: 2020-02-26 | Discharge: 2020-02-26 | Disposition: A | Discharge status: Home / Self Care | Payer: Managed Care, Other (non HMO) | Attending: Emergency Medicine | Admitting: Emergency Medicine

## 2020-02-26 ENCOUNTER — Encounter (INDEPENDENT_AMBULATORY_CARE_PROVIDER_SITE_OTHER): Payer: Managed Care, Other (non HMO) | Admitting: Ophthalmology

## 2020-02-26 ENCOUNTER — Other Ambulatory Visit: Payer: Self-pay

## 2020-02-26 DIAGNOSIS — Z20822 Contact with and (suspected) exposure to covid-19: Secondary | ICD-10-CM | POA: Diagnosis not present

## 2020-02-26 DIAGNOSIS — Z5321 Procedure and treatment not carried out due to patient leaving prior to being seen by health care provider: Secondary | ICD-10-CM | POA: Insufficient documentation

## 2020-02-26 DIAGNOSIS — R1110 Vomiting, unspecified: Secondary | ICD-10-CM | POA: Diagnosis not present

## 2020-02-26 DIAGNOSIS — R0981 Nasal congestion: Secondary | ICD-10-CM | POA: Diagnosis not present

## 2020-02-26 DIAGNOSIS — J029 Acute pharyngitis, unspecified: Secondary | ICD-10-CM | POA: Diagnosis not present

## 2020-02-26 LAB — COMPREHENSIVE METABOLIC PANEL
ALT: 23 U/L (ref 0–44)
AST: 18 U/L (ref 15–41)
Albumin: 4.1 g/dL (ref 3.5–5.0)
Alkaline Phosphatase: 61 U/L (ref 38–126)
Anion gap: 10 (ref 5–15)
BUN: 12 mg/dL (ref 6–20)
CO2: 19 mmol/L — ABNORMAL LOW (ref 22–32)
Calcium: 9.1 mg/dL (ref 8.9–10.3)
Chloride: 106 mmol/L (ref 98–111)
Creatinine, Ser: 0.74 mg/dL (ref 0.44–1.00)
GFR, Estimated: >60 mL/min (ref >60)
Glucose, Bld: 258 mg/dL — ABNORMAL HIGH (ref 70–99)
Potassium: 3.4 mmol/L — ABNORMAL LOW (ref 3.5–5.1)
Sodium: 135 mmol/L (ref 135–145)
Total Bilirubin: 0.7 mg/dL (ref 0.3–1.2)
Total Protein: 7.8 g/dL (ref 6.5–8.1)

## 2020-02-26 LAB — URINALYSIS, COMPLETE (UACMP) WITH MICROSCOPIC
Bilirubin Urine: NEGATIVE
Glucose, UA: NEGATIVE mg/dL
Hgb urine dipstick: NEGATIVE
Ketones, ur: NEGATIVE mg/dL
Leukocytes,Ua: NEGATIVE
Nitrite: POSITIVE — AB
Protein, ur: NEGATIVE mg/dL
Specific Gravity, Urine: 1.01 (ref 1.005–1.030)
pH: 5 (ref 5.0–8.0)

## 2020-02-26 LAB — CBC
HCT: 36.3 % (ref 36.0–46.0)
Hemoglobin: 12.3 g/dL (ref 12.0–15.0)
MCH: 28.3 pg (ref 26.0–34.0)
MCHC: 33.9 g/dL (ref 30.0–36.0)
MCV: 83.6 fL (ref 80.0–100.0)
Platelets: 381 10*3/uL (ref 150–400)
RBC: 4.34 MIL/uL (ref 3.87–5.11)
RDW: 13.6 % (ref 11.5–15.5)
WBC: 8.9 10*3/uL (ref 4.0–10.5)
nRBC: 0 % (ref 0.0–0.2)

## 2020-02-26 LAB — RESP PANEL BY RT-PCR (FLU A&B, COVID) ARPGX2
Influenza A by PCR: NEGATIVE
Influenza B by PCR: NEGATIVE
SARS Coronavirus 2 by RT PCR: NEGATIVE

## 2020-02-26 LAB — POC URINE PREG, ED: Preg Test, Ur: NEGATIVE

## 2020-02-26 LAB — LIPASE, BLOOD: Lipase: 34 U/L (ref 11–51)

## 2020-02-26 NOTE — ED Notes (Signed)
No answer when called for vitals, no answer when called for room x3.

## 2020-02-26 NOTE — ED Triage Notes (Signed)
Pt in with co sore throat, congestion and vomiting x 4 since yesterday.

## 2020-02-26 NOTE — ED Notes (Signed)
Unable to locate patient for repeat vital signs.

## 2020-03-01 ENCOUNTER — Other Ambulatory Visit: Payer: Self-pay

## 2020-03-01 ENCOUNTER — Encounter: Payer: Self-pay | Admitting: Emergency Medicine

## 2020-03-01 ENCOUNTER — Ambulatory Visit
Admission: EM | Admit: 2020-03-01 | Discharge: 2020-03-01 | Disposition: A | Discharge status: Home / Self Care | Payer: Managed Care, Other (non HMO) | Attending: Physician Assistant | Admitting: Physician Assistant

## 2020-03-01 DIAGNOSIS — N3001 Acute cystitis with hematuria: Secondary | ICD-10-CM | POA: Diagnosis present

## 2020-03-01 DIAGNOSIS — R350 Frequency of micturition: Secondary | ICD-10-CM | POA: Diagnosis not present

## 2020-03-01 LAB — URINALYSIS, COMPLETE (UACMP) WITH MICROSCOPIC
Bilirubin Urine: NEGATIVE
Glucose, UA: NEGATIVE mg/dL
Leukocytes,Ua: NEGATIVE
Nitrite: POSITIVE — AB
RBC / HPF: >50 RBC/hpf (ref 0–5)
Specific Gravity, Urine: 1.025 (ref 1.005–1.030)
pH: 6 (ref 5.0–8.0)

## 2020-03-01 MED ORDER — NITROFURANTOIN MONOHYD MACRO 100 MG PO CAPS: 100.0000 mg | ORAL_CAPSULE | Freq: Two times a day (BID) | ORAL | 0 refills | Status: AC

## 2020-03-01 NOTE — ED Triage Notes (Signed)
Patient c/o urinary frequency for a week.  Patient states that she was seen at the ED on 02/26/20 and had a urine test done and showed UTI but she left before being treated do the wait.

## 2020-03-01 NOTE — Discharge Instructions (Addendum)
Recommend start Macrobid 100mg  twice a day as directed. Continue to increase fluids. Encouraged to urinate frequently and consume more water to help with UTI prevention. Follow-up pending urine culture results.

## 2020-03-01 NOTE — ED Provider Notes (Signed)
[facility]    CSN: [id number] Arrival date & time: 03/01/20  1033      History   Chief Complaint Chief Complaint  Patient presents with   Urinary Frequency    HPI Tonya Paul is a 44 y.o. female.   44 year old female presents with urinary frequency for over 1 week. Denies any fever, dysuria, hematuria, low back pain or unusual vaginal discharge. Went to [facility] ER 4 days ago (12/7) to be worked up for possible UTI. Left after triage (after 13 hours waiting) and UA results showed possible UTI- did not get treated. Has history of recurrent UTI- asking about prevention. Last UTI about 3 months ago. Other chronic health issues include type 2 DM, HTN, and hyperlipidemia. Currently on Propranolol, Glucophage, Lantus and Lipitor daily.   The history is provided by the patient.    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   Palpitations 02/03/2016   Uterine fibroid 12/11/2015   Family history of sudden cardiac death 12-07-2015   Hemorrhoids 11/18/2015   Left breast mass 09/01/2015   Obesity 09/01/2015   Insulin dependent type 2 diabetes mellitus ([place]) 04/16/2015    Past Surgical History:  Procedure Laterality Date   BREAST BIOPSY Left 09/03/2015   [place] Core bx, benign   ESOPHAGOGASTRODUODENOSCOPY (EGD) WITH PROPOFOL N/A 06/21/2017   Procedure: ESOPHAGOGASTRODUODENOSCOPY (EGD) WITH PROPOFOL;  Surgeon:  Lin Landsman, MD;  Location: [place];  Service: Gastroenterology;  Laterality: N/A;    OB History    Gravida  3   Para  2   Term  2   Preterm      AB  1   Living  1     SAB  1   IAB      Ectopic      Multiple      Live Births  2            Home Medications    Prior to Admission medications   Medication Sig Start Date End Date Taking? Authorizing Provider  atorvastatin (LIPITOR) 20 MG tablet Take 1 tablet (20 mg total) by mouth daily. 01/04/20 04/03/20 Yes Lorretta Harp, MD  butalbital-acetaminophen-caffeine (FIORICET) [phone] MG tablet Take 1-2 tablets by mouth every 6 (six) hours as needed for headache. 08/21/19 08/20/20 Yes Gildardo Pounds, NP  ferrous sulfate 324 (65 Fe) MG TBEC Take 1 tablet (325 mg total) by mouth 2 (two) times daily. 12/31/19 03/30/20 Yes Gildardo Pounds, NP  LANTUS SOLOSTAR 100 UNIT/ML Solostar Pen INJECT 25 UNITS INTO THE SKIN AT BEDTIME. 08/24/19  Yes Gildardo Pounds, NP  metFORMIN (GLUCOPHAGE) 500 MG tablet Take 1,000 mg by mouth 2 (two) times daily with a meal.   Yes [provider]  propranolol ER (INDERAL LA) 80 MG 24 hr capsule TAKE 1 CAPSULE BY MOUTH EVERY DAY 11/23/19  Yes Lorretta Harp, MD  lisinopril (ZESTRIL) 5 MG  tablet Take 1 tablet (5 mg total) by mouth daily. 01/04/20 03/01/20 Yes Lorretta Harp, MD  Blood Glucose Monitoring Suppl (TRUE METRIX METER) DEVI 1 each by Does not apply route 3 (three) times daily before meals. 04/16/15   Charlott Rakes, MD  Blood Pressure Monitor DEVI Please provide patient with insurance approved blood pressure monitor ICD10.0 11/23/19   Gildardo Pounds, NP  EPINEPHrine 0.3 mg/0.3 mL IJ SOAJ injection Inject 0.3 mLs (0.3 mg total) into the muscle as needed for anaphylaxis. 07/15/19   Alfred Levins, [place], MD  glucose blood (RELION GLUCOSE TEST STRIPS) test strip Use as instructed 07/30/16   Alfonse Spruce, FNP  Insulin Syringe-Needle U-100 (B-D INS SYRINGE 0.5CC/31GX5/16)  31G X 5/16" 0.5 ML MISC USE AS DIRECTED DAILY FOR INSULIN ADMINISTRATION 11/29/16   Alfonse Spruce, FNP  nitrofurantoin, macrocrystal-monohydrate, (MACROBID) 100 MG capsule Take 1 capsule (100 mg total) by mouth 2 (two) times daily for 5 days. 03/01/20 03/06/20  Katy Apo, NP  TRUEPLUS PEN NEEDLES 31G X 5 MM MISC USE AS INSTRUCTED 08/18/18   Gildardo Pounds, NP    Family History Family History  Problem Relation Age of Onset   Hypertension Mother    Lung cancer Mother    Throat cancer Father    Diabetes Sister    Lupus Maternal Grandmother    Stroke Maternal Grandfather    Sudden Cardiac Death Daughter    Breast cancer Paternal Aunt     Social History Social History   Tobacco Use   Smoking status: Never Smoker   Smokeless tobacco: Never Used  Scientific laboratory technician Use: Never used  Substance Use Topics   Alcohol use: No   Drug use: No     Allergies   Ace inhibitors, Aspirin, Rocephin [ceftriaxone], Ciprofloxacin, and Prednisone   Review of Systems Review of Systems  Constitutional: Negative for activity change, appetite change, chills, fatigue and fever.  Respiratory: Negative for cough, chest tightness, shortness of breath and wheezing.   Cardiovascular: Negative for chest pain and palpitations.  Gastrointestinal: Negative for abdominal pain, nausea and vomiting.  Genitourinary: Positive for frequency. Negative for difficulty urinating, dysuria, flank pain, genital sores, hematuria, pelvic pain and vaginal discharge. Vaginal bleeding: currently on period.  Musculoskeletal: Negative for arthralgias, back pain and myalgias.  Skin: Negative for color change and rash.  Allergic/Immunologic: Negative for environmental allergies and food allergies.  Neurological: Negative for tremors, seizures, syncope, weakness, light-headedness and headaches.  Hematological: Negative for adenopathy. Does not bruise/bleed easily.     Physical Exam Triage Vital  Signs ED Triage Vitals  Enc Vitals Group     BP 03/01/20 1059 140/85     Pulse Rate 03/01/20 1059 75     Resp 03/01/20 1059 14     Temp 03/01/20 1059 98.3 F (36.8 C)     Temp Source 03/01/20 1059 Oral     SpO2 03/01/20 1059 99 %     Weight 03/01/20 1055 180 lb (81.6 kg)     Height 03/01/20 1055 5\' 10"  (1.778 m)     Head Circumference --      Peak Flow --      Pain Score 03/01/20 1055 0     Pain Loc --      Pain Edu? --      Excl. in [place]? --    No data found.  Updated Vital Signs BP 140/85 (BP Location: Left Arm)    Pulse 75    Temp  98.3 F (36.8 C) (Oral)    Resp 14    Ht 5\' 10"  (1.778 m)    Wt 180 lb (81.6 kg)    LMP 02/26/2020 (Exact Date)    SpO2 99%    BMI 25.83 kg/m   Visual Acuity Right Eye Distance:   Left Eye Distance:   Bilateral Distance:    Right Eye Near:   Left Eye Near:    Bilateral Near:     Physical Exam Vitals and nursing note reviewed.  Constitutional:      General: She is awake. She is not in acute distress.    Appearance: She is well-developed and well-groomed. She is not ill-appearing.     Comments: She is sitting comfortably on the exam table in no acute distress.   HENT:     Head: Normocephalic and atraumatic.  Cardiovascular:     Rate and Rhythm: Normal rate and regular rhythm.     Heart sounds: Normal heart sounds. No murmur heard.   Pulmonary:     Effort: Pulmonary effort is normal. No respiratory distress.     Breath sounds: Normal breath sounds and air entry. No decreased air movement. No decreased breath sounds, wheezing, rhonchi or rales.  Abdominal:     General: Abdomen is flat. Bowel sounds are normal. There is no distension.     Palpations: Abdomen is soft. There is no mass.     Tenderness: There is no abdominal tenderness. There is no right CVA tenderness, left CVA tenderness, guarding or rebound.  Musculoskeletal:        General: Normal range of motion.  Skin:    General: Skin is warm and dry.  Neurological:     General:  No focal deficit present.     Mental Status: She is alert and oriented to person, place, and time.  Psychiatric:        Mood and Affect: Mood normal.        Behavior: Behavior normal. Behavior is cooperative.        Thought Content: Thought content normal.        Judgment: Judgment normal.      UC Treatments / Results  Labs (all labs ordered are listed, but only abnormal results are displayed) Labs Reviewed  URINALYSIS, COMPLETE (UACMP) WITH MICROSCOPIC - Abnormal; Notable for the following components:      Result Value   APPearance HAZY (*)    Hgb urine dipstick LARGE (*)    Ketones, ur TRACE (*)    Protein, ur TRACE (*)    Nitrite POSITIVE (*)    Bacteria, UA MANY (*)    All other components within normal limits  URINE CULTURE    EKG   Radiology No results found.  Procedures Procedures (including critical care time)  Medications Ordered in UC Medications - No data to display  Initial Impression / Assessment and Plan / UC Course  I have reviewed the triage vital signs and the nursing notes.  Pertinent labs & imaging results that were available during my care of the patient were reviewed by me and considered in my medical decision making (see chart for details).     Reviewed urinalysis results with patient- positive nitrites, blood, protein, ketones, and many bacteria- probable UTI. Due to allergies to multiple antibiotics, will start Macrobid 100mg  twice a day as directed. Urine sent for culture. Discussed that diabetes places her at risk for more recurrent UTI. Encouraged to urinate more frequently and consume more water and keep  her blood sugars under good control. Follow-up pending urine culture results.  Final Clinical Impressions(s) / UC Diagnoses   Final diagnoses:  Acute cystitis with hematuria  Urinary frequency     Discharge Instructions     Recommend start Macrobid 100mg  twice a day as directed. Continue to increase fluids. Encouraged to urinate  frequently and consume more water to help with UTI prevention. Follow-up pending urine culture results.     ED Prescriptions    Medication Sig Dispense Auth. Provider   nitrofurantoin, macrocrystal-monohydrate, (MACROBID) 100 MG capsule Take 1 capsule (100 mg total) by mouth 2 (two) times daily for 5 days. 10 capsule Zurie Platas, Nicholes Stairs, NP     PDMP not reviewed this encounter.   Katy Apo, NP 03/02/20 [phone]

## 2020-03-04 LAB — URINE CULTURE: Culture: >=100000 — AB

## 2020-03-26 MED FILL — metFORMIN HCL ER 500 MG TB2: 500 | 30 days supply | Qty: 120 | Fill #2

## 2020-03-26 MED FILL — !LANTUS SOLOSTAR 100UNITS/M: 100 | 24 days supply | Qty: 6 | Fill #5

## 2020-04-09 ENCOUNTER — Ambulatory Visit: Payer: Self-pay | Admitting: Podiatry

## 2020-04-18 ENCOUNTER — Other Ambulatory Visit: Payer: Medicaid Other

## 2020-04-18 DIAGNOSIS — Z20822 Contact with and (suspected) exposure to covid-19: Secondary | ICD-10-CM

## 2020-04-20 LAB — SARS-COV-2, NAA 2 DAY TAT

## 2020-04-20 LAB — NOVEL CORONAVIRUS, NAA: SARS-CoV-2, NAA: NOT DETECTED

## 2020-04-22 ENCOUNTER — Ambulatory Visit: Payer: Self-pay | Admitting: *Deleted

## 2020-04-22 ENCOUNTER — Emergency Department: Payer: HRSA Program

## 2020-04-22 ENCOUNTER — Emergency Department
Admission: EM | Admit: 2020-04-22 | Discharge: 2020-04-22 | Disposition: A | Discharge status: Home / Self Care | Payer: HRSA Program | Attending: Emergency Medicine | Admitting: Emergency Medicine

## 2020-04-22 ENCOUNTER — Other Ambulatory Visit: Payer: Self-pay

## 2020-04-22 DIAGNOSIS — I10 Essential (primary) hypertension: Secondary | ICD-10-CM | POA: Insufficient documentation

## 2020-04-22 DIAGNOSIS — R079 Chest pain, unspecified: Secondary | ICD-10-CM | POA: Diagnosis present

## 2020-04-22 DIAGNOSIS — R531 Weakness: Secondary | ICD-10-CM | POA: Insufficient documentation

## 2020-04-22 DIAGNOSIS — Z7984 Long term (current) use of oral hypoglycemic drugs: Secondary | ICD-10-CM | POA: Diagnosis not present

## 2020-04-22 DIAGNOSIS — R5383 Other fatigue: Secondary | ICD-10-CM | POA: Insufficient documentation

## 2020-04-22 DIAGNOSIS — Z794 Long term (current) use of insulin: Secondary | ICD-10-CM | POA: Diagnosis not present

## 2020-04-22 DIAGNOSIS — E119 Type 2 diabetes mellitus without complications: Secondary | ICD-10-CM | POA: Diagnosis not present

## 2020-04-22 DIAGNOSIS — Z79899 Other long term (current) drug therapy: Secondary | ICD-10-CM | POA: Diagnosis not present

## 2020-04-22 DIAGNOSIS — U071 COVID-19: Secondary | ICD-10-CM | POA: Insufficient documentation

## 2020-04-22 DIAGNOSIS — R519 Headache, unspecified: Secondary | ICD-10-CM | POA: Diagnosis not present

## 2020-04-22 LAB — BASIC METABOLIC PANEL
Anion gap: 12 (ref 5–15)
BUN: 10 mg/dL (ref 6–20)
CO2: 22 mmol/L (ref 22–32)
Calcium: 9.4 mg/dL (ref 8.9–10.3)
Chloride: 102 mmol/L (ref 98–111)
Creatinine, Ser: 0.81 mg/dL (ref 0.44–1.00)
GFR, Estimated: >60 mL/min (ref >60)
Glucose, Bld: 151 mg/dL — ABNORMAL HIGH (ref 70–99)
Potassium: 3.6 mmol/L (ref 3.5–5.1)
Sodium: 136 mmol/L (ref 135–145)

## 2020-04-22 LAB — CBC
HCT: 33.9 % — ABNORMAL LOW (ref 36.0–46.0)
Hemoglobin: 11.1 g/dL — ABNORMAL LOW (ref 12.0–15.0)
MCH: 27.8 pg (ref 26.0–34.0)
MCHC: 32.7 g/dL (ref 30.0–36.0)
MCV: 84.8 fL (ref 80.0–100.0)
Platelets: 354 10*3/uL (ref 150–400)
RBC: 4 MIL/uL (ref 3.87–5.11)
RDW: 13.9 % (ref 11.5–15.5)
WBC: 6.8 10*3/uL (ref 4.0–10.5)
nRBC: 0 % (ref 0.0–0.2)

## 2020-04-22 LAB — CBG MONITORING, ED: Glucose-Capillary: 142 mg/dL — ABNORMAL HIGH (ref 70–99)

## 2020-04-22 LAB — TROPONIN I (HIGH SENSITIVITY)
Troponin I (High Sensitivity): <2 ng/L (ref <18)
Troponin I (High Sensitivity): <2 ng/L (ref <18)

## 2020-04-22 MED ORDER — ACETAMINOPHEN 325 MG PO TABS: 650.0000 mg | ORAL_TABLET | Freq: Once | ORAL | Status: AC

## 2020-04-22 MED ORDER — ACETAMINOPHEN 325 MG PO TABS
ORAL_TABLET | ORAL | Status: AC
  Administered 2020-04-22: 650 mg via ORAL
  Filled 2020-04-22: qty 2

## 2020-04-22 NOTE — Telephone Encounter (Signed)
  Pt called in.   Her husband is positive for covid but her test came back negative on Friday.   Starting yesterday she started having symptoms of covid and feels bad today. She is c/o chest pain.   She has an underlying heart condition and Type II diabetes.   She was wondering if she should go to the ED.   I let her know yes she needed to go. She was agreeable and is going now to the ED  I sent my notes to [facility] for [employer] information.   Reason for Disposition . [1] Chest pain (or "angina") comes and goes AND [2] is happening more often (increasing in frequency) or getting worse (increasing in severity) (Exception: chest pains that last only a few seconds)    Also covid positive  Answer Assessment - Initial Assessment Questions 1. LOCATION: "Where does it hurt?"       Pt tested negative for covid on Friday but is now having symptoms including chest pain.  She has an underlying heart condition and has Type II diabetes.   She wanted to know if she should go to the ED.   I let her know yes with her risk factors plus her husband is positive for covid and now she is having symptoms she does need to go to the ED. 2. RADIATION: "Does the pain go anywhere else?" (e.g., into neck, jaw, arms, back)     It's a general pain all in my chest. 3. ONSET: "When did the chest pain begin?" (Minutes, hours or days)      Yesterday 4. PATTERN "Does the pain come and go, or has it been constant since it started?"  "Does it get worse with exertion?"      Constant 5. DURATION: "How long does it last" (e.g., seconds, minutes, hours)     *No Answer* 6. SEVERITY: "How bad is the pain?"  (e.g., Scale 1-10; mild, moderate, or severe)    - MILD (1-3): doesn't interfere with normal activities     - MODERATE (4-7): interferes with normal activities or awakens from sleep    - SEVERE (8-10): excruciating pain, unable to do any normal activities       Mild but it's there  7.  CARDIAC RISK FACTORS: "Do you have any history of heart problems or risk factors for heart disease?" (e.g., angina, prior heart attack; diabetes, high blood pressure, high cholesterol, smoker, or strong family history of heart disease)     Yes 8. PULMONARY RISK FACTORS: "Do you have any history of lung disease?"  (e.g., blood clots in lung, asthma, emphysema, birth control pills)     No but I do have Type II diabetes 9. CAUSE: "What do you think is causing the chest pain?"     I think it's covid but I do have an underlying heart condition too 10. OTHER SYMPTOMS: "Do you have any other symptoms?" (e.g., dizziness, nausea, vomiting, sweating, fever, difficulty breathing, cough)       A little shortness of breath, body aches 11. PREGNANCY: "Is there any chance you are pregnant?" "When was your last menstrual period?"       Not asked  Protocols used: CHEST PAIN-A-AH

## 2020-04-22 NOTE — ED Triage Notes (Signed)
Pt reports mid CP that started sometime last night. 4/10 pain. States "it feels like a pressure like a gas bubble, it's not unbearable." Pt states she does have hx acid reflux but this doesn't feel the same. Pt denies shob.

## 2020-04-22 NOTE — Discharge Instructions (Addendum)
You have been tested for Covid.  Please follow-up on mychart for results.

## 2020-04-22 NOTE — ED Provider Notes (Signed)
[facility] Emergency Department Provider Note  Time seen: 10:38 PM  I have reviewed the triage vital signs and the nursing notes.   HISTORY  Chief Complaint Chest Pain   HPI Tonya Paul is a 45 y.o. female with a past medical history of diabetes, hypertension, presents to the emergency department for chest pain and fatigue.  According to the patient last week her boyfriend tested positive for Covid.  Patient states she got tested on Friday and she was negative however over the weekend she has begun feeling fatigued with intermittent headaches and for the past 2 days has been experiencing a mild chest discomfort as well.  Patient was concerned so she came to the emergency department for evaluation.  Denies any chest pain currently.  Does state a moderate headache.  Denies any fever at any point.  No vomiting or diarrhea.   Past Medical History:  Diagnosis Date  . Diabetes mellitus without complication ([place])   . Hemorrhoids 11/18/2015  . Hypertension   . Left breast mass 09/01/2015  . 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 Palpitations 02/03/2016   PVCs seen on monitor  . Vertigo     Patient Active Problem List   Diagnosis Date Noted  . Elevated coronary artery calcium score 01/04/2020  . Hyperlipidemia 01/04/2020  . Essential hypertension 06/14/2019  . Dizziness 06/14/2019  . Abdominal pain, chronic, epigastric   . GERD (gastroesophageal reflux disease) 06/20/2017  . Long Q-T syndrome 01/14/2017  . Palpitations 02/03/2016  . Uterine fibroid 12/11/2015  . Family history of sudden cardiac death 2015/12/04  . Hemorrhoids 11/18/2015  . Left breast mass 09/01/2015  . Obesity 09/01/2015  . Insulin dependent type 2 diabetes mellitus ([place]) 04/16/2015    Past Surgical History:  Procedure Laterality Date  . BREAST BIOPSY Left 09/03/2015   [place] Core bx, benign  . ESOPHAGOGASTRODUODENOSCOPY (EGD) WITH PROPOFOL N/A 06/21/2017   Procedure:  ESOPHAGOGASTRODUODENOSCOPY (EGD) WITH PROPOFOL;  Surgeon: Lin Landsman, MD;  Location: [place];  Service: Gastroenterology;  Laterality: N/A;    Prior to Admission medications   Medication Sig Start Date End Date Taking? Authorizing Provider  atorvastatin (LIPITOR) 20 MG tablet Take 1 tablet (20 mg total) by mouth daily. 01/04/20 04/03/20  Lorretta Harp, MD  Blood Glucose Monitoring Suppl (TRUE METRIX METER) DEVI 1 each by Does not apply route 3 (three) times daily before meals. 04/16/15   Charlott Rakes, MD  Blood Pressure Monitor DEVI Please provide patient with insurance approved blood pressure monitor ICD10.0 11/23/19   Gildardo Pounds, NP  butalbital-acetaminophen-caffeine (FIORICET) 50-325-40 MG tablet Take 1-2 tablets by mouth every 6 (six) hours as needed for headache. 08/21/19 08/20/20  Gildardo Pounds, NP  EPINEPHrine 0.3 mg/0.3 mL IJ SOAJ injection Inject 0.3 mLs (0.3 mg total) into the muscle as needed for anaphylaxis. 07/15/19   Rudene Re, MD  ferrous sulfate 324 (65 Fe) MG TBEC Take 1 tablet (325 mg total) by mouth 2 (two) times daily. 12/31/19 03/30/20  Gildardo Pounds, NP  glucose blood (RELION GLUCOSE TEST STRIPS) test strip Use as instructed 07/30/16   Alfonse Spruce, FNP  Insulin Syringe-Needle U-100 (B-D INS SYRINGE 0.5CC/31GX5/16) 31G X 5/16" 0.5 ML MISC USE AS DIRECTED DAILY FOR INSULIN ADMINISTRATION 11/29/16   Alfonse Spruce, FNP  LANTUS SOLOSTAR 100 UNIT/ML Solostar Pen INJECT 25 UNITS INTO THE SKIN AT BEDTIME. 08/24/19   Gildardo Pounds, NP  metFORMIN (GLUCOPHAGE) 500 MG tablet Take  1,000 mg by mouth 2 (two) times daily with a meal.    [provider]  propranolol ER (INDERAL LA) 80 MG 24 hr capsule TAKE 1 CAPSULE BY MOUTH EVERY DAY 11/23/19   Lorretta Harp, MD  TRUEPLUS PEN NEEDLES 31G X 5 MM MISC USE AS INSTRUCTED 08/18/18   Gildardo Pounds, NP  lisinopril (ZESTRIL) 5 MG tablet Take 1 tablet (5 mg total) by mouth daily. 01/04/20  03/01/20  Lorretta Harp, MD    Allergies  Allergen Reactions  . Ace Inhibitors Shortness Of Breath    Reported by the patient after a trial of lisinopril prescribes by PCP  . Aspirin Anaphylaxis  . Rocephin [Ceftriaxone] Itching  . Ciprofloxacin Itching  . Prednisone Palpitations    Family History  Problem Relation Age of Onset  . Hypertension Mother   . Lung cancer Mother   . Throat cancer Father   . Diabetes Sister   . Lupus Maternal Grandmother   . Stroke Maternal Grandfather   . Sudden Cardiac Death Daughter   . Breast cancer Paternal Aunt     Social History Social History   Tobacco Use  . Smoking status: Never Smoker  . Smokeless tobacco: Never Used  Vaping Use  . Vaping Use: Never used  Substance Use Topics  . Alcohol use: No  . Drug use: No    Review of Systems Constitutional: Negative for fever.  Positive for generalized weakness. Cardiovascular: Mild chest discomfort x2 days Respiratory: Negative for shortness of breath. Gastrointestinal: Negative for abdominal pain, vomiting and diarrhea. Musculoskeletal: Negative for musculoskeletal complaints Neurological: Intermittent headaches All other ROS negative  ____________________________________________   PHYSICAL EXAM:  VITAL SIGNS: ED Triage Vitals  Enc Vitals Group     BP 04/22/20 1923 131/82     Pulse Rate 04/22/20 1923 89     Resp 04/22/20 1923 20     Temp 04/22/20 1923 98.7 F (37.1 C)     Temp Source 04/22/20 1923 Oral     SpO2 04/22/20 1923 99 %     Weight 04/22/20 1924 182 lb (82.6 kg)     Height 04/22/20 1924 5\' 10"  (1.778 m)     Head Circumference --      Peak Flow --      Pain Score 04/22/20 1928 4     Pain Loc --      Pain Edu? --      Excl. in [place]? --    Constitutional: Alert and oriented. Well appearing and in no distress. Eyes: Normal exam ENT      Head: Normocephalic and atraumatic.      Mouth/Throat: Mucous membranes are moist. Cardiovascular: Normal rate, regular  rhythm.  Respiratory: Normal respiratory effort without tachypnea nor retractions. Breath sounds are clear Gastrointestinal: Soft and nontender. No distention. Musculoskeletal: Nontender with normal range of motion in all extremities.  Neurologic:  Normal speech and language. No gross focal neurologic deficits  Skin:  Skin is warm, dry and intact.  Psychiatric: Mood and affect are normal.  ____________________________________________    EKG  EKG viewed and interpreted by myself shows a normal sinus rhythm 85 bpm with a narrow QRS, normal axis, normal intervals, no concerning ST changes.  ____________________________________________    RADIOLOGY  Chest x-ray is negative  ____________________________________________   INITIAL IMPRESSION / ASSESSMENT AND PLAN / ED COURSE  Pertinent labs & imaging results that were available during my care of the patient were reviewed by me and considered in my  medical decision making (see chart for details).   Patient presents emergency department with concerns of generalized fatigue weakness no chest pain for the past 2 days intermittent denies any currently.  Patient's work-up is reassuring clued negative troponin x2.  Remainder the lab work is normal.  We will retest for Covid in the emergency department.  Six-hour PCR test.  Malala Trenkamp was evaluated in Emergency Department on 04/22/2020 for the symptoms described in the history of present illness. She was evaluated in the context of the global COVID-19 pandemic, which necessitated consideration that the patient might be at risk for infection with the SARS-CoV-2 virus that causes COVID-19. Institutional protocols and algorithms that pertain to the evaluation of patients at risk for COVID-19 are in a state of rapid change based on information released by regulatory bodies including the CDC and federal and state organizations. These policies and algorithms were followed during the patient's care in the  ED.  ____________________________________________   FINAL CLINICAL IMPRESSION(S) / ED DIAGNOSES  Chest pain   Harvest Dark, MD 04/22/20 2331

## 2020-04-22 NOTE — ED Notes (Signed)
Pt states she has had headache since getting here but didn't mention in triage. 5/10 pain.

## 2020-04-23 ENCOUNTER — Emergency Department
Admission: EM | Admit: 2020-04-23 | Discharge: 2020-04-23 | Disposition: A | Discharge status: Home / Self Care | Payer: Medicaid Other | Attending: Emergency Medicine | Admitting: Emergency Medicine

## 2020-04-23 ENCOUNTER — Other Ambulatory Visit: Payer: Self-pay

## 2020-04-23 DIAGNOSIS — I10 Essential (primary) hypertension: Secondary | ICD-10-CM | POA: Insufficient documentation

## 2020-04-23 DIAGNOSIS — Z20822 Contact with and (suspected) exposure to covid-19: Secondary | ICD-10-CM | POA: Insufficient documentation

## 2020-04-23 DIAGNOSIS — R509 Fever, unspecified: Secondary | ICD-10-CM

## 2020-04-23 DIAGNOSIS — J111 Influenza due to unidentified influenza virus with other respiratory manifestations: Secondary | ICD-10-CM | POA: Insufficient documentation

## 2020-04-23 DIAGNOSIS — Z7984 Long term (current) use of oral hypoglycemic drugs: Secondary | ICD-10-CM | POA: Insufficient documentation

## 2020-04-23 DIAGNOSIS — Z794 Long term (current) use of insulin: Secondary | ICD-10-CM | POA: Insufficient documentation

## 2020-04-23 DIAGNOSIS — E119 Type 2 diabetes mellitus without complications: Secondary | ICD-10-CM | POA: Insufficient documentation

## 2020-04-23 DIAGNOSIS — Z79899 Other long term (current) drug therapy: Secondary | ICD-10-CM | POA: Insufficient documentation

## 2020-04-23 DIAGNOSIS — R519 Headache, unspecified: Secondary | ICD-10-CM

## 2020-04-23 LAB — SARS CORONAVIRUS 2 (TAT 6-24 HRS): SARS Coronavirus 2: POSITIVE — AB

## 2020-04-23 MED ORDER — METOCLOPRAMIDE HCL 10 MG PO TABS: 10.0000 mg | ORAL_TABLET | Freq: Four times a day (QID) | ORAL | 0 refills | Status: DC | PRN

## 2020-04-23 MED ORDER — DIPHENHYDRAMINE HCL 25 MG PO CAPS
50.0000 mg | ORAL_CAPSULE | Freq: Once | ORAL | Status: AC
  Administered 2020-04-23: 50 mg via ORAL
  Filled 2020-04-23: qty 2

## 2020-04-23 MED ORDER — ACETAMINOPHEN 500 MG PO TABS
1000.0000 mg | ORAL_TABLET | Freq: Once | ORAL | Status: AC
  Administered 2020-04-23: 1000 mg via ORAL
  Filled 2020-04-23: qty 2

## 2020-04-23 MED ORDER — METOCLOPRAMIDE HCL 10 MG PO TABS
10.0000 mg | ORAL_TABLET | Freq: Once | ORAL | Status: AC
  Administered 2020-04-23: 10 mg via ORAL
  Filled 2020-04-23: qty 1

## 2020-04-23 NOTE — ED Notes (Signed)
Lights dimmed, call bell in reach, ginger ale and warm blankets provided.

## 2020-04-23 NOTE — ED Provider Notes (Signed)
[facility] Emergency Department Provider Note  ____________________________________________  Time seen: Approximately 3:58 AM  I have reviewed the triage vital signs and the nursing notes.   HISTORY  Chief Complaint Headache    HPI Tonya Paul is a 45 y.o. female with a past history of diabetes, hypertension and recent Covid exposure who comes the ED complaining of headache.  Patient was seen in the emergency department tonight at 11 PM, had reassuring vital signs and exam and work-up and was discharged.  Since leaving a few hours ago, she went home and took 400 mg of ibuprofen.  However, the bilateral frontal headache is making her too uncomfortable to sleep.  No vision changes paresthesias or weakness, no new vomiting.  Headache has been gradual onset and constant for the last 24 hours.      Past Medical History:  Diagnosis Date  . Diabetes mellitus without complication ([place])   . Hemorrhoids 11/18/2015  . Hypertension   . Left breast mass 09/01/2015  . 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 Palpitations 02/03/2016   PVCs seen on monitor  . Vertigo      Patient Active Problem List   Diagnosis Date Noted  . Elevated coronary artery calcium score 01/04/2020  . Hyperlipidemia 01/04/2020  . Essential hypertension 06/14/2019  . Dizziness 06/14/2019  . Abdominal pain, chronic, epigastric   . GERD (gastroesophageal reflux disease) 06/20/2017  . Long Q-T syndrome 01/14/2017  . Palpitations 02/03/2016  . Uterine fibroid 12/11/2015  . Family history of sudden cardiac death 12/16/2015  . Hemorrhoids 11/18/2015  . Left breast mass 09/01/2015  . Obesity 09/01/2015  . Insulin dependent type 2 diabetes mellitus ([place]) 04/16/2015     Past Surgical History:  Procedure Laterality Date  . BREAST BIOPSY Left 09/03/2015   [place] Core bx, benign  . ESOPHAGOGASTRODUODENOSCOPY (EGD) WITH PROPOFOL N/A 06/21/2017   Procedure: ESOPHAGOGASTRODUODENOSCOPY  (EGD) WITH PROPOFOL;  Surgeon: Lin Landsman, MD;  Location: [place];  Service: Gastroenterology;  Laterality: N/A;     Prior to Admission medications   Medication Sig Start Date End Date Taking? Authorizing Provider  atorvastatin (LIPITOR) 20 MG tablet Take 1 tablet (20 mg total) by mouth daily. 01/04/20 04/03/20  Lorretta Harp, MD  Blood Glucose Monitoring Suppl (TRUE METRIX METER) DEVI 1 each by Does not apply route 3 (three) times daily before meals. 04/16/15   Charlott Rakes, MD  Blood Pressure Monitor DEVI Please provide patient with insurance approved blood pressure monitor ICD10.0 11/23/19   Gildardo Pounds, NP  butalbital-acetaminophen-caffeine (FIORICET) 50-325-40 MG tablet Take 1-2 tablets by mouth every 6 (six) hours as needed for headache. 08/21/19 08/20/20  Gildardo Pounds, NP  EPINEPHrine 0.3 mg/0.3 mL IJ SOAJ injection Inject 0.3 mLs (0.3 mg total) into the muscle as needed for anaphylaxis. 07/15/19   Rudene Re, MD  ferrous sulfate 324 (65 Fe) MG TBEC Take 1 tablet (325 mg total) by mouth 2 (two) times daily. 12/31/19 03/30/20  Gildardo Pounds, NP  glucose blood (RELION GLUCOSE TEST STRIPS) test strip Use as instructed 07/30/16   Alfonse Spruce, FNP  Insulin Syringe-Needle U-100 (B-D INS SYRINGE 0.5CC/31GX5/16) 31G X 5/16" 0.5 ML MISC USE AS DIRECTED DAILY FOR INSULIN ADMINISTRATION 11/29/16   Alfonse Spruce, FNP  LANTUS SOLOSTAR 100 UNIT/ML Solostar Pen INJECT 25 UNITS INTO THE SKIN AT BEDTIME. 08/24/19   Gildardo Pounds, NP  metFORMIN (GLUCOPHAGE) 500 MG tablet Take 1,000 mg by mouth 2 (  two) times daily with a meal.    [provider]  propranolol ER (INDERAL LA) 80 MG 24 hr capsule TAKE 1 CAPSULE BY MOUTH EVERY DAY 11/23/19   Lorretta Harp, MD  TRUEPLUS PEN NEEDLES 31G X 5 MM MISC USE AS INSTRUCTED 08/18/18   Gildardo Pounds, NP  lisinopril (ZESTRIL) 5 MG tablet Take 1 tablet (5 mg total) by mouth daily. 01/04/20 03/01/20  Lorretta Harp,  MD     Allergies Ace inhibitors, Aspirin, Rocephin [ceftriaxone], Ciprofloxacin, and Prednisone   Family History  Problem Relation Age of Onset  . Hypertension Mother   . Lung cancer Mother   . Throat cancer Father   . Diabetes Sister   . Lupus Maternal Grandmother   . Stroke Maternal Grandfather   . Sudden Cardiac Death Daughter   . Breast cancer Paternal Aunt     Social History Social History   Tobacco Use  . Smoking status: Never Smoker  . Smokeless tobacco: Never Used  Vaping Use  . Vaping Use: Never used  Substance Use Topics  . Alcohol use: No  . Drug use: No    Review of Systems  Constitutional:   Positive fever positive chills.  ENT:   No sore throat. No rhinorrhea. Cardiovascular:   No chest pain or syncope. Respiratory:   No dyspnea or cough. Gastrointestinal:   Negative for abdominal pain, vomiting and diarrhea.  Musculoskeletal:   Negative for focal pain or swelling All other systems reviewed and are negative except as documented above in ROS and HPI.  ____________________________________________   PHYSICAL EXAM:  VITAL SIGNS: ED Triage Vitals  Enc Vitals Group     BP 04/23/20 0336 (!) 143/89     Pulse Rate 04/23/20 0336 (!) 110     Resp 04/23/20 0336 18     Temp 04/23/20 0336 (!) 100.8 F (38.2 C)     Temp Source 04/23/20 0336 Oral     SpO2 04/23/20 0336 97 %     Weight 04/23/20 0336 182 lb (82.6 kg)     Height 04/23/20 0336 5\' 10"  (1.778 m)     Head Circumference --      Peak Flow --      Pain Score 04/23/20 0334 10     Pain Loc --      Pain Edu? --      Excl. in [place]? --     Vital signs reviewed, nursing assessments reviewed.   Constitutional:   Alert and oriented. Non-toxic appearance. Eyes:   Conjunctivae are normal. EOMI. PERRL. ENT      Head:   Normocephalic and atraumatic.      Nose:   Wearing a mask.      Mouth/Throat:   Wearing a mask.      Neck:   No meningismus. Full ROM. Hematological/Lymphatic/Immunilogical:   No  cervical lymphadenopathy. Cardiovascular:   RRR. Symmetric bilateral radial and DP pulses.  No murmurs. Cap refill less than 2 seconds. Respiratory:   Normal respiratory effort without tachypnea/retractions. Breath sounds are clear and equal bilaterally. No wheezes/rales/rhonchi.  Musculoskeletal:   Normal range of motion in all extremities. No joint effusions.  No lower extremity tenderness.  No edema. Neurologic:   Normal speech and language.  Motor grossly intact. No acute focal neurologic deficits are appreciated.  Skin:    Skin is warm, dry and intact. No rash noted.  No petechiae, purpura, or bullae.  ____________________________________________    LABS (pertinent positives/negatives) (all labs ordered  are listed, but only abnormal results are displayed) Labs Reviewed - No data to display ____________________________________________   EKG    ____________________________________________    [id number]  DG Chest 2 View  Result Date: 04/22/2020 CLINICAL DATA:  Chest pain for 2 days, initial encounter EXAM: CHEST - 2 VIEW COMPARISON:  12/13/2018 FINDINGS: Cardiac shadows within normal limits. The lungs are clear bilaterally. No focal infiltrate or effusion is seen. No bony abnormality is noted. IMPRESSION: No active cardiopulmonary disease. Electronically Signed   By: Inez Catalina M.D.   On: 04/22/2020 20:04    ____________________________________________   PROCEDURES Procedures  ____________________________________________    CLINICAL IMPRESSION / ASSESSMENT AND PLAN / ED COURSE  Medications ordered in the ED: Medications  acetaminophen (TYLENOL) tablet 1,000 mg (has no administration in time range)  metoCLOPramide (REGLAN) tablet 10 mg (has no administration in time range)  diphenhydrAMINE (BENADRYL) capsule 50 mg (has no administration in time range)    Pertinent labs & imaging results that were available during my care of the patient were reviewed by me and  considered in my medical decision making (see chart for details).  Tonya Paul was evaluated in Emergency Department on 04/23/2020 for the symptoms described in the history of present illness. She was evaluated in the context of the global COVID-19 pandemic, which necessitated consideration that the patient might be at risk for infection with the SARS-CoV-2 virus that causes COVID-19. Institutional protocols and algorithms that pertain to the evaluation of patients at risk for COVID-19 are in a state of rapid change based on information released by regulatory bodies including the CDC and federal and state organizations. These policies and algorithms were followed during the patient's care in the ED.   Patient presents with benign headache syndrome consistent with influenza-like illness, most likely due to Covid infection.  Neurologically intact.  She has a low-grade fever and tachycardia here which is attributable to her likely viral illness.  Doubt bacterial infection.  I do not think she is septic.  I will give Tylenol, Reglan, Benadryl for headache relief.  Stable for discharge home.      ____________________________________________   FINAL CLINICAL IMPRESSION(S) / ED DIAGNOSES    Final diagnoses:  Influenza-like illness  Frontal headache  Fever with exposure to COVID-19 virus     ED Discharge Orders    None      Portions of this note were generated with dragon dictation software. Dictation errors may occur despite best attempts at proofreading.   Carrie Mew, MD 04/23/20 [phone]

## 2020-04-23 NOTE — ED Triage Notes (Addendum)
Pt to triage via w/c with no distress noted; pt just seen and d/c few hrs ago; st "I came for chest pain and a headache because I was exposed to [place] but I haven't gotten my test back yet"; pt c/o 20/10 frontal HA and "feeling cold"; st took 400mg  ibuprofen PTA without relief

## 2020-04-24 ENCOUNTER — Telehealth: Payer: Self-pay | Admitting: *Deleted

## 2020-04-24 NOTE — Telephone Encounter (Signed)
Transition Care Management Follow-up Telephone Call  Date of discharge and from where: 04/24/2019 [facility] ED  How have you been since you were released from the hospital? "I am feeling much better"  Any questions or concerns? No  Items Reviewed:  Did the pt receive and understand the discharge instructions provided? Yes   Medications obtained and verified? Yes   Other? N/A  Any new allergies since your discharge? No   Dietary orders reviewed? Yes  Do you have support at home? Yes   Home Care and Equipment/Supplies: Were home health services ordered? not applicable If so, what is the name of the agency? N/A  Has the agency set up a time to come to the patient's home? not applicable Were any new equipment or medical supplies ordered?  No What is the name of the medical supply agency? N/A Were you able to get the supplies/equipment? not applicable Do you have any questions related to the use of the equipment or supplies? No  Functional Questionnaire: (I = Independent and D = Dependent) ADLs: I  Bathing/Dressing- I  Meal Prep- I  Eating- I  Maintaining continence- I  Transferring/Ambulation- I  Managing Meds- I  Follow up appointments reviewed:   PCP Hospital f/u appt confirmed? No   - Patient will call to make appointment if necessary.   [facility] f/u appt confirmed? N/A   Are transportation arrangements needed? N/A  If their condition worsens, is the pt aware to call PCP or go to the Emergency Dept.? Yes  Was the patient provided with contact information for the PCP's office or ED? Yes  Was to pt encouraged to call back with questions or concerns? Yes

## 2020-04-30 ENCOUNTER — Ambulatory Visit: Payer: Medicaid Other | Attending: Physician Assistant | Admitting: Physician Assistant

## 2020-04-30 ENCOUNTER — Encounter: Payer: Self-pay | Admitting: Physician Assistant

## 2020-04-30 ENCOUNTER — Other Ambulatory Visit: Payer: Self-pay

## 2020-04-30 ENCOUNTER — Other Ambulatory Visit: Payer: Self-pay | Admitting: Physician Assistant

## 2020-04-30 DIAGNOSIS — E782 Mixed hyperlipidemia: Secondary | ICD-10-CM

## 2020-04-30 DIAGNOSIS — Z794 Long term (current) use of insulin: Secondary | ICD-10-CM

## 2020-04-30 DIAGNOSIS — E119 Type 2 diabetes mellitus without complications: Secondary | ICD-10-CM

## 2020-04-30 DIAGNOSIS — I10 Essential (primary) hypertension: Secondary | ICD-10-CM

## 2020-04-30 DIAGNOSIS — U071 COVID-19: Secondary | ICD-10-CM

## 2020-04-30 DIAGNOSIS — Z09 Encounter for follow-up examination after completed treatment for conditions other than malignant neoplasm: Secondary | ICD-10-CM

## 2020-04-30 MED ORDER — LANTUS SOLOSTAR 100 UNIT/ML ~~LOC~~ SOPN: 28.0000 [IU] | PEN_INJECTOR | Freq: Every day | SUBCUTANEOUS | 5 refills | Status: DC

## 2020-04-30 MED ORDER — PROPRANOLOL HCL ER 80 MG PO CP24: ORAL_CAPSULE | ORAL | 7 refills | Status: DC

## 2020-04-30 MED ORDER — ATORVASTATIN CALCIUM 20 MG PO TABS: 20.0000 mg | ORAL_TABLET | Freq: Every day | ORAL | 3 refills | Status: DC

## 2020-04-30 MED ORDER — TRUEPLUS PEN NEEDLES 31G X 5 MM MISC: 1 refills | Status: DC

## 2020-04-30 MED ORDER — METFORMIN HCL 500 MG PO TABS: 1000.0000 mg | ORAL_TABLET | Freq: Two times a day (BID) | ORAL | 1 refills | Status: DC

## 2020-04-30 MED FILL — PROPRANOLOL HCL ER 80 MG CP: 80 | 30 days supply | Qty: 30 | Fill #0

## 2020-04-30 MED FILL — METFORMIN HCL 500 MG TABS: 500 | 30 days supply | Qty: 120 | Fill #0

## 2020-04-30 MED FILL — ATORVASTATIN CALCIUM 20 MG: 20 | 30 days supply | Qty: 30 | Fill #0

## 2020-04-30 MED FILL — TRUEPLUS PEN NDL 31GX3/16: 31G X 5 MM | 25 days supply | Qty: 100 | Fill #0

## 2020-04-30 NOTE — Progress Notes (Signed)
Patient ID: Tonya Paul, female   DOB: 05-29-1975, 45 y.o.   MRN: [id number]   Virtual Visit via Telephone Note  I connected with [name] on 04/30/20 at  2:10 PM EST by telephone and verified that I am speaking with the correct person using two identifiers.  Location Patient: home Provider: [facility] office   I discussed the limitations, risks, security and privacy concerns of performing an evaluation and management service by telephone and the availability of in person appointments. I also discussed with the patient that there may be a patient responsible charge related to this service. The patient expressed understanding and agreed to proceed.   History of Present Illness: Seen in the ED for URI and diagnosed with Covid on 04/22/2020.  Her blood sugars were running in the 200-300 during the illness but otherwise have been 140-160. No blood sugars under 120.  Currently taking 25 units lantus and 1000mg  metformin.    Feeling much better since covid.  minimal congestion and cough now.  Taste and smell are WNL.       Observations/Objective:  NAD.  A&Ox3   Assessment and Plan: 1. COVID resolved  2. Essential hypertension continue - propranolol ER (INDERAL LA) 80 MG 24 hr capsule; TAKE 1 CAPSULE BY MOUTH EVERY DAY  Dispense: 30 capsule; Refill: 7  3. Insulin dependent type 2 diabetes mellitus (Tonya Paul) Home readings good;  None under 119.  Some hyperglycemia witrh covid that has improved but most blood sugars 140-160.  Will increase lantus by 3 units.   - insulin glargine (LANTUS SOLOSTAR) 100 UNIT/ML Solostar Pen; Inject 28 Units into the skin at bedtime.  Dispense: 9 mL; Refill: 5 - Insulin Pen Needle (TRUEPLUS PEN NEEDLES) 31G X 5 MM MISC; USE AS INSTRUCTED  Dispense: 100 each; Refill: 1 - atorvastatin (LIPITOR) 20 MG tablet; Take 1 tablet (20 mg total) by mouth daily.  Dispense: 90 tablet; Refill: 3 - metFORMIN (GLUCOPHAGE) 500 MG tablet; Take 2 tablets (1,000 mg total) by mouth 2 (two)  times daily with a meal.  Dispense: 270 tablet; Refill: 1  4. Encounter for examination following treatment at hospital  5. Mixed hyperlipidemia - atorvastatin (LIPITOR) 20 MG tablet; Take 1 tablet (20 mg total) by mouth daily.  Dispense: 90 tablet; Refill: 3    Follow Up Instructions: See PCP in 2 months-labs and A1C   I discussed the assessment and treatment plan with the patient. The patient was provided an opportunity to ask questions and all were answered. The patient agreed with the plan and demonstrated an understanding of the instructions.   The patient was advised to call back or seek an in-person evaluation if the symptoms worsen or if the condition fails to improve as anticipated.  I provided 17 minutes of non-face-to-face time during this encounter.   Freeman Caldron, PA-C

## 2020-05-02 MED FILL — LANTUS SOLOSTAR 100 UNITS/M: 100 | 32 days supply | Qty: 9 | Fill #0

## 2020-06-06 ENCOUNTER — Telehealth: Payer: Self-pay | Admitting: Cardiovascular Disease

## 2020-06-06 NOTE — Telephone Encounter (Signed)
Pt c/o Shortness Of Breath: STAT if SOB developed within the last 24 hours or pt is noticeably SOB on the phone  1. Are you currently SOB (can you hear that pt is SOB on the phone)? Yes  2. How long have you been experiencing SOB? Last couple months  3. Are you SOB when sitting or when up moving around? Yes with both  4. Are you currently experiencing any other symptoms? No  Appt has been schedule 07/02/20 with PA Marilynn Rail

## 2020-06-06 NOTE — Telephone Encounter (Signed)
Pt called to report that she has been having increased SOB with minimal exertion.. she says it is worse when she lays flat at night to sleep. She has to prop herself up on a pillow. She develops some lightheadedness when trying to catch her breath.   She denies peripheral edema, no chest pain, no palpitations.   Pt has not had her stress test or echo as ordered this past Fall.. she had Covid 04/2020 but she did not develop cough or lung issues when she had it... she had the Omicron virus and says her symptoms were mild.    She has follow up with Coletta Memos NP 07/02/20 will forward to Dr. Gwenlyn Found for review and any recommendations on testing prior to her upcoming appt.  Pt to call if her symptoms worsen or change.. will continue to monitor.

## 2020-06-07 NOTE — Telephone Encounter (Signed)
Should prob get the cor CTA as I had ordered when I saw her in the Fall before she sees [employer]  JJB

## 2020-06-09 MED FILL — PROPRANOLOL ER 80 MG CAP: 80 | 30 days supply | Qty: 30 | Fill #1

## 2020-06-09 MED FILL — LANTUS SOLOSTAR 100 UNITS/M: 100 | 32 days supply | Qty: 9 | Fill #1

## 2020-06-09 MED FILL — ATORVASTATIN CALCIUM 20 MG: 20 | 30 days supply | Qty: 30 | Fill #1

## 2020-06-12 NOTE — Telephone Encounter (Signed)
Left message for pt to call back to discuss coronary CTA.

## 2020-06-12 NOTE — Telephone Encounter (Signed)
Patient was returning phone call 

## 2020-06-13 NOTE — Telephone Encounter (Signed)
Spoke with Ms. Ruane on the phone about ordering a coronary CTA. Pt states that at this point in the time she is between insurance companies. Pt is currently trying to get coverage from medicaid. We will hold off on the coronary CTA at this point. Pt is scheduled for an OV with Coletta Memos, NP to discuss SOB with minimal exertion. All questions answered and pt verbalizes understanding.

## 2020-06-18 ENCOUNTER — Ambulatory Visit: Payer: Medicaid Other

## 2020-06-21 ENCOUNTER — Other Ambulatory Visit: Payer: Self-pay

## 2020-06-26 ENCOUNTER — Ambulatory Visit: Payer: Medicaid Other | Admitting: Physician Assistant

## 2020-06-26 DIAGNOSIS — E119 Type 2 diabetes mellitus without complications: Secondary | ICD-10-CM

## 2020-06-30 NOTE — Progress Notes (Signed)
Cardiology Clinic Note   Patient Name: Tonya Paul Date of Encounter: 07/02/2020  Primary Care Provider:  Gildardo Pounds, NP Primary Cardiologist:  Quay Burow, MD  Patient Profile    Tonya Paul 45 year old female presents the clinic today for follow-up evaluation of her long QT syndrome and essential hypertension.  She has also noticed increased shortness of breath with minimal exertion.  Past Medical History    Past Medical History:  Diagnosis Date  . Diabetes mellitus without complication ([place])   . Hemorrhoids 11/18/2015  . Hypertension   . Left breast mass 09/01/2015  . 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 Palpitations 02/03/2016   PVCs seen on monitor  . Vertigo    Past Surgical History:  Procedure Laterality Date  . BREAST BIOPSY Left 09/03/2015   [place] Core bx, benign  . ESOPHAGOGASTRODUODENOSCOPY (EGD) WITH PROPOFOL N/A 06/21/2017   Procedure: ESOPHAGOGASTRODUODENOSCOPY (EGD) WITH PROPOFOL;  Surgeon: Lin Landsman, MD;  Location: [facility];  Service: Gastroenterology;  Laterality: N/A;    Allergies  Allergies  Allergen Reactions  . Ace Inhibitors Shortness Of Breath    Reported by the patient after a trial of lisinopril prescribes by PCP  . Aspirin Anaphylaxis  . Rocephin [Ceftriaxone] Itching  . Ciprofloxacin Itching  . Prednisone Palpitations    History of Present Illness    Ms. Zapf has a PMH of long QT syndrome, essential hypertension, elevated coronary calcium score, GERD, type 2 diabetes, left breast mass, obesity, palpitations, dizziness, and HLD.  She initially referred herself for evaluation of inheritable causes of sudden cardiac death.  She reported that her 40 year old son had been recently discharged from the [place] at age of 84 for medical reasons.  There was question as to whether he had an MI.  He unfortunately ended up passing away suddenly 11/11/2012 of unclear reasons.  She has a fourth cousin who passed  away of sudden cardiac death as well.  Her only noted risk factor for heart disease is type 2 diabetes.  She reported occasionally getting shortness of breath but denied chest pain.  She has also noted palpitations daily over the last several months.  She denied caffeine or alcohol use.  An echocardiogram showed normal LV function with mild-moderate TR.  She was referred to Dr. Caryl Comes for further evaluation.  A routine GXT showed normal values except for exercise-induced prolongation of her QT interval which was suggestive of long QT syndrome.  Her son [name] was genetically tested at [facility] and did not carry the gene.  She complained of occasional atypical chest pain which was felt to be musculoskeletal.  She was last seen by Dr. Gwenlyn Found on 01/04/2020.  During that time she was doing well.  She had finished wearing a cardiac event monitor that was ordered by Dr. Caryl Comes.  She was also planning on doing exercise GXT.  She reported occasional palpitations.  She did report an episode of chest pain with radiation to her neck/arm.  A coronary calcium score was ordered 01/19/2019 which showed a value of 47.  She contacted Dr. Gwenlyn Found on 06/06/2020 who recommended coronary CTA.  However, she was between insurance companies at the time and was trying to obtain coverage through Medicaid.  Coronary CTA was put on hold.  She presents the clinic today for follow-up evaluation and states she has recently noticed some increased shortness of breath during conversations, walking, and increased physical activity.  She reports that she also is having  some pressure at the top of her stomach.  She reports that she had an EGD and was told she had a hiatal hernia.  She is no longer taking any medication for reflux.  She occasionally takes Tums which help with the discomfort.  She also reports that she will belch and feel better.  We will order an echocardiogram, start Protonix, give her the GERD diet information, give her the  mindfulness stress reduction sheet and have her follow-up after her echocardiogram  Today she denies chest pain, shortness of breath, lower extremity edema, fatigue, palpitations, melena, hematuria, hemoptysis, diaphoresis, weakness, presyncope, syncope, orthopnea, and PND.   Home Medications    Prior to Admission medications   Medication Sig Start Date End Date Taking? Authorizing Provider  atorvastatin (LIPITOR) 20 MG tablet TAKE 1 TABLET (20 MG TOTAL) BY MOUTH DAILY. 04/30/20 04/30/21  Argentina Donovan, PA-C  Blood Glucose Monitoring Suppl (TRUE METRIX METER) DEVI 1 each by Does not apply route 3 (three) times daily before meals. 04/16/15   Charlott Rakes, MD  Blood Pressure Monitor DEVI Please provide patient with insurance approved blood pressure monitor ICD10.0 11/23/19   Gildardo Pounds, NP  butalbital-acetaminophen-caffeine (FIORICET) 50-325-40 MG tablet Take 1-2 tablets by mouth every 6 (six) hours as needed for headache. 08/21/19 08/20/20  Gildardo Pounds, NP  EPINEPHrine 0.3 mg/0.3 mL IJ SOAJ injection Inject 0.3 mLs (0.3 mg total) into the muscle as needed for anaphylaxis. 07/15/19   Rudene Re, MD  ferrous sulfate 324 (65 Fe) MG TBEC Take 1 tablet (325 mg total) by mouth 2 (two) times daily. 12/31/19 03/30/20  Gildardo Pounds, NP  glucose blood (RELION GLUCOSE TEST STRIPS) test strip Use as instructed 07/30/16   Fredia Beets R, FNP  insulin glargine (LANTUS) 100 UNIT/ML Solostar Pen INJECT 28 UNITS INTO THE SKIN AT BEDTIME. 04/30/20 04/30/21  Argentina Donovan, PA-C  Insulin Pen Needle 31G X 5 MM MISC USE AS INSTRUCTED 04/30/20 04/30/21  Argentina Donovan, PA-C  Insulin Syringe-Needle U-100 (B-D INS SYRINGE 0.5CC/31GX5/16) 31G X 5/16" 0.5 ML MISC USE AS DIRECTED DAILY FOR INSULIN ADMINISTRATION 11/29/16   Fredia Beets R, FNP  metFORMIN (GLUCOPHAGE) 500 MG tablet TAKE 2 TABLETS (1,000 MG TOTAL) BY MOUTH 2 (TWO) TIMES DAILY WITH A MEAL. 04/30/20 04/30/21  Argentina Donovan, PA-C   metoCLOPramide (REGLAN) 10 MG tablet Take 1 tablet (10 mg total) by mouth every 6 (six) hours as needed. Patient not taking: Reported on 04/30/2020 04/23/20   Carrie Mew, MD  propranolol ER (INDERAL LA) 80 MG 24 hr capsule TAKE 1 CAPSULE BY MOUTH EVERY DAY 04/30/20 04/30/21  Argentina Donovan, PA-C  lisinopril (ZESTRIL) 5 MG tablet Take 1 tablet (5 mg total) by mouth daily. 01/04/20 03/01/20  Lorretta Harp, MD    Family History    Family History  Problem Relation Age of Onset  . Hypertension Mother   . Lung cancer Mother   . Throat cancer Father   . Diabetes Sister   . Lupus Maternal Grandmother   . Stroke Maternal Grandfather   . Sudden Cardiac Death Daughter   . Breast cancer Paternal Aunt    She indicated that her mother is deceased. She indicated that her father is deceased. She indicated that her sister is alive. She indicated that her maternal grandmother is deceased. She indicated that her maternal grandfather is deceased. She indicated that her paternal grandmother is deceased. She indicated that her paternal grandfather is deceased. She indicated that  her daughter is deceased. She indicated that her son is alive. She indicated that her paternal aunt is deceased.  Social History    Social History   Socioeconomic History  . Marital status: Divorced    Spouse name: Not on file  . Number of children: Not on file  . Years of education: Not on file  . Highest education level: Not on file  Occupational History  . Not on file  Tobacco Use  . Smoking status: Never Smoker  . Smokeless tobacco: Never Used  Vaping Use  . Vaping Use: Never used  Substance and Sexual Activity  . Alcohol use: No  . Drug use: No  . Sexual activity: Yes    Birth control/protection: None  Other Topics Concern  . Not on file  Social History Narrative  . Not on file   Social Determinants of Health   Financial Resource Strain: Not on file  Food Insecurity: Not on file  Transportation  Needs: Not on file  Physical Activity: Not on file  Stress: Not on file  Social Connections: Not on file  Intimate Partner Violence: Not on file     Review of Systems    General:  No chills, fever, night sweats or weight changes.  Cardiovascular:  No chest pain, dyspnea on exertion, edema, orthopnea, palpitations, paroxysmal nocturnal dyspnea. Dermatological: No rash, lesions/masses Respiratory: No cough, dyspnea Urologic: No hematuria, dysuria Abdominal:   No nausea, vomiting, diarrhea, bright red blood per rectum, melena, or hematemesis Neurologic:  No visual changes, wkns, changes in mental status. All other systems reviewed and are otherwise negative except as noted above.  Physical Exam    VS:  BP 136/88 (BP Location: Left Arm, Patient Position: Sitting)   Pulse 83   Ht 5\' 10"  (1.778 m)   Wt 201 lb 6.4 oz (91.4 kg)   SpO2 98%   BMI 28.90 kg/m  , BMI Body mass index is 28.9 kg/m. GEN: Well nourished, well developed, in no acute distress. HEENT: normal. Neck: Supple, no JVD, carotid bruits, or masses. Cardiac: RRR, no murmurs, rubs, or gallops. No clubbing, cyanosis, edema.  Radials/DP/PT 2+ and equal bilaterally.  Respiratory:  Respirations regular and unlabored, clear to auscultation bilaterally. GI: Soft, nontender, nondistended, BS + x 4. MS: no deformity or atrophy. Skin: warm and dry, no rash. Neuro:  Strength and sensation are intact. Psych: Normal affect.  Accessory Clinical Findings    Recent Labs: 02/26/2020: ALT 23 04/22/2020: BUN 10; Creatinine, Ser 0.81; Hemoglobin 11.1; Platelets 354; Potassium 3.6; Sodium 136   Recent Lipid Panel    Component Value Date/Time   CHOL 162 11/28/2019 1615   TRIG 112 11/28/2019 1615   HDL 42 11/28/2019 1615   CHOLHDL 3.9 11/28/2019 1615   CHOLHDL 3.6 04/18/2015 0936   VLDL 13 04/18/2015 0936   LDLCALC 100 (H) 11/28/2019 1615    ECG personally reviewed by me today- none today.  Cardiac event monitor 01/04/2020 .  SR/SB/ST 2. Occasional PVCs 3. Short runs of NSVT   Exercise tolerance test 02/26/2016   Blood pressure demonstrated a normal response to exercise.    Stress Findings  Stress Findings The patient exercised following a manual protocol.   The patient reported no symptoms during the stress test. The patient experienced no angina during the stress test.   The patient requested the test to be stopped. The test was stopped because  the patient complained of fatigue.   Blood pressure and heart rate demonstrated a normal response  to exercise. Blood pressure demonstrated a normal response to exercise. Overall, the patient's exercise capacity was mildly impaired.   Recovery time:  7 minutes.  The patient's response to exercise was adequate for diagnosis.   Stress Measurements  Baseline Vitals  Rest HR 95 bpm    Rest BP 137/83 mmHg    Exercise Time  Exercise duration (min) 4 min    Exercise duration (sec) 43 sec    Peak Stress Vitals  Peak HR 153 BPM    Peak BP 190 mmHg    Exercise Data  MPHR 180 bpm    Percent HR 86 %    RPE 16     Estimated workload 9.3 METS          Echocardiogram 12/12/2015 Study Conclusions   - Left ventricle: The cavity size was normal. Systolic function was  normal. Wall motion was normal; there were no regional wall  motion abnormalities. Doppler parameters are consistent with  abnormal left ventricular relaxation (grade 1 diastolic  dysfunction).  - Atrial septum: No defect or patent foramen ovale was identified.  - Tricuspid valve: There was mild-moderate regurgitation.  - Pulmonary arteries: PA peak pressure: 32 mm Hg (S).   Coronary calcium score 01/19/2019 TECHNIQUE: The patient was scanned on a [employer]. Axial non-contrast 3 mm slices were carried out through the heart. The data set was analyzed on a dedicated work station and scored using the [place].  FINDINGS: Non-cardiac: See separate report from  [facility].  Ascending Aorta: Normal size, no calcifications.  Pericardium: Normal.  Coronary arteries: Normal origin.  IMPRESSION: Coronary calcium score of 47. This was 71 percentile for age and sex matched control.   Electronically Signed   By: Ena Dawley   On: 01/19/2019 11:19  Assessment & Plan   1.  Shortness of breath/GERD -reports increased shortness of breath with minimal physical activity over the last several months.  Coronary CTA recommended however patient between insurance and test placed on hold.  Appears to be related to acid reflux. Order Protonix 20 mg daily Order echocardiogram  Recommend coronary CTA-will contact office when her insurance is reinstated.  Long QT syndrome-history of prolonged QT during exercise and family history of sudden cardiac death.  Her son was genetically tested and did not have the gene. Follows with Dr. Caryl Comes  Palpitations-no increased episodes of palpitations.  Continues with occasional extra beats.  Cardiac event monitor previously showed PVCs. Continue propranolol-may move the propranolol to evening. Heart healthy low-sodium diet-salty 6 given Increase physical activity as tolerated Mindfulness stress reduction sheet Avoid triggers for palpitations alcohol, caffeine, chocolate, dehydration etc.    Essential hypertension-BP today 136/88.  Well-controlled at home. Continue propranolol Heart healthy low-sodium diet-salty 6 given Increase physical activity as tolerated  Hyperlipidemia- 11/28/2019: Cholesterol, Total 162; HDL 42; LDL Chol Calc (NIH) 100; Triglycerides 112 Continue atorvastatin Heart healthy low-sodium high-fiber diet increase physical activity as tolerated  Elevated coronary calcium score-47 on 01/19/2019.  Coronary CTA recommended for further evaluation.  Test not completed due to insurance reasons. Recommend follow-up coronary CTA.  Disposition: Follow-up with Dr. Gwenlyn Found after  echocardiogram.  Jossie Ng. Oday Ridings NP-C    07/02/2020, 10:13 AM [address] Office [phone] Fax [phone]  Notice: This dictation was prepared with Dragon dictation along with smaller phrase technology. Any transcriptional errors that result from this process are unintentional and may not be corrected upon review.  I spent 14 minutes examining  this patient, reviewing medications, and using patient centered shared decision making involving her cardiac care.  Prior to her visit I spent greater than 20 minutes reviewing her past medical history,  medications, and prior cardiac tests.

## 2020-07-02 ENCOUNTER — Other Ambulatory Visit: Payer: Self-pay

## 2020-07-02 ENCOUNTER — Ambulatory Visit (INDEPENDENT_AMBULATORY_CARE_PROVIDER_SITE_OTHER): Payer: Self-pay | Admitting: General Practice

## 2020-07-02 ENCOUNTER — Encounter: Payer: Self-pay | Admitting: General Practice

## 2020-07-02 VITALS — BP 136/88 | HR 83 | Ht 70.0 in | Wt 201.4 lb

## 2020-07-02 DIAGNOSIS — K219 Gastro-esophageal reflux disease without esophagitis: Secondary | ICD-10-CM

## 2020-07-02 DIAGNOSIS — I10 Essential (primary) hypertension: Secondary | ICD-10-CM

## 2020-07-02 DIAGNOSIS — R002 Palpitations: Secondary | ICD-10-CM

## 2020-07-02 DIAGNOSIS — R931 Abnormal findings on diagnostic imaging of heart and coronary circulation: Secondary | ICD-10-CM

## 2020-07-02 DIAGNOSIS — E785 Hyperlipidemia, unspecified: Secondary | ICD-10-CM

## 2020-07-02 DIAGNOSIS — R0602 Shortness of breath: Secondary | ICD-10-CM

## 2020-07-02 DIAGNOSIS — I4581 Long QT syndrome: Secondary | ICD-10-CM

## 2020-07-02 MED ORDER — PANTOPRAZOLE SODIUM 20 MG PO TBEC
20.0000 mg | DELAYED_RELEASE_TABLET | Freq: Every day | ORAL | 6 refills | Status: DC
  Filled 2020-07-02: qty 30, 30d supply, fill #0

## 2020-07-02 NOTE — Patient Instructions (Signed)
Medication Instructions:  START PANTOPRAZOLE 20MG  DAILY *If you need a refill on your cardiac medications before your next appointment, please call your pharmacy*  Echocardiogram - Your physician has requested that you have an echocardiogram. Echocardiography is a painless test that uses sound waves to create images of your heart. It provides your doctor with information about the size and shape of your heart and how well your heart's chambers and valves are working. This procedure takes approximately one hour. There are no restrictions for this procedure. This will be performed at our [facility] location - [address].  Special Instructions PLEASE READ AND FOLLOW GERD DIET-ATTACHED  PLEASE READ AND FOLLOW STRESS REDUCTION TIPS-ATTACHED  PLEASE READ AND FOLLOW SALTY 6-ATTACHED-1,800mg  daily  PLEASE INCREASE PHYSICAL ACTIVITY AS TOLERATED  Follow-Up: Your next appointment:  AFTER TESTING In Person with Tonya Burow, MD OR IF UNAVAILABLE Elsie, FNP-C  At [facility], you and your health needs are our priority.  As part of our continuing mission to provide you with exceptional heart care, we have created designated Provider Care Teams.  These Care Teams include your primary Cardiologist (physician) and Advanced Practice Providers (APPs -  Physician Assistants and Nurse Practitioners) who all work together to provide you with the care you need, when you need it.            6 SALTY THINGS TO AVOID     1,800MG  DAILY     Food Choices for Gastroesophageal Reflux Disease, Adult When you have gastroesophageal reflux disease (GERD), the foods you eat and your eating habits are very important. Choosing the right foods can help ease your discomfort. Think about working with a food expert (dietitian) to help you make good choices. What are tips for following this plan? Reading food labels  Look for foods that are low in saturated fat. Foods that may help with your  symptoms include: ? Foods that have less than 5% of daily value (DV) of fat. ? Foods that have 0 grams of trans fat. Cooking  Do not fry your food.  Cook your food by baking, steaming, grilling, or broiling. These are all methods that do not need a lot of fat for cooking.  To add flavor, try to use herbs that are low in spice and acidity. Meal planning  Choose healthy foods that are low in fat, such as: ? Fruits and vegetables. ? Whole grains. ? Low-fat dairy products. ? Lean meats, fish, and poultry.  Eat small meals often instead of eating 3 large meals each day. Eat your meals slowly in a place where you are relaxed. Avoid bending over or lying down until 2-3 hours after eating.  Limit high-fat foods such as fatty meats or fried foods.  Limit your intake of fatty foods, such as oils, butter, and shortening.  Avoid the following as told by your doctor: ? Foods that cause symptoms. These may be different for different people. Keep a food diary to keep track of foods that cause symptoms. ? Alcohol. ? Drinking a lot of liquid with meals. ? Eating meals during the 2-3 hours before bed.   Lifestyle  Stay at a healthy weight. Ask your doctor what weight is healthy for you. If you need to lose weight, work with your doctor to do so safely.  Exercise for at least 30 minutes on 5 or more days each week, or as told by your doctor.  Wear loose-fitting clothes.  Do not smoke or use  any products that contain nicotine or tobacco. If you need help quitting, ask your doctor.  Sleep with the head of your bed higher than your feet. Use a wedge under the mattress or blocks under the bed frame to raise the head of the bed.  Chew sugar-free gum after meals. What foods should eat? Eat a healthy, well-balanced diet of fruits, vegetables, whole grains, low-fat dairy products, lean meats, fish, and poultry. Each person is different. Foods that may cause symptoms in one person may not cause any  symptoms in another person. Work with your doctor to find foods that are safe for you. The items listed above may not be a complete list of what you can eat and drink. Contact a food expert for more options.   What foods should I avoid? Limiting some of these foods may help in managing the symptoms of GERD. Everyone is different. Talk with a food expert or your doctor to help you find the exact foods to avoid, if any. Fruits Any fruits prepared with added fat. Any fruits that cause symptoms. For some people, this may include citrus fruits, such as oranges, grapefruit, pineapple, and lemons. Vegetables Deep-fried vegetables. [place] fries. Any vegetables prepared with added fat. Any vegetables that cause symptoms. For some people, this may include tomatoes and tomato products, chili peppers, onions and garlic, and horseradish. Grains Pastries or quick breads with added fat. Meats and other proteins High-fat meats, such as fatty beef or pork, hot dogs, ribs, ham, sausage, salami, and bacon. Fried meat or protein, including fried fish and fried chicken. Nuts and nut butters, in large amounts. Dairy Whole milk and chocolate milk. Sour cream. Cream. Ice cream. Cream cheese. Milkshakes. Fats and oils Butter. Margarine. Shortening. Ghee. Beverages Coffee and tea, with or without caffeine. Carbonated beverages. Sodas. Energy drinks. Fruit juice made with acidic fruits, such as orange or grapefruit. Tomato juice. Alcoholic drinks. Sweets and desserts Chocolate and cocoa. Donuts. Seasonings and condiments Pepper. Peppermint and spearmint. Added salt. Any condiments, herbs, or seasonings that cause symptoms. For some people, this may include curry, hot sauce, or vinegar-based salad dressings. The items listed above may not be a complete list of what you should not eat and drink. Contact a food expert for more options. Questions to ask your doctor Diet and lifestyle changes are often the first steps that  are taken to manage symptoms of GERD. If diet and lifestyle changes do not help, talk with your doctor about taking medicines. Where to find more information  International Foundation for Gastrointestinal Disorders: aboutgerd.org Summary  When you have GERD, food and lifestyle choices are very important in easing your symptoms.  Eat small meals often instead of 3 large meals a day. Eat your meals slowly and in a place where you are relaxed.  Avoid bending over or lying down until 2-3 hours after eating.  Limit high-fat foods such as fatty meats or fried foods. This information is not intended to replace advice given to you by your health care provider. Make sure you discuss any questions you have with your health care provider. Document Revised: 09/17/2019 Document Reviewed: 09/17/2019 Elsevier Patient Education  2021 [place].   Mindfulness-Based Stress Reduction Mindfulness-based stress reduction (MBSR) is a program that helps people learn to practice mindfulness. Mindfulness is the practice of intentionally paying attention to the present moment. MBSR focuses on developing self-awareness, which allows you to respond to life stress without judgment or negative emotions. It can be learned and practiced  through techniques such as education, breathing exercises, meditation, and yoga. MBSR includes several mindfulness techniques in one program. MBSR works best when you understand the treatment, are willing to try new things, and can commit to spending time practicing what you learn. MBSR training may include learning about:  How your emotions, thoughts, and reactions affect your body.  New ways to respond to things that cause negative thoughts to start (triggers).  How to notice your thoughts and let go of them.  Practicing awareness of everyday things that you normally do without thinking.  The techniques and goals of different types of meditation. What are the benefits of  MBSR? MBSR can have many benefits, which include helping you to:  Develop self-awareness. This refers to knowing and understanding yourself.  Learn skills and attitudes that help you to participate in your own health care.  Learn new ways to care for yourself.  Be more accepting about how things are, and let things go.  Be less judgmental and approach things with an open mind.  Be patient with yourself and trust yourself more. MBSR has also been shown to:  Reduce negative emotions, such as depression and anxiety.  Improve memory and focus.  Change how you sense and approach pain.  Boost your body's ability to fight infections.  Help you connect better with other people.  Improve your sense of well-being. Follow these instructions at home:  Find a local in-person or online MBSR program.  Set aside some time regularly for mindfulness practice.  Find a mindfulness practice that works best for you. This may include one or more of the following: ? Meditation. Meditation involves focusing your mind on a certain thought or activity. ? Breathing awareness exercises. These help you to stay present by focusing on your breath. ? Body scan. For this practice, you lie down and pay attention to each part of your body from head to toe. You can identify tension and soreness and intentionally relax parts of your body. ? Yoga. Yoga involves stretching and breathing, and it can improve your ability to move and be flexible. It can also provide an experience of testing your body's limits, which can help you release stress. ? Mindful eating. This way of eating involves focusing on the taste, texture, color, and smell of each bite of food. Because this slows down eating and helps you feel full sooner, it can be an important part of a weight-loss plan.  Find a podcast or recording that provides guidance for breathing awareness, body scan, or meditation exercises. You can listen to these any time when  you have a free moment to rest without distractions.  Follow your treatment plan as told by your health care provider. This may include taking regular medicines and making changes to your diet or lifestyle as recommended.   How to practice mindfulness To do a basic awareness exercise:  Find a comfortable place to sit.  Pay attention to the present moment. Observe your thoughts, feelings, and surroundings just as they are.  Avoid placing judgment on yourself, your feelings, or your surroundings. Make note of any judgment that comes up, and let it go.  Your mind may wander, and that is okay. Make note of when your thoughts drift, and return your attention to the present moment. To do basic mindfulness meditation:  Find a comfortable place to sit. This may include a stable chair or a firm floor cushion. ? Sit upright with your back straight. Let your arms fall next  to your side with your hands resting on your legs. ? If sitting in a chair, rest your feet flat on the floor. ? If sitting on a cushion, cross your legs in front of you.  Keep your head in a neutral position with your chin dropped slightly. Relax your jaw and rest the tip of your tongue on the roof of your mouth. Drop your gaze to the floor. You can close your eyes if you like.  Breathe normally and pay attention to your breath. Feel the air moving in and out of your nose. Feel your belly expanding and relaxing with each breath.  Your mind may wander, and that is okay. Make note of when your thoughts drift, and return your attention to your breath.  Avoid placing judgment on yourself, your feelings, or your surroundings. Make note of any judgment or feelings that come up, let them go, and bring your attention back to your breath.  When you are ready, lift your gaze or open your eyes. Pay attention to how your body feels after the meditation. Where to find more information You can find more information about MBSR from:  Your  health care provider.  Community-based meditation centers or programs.  Programs offered near you. Summary  Mindfulness-based stress reduction (MBSR) is a program that teaches you how to intentionally pay attention to the present moment. It is used with other treatments to help you cope better with daily stress, emotions, and pain.  MBSR focuses on developing self-awareness, which allows you to respond to life stress without judgment or negative emotions.  MBSR programs may involve learning different mindfulness practices, such as breathing exercises, meditation, yoga, body scan, or mindful eating. Find a mindfulness practice that works best for you, and set aside time for it on a regular basis. This information is not intended to replace advice given to you by your health care provider. Make sure you discuss any questions you have with your health care provider. Document Revised: 11/23/2019 Document Reviewed: 11/23/2019 Elsevier Patient Education  2021 [employer].

## 2020-07-08 ENCOUNTER — Other Ambulatory Visit: Payer: Self-pay

## 2020-07-29 ENCOUNTER — Other Ambulatory Visit: Payer: Self-pay

## 2020-07-29 ENCOUNTER — Encounter: Payer: Self-pay | Admitting: Nurse Practitioner

## 2020-07-29 ENCOUNTER — Ambulatory Visit: Payer: Self-pay | Attending: Nurse Practitioner | Admitting: Nurse Practitioner

## 2020-07-29 VITALS — BP 145/88 | HR 75 | Resp 18 | Ht 70.0 in | Wt 206.6 lb

## 2020-07-29 DIAGNOSIS — Z79899 Other long term (current) drug therapy: Secondary | ICD-10-CM | POA: Insufficient documentation

## 2020-07-29 DIAGNOSIS — E782 Mixed hyperlipidemia: Secondary | ICD-10-CM

## 2020-07-29 DIAGNOSIS — I10 Essential (primary) hypertension: Secondary | ICD-10-CM | POA: Insufficient documentation

## 2020-07-29 DIAGNOSIS — Z7984 Long term (current) use of oral hypoglycemic drugs: Secondary | ICD-10-CM | POA: Insufficient documentation

## 2020-07-29 DIAGNOSIS — Z794 Long term (current) use of insulin: Secondary | ICD-10-CM | POA: Insufficient documentation

## 2020-07-29 DIAGNOSIS — E785 Hyperlipidemia, unspecified: Secondary | ICD-10-CM | POA: Insufficient documentation

## 2020-07-29 DIAGNOSIS — Z833 Family history of diabetes mellitus: Secondary | ICD-10-CM | POA: Insufficient documentation

## 2020-07-29 DIAGNOSIS — D649 Anemia, unspecified: Secondary | ICD-10-CM | POA: Insufficient documentation

## 2020-07-29 DIAGNOSIS — E119 Type 2 diabetes mellitus without complications: Secondary | ICD-10-CM | POA: Insufficient documentation

## 2020-07-29 DIAGNOSIS — Z886 Allergy status to analgesic agent status: Secondary | ICD-10-CM | POA: Insufficient documentation

## 2020-07-29 LAB — POCT GLYCOSYLATED HEMOGLOBIN (HGB A1C): HbA1c, POC (controlled diabetic range): 8.9 % — AB (ref 0.0–7.0)

## 2020-07-29 LAB — GLUCOSE, POCT (MANUAL RESULT ENTRY): POC Glucose: 123 mg/dl — AB (ref 70–99)

## 2020-07-29 MED ORDER — ATORVASTATIN CALCIUM 20 MG PO TABS
ORAL_TABLET | Freq: Every day | ORAL | 3 refills | Status: DC
  Filled 2020-07-29: qty 30, 30d supply, fill #0
  Filled 2020-10-17: qty 30, 30d supply, fill #1

## 2020-07-29 MED ORDER — PROPRANOLOL HCL ER 80 MG PO CP24
ORAL_CAPSULE | Freq: Every day | ORAL | 1 refills | Status: DC
  Filled 2020-07-29: qty 30, 30d supply, fill #0
  Filled 2020-09-09: qty 30, 30d supply, fill #1
  Filled 2020-10-17: qty 30, 30d supply, fill #2

## 2020-07-29 MED ORDER — INSULIN GLARGINE 100 UNIT/ML SOLOSTAR PEN
30.0000 [IU] | PEN_INJECTOR | Freq: Every day | SUBCUTANEOUS | 1 refills | Status: DC
  Filled 2020-07-29: qty 12, 30d supply, fill #0

## 2020-07-29 MED ORDER — METFORMIN HCL 500 MG PO TABS
ORAL_TABLET | Freq: Two times a day (BID) | ORAL | 1 refills | Status: DC
  Filled 2020-07-29: qty 120, 30d supply, fill #0
  Filled 2020-10-17: qty 120, 30d supply, fill #1
  Filled 2020-12-17: qty 120, 30d supply, fill #2
  Filled 2021-01-19: qty 120, 30d supply, fill #3
  Filled 2021-04-14: qty 120, 30d supply, fill #4
  Filled 2021-04-17: qty 120, 30d supply, fill #0
  Filled 2021-04-17: qty 60, 15d supply, fill #0

## 2020-07-29 NOTE — Progress Notes (Signed)
Assessment & Plan:  Tonya Paul was seen today for diabetes.  Diagnoses and all orders for this visit:  Insulin dependent type 2 diabetes mellitus ([place]) -     CMP14+EGFR -     Microalbumin / creatinine urine ratio -     Ambulatory referral to Ophthalmology -     POCT glucose (manual entry) -     POCT glycosylated hemoglobin (Hb A1C) -     atorvastatin (LIPITOR) 20 MG tablet; TAKE 1 TABLET (20 MG TOTAL) BY MOUTH DAILY. -     metFORMIN (GLUCOPHAGE) 500 MG tablet; TAKE 2 TABLETS (1,000 MG TOTAL) BY MOUTH 2 (TWO) TIMES DAILY WITH A MEAL. -     insulin glargine (LANTUS) 100 UNIT/ML Solostar Pen; Inject 30 Units into the skin at bedtime. Increase lantus to 30 units nightly. Increase by 2 units every 3 days for blood glucose level greater than 140. Continue blood sugar control as discussed in office today, low carbohydrate diet, and regular physical exercise as tolerated, 150 minutes per week (30 min each day, 5 days per week, or 50 min 3 days per week). Keep blood sugar logs with fasting goal of 90-130 mg/dl, post prandial (after you eat) less than 180.  For Hypoglycemia: BS <60 and Hyperglycemia BS >400; contact the clinic ASAP. Annual eye exams and foot exams are recommended.  Essential hypertension -     propranolol ER (INDERAL LA) 80 MG 24 hr capsule; TAKE 1 CAPSULE BY MOUTH EVERY DAY Continue all antihypertensives as prescribed.  Remember to bring in your blood pressure log with you for your follow up appointment.  DASH/Mediterranean Diets are healthier choices for HTN.   Anemia, unspecified type -     CBC  Dyslipidemia, goal LDL below 70 -     Lipid panel -     atorvastatin (LIPITOR) 20 MG tablet; TAKE 1 TABLET (20 MG TOTAL) BY MOUTH DAILY. INSTRUCTIONS: Work on a low fat, heart healthy diet and participate in regular aerobic exercise program by working out at least 150 minutes per week; 5 days a week-30 minutes per day. Avoid red meat/beef/steak,  fried foods. junk foods, sodas, sugary  drinks, unhealthy snacking, alcohol and smoking.  Drink at least 80 oz of water per day and monitor your carbohydrate intake daily.     Patient has been counseled on age-appropriate routine health concerns for screening and prevention. These are reviewed and up-to-date. Referrals have been placed accordingly. Immunizations are up-to-date or declined.    Subjective:   Chief Complaint  Patient presents with  . Diabetes   HPI Tonya Paul 45 y.o. female presents to office today for follow up He has a past medical history of Diabetes mellitus without complication, Hemorrhoids (11/18/2015), Hypertension, Left breast mass (09/01/2015), Long Q-T syndrome (01/14/2017), Palpitations (02/03/2016), and Vertigo.    DM 2 Not well controlled. She states for several months she has not been diet controlled. Will increase lantus to 30 units nightly today. She will continue on metformin 500 mg BID. LDL not at goal. She has been prescribed atorvastatin 20 mg daily. Denies any statin intolerance.  Lab Results  Component Value Date   HGBA1C 8.9 (A) 07/29/2020   Lab Results  Component Value Date   HGBA1C 8.2 (H) 11/28/2019   Lab Results  Component Value Date   LDLCALC 89 07/29/2020   Blood pressure is not well controlled. She is to work on diet and weight reduction. If continues elevated will need to start low dose losartan BP Readings  from Last 3 Encounters:  08/08/20 (!) 148/108  07/29/20 (!) 145/88  07/02/20 136/88   Review of Systems  Constitutional: Negative for fever, malaise/fatigue and weight loss.  HENT: Negative.  Negative for nosebleeds.   Eyes: Negative.  Negative for blurred vision, double vision and photophobia.  Respiratory: Negative.  Negative for cough and shortness of breath.   Cardiovascular: Negative.  Negative for chest pain, palpitations and leg swelling.  Gastrointestinal: Negative.  Negative for heartburn, nausea and vomiting.  Musculoskeletal: Negative.  Negative for  myalgias.  Neurological: Negative.  Negative for dizziness, focal weakness, seizures and headaches.  Psychiatric/Behavioral: Negative.  Negative for suicidal ideas.    Past Medical History:  Diagnosis Date  . Diabetes mellitus without complication ([place])   . Hemorrhoids 11/18/2015  . Hypertension   . Left breast mass 09/01/2015  . 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 Palpitations 02/03/2016   PVCs seen on monitor  . Vertigo     Past Surgical History:  Procedure Laterality Date  . BREAST BIOPSY Left 09/03/2015   [place] Core bx, benign  . ESOPHAGOGASTRODUODENOSCOPY (EGD) WITH PROPOFOL N/A 06/21/2017   Procedure: ESOPHAGOGASTRODUODENOSCOPY (EGD) WITH PROPOFOL;  Surgeon: Lin Landsman, MD;  Location: [facility];  Service: Gastroenterology;  Laterality: N/A;    Family History  Problem Relation Age of Onset  . Hypertension Mother   . Lung cancer Mother   . Throat cancer Father   . Diabetes Sister   . Lupus Maternal Grandmother   . Stroke Maternal Grandfather   . Sudden Cardiac Death Daughter   . Breast cancer Paternal Aunt     Social History Reviewed with no changes to be made today.   Outpatient Medications Prior to Visit  Medication Sig Dispense Refill  . Blood Glucose Monitoring Suppl (TRUE METRIX METER) DEVI 1 each by Does not apply route 3 (three) times daily before meals. 1 Device 0  . Blood Pressure Monitor DEVI Please provide patient with insurance approved blood pressure monitor ICD10.0 1 each 0  . EPINEPHrine 0.3 mg/0.3 mL IJ SOAJ injection Inject 0.3 mLs (0.3 mg total) into the muscle as needed for anaphylaxis. 1 each 0  . glucose blood (RELION GLUCOSE TEST STRIPS) test strip Use as instructed 100 each 12  . Insulin Pen Needle 31G X 5 MM MISC USE AS INSTRUCTED 100 each 1  . Insulin Syringe-Needle U-100 (B-D INS SYRINGE 0.5CC/31GX5/16) 31G X 5/16" 0.5 ML MISC USE AS DIRECTED DAILY FOR INSULIN ADMINISTRATION 100 each 1  . atorvastatin (LIPITOR) 20 MG  tablet TAKE 1 TABLET (20 MG TOTAL) BY MOUTH DAILY. 90 tablet 3  . insulin glargine (LANTUS) 100 UNIT/ML Solostar Pen INJECT 28 UNITS INTO THE SKIN AT BEDTIME. 9 mL 5  . metFORMIN (GLUCOPHAGE) 500 MG tablet TAKE 2 TABLETS (1,000 MG TOTAL) BY MOUTH 2 (TWO) TIMES DAILY WITH A MEAL. 270 tablet 1  . propranolol ER (INDERAL LA) 80 MG 24 hr capsule TAKE 1 CAPSULE BY MOUTH EVERY DAY 30 capsule 7  . ferrous sulfate 324 (65 Fe) MG TBEC Take 1 tablet (325 mg total) by mouth 2 (two) times daily. 180 tablet 1  . pantoprazole (PROTONIX) 20 MG tablet Take 1 tablet (20 mg total) by mouth daily. (Patient not taking: Reported on 07/29/2020) 30 tablet 6   No facility-administered medications prior to visit.    Allergies  Allergen Reactions  . Ace Inhibitors Shortness Of Breath    Reported by the patient after a trial of lisinopril  prescribes by PCP  . Aspirin Anaphylaxis  . Rocephin [Ceftriaxone] Itching  . Ciprofloxacin Itching  . Prednisone Palpitations       Objective:    BP (!) 145/88   Pulse 75   Resp 18   Ht $R'5\' 10"'fb$  (1.778 m)   Wt 206 lb 9.6 oz (93.7 kg)   SpO2 97%   BMI 29.64 kg/m  Wt Readings from Last 3 Encounters:  08/08/20 206 lb (93.4 kg)  07/29/20 206 lb 9.6 oz (93.7 kg)  07/02/20 201 lb 6.4 oz (91.4 kg)    Physical Exam Vitals and nursing note reviewed.  Constitutional:      Appearance: She is well-developed.  HENT:     Head: Normocephalic and atraumatic.  Cardiovascular:     Rate and Rhythm: Normal rate and regular rhythm.     Heart sounds: Normal heart sounds. No murmur heard. No friction rub. No gallop.   Pulmonary:     Effort: Pulmonary effort is normal. No tachypnea or respiratory distress.     Breath sounds: Normal breath sounds. No decreased breath sounds, wheezing, rhonchi or rales.  Chest:     Chest wall: No tenderness.  Abdominal:     General: Bowel sounds are normal.     Palpations: Abdomen is soft.  Musculoskeletal:        General: Normal range of motion.      Cervical back: Normal range of motion.  Skin:    General: Skin is warm and dry.  Neurological:     Mental Status: She is alert and oriented to person, place, and time.     Coordination: Coordination normal.  Psychiatric:        Behavior: Behavior normal. Behavior is cooperative.        Thought Content: Thought content normal.        Judgment: Judgment normal.          Patient has been counseled extensively about nutrition and exercise as well as the importance of adherence with medications and regular follow-up. The patient was given clear instructions to go to ER or return to medical center if symptoms don't improve, worsen or new problems develop. The patient verbalized understanding.   Follow-up: Return in about 6 weeks (around 09/09/2020) for meter check with [facility] See me in 3 months.   Gildardo Pounds, FNP-BC [facility] and [facility], [place]   08/09/2020, 1:44 AM

## 2020-07-30 ENCOUNTER — Other Ambulatory Visit: Payer: Self-pay

## 2020-07-30 LAB — CBC
Hematocrit: 30.6 % — ABNORMAL LOW (ref 34.0–46.6)
Hemoglobin: 10.6 g/dL — ABNORMAL LOW (ref 11.1–15.9)
MCH: 27.8 pg (ref 26.6–33.0)
MCHC: 34.6 g/dL (ref 31.5–35.7)
MCV: 80 fL (ref 79–97)
Platelets: 435 10*3/uL (ref 150–450)
RBC: 3.81 x10E6/uL (ref 3.77–5.28)
RDW: 13.6 % (ref 11.7–15.4)
WBC: 7.3 10*3/uL (ref 3.4–10.8)

## 2020-07-30 LAB — CMP14+EGFR
ALT: 20 IU/L (ref 0–32)
AST: 14 IU/L (ref 0–40)
Albumin/Globulin Ratio: 1.3 (ref 1.2–2.2)
Albumin: 4 g/dL (ref 3.8–4.8)
Alkaline Phosphatase: 69 IU/L (ref 44–121)
BUN/Creatinine Ratio: 12 (ref 9–23)
BUN: 9 mg/dL (ref 6–24)
Bilirubin Total: <0.2 mg/dL (ref 0.0–1.2)
CO2: 19 mmol/L — ABNORMAL LOW (ref 20–29)
Calcium: 9.8 mg/dL (ref 8.7–10.2)
Chloride: 106 mmol/L (ref 96–106)
Creatinine, Ser: 0.74 mg/dL (ref 0.57–1.00)
Globulin, Total: 3.2 g/dL (ref 1.5–4.5)
Glucose: 89 mg/dL (ref 65–99)
Potassium: 4.1 mmol/L (ref 3.5–5.2)
Sodium: 138 mmol/L (ref 134–144)
Total Protein: 7.2 g/dL (ref 6.0–8.5)
eGFR: 102 mL/min/{1.73_m2} (ref >59)

## 2020-07-30 LAB — LIPID PANEL
Chol/HDL Ratio: 3.6 ratio (ref 0.0–4.4)
Cholesterol, Total: 142 mg/dL (ref 100–199)
HDL: 39 mg/dL — ABNORMAL LOW (ref >39)
LDL Chol Calc (NIH): 89 mg/dL (ref 0–99)
Triglycerides: 67 mg/dL (ref 0–149)
VLDL Cholesterol Cal: 14 mg/dL (ref 5–40)

## 2020-07-30 LAB — MICROALBUMIN / CREATININE URINE RATIO
Creatinine, Urine: 152.5 mg/dL
Microalb/Creat Ratio: 12 mg/g creat (ref 0–29)
Microalbumin, Urine: 18.9 ug/mL

## 2020-07-31 ENCOUNTER — Other Ambulatory Visit (HOSPITAL_COMMUNITY): Payer: Medicaid Other

## 2020-07-31 ENCOUNTER — Telehealth (HOSPITAL_COMMUNITY): Payer: Self-pay | Admitting: General Practice

## 2020-07-31 NOTE — Telephone Encounter (Signed)
Patient called and cancelled echocardiogram due to her insurance is still pending coverage.  She was not able to be SELF PAY and will call back to reschedule when she has insurance. Order will be removed from the [facility] and when patient calls back to reschedule we will reinstate the order. Thank you

## 2020-08-01 ENCOUNTER — Other Ambulatory Visit: Payer: Self-pay

## 2020-08-05 ENCOUNTER — Ambulatory Visit: Payer: Self-pay | Admitting: Cardiovascular Disease

## 2020-08-05 ENCOUNTER — Other Ambulatory Visit: Payer: Self-pay | Admitting: Nurse Practitioner

## 2020-08-05 DIAGNOSIS — D649 Anemia, unspecified: Secondary | ICD-10-CM

## 2020-08-05 MED ORDER — FERROUS SULFATE 325 (65 FE) MG PO TABS
ORAL_TABLET | Freq: Two times a day (BID) | ORAL | 1 refills | Status: DC
  Filled 2020-08-05 – 2020-10-17 (×2): qty 60, 30d supply, fill #0
  Filled 2021-03-07: qty 60, 30d supply, fill #1

## 2020-08-06 ENCOUNTER — Other Ambulatory Visit: Payer: Self-pay

## 2020-08-08 ENCOUNTER — Emergency Department
Admission: EM | Admit: 2020-08-08 | Discharge: 2020-08-08 | Disposition: A | Discharge status: Home / Self Care | Payer: Medicaid Other | Attending: Emergency Medicine | Admitting: Emergency Medicine

## 2020-08-08 ENCOUNTER — Emergency Department: Payer: Medicaid Other

## 2020-08-08 ENCOUNTER — Other Ambulatory Visit: Payer: Self-pay

## 2020-08-08 ENCOUNTER — Encounter: Payer: Self-pay | Admitting: Emergency Medicine

## 2020-08-08 DIAGNOSIS — Z7984 Long term (current) use of oral hypoglycemic drugs: Secondary | ICD-10-CM | POA: Insufficient documentation

## 2020-08-08 DIAGNOSIS — I10 Essential (primary) hypertension: Secondary | ICD-10-CM | POA: Insufficient documentation

## 2020-08-08 DIAGNOSIS — M25562 Pain in left knee: Secondary | ICD-10-CM | POA: Insufficient documentation

## 2020-08-08 DIAGNOSIS — E119 Type 2 diabetes mellitus without complications: Secondary | ICD-10-CM | POA: Insufficient documentation

## 2020-08-08 DIAGNOSIS — Z79899 Other long term (current) drug therapy: Secondary | ICD-10-CM | POA: Insufficient documentation

## 2020-08-08 DIAGNOSIS — Z794 Long term (current) use of insulin: Secondary | ICD-10-CM | POA: Insufficient documentation

## 2020-08-08 MED ORDER — METHYLPREDNISOLONE 4 MG PO TBPK: ORAL_TABLET | ORAL | 0 refills | Status: DC

## 2020-08-08 NOTE — Discharge Instructions (Signed)
Follow-up with your regular doctor as needed.  Follow-up with orthopedics if not better in 4 days.  Return emergency department worsening Methylprednisolone should not give you the palpitations that prednisone does.  If you do feel that you are having palpitations stop taking the medication.

## 2020-08-08 NOTE — ED Notes (Signed)
See triage note.  Presents with pain to left knee  Denies any injury  No swelling

## 2020-08-08 NOTE — ED Triage Notes (Signed)
Pt to ED via POV with c/o L knee pain that started yesterday at 1600 that started all of a sudden and radiates down to L ankle. Pt states pain 9/10, now c/o difficulty walking.

## 2020-08-08 NOTE — ED Provider Notes (Signed)
[facility] Emergency Department Provider Note  ____________________________________________   Event Date/Time   First MD Initiated Contact with Patient 08/08/20 0801     (approximate)  I have reviewed the triage vital signs and the nursing notes.   HISTORY  Chief Complaint Knee Pain (/)    HPI Tonya Paul is a 45 y.o. female presents emergency department with left knee pain.  No known injury.  Patient is unable to straighten her left knee.  Symptoms started yesterday afternoon.  States pain radiates down to the ankle.  Unable to bear weight.    Past Medical History:  Diagnosis Date  . Diabetes mellitus without complication ([place])   . Hemorrhoids 11/18/2015  . Hypertension   . Left breast mass 09/01/2015  . 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 Palpitations 02/03/2016   PVCs seen on monitor  . Vertigo     Patient Active Problem List   Diagnosis Date Noted  . Elevated coronary artery calcium score 01/04/2020  . Hyperlipidemia 01/04/2020  . Essential hypertension 06/14/2019  . Dizziness 06/14/2019  . Abdominal pain, chronic, epigastric   . GERD (gastroesophageal reflux disease) 06/20/2017  . Long Q-T syndrome 01/14/2017  . Palpitations 02/03/2016  . Uterine fibroid 12/11/2015  . Family history of sudden cardiac death 2015/12/15  . Hemorrhoids 11/18/2015  . Left breast mass 09/01/2015  . Obesity 09/01/2015  . 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 BREAST BIOPSY Left 09/03/2015   [place] Core bx, benign  . ESOPHAGOGASTRODUODENOSCOPY (EGD) WITH PROPOFOL N/A 06/21/2017   Procedure: ESOPHAGOGASTRODUODENOSCOPY (EGD) WITH PROPOFOL;  Surgeon: Lin Landsman, MD;  Location: [place];  Service: Gastroenterology;  Laterality: N/A;    Prior to Admission medications   Medication Sig Start Date End Date Taking? Authorizing Provider  methylPREDNISolone  (MEDROL DOSEPAK) 4 MG TBPK tablet Take 6 pills on day one then decrease by 1 pill each day 08/08/20  Yes Labria Wos, Linden Dolin, PA-C  atorvastatin (LIPITOR) 20 MG tablet TAKE 1 TABLET (20 MG TOTAL) BY MOUTH DAILY. 07/29/20 07/29/21  Gildardo Pounds, NP  Blood Glucose Monitoring Suppl (TRUE METRIX METER) DEVI 1 each by Does not apply route 3 (three) times daily before meals. 04/16/15   Charlott Rakes, MD  Blood Pressure Monitor DEVI Please provide patient with insurance approved blood pressure monitor ICD10.0 11/23/19   Gildardo Pounds, NP  EPINEPHrine 0.3 mg/0.3 mL IJ SOAJ injection Inject 0.3 mLs (0.3 mg total) into the muscle as needed for anaphylaxis. 07/15/19   Rudene Re, MD  ferrous sulfate 325 (65 FE) MG tablet Take 1 tablet by mouth 2 (two) times daily. 08/05/20 11/03/20  Gildardo Pounds, NP  glucose blood (RELION GLUCOSE TEST STRIPS) test strip Use as instructed 07/30/16   Alfonse Spruce, FNP  insulin glargine (LANTUS) 100 UNIT/ML Solostar Pen Inject 30 Units into the skin at bedtime. Increase lantus to 30 units nightly. Increase by 2 units every 3 days for blood glucose level greater than 140. 07/29/20 10/27/20  Gildardo Pounds, NP  Insulin Pen Needle 31G X 5 MM MISC USE AS INSTRUCTED 04/30/20 04/30/21  Argentina Donovan, PA-C  Insulin Syringe-Needle U-100 (B-D INS SYRINGE 0.5CC/31GX5/16) 31G X 5/16" 0.5 ML MISC USE AS DIRECTED DAILY FOR INSULIN ADMINISTRATION 11/29/16   Alfonse Spruce, FNP  metFORMIN (GLUCOPHAGE) 500 MG tablet TAKE 2 TABLETS (1,000 MG TOTAL) BY MOUTH 2 (TWO) TIMES  DAILY WITH A MEAL. 07/29/20 07/29/21  Gildardo Pounds, NP  propranolol ER (INDERAL LA) 80 MG 24 hr capsule TAKE 1 CAPSULE BY MOUTH EVERY DAY 07/29/20 10/27/20  Gildardo Pounds, NP  lisinopril (ZESTRIL) 5 MG tablet Take 1 tablet (5 mg total) by mouth daily. 01/04/20 03/01/20  Lorretta Harp, MD    Allergies Ace inhibitors, Aspirin, Rocephin [ceftriaxone], Ciprofloxacin, and Prednisone  Family History  Problem  Relation Age of Onset  . Hypertension Mother   . Lung cancer Mother   . Throat cancer Father   . Diabetes Sister   . Lupus Maternal Grandmother   . Stroke Maternal Grandfather   . Sudden Cardiac Death Daughter   . Breast cancer Paternal Aunt     Social History Social History   Tobacco Use  . Smoking status: Never Smoker  . Smokeless tobacco: Never Used  Vaping Use  . Vaping Use: Never used  Substance Use Topics  . Alcohol use: No  . Drug use: No    Review of Systems  Constitutional: No fever/chills Eyes: No visual changes. ENT: No sore throat. Respiratory: Denies cough Cardiovascular: Denies chest pain Gastrointestinal: Denies abdominal pain Genitourinary: Negative for dysuria. Musculoskeletal: Negative for back pain.  Positive for left knee pain Skin: Negative for rash. Psychiatric: no mood changes,     ____________________________________________   PHYSICAL EXAM:  VITAL SIGNS: ED Triage Vitals  Enc Vitals Group     BP 08/08/20 0805 (!) 148/108     Pulse Rate 08/08/20 0805 79     Resp 08/08/20 0805 16     Temp 08/08/20 0805 98.5 F (36.9 C)     Temp Source 08/08/20 0805 Oral     SpO2 08/08/20 0805 100 %     Weight 08/08/20 0802 206 lb 9.1 oz (93.7 kg)     Height 08/08/20 0802 5\' 10"  (1.778 m)     Head Circumference --      Peak Flow --      Pain Score 08/08/20 0758 9     Pain Loc --      Pain Edu? --      Excl. in [place]? --     Constitutional: Alert and oriented. Well appearing and in no acute distress. Eyes: Conjunctivae are normal.  Head: Atraumatic. Nose: No congestion/rhinnorhea. Mouth/Throat: Mucous membranes are moist.   Neck:  supple no lymphadenopathy noted Cardiovascular: Normal rate, regular rhythm.  Respiratory: Normal respiratory effort.  No retractions,  GU: deferred Musculoskeletal: Patient is not able to fully extend the left knee, neurovascular is intact  neurologic:  Normal speech and language.  Skin:  Skin is warm, dry and  intact. No rash noted. Psychiatric: Mood and affect are normal. Speech and behavior are normal.  ____________________________________________   LABS (all labs ordered are listed, but only abnormal results are displayed)  Labs Reviewed - No data to display ____________________________________________   ____________________________________________  RADIOLOGY  X-ray of the left knee  ____________________________________________   PROCEDURES  Procedure(s) performed: Knee immobilizer and crutches applied by the tech  Procedures    ____________________________________________   INITIAL IMPRESSION / ASSESSMENT AND PLAN / ED COURSE  Pertinent labs & imaging results that were available during my care of the patient were reviewed by me and considered in my medical decision making (see chart for details).   Patient's 45 year old female presents for left knee pain.  See HPI.  Physical exam shows patient per stable.  X-ray of the left knee reviewed by me and  confirmed by radiology is negative for any acute abnormality.  Did explain the findings to the patient.  She is placed in a knee immobilizer and given crutches.  Medrol Dosepak to decrease inflammation.  Patient is allergic to aspirin and ibuprofen.  She is to follow-up with orthopedics.  Return emergency department worsening.  Is discharged stable condition     Tonya Paul was evaluated in Emergency Department on 08/08/2020 for the symptoms described in the history of present illness. She was evaluated in the context of the global COVID-19 pandemic, which necessitated consideration that the patient might be at risk for infection with the SARS-CoV-2 virus that causes COVID-19. Institutional protocols and algorithms that pertain to the evaluation of patients at risk for COVID-19 are in a state of rapid change based on information released by regulatory bodies including the CDC and federal and state organizations. These policies and  algorithms were followed during the patient's care in the ED.    As part of my medical decision making, I reviewed the following data within the [place] notes reviewed and incorporated, Old chart reviewed, Radiograph reviewed , Notes from prior ED visits and [place] Controlled Substance Database  ____________________________________________   FINAL CLINICAL IMPRESSION(S) / ED DIAGNOSES  Final diagnoses:  Acute pain of left knee      NEW MEDICATIONS STARTED DURING THIS VISIT:  New Prescriptions   METHYLPREDNISOLONE (MEDROL DOSEPAK) 4 MG TBPK TABLET    Take 6 pills on day one then decrease by 1 pill each day     Note:  This document was prepared using Dragon voice recognition software and may include unintentional dictation errors.    Versie Starks, PA-C 08/08/20 Emmaline Life    Lavonia Drafts, MD 08/08/20 1002

## 2020-08-09 ENCOUNTER — Encounter: Payer: Self-pay | Admitting: Nurse Practitioner

## 2020-08-13 ENCOUNTER — Other Ambulatory Visit: Payer: Self-pay

## 2020-09-02 ENCOUNTER — Ambulatory Visit: Payer: Self-pay | Admitting: *Deleted

## 2020-09-02 NOTE — Telephone Encounter (Signed)
Reason for Disposition  [1] MODERATE leg swelling (e.g., swelling extends up to knees) AND [2] new-onset or worsening  Answer Assessment - Initial Assessment Questions 1. ONSET: "When did the swelling start?" (e.g., minutes, hours, days)     Today- knee to toes- left 2. LOCATION: "What part of the leg is swollen?"  "Are both legs swollen or just one leg?"     Knee to toes- left leg 3. SEVERITY: "How bad is the swelling?" (e.g., localized; mild, moderate, severe)  - Localized - small area of swelling localized to one leg  - MILD pedal edema - swelling limited to foot and ankle, pitting edema < 1/4 inch (6 mm) deep, rest and elevation eliminate most or all swelling  - MODERATE edema - swelling of lower leg to knee, pitting edema > 1/4 inch (6 mm) deep, rest and elevation only partially reduce swelling  - SEVERE edema - swelling extends above knee, facial or hand swelling present      moderate 4. REDNESS: "Does the swelling look red or infected?"     no 5. PAIN: "Is the swelling painful to touch?" If Yes, ask: "How painful is it?"   (Scale 1-10; mild, moderate or severe)     Mild/moderate 6. FEVER: "Do you have a fever?" If Yes, ask: "What is it, how was it measured, and when did it start?"      no 7. CAUSE: "What do you think is causing the leg swelling?"     Unsure- no injury known 8. MEDICAL HISTORY: "Do you have a history of heart failure, kidney disease, liver failure, or cancer?"     No- heart condition 9. RECURRENT SYMPTOM: "Have you had leg swelling before?" If Yes, ask: "When was the last time?" "What happened that time?"     1 month ago- she was treated for inflammation 10. OTHER SYMPTOMS: "Do you have any other symptoms?" (e.g., chest pain, difficulty breathing)       No- last night palpations 11. PREGNANCY: "Is there any chance you are pregnant?" "When was your last menstrual period?"       No- LMP- beginning of June  Protocols used: Leg Swelling and Edema-A-AH

## 2020-09-02 NOTE — Telephone Encounter (Signed)
Patient is calling to report she has swelling in left leg- from knee to toes- worse than last time- 1 month ago. Patient states she was seen at ED and treated for acute knee pain with prednisone and it got better. Patient does not remember injuring her knee. Advised appointment within 24 hours- she wants to be seen today so she is going to mobile unit.

## 2020-09-03 ENCOUNTER — Other Ambulatory Visit: Payer: Self-pay

## 2020-09-03 ENCOUNTER — Emergency Department
Admission: EM | Admit: 2020-09-03 | Discharge: 2020-09-03 | Disposition: A | Discharge status: Home / Self Care | Payer: Medicaid Other | Attending: Emergency Medicine | Admitting: Emergency Medicine

## 2020-09-03 ENCOUNTER — Encounter: Payer: Self-pay | Admitting: Emergency Medicine

## 2020-09-03 DIAGNOSIS — Z794 Long term (current) use of insulin: Secondary | ICD-10-CM | POA: Insufficient documentation

## 2020-09-03 DIAGNOSIS — I10 Essential (primary) hypertension: Secondary | ICD-10-CM | POA: Insufficient documentation

## 2020-09-03 DIAGNOSIS — Z79899 Other long term (current) drug therapy: Secondary | ICD-10-CM | POA: Insufficient documentation

## 2020-09-03 DIAGNOSIS — Z7984 Long term (current) use of oral hypoglycemic drugs: Secondary | ICD-10-CM | POA: Insufficient documentation

## 2020-09-03 DIAGNOSIS — E119 Type 2 diabetes mellitus without complications: Secondary | ICD-10-CM | POA: Insufficient documentation

## 2020-09-03 DIAGNOSIS — M25562 Pain in left knee: Secondary | ICD-10-CM | POA: Insufficient documentation

## 2020-09-03 MED ORDER — KETOROLAC TROMETHAMINE 30 MG/ML IJ SOLN
30.0000 mg | Freq: Once | INTRAMUSCULAR | Status: AC
  Administered 2020-09-03: 30 mg via INTRAMUSCULAR
  Filled 2020-09-03: qty 1

## 2020-09-03 MED ORDER — MELOXICAM 15 MG PO TABS: 15.0000 mg | ORAL_TABLET | Freq: Every day | ORAL | 0 refills | Status: DC

## 2020-09-03 NOTE — ED Notes (Signed)
See triage note  Presents with left knee/ leg swelling    States discomfort is in joint  Has had cortisone shot and had some relief

## 2020-09-03 NOTE — ED Provider Notes (Signed)
[facility] Emergency Department Provider Note  ____________________________________________  Time seen: Approximately 8:41 PM  I have reviewed the triage vital signs and the nursing notes.   HISTORY  Chief Complaint Joint Swelling    HPI Tonya Paul is a 45 y.o. female who presents the emergency department complaining of left knee pain.  Patient was evaluated in this department several weeks ago for similar complaint.  She states at that time they had given her a knee immobilizer and crutches.  She states that having immobilize seem to improve symptoms but she had been using over-the-counter medication for anti-inflammatory control.  She stopped the medicine and the brace when her knee was feeling better and it has returned at this time.  No direct trauma to the area.  Pain is only in the kneecap itself.  No other injury or complaint.       Past Medical History:  Diagnosis Date   Diabetes mellitus without complication ([place])    Hemorrhoids 11/18/2015   Hypertension    Left breast mass 09/01/2015   Long Q-T syndrome 01/14/2017   Long QT syndrome   Palpitations 02/03/2016   PVCs seen on monitor   Vertigo     Patient Active Problem List   Diagnosis Date Noted   Elevated coronary artery calcium score 01/04/2020   Hyperlipidemia 01/04/2020   Essential hypertension 06/14/2019   Dizziness 06/14/2019   Abdominal pain, chronic, epigastric    GERD (gastroesophageal reflux disease) 06/20/2017   Long Q-T syndrome 01/14/2017   Palpitations 02/03/2016   Uterine fibroid 12/11/2015   Family history of sudden cardiac death December 22, 2015   Hemorrhoids 11/18/2015   Left breast mass 09/01/2015   Obesity 09/01/2015   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 Authorizing Provider  meloxicam (MOBIC) 15 MG tablet Take 1 tablet (15 mg total) by mouth daily. 09/03/20  Yes Andrae Claunch, Charline Bills, PA-C  atorvastatin (LIPITOR) 20 MG tablet TAKE 1 TABLET (20 MG TOTAL) BY MOUTH DAILY. 07/29/20 07/29/21  Gildardo Pounds, NP  Blood Glucose Monitoring Suppl (TRUE METRIX METER) DEVI 1 each by Does not apply route 3 (three) times daily before meals. 04/16/15   Charlott Rakes, MD  Blood Pressure Monitor DEVI Please provide patient with insurance approved blood pressure monitor ICD10.0 11/23/19   Gildardo Pounds, NP  EPINEPHrine 0.3 mg/0.3 mL IJ SOAJ injection Inject 0.3 mLs (0.3 mg total) into the muscle as needed for anaphylaxis. 07/15/19   Rudene Re, MD  ferrous sulfate 325 (65 FE) MG tablet Take 1 tablet by mouth 2 (two) times daily. 08/05/20 11/03/20  Gildardo Pounds, NP  glucose blood (RELION GLUCOSE TEST STRIPS) test strip Use as instructed 07/30/16   Alfonse Spruce, FNP  insulin glargine (LANTUS) 100 UNIT/ML Solostar Pen Inject 30 Units into the skin at bedtime. Increase lantus to 30 units nightly. Increase by 2 units every 3 days for blood glucose level greater than 140. 07/29/20 10/27/20  Gildardo Pounds, NP  Insulin Pen Needle 31G X 5 MM MISC USE AS INSTRUCTED 04/30/20 04/30/21  Argentina Donovan, PA-C  Insulin Syringe-Needle U-100 (B-D INS SYRINGE 0.5CC/31GX5/16) 31G X 5/16" 0.5 ML MISC USE AS DIRECTED DAILY FOR INSULIN ADMINISTRATION 11/29/16   Alfonse Spruce, FNP  metFORMIN (GLUCOPHAGE) 500 MG tablet TAKE 2 TABLETS (1,000 MG TOTAL) BY MOUTH 2 (TWO) TIMES DAILY WITH A MEAL. 07/29/20 07/29/21  Gildardo Pounds, NP  methylPREDNISolone (MEDROL DOSEPAK) 4 MG TBPK tablet Take 6 pills on day one then decrease by 1 pill each day  08/08/20   Versie Starks, PA-C  propranolol ER (INDERAL LA) 80 MG 24 hr capsule TAKE 1 CAPSULE BY MOUTH EVERY DAY 07/29/20 10/27/20  Gildardo Pounds, NP  lisinopril (ZESTRIL) 5 MG tablet Take 1 tablet (5 mg total) by mouth daily. 01/04/20 03/01/20  Lorretta Harp, MD    Allergies Ace inhibitors, Aspirin, Rocephin [ceftriaxone], Ciprofloxacin, and Prednisone  Family History  Problem Relation Age of Onset   Hypertension Mother    Lung cancer Mother    Throat cancer Father    Diabetes Sister    Lupus Maternal Grandmother    Stroke Maternal Grandfather    Sudden Cardiac Death Daughter    Breast cancer Paternal Aunt     Social History Social History   Tobacco Use   Smoking status: Never   Smokeless tobacco: Never  Vaping Use   Vaping Use: Never used  Substance Use Topics   Alcohol use: No   Drug use: No     Review of Systems  Constitutional: No fever/chills Eyes: No visual changes. No discharge ENT: No upper respiratory complaints. Cardiovascular: no chest pain. Respiratory: no cough. No SOB. Gastrointestinal: No abdominal pain.  No nausea, no vomiting.  No diarrhea.  No constipation. Musculoskeletal: Positive for left knee/left knee Pain Skin: Negative for rash, abrasions, lacerations, ecchymosis. Neurological: Negative for headaches, focal weakness or numbness.  10 System ROS otherwise negative.  ____________________________________________   PHYSICAL EXAM:  VITAL SIGNS: ED Triage Vitals  Enc Vitals Group     BP 09/03/20 1803 (!) 142/87     Pulse Rate 09/03/20 1803 81     Resp 09/03/20 1803 16     Temp 09/03/20 1803 99 F (37.2 C)     Temp Source 09/03/20 1803 Oral     SpO2 09/03/20 1803 99 %     Weight 09/03/20 1804 190 lb (86.2 kg)     Height 09/03/20 1804 5\' 10"  (1.778 m)     Head Circumference --      Peak Flow --      Pain Score 09/03/20 1803 8     Pain Loc --      Pain Edu? --      Excl. in [place]? --      Constitutional: Alert and oriented.  Well appearing and in no acute distress. Eyes: Conjunctivae are normal. PERRL. EOMI. Head: Atraumatic. ENT:      Ears:       Nose: No congestion/rhinnorhea.      Mouth/Throat: Mucous membranes are moist.  Neck: No stridor.    Cardiovascular: Normal rate, regular rhythm. Normal S1 and S2.  Good peripheral circulation. Respiratory: Normal respiratory effort without tachypnea or retractions. Lungs CTAB. Good air entry to the bases with no decreased or absent breath sounds. Musculoskeletal: Full range of motion to all extremities. No gross deformities appreciated.  Visualization of the left knee reveals no gross erythema, ecchymosis, abrasions or lacerations.  Good range of motion currently.  Patient is tender over the patella itself.  Compression of the patella elicits patient's  pain but palpation of the surrounding structures including the quadriceps ligament, patellar tendon revealed no tenderness.  Special tests of the knee to include Lachman's, varus and valgus is negative.  Dorsalis pedis pulses sensation intact distally. Neurologic:  Normal speech and language. No gross focal neurologic deficits are appreciated.  Skin:  Skin is warm, dry and intact. No rash noted. Psychiatric: Mood and affect are normal. Speech and behavior are normal. Patient exhibits appropriate insight and judgement.   ____________________________________________   LABS (all labs ordered are listed, but only abnormal results are displayed)  Labs Reviewed - No data to display ____________________________________________  EKG   ____________________________________________  RADIOLOGY  The x-ray from 08/08/2020 was reviewed at this time.  No acute findings on x-ray.  ____________________________________________    PROCEDURES  Procedure(s) performed:    Procedures    Medications  ketorolac (TORADOL) 30 MG/ML injection 30 mg (has no administration in time range)      ____________________________________________   INITIAL IMPRESSION / ASSESSMENT AND PLAN / ED COURSE  Pertinent labs & imaging results that were available during my care of the patient were reviewed by me and considered in my medical decision making (see chart for details).  Review of the [place] CSRS was performed in accordance of the [place] prior to dispensing any controlled drugs.           Patient's diagnosis is consistent with patellofemoral syndrome.  Patient presented to emergency department complaining of left knee pain.  She was seen in this department roughly 3 weeks ago for similar complaint.  She been placed in a knee immobilizer and on crutches.  Patient been taking over-the-counter anti-inflammatories and had good improvement of her symptoms.  After stopping these treatments, she has had a return of the knee pain.  Findings are consistent with tenderness over the patella itself.  No injury since last evaluation.  Imaging from 3 weeks ago was reviewed today and reveal no acute findings.  I will not repeat imaging at this time as there is no new injury.  Findings are consistent with patellofemoral syndrome and patient will be given hinged knee brace and Toradol injection at this time.  Patient will be placed on meloxicam at home.  Follow-up with orthopedics if this is not alleviating symptoms.  Return precautions are discussed with the patient at this time. Patient is given ED precautions to return to the ED for any worsening or new symptoms.     ____________________________________________  FINAL CLINICAL IMPRESSION(S) / ED DIAGNOSES  Final diagnoses:  Pain of left patellofemoral joint      NEW MEDICATIONS STARTED DURING THIS VISIT:  ED Discharge Orders          Ordered    meloxicam (MOBIC) 15 MG tablet  Daily        09/03/20 2103                This chart was dictated using voice recognition software/Dragon. Despite best efforts to proofread, errors can  occur which can change the meaning. Any change was purely unintentional.    Brynda Peon 09/03/20 2103    Nance Pear, MD 09/03/20 2125

## 2020-09-03 NOTE — ED Triage Notes (Signed)
Pt comes into the ED via POV c/o left knee swelling.  PT states she was seen for a similar situation couple weeks ago where she woke up and couldn't bend that knee. She was seen here and given a steroid injection.  Pt states it went away but now the pain has returned and now she has swelling in the joint as well.  Pt denies any known history of gout.  PT in NAD at this time with even and unlabored respirations.

## 2020-09-04 ENCOUNTER — Telehealth: Payer: Self-pay | Admitting: Nurse Practitioner

## 2020-09-04 NOTE — Telephone Encounter (Signed)
Pt was called VM was left for Pt to call and FU on most resent ED visit

## 2020-09-09 ENCOUNTER — Other Ambulatory Visit: Payer: Self-pay

## 2020-09-09 ENCOUNTER — Ambulatory Visit: Payer: Medicaid Other | Attending: Nurse Practitioner | Admitting: Pharmacist

## 2020-09-09 ENCOUNTER — Encounter: Payer: Self-pay | Admitting: Pharmacist

## 2020-09-09 DIAGNOSIS — Z794 Long term (current) use of insulin: Secondary | ICD-10-CM

## 2020-09-09 DIAGNOSIS — E119 Type 2 diabetes mellitus without complications: Secondary | ICD-10-CM

## 2020-09-09 LAB — GLUCOSE, POCT (MANUAL RESULT ENTRY): POC Glucose: 189 mg/dl — AB (ref 70–99)

## 2020-09-09 MED ORDER — TRULICITY 0.75 MG/0.5ML ~~LOC~~ SOAJ
0.7500 mg | SUBCUTANEOUS | 0 refills | Status: DC
  Filled 2020-09-09: qty 2, 28d supply, fill #0

## 2020-09-09 MED ORDER — INSULIN GLARGINE 100 UNIT/ML SOLOSTAR PEN
28.0000 [IU] | PEN_INJECTOR | Freq: Every day | SUBCUTANEOUS | 1 refills | Status: DC
  Filled 2020-09-09: qty 9, 28d supply, fill #0
  Filled 2020-10-17: qty 9, 28d supply, fill #1

## 2020-09-09 NOTE — Progress Notes (Signed)
    S:     No chief complaint on file.  Patient arrives in good spirits. Presents for diabetes evaluation, education, and management. Patient was referred and last seen by Primary Care Provider on 07/29/2020. Lantus was increased to 30u daily at that visit.   Family/Social History:  -Fhx: HTN, DM, stroke, sudden cardiac death  -Tobacco: never -Alcohol: none  Insurance coverage/medication affordability: Dnbi  Medication adherence reported, however, she takes differently than directed.    Current diabetes medications include: metformin 1000 mg BID (takes metformin 500 mg TID), Lantus 30 units daily (pt is taking 32u) Current hyperlipidemia medications include: atorvastatin 20 mg daily   Patient reports hypoglycemic events. Gives objective values in the 60-70s. Treats successfully.   Patient reported dietary habits:  -Juicing; has successfully cut out sweets   Patient-reported exercise habits:  -none reported   Patient denies nocturia (nighttime urination).  Patient denies neuropathy (nerve pain). Patient denies visual changes. Patient reports self foot exams.     O:  POCT: 184 (fasting)  Lab Results  Component Value Date   HGBA1C 8.9 (A) 07/29/2020   There were no vitals filed for this visit.  Lipid Panel     Component Value Date/Time   CHOL 142 07/29/2020 1600   TRIG 67 07/29/2020 1600   HDL 39 (L) 07/29/2020 1600   CHOLHDL 3.6 07/29/2020 1600   CHOLHDL 3.6 04/18/2015 0936   VLDL 13 04/18/2015 0936   LDLCALC 89 07/29/2020 1600   Home fasting blood sugars: 60s-low 100s  2 hour post-meal/random blood sugars: none. No meter with her.   Clinical Atherosclerotic Cardiovascular Disease (ASCVD): No  The 10-year ASCVD risk score Mikey Bussing DC Jr., et al., 2013) is: 11.6%   Values used to calculate the score:     Age: 45 years     Sex: Female     Is Non-Hispanic African American: Yes     Diabetic: Yes     Tobacco smoker: No     Systolic Blood Pressure: 078 mmHg     Is  BP treated: Yes     HDL Cholesterol: 39 mg/dL     Total Cholesterol: 142 mg/dL   A/P: Diabetes longstanding currently uncontrolled. Patient is able to verbalize appropriate hypoglycemia management plan. Medication adherence appears suboptimal. She is interested in trying Trulicity.  -Started Trulicity 6.75 mg weekly.  -Patient was educated on the use of the Trulicity pen. -Decrease Lantus to 28 units. Pt advised to decrease further to 25u if hypoglycemia persists.  -Continued metformin at current dosing.  -Extensively discussed pathophysiology of diabetes, recommended lifestyle interventions, dietary effects on blood sugar control -Counseled on s/sx of and management of hypoglycemia -Next A1C anticipated 10/2020.   Written patient instructions provided.  Total time in face to face counseling 30 minutes.   Follow up Pharmacist Clinic Visit in 1 month.   Benard Halsted, PharmD, [facility], [place] [phone]

## 2020-09-10 ENCOUNTER — Ambulatory Visit (INDEPENDENT_AMBULATORY_CARE_PROVIDER_SITE_OTHER): Payer: Medicaid Other | Admitting: Nurse Practitioner

## 2020-09-10 VITALS — BP 129/84 | HR 92 | Temp 97.9°F | Resp 18

## 2020-09-10 DIAGNOSIS — M25562 Pain in left knee: Secondary | ICD-10-CM | POA: Insufficient documentation

## 2020-09-10 NOTE — Assessment & Plan Note (Signed)
Continue knee brace - may try sleeve  Ice packs for comfort  Rest leg and elevate when possible  Will place referral to ortho  May continue Mobic    Follow up:  Follow up with PCP when scheduled

## 2020-09-10 NOTE — Patient Instructions (Addendum)
Patellofemoral syndrome:  Continue knee brace - may try sleeve  Ice packs for comfort  Rest leg and elevate when possible  Will place referral to ortho  May continue Mobic    Follow up:  Follow up with PCP when scheduled

## 2020-09-10 NOTE — Progress Notes (Signed)
@Patient  ID: Tonya Paul, female    DOB: 1975/08/11, 45 y.o.   MRN: [id number]  Chief Complaint  Patient presents with   Hospitalization Follow-up     Referring provider: Gildardo Pounds, NP   HPI  Patient presents today for transition of care visit.  Patient was seen in the ED on 09/03/2020.  She was diagnosed with patellofemoral syndrome.  She was also seen previously in the ED 3 weeks prior for the same symptoms of left knee pain with swelling.  X-ray of knee was negative.  Patient was given a Toradol injection and prescribed Mobic.  She was given a knee brace.  She states that overall she has been doing much better since starting on the Mobic.  She did not have knee brace on in office today.  She does still have slight swelling to anterior left knee. Denies f/c/s, n/v/d, hemoptysis, PND, leg chest pain or edema.      Allergies  Allergen Reactions   Ace Inhibitors Shortness Of Breath    Reported by the patient after a trial of lisinopril prescribes by PCP   Aspirin Anaphylaxis   Rocephin [Ceftriaxone] Itching   Ciprofloxacin Itching   Prednisone Palpitations    Immunization History  Administered Date(s) Administered   Influenza-Unspecified 12/31/2016   Pneumococcal Conjugate-13 07/15/2016   Pneumococcal Polysaccharide-23 05/15/2018   Tdap 05/15/2018    Past Medical History:  Diagnosis Date   Diabetes mellitus without complication ([place])    Hemorrhoids 11/18/2015   Hypertension    Left breast mass 09/01/2015   Long Q-T syndrome 01/14/2017   Long QT syndrome   Palpitations 02/03/2016   PVCs seen on monitor   Vertigo     Tobacco History: Social History   Tobacco Use  Smoking Status Never  Smokeless Tobacco Never   Counseling given: Yes   Outpatient Encounter Medications as of 09/10/2020  Medication Sig   atorvastatin (LIPITOR) 20 MG tablet TAKE 1 TABLET (20 MG TOTAL) BY MOUTH DAILY.   Blood Glucose Monitoring Suppl (TRUE METRIX METER) DEVI 1 each by Does  not apply route 3 (three) times daily before meals.   Blood Pressure Monitor DEVI Please provide patient with insurance approved blood pressure monitor ICD10.0   Dulaglutide (TRULICITY) 6.30 ZS/0.1UX SOPN Inject 0.75 mg into the skin once a week.   EPINEPHrine 0.3 mg/0.3 mL IJ SOAJ injection Inject 0.3 mLs (0.3 mg total) into the muscle as needed for anaphylaxis.   ferrous sulfate 325 (65 FE) MG tablet Take 1 tablet by mouth 2 (two) times daily.   glucose blood (RELION GLUCOSE TEST STRIPS) test strip Use as instructed   insulin glargine (LANTUS) 100 UNIT/ML Solostar Pen Inject 28 Units into the skin at bedtime. Increase lantus to 30 units nightly. Increase by 2 units every 3 days for blood glucose level greater than 140.   Insulin Pen Needle 31G X 5 MM MISC USE AS INSTRUCTED   Insulin Syringe-Needle U-100 (B-D INS SYRINGE 0.5CC/31GX5/16) 31G X 5/16" 0.5 ML MISC USE AS DIRECTED DAILY FOR INSULIN ADMINISTRATION   meloxicam (MOBIC) 15 MG tablet Take 1 tablet (15 mg total) by mouth daily.   metFORMIN (GLUCOPHAGE) 500 MG tablet TAKE 2 TABLETS (1,000 MG TOTAL) BY MOUTH 2 (TWO) TIMES DAILY WITH A MEAL.   methylPREDNISolone (MEDROL DOSEPAK) 4 MG TBPK tablet Take 6 pills on day one then decrease by 1 pill each day   propranolol ER (INDERAL LA) 80 MG 24 hr capsule TAKE 1 CAPSULE BY MOUTH EVERY DAY   [  DISCONTINUED] lisinopril (ZESTRIL) 5 MG tablet Take 1 tablet (5 mg total) by mouth daily.   No facility-administered encounter medications on file as of 09/10/2020.     Review of Systems  Review of Systems  Constitutional: Negative.   HENT: Negative.    Respiratory:  Negative for cough and shortness of breath.   Cardiovascular: Negative.   Gastrointestinal: Negative.   Musculoskeletal:  Positive for arthralgias and joint swelling.  Allergic/Immunologic: Negative.   Neurological: Negative.   Psychiatric/Behavioral: Negative.        Physical Exam  BP 129/84   Pulse 92   Temp 97.9 F (36.6 C)    Resp 18   SpO2 100%   Wt Readings from Last 5 Encounters:  09/03/20 190 lb (86.2 kg)  08/08/20 206 lb (93.4 kg)  07/29/20 206 lb 9.6 oz (93.7 kg)  07/02/20 201 lb 6.4 oz (91.4 kg)  04/23/20 182 lb (82.6 kg)     Physical Exam Vitals and nursing note reviewed.  Constitutional:      General: She is not in acute distress.    Appearance: She is well-developed.  Cardiovascular:     Rate and Rhythm: Normal rate and regular rhythm.  Pulmonary:     Effort: Pulmonary effort is normal.     Breath sounds: Normal breath sounds.  Musculoskeletal:     Right lower leg: No edema.     Left lower leg: No edema.  Neurological:     Mental Status: She is alert and oriented to person, place, and time.  Psychiatric:        Mood and Affect: Mood normal.        Behavior: Behavior normal.     Lab Results:  CBC    Component Value Date/Time   WBC 7.3 07/29/2020 1600   WBC 6.8 04/22/2020 1932   RBC 3.81 07/29/2020 1600   RBC 4.00 04/22/2020 1932   HGB 10.6 (L) 07/29/2020 1600   HCT 30.6 (L) 07/29/2020 1600   PLT 435 07/29/2020 1600   MCV 80 07/29/2020 1600   MCH 27.8 07/29/2020 1600   MCH 27.8 04/22/2020 1932   MCHC 34.6 07/29/2020 1600   MCHC 32.7 04/22/2020 1932   RDW 13.6 07/29/2020 1600   LYMPHSABS 2.8 01/05/2019 1124   MONOABS 0.8 05/05/2018 0915   EOSABS 0.3 01/05/2019 1124   BASOSABS 0.0 01/05/2019 1124    BMET    Component Value Date/Time   NA 138 07/29/2020 1600   K 4.1 07/29/2020 1600   CL 106 07/29/2020 1600   CO2 19 (L) 07/29/2020 1600   GLUCOSE 89 07/29/2020 1600   GLUCOSE 151 (H) 04/22/2020 1932   BUN 9 07/29/2020 1600   CREATININE 0.74 07/29/2020 1600   CREATININE 0.84 04/18/2015 0936   CALCIUM 9.8 07/29/2020 1600   GFRNONAA >60 04/22/2020 1932   GFRNONAA 88 04/18/2015 0936   GFRAA >60 12/13/2019 0751   GFRAA >89 04/18/2015 0936      Assessment & Plan:   Pain of left patellofemoral joint Continue knee brace - may try sleeve  Ice packs for  comfort  Rest leg and elevate when possible  Will place referral to ortho  May continue Mobic    Follow up:  Follow up with PCP when scheduled     Fenton Foy, NP 09/10/2020

## 2020-09-11 ENCOUNTER — Other Ambulatory Visit: Payer: Self-pay

## 2020-09-15 ENCOUNTER — Ambulatory Visit: Payer: Medicaid Other | Admitting: Orthopedic Surgery

## 2020-10-07 ENCOUNTER — Ambulatory Visit: Payer: Self-pay | Attending: Nurse Practitioner | Admitting: Pharmacist

## 2020-10-07 ENCOUNTER — Other Ambulatory Visit: Payer: Self-pay

## 2020-10-07 ENCOUNTER — Encounter: Payer: Self-pay | Admitting: Pharmacist

## 2020-10-07 DIAGNOSIS — Z794 Long term (current) use of insulin: Secondary | ICD-10-CM

## 2020-10-07 DIAGNOSIS — E119 Type 2 diabetes mellitus without complications: Secondary | ICD-10-CM

## 2020-10-07 LAB — GLUCOSE, POCT (MANUAL RESULT ENTRY): POC Glucose: 146 mg/dl — AB (ref 70–99)

## 2020-10-07 NOTE — Progress Notes (Signed)
    S:    PCP: Zelda  No chief complaint on file.  Patient arrives in good spirits. Presents for diabetes evaluation, education, and management. Patient was referred and last seen by Primary Care Provider on 07/29/2020. Lantus was increased to 30u daily at that visit. We saw her on 0/30/0923 and started Trulicity.   Family/Social History:  -Fhx: HTN, DM, stroke, sudden cardiac death  -Tobacco: never -Alcohol: none  Insurance coverage/medication affordability: Dnbi  Medication adherence reported. Current diabetes medications include: metformin 1000 mg BID (takes two 500 mg tablets BID), Trulicity 3.00 mg weekly (she only started it last week), Lantus 26 units daily  Patient denies hypoglycemic events.   Patient reported dietary habits:  -Juicing; has successfully cut out sweets   Patient-reported exercise habits:  -none reported   Patient denies nocturia (nighttime urination).  Patient denies neuropathy (nerve pain). Patient denies visual changes. Patient reports self foot exams.     O:  POCT: 146  Lab Results  Component Value Date   HGBA1C 8.9 (A) 07/29/2020   There were no vitals filed for this visit.  Lipid Panel     Component Value Date/Time   CHOL 142 07/29/2020 1600   TRIG 67 07/29/2020 1600   HDL 39 (L) 07/29/2020 1600   CHOLHDL 3.6 07/29/2020 1600   CHOLHDL 3.6 04/18/2015 0936   VLDL 13 04/18/2015 0936   LDLCALC 89 07/29/2020 1600   Home fasting blood sugars: has not checked her sugar much since her last visit. She moved and has not located her meter.  2 hour post-meal/random blood sugars: none. No meter with her.   Clinical Atherosclerotic Cardiovascular Disease (ASCVD): No  The 10-year ASCVD risk score Mikey Bussing DC Jr., et al., 2013) is: 7.4%   Values used to calculate the score:     Age: 45 years     Sex: Female     Is Non-Hispanic African American: Yes     Diabetic: Yes     Tobacco smoker: No     Systolic Blood Pressure: 762 mmHg     Is BP  treated: Yes     HDL Cholesterol: 39 mg/dL     Total Cholesterol: 142 mg/dL   A/P: Diabetes longstanding currently uncontrolled. Patient is able to verbalize appropriate hypoglycemia management plan. Medication adherence appears optimal. CBG in clinic today is improved from her previous visit. She has only had one Trulicity injection. Will continue her current regimen with the option to increase Trulicity to 1.5 mg weekly at her follow-up with Zelda. -Continue Trulicity 2.63 mg weekly.  -Continue Lantus 26 units.  -Continued metformin at current dosing.  -Extensively discussed pathophysiology of diabetes, recommended lifestyle interventions, dietary effects on blood sugar control -Counseled on s/sx of and management of hypoglycemia -Next A1C anticipated 10/2020.   Written patient instructions provided.  Total time in face to face counseling 30 minutes.   Follow up PCP Clinic Visit 10/29/2020.  Benard Halsted, PharmD, [facility], [place] [phone]

## 2020-10-17 ENCOUNTER — Other Ambulatory Visit: Payer: Self-pay | Admitting: Nurse Practitioner

## 2020-10-17 ENCOUNTER — Other Ambulatory Visit: Payer: Self-pay | Admitting: Family Medicine

## 2020-10-17 ENCOUNTER — Other Ambulatory Visit: Payer: Self-pay

## 2020-10-17 DIAGNOSIS — Z1231 Encounter for screening mammogram for malignant neoplasm of breast: Secondary | ICD-10-CM

## 2020-10-17 MED ORDER — TRULICITY 0.75 MG/0.5ML ~~LOC~~ SOAJ
0.7500 mg | SUBCUTANEOUS | 0 refills | Status: DC
  Filled 2020-10-17: qty 2, 28d supply, fill #0

## 2020-10-28 ENCOUNTER — Encounter: Payer: Self-pay | Admitting: Nurse Practitioner

## 2020-10-29 ENCOUNTER — Other Ambulatory Visit: Payer: Self-pay | Admitting: Nurse Practitioner

## 2020-10-29 ENCOUNTER — Other Ambulatory Visit: Payer: Self-pay

## 2020-10-29 ENCOUNTER — Ambulatory Visit: Payer: Medicaid Other | Attending: Nurse Practitioner | Admitting: Nurse Practitioner

## 2020-10-29 ENCOUNTER — Encounter: Payer: Self-pay | Admitting: Nurse Practitioner

## 2020-10-29 DIAGNOSIS — Z1211 Encounter for screening for malignant neoplasm of colon: Secondary | ICD-10-CM

## 2020-10-29 DIAGNOSIS — Z794 Long term (current) use of insulin: Secondary | ICD-10-CM

## 2020-10-29 DIAGNOSIS — I10 Essential (primary) hypertension: Secondary | ICD-10-CM

## 2020-10-29 DIAGNOSIS — E119 Type 2 diabetes mellitus without complications: Secondary | ICD-10-CM

## 2020-10-29 DIAGNOSIS — E1165 Type 2 diabetes mellitus with hyperglycemia: Secondary | ICD-10-CM

## 2020-10-29 DIAGNOSIS — E785 Hyperlipidemia, unspecified: Secondary | ICD-10-CM

## 2020-10-29 DIAGNOSIS — D649 Anemia, unspecified: Secondary | ICD-10-CM

## 2020-10-29 MED ORDER — TRUE METRIX BLOOD GLUCOSE TEST VI STRP
ORAL_STRIP | 12 refills | Status: AC
  Filled 2020-10-29: qty 100, 50d supply, fill #0

## 2020-10-29 MED ORDER — PROPRANOLOL HCL ER 80 MG PO CP24
ORAL_CAPSULE | Freq: Every day | ORAL | 1 refills | Status: DC
  Filled 2020-10-29: qty 90, fill #0
  Filled 2020-11-13: qty 30, 30d supply, fill #0
  Filled 2020-12-17: qty 30, 30d supply, fill #1
  Filled 2021-01-19: qty 30, 30d supply, fill #2
  Filled 2021-02-19 – 2021-02-20 (×2): qty 30, 30d supply, fill #3
  Filled 2021-03-07: qty 30, 30d supply, fill #4
  Filled 2021-04-14: qty 30, 30d supply, fill #5
  Filled 2021-04-17: qty 30, 30d supply, fill #0

## 2020-10-29 MED ORDER — INSULIN GLARGINE 100 UNIT/ML SOLOSTAR PEN
28.0000 [IU] | PEN_INJECTOR | Freq: Every day | SUBCUTANEOUS | 1 refills | Status: DC
  Filled 2020-10-29: qty 27, 96d supply, fill #0
  Filled 2020-11-25: qty 9, 28d supply, fill #0
  Filled 2020-12-17: qty 9, 28d supply, fill #1
  Filled 2021-01-19: qty 9, 28d supply, fill #2
  Filled 2021-03-07: qty 9, 28d supply, fill #3

## 2020-10-29 MED ORDER — TRUEPLUS LANCETS 28G MISC
3 refills | Status: AC
  Filled 2020-10-29: qty 100, 50d supply, fill #0

## 2020-10-29 MED ORDER — TRUE METRIX METER W/DEVICE KIT
1.0000 | PACK | Freq: Two times a day (BID) | 0 refills | Status: DC
  Filled 2020-10-29: qty 1, 365d supply, fill #0

## 2020-10-29 NOTE — Progress Notes (Unsigned)
Pt requested A1C.

## 2020-10-29 NOTE — Progress Notes (Signed)
Virtual Visit via Telephone Note Due to national recommendations of social distancing due to [place] 19, telehealth visit is felt to be most appropriate for this patient at this time.  I discussed the limitations, risks, security and privacy concerns of performing an evaluation and management service by telephone and the availability of in person appointments. I also discussed with the patient that there may be a patient responsible charge related to this service. The patient expressed understanding and agreed to proceed.    I connected with [name] on 10/29/20  at  10:30 AM EDT  EDT by telephone and verified that I am speaking with the correct person using two identifiers.  Location of Patient: Private Residence   Location of Provider: [facility] [employer] Office    Persons participating in Telemedicine visit: Tonya Rankins FNP-BC [name]    History of Present Illness: Telemedicine visit for: DM F/U She has a past medical history of DM, Hemorrhoids (11/18/2015), Hypertension, Left breast mass (09/01/2015), Long Q-T syndrome (01/14/2017), Palpitations (02/03/2016), and Vertigo.   DM Average readings: 110-170. Fasting average: 120-130.  Notes mild headaches and nausea when taking Trulicity 0.75mg . Not unbearable so she does not wish to discontinue truilicty at this time. Checking blood glucose levels twice a day. Also taking metformin 1000 mg BID and lantus 28 units nightly.  LDL not at goal however she endorses medication adherence taking atorvastatin 20 mg daily. Lab Results  Component Value Date   HGBA1C 8.9 (A) 07/29/2020    Lab Results  Component Value Date   LDLCALC 89 07/29/2020     ESSENTIAL HYPERTENSION Still with stage I hypertension.  Currently only taking propranolol for palpitations.  I have encouraged her to purchase a blood pressure monitoring device to ascertain how her blood pressure readings are at home. Denies chest pain, shortness of  breath, palpitations, lightheadedness, dizziness, headaches or BLE edema.   BP Readings from Last 3 Encounters:  09/10/20 129/84  09/03/20 (!) 142/87  08/08/20 (!) 148/108    Past Medical History:  Diagnosis Date   Diabetes mellitus without complication ([place])    Hemorrhoids 11/18/2015   Hypertension    Left breast mass 09/01/2015   Long Q-T syndrome 01/14/2017   Long QT syndrome   Palpitations 02/03/2016   PVCs seen on monitor   Vertigo     Past Surgical History:  Procedure Laterality Date   BREAST BIOPSY Left 09/03/2015   [place] Core bx, benign   ESOPHAGOGASTRODUODENOSCOPY (EGD) WITH PROPOFOL N/A 06/21/2017   Procedure: ESOPHAGOGASTRODUODENOSCOPY (EGD) WITH PROPOFOL;  Surgeon: Lin Landsman, MD;  Location: [facility];  Service: Gastroenterology;  Laterality: N/A;    Family History  Problem Relation Age of Onset   Hypertension Mother    Lung cancer Mother    Throat cancer Father    Diabetes Sister    Lupus Maternal Grandmother    Stroke Maternal Grandfather    Sudden Cardiac Death Daughter    Breast cancer Paternal Aunt     Social History   Socioeconomic History   Marital status: Divorced    Spouse name: Not on file   Number of children: Not on file   Years of education: Not on file   Highest education level: Not on file  Occupational History   Not on file  Tobacco Use   Smoking status: Never   Smokeless tobacco: Never  Vaping Use   Vaping Use: Never used  Substance and Sexual Activity   Alcohol use: No   Drug  use: No   Sexual activity: Yes    Birth control/protection: None  Other Topics Concern   Not on file  Social History Narrative   Not on file   Social Determinants of Health   Financial Resource Strain: Not on file  Food Insecurity: Not on file  Transportation Needs: Not on file  Physical Activity: Not on file  Stress: Not on file  Social Connections: Not on file     Observations/Objective: Awake, alert and oriented x 3   Review of  Systems  Constitutional:  Negative for fever, malaise/fatigue and weight loss.  HENT: Negative.  Negative for nosebleeds.   Eyes: Negative.  Negative for blurred vision, double vision and photophobia.  Respiratory: Negative.  Negative for cough and shortness of breath.   Cardiovascular: Negative.  Negative for chest pain, palpitations and leg swelling.  Gastrointestinal: Negative.  Negative for heartburn, nausea and vomiting.  Musculoskeletal: Negative.  Negative for myalgias.  Neurological: Negative.  Negative for dizziness, focal weakness, seizures and headaches.  Psychiatric/Behavioral: Negative.  Negative for suicidal ideas.    Assessment and Plan: Diagnoses and all orders for this visit:  Type 2 diabetes mellitus with hyperglycemia, with long-term current use of insulin ([place]) -     Hemoglobin A1c; Future -     Basic metabolic panel; Future -     insulin glargine (LANTUS) 100 UNIT/ML Solostar Pen; Increase lantus to 30 units nightly. Increase by 2 units every 3 days for blood glucose level greater than 140. -     Blood Glucose Monitoring Suppl (TRUE METRIX METER) w/Device KIT; Check blood glucose level by fingerstick twice per day. -     glucose blood (TRUE METRIX BLOOD GLUCOSE TEST) test strip; Use as instructed -     TRUEplus Lancets 28G MISC; Use as instructed. Check blood glucose level by fingerstick twice per day. Continue blood sugar control as discussed in office today, low carbohydrate diet, and regular physical exercise as tolerated, 150 minutes per week (30 min each day, 5 days per week, or 50 min 3 days per week). Keep blood sugar logs with fasting goal of 90-130 mg/dl, post prandial (after you eat) less than 180.  For Hypoglycemia: BS <60 and Hyperglycemia BS >400; contact the clinic ASAP. Annual eye exams and foot exams are recommended.   Dyslipidemia, goal LDL below 70 INSTRUCTIONS: Work on a low fat, heart healthy diet and participate in regular aerobic exercise program by  working out at least 150 minutes per week; 5 days a week-30 minutes per day. Avoid red meat/beef/steak,  fried foods. junk foods, sodas, sugary drinks, unhealthy snacking, alcohol and smoking.  Drink at least 80 oz of water per day and monitor your carbohydrate intake daily.    Essential hypertension -     propranolol ER (INDERAL LA) 80 MG 24 hr capsule; TAKE 1 CAPSULE BY MOUTH EVERY DAY Continue all antihypertensives as prescribed.  Remember to bring in your blood pressure log with you for your follow up appointment.  DASH/Mediterranean Diets are healthier choices for HTN.    Anemia, unspecified type -     CBC; Future  Colon cancer screening -     Fecal occult blood, imunochemical([facility]); Future    Follow Up Instructions Return in about 6 weeks (around 12/10/2020) for BP recheck.     I discussed the assessment and treatment plan with the patient. The patient was provided an opportunity to ask questions and all were answered. The patient agreed with the plan and demonstrated  an understanding of the instructions.   The patient was advised to call back or seek an in-person evaluation if the symptoms worsen or if the condition fails to improve as anticipated.  I provided 14 minutes of non-face-to-face time during this encounter including median intraservice time, reviewing previous notes, labs, imaging, medications and explaining diagnosis and management.  Tonya Pounds, FNP-BC

## 2020-10-30 LAB — HEMOGLOBIN A1C
Est. average glucose Bld gHb Est-mCnc: 171 mg/dL
Hgb A1c MFr Bld: 7.6 % — ABNORMAL HIGH (ref 4.8–5.6)

## 2020-11-05 ENCOUNTER — Other Ambulatory Visit: Payer: Self-pay

## 2020-11-05 ENCOUNTER — Encounter (HOSPITAL_COMMUNITY): Payer: Self-pay

## 2020-11-05 ENCOUNTER — Ambulatory Visit (HOSPITAL_COMMUNITY)
Admission: EM | Admit: 2020-11-05 | Discharge: 2020-11-05 | Disposition: A | Discharge status: Home / Self Care | Payer: Self-pay | Attending: Emergency Medicine | Admitting: Emergency Medicine

## 2020-11-05 DIAGNOSIS — R1032 Left lower quadrant pain: Secondary | ICD-10-CM | POA: Insufficient documentation

## 2020-11-05 DIAGNOSIS — N390 Urinary tract infection, site not specified: Secondary | ICD-10-CM | POA: Insufficient documentation

## 2020-11-05 LAB — POCT URINALYSIS DIPSTICK, ED / UC
Bilirubin Urine: NEGATIVE
Glucose, UA: NEGATIVE mg/dL
Hgb urine dipstick: NEGATIVE
Nitrite: POSITIVE — AB
Protein, ur: NEGATIVE mg/dL
Specific Gravity, Urine: 1.025 (ref 1.005–1.030)
Urobilinogen, UA: 0.2 mg/dL (ref 0.0–1.0)
pH: 5 (ref 5.0–8.0)

## 2020-11-05 LAB — POC URINE PREG, ED: Preg Test, Ur: NEGATIVE

## 2020-11-05 MED ORDER — PHENAZOPYRIDINE HCL 200 MG PO TABS: 200.0000 mg | ORAL_TABLET | Freq: Three times a day (TID) | ORAL | 0 refills | Status: DC

## 2020-11-05 MED ORDER — NITROFURANTOIN MONOHYD MACRO 100 MG PO CAPS: 100.0000 mg | ORAL_CAPSULE | Freq: Two times a day (BID) | ORAL | 0 refills | Status: DC

## 2020-11-05 NOTE — ED Provider Notes (Signed)
[facility]    CSN: [id number] Arrival date & time: 11/05/20  1034      History   Chief Complaint Chief Complaint  Patient presents with   Abdominal Pain    HPI Tonya Paul is a 45 y.o. female.   Pt here for frequency, llq abd pain, lt flank pain. Has a hx of uti approx 3 xs a year. Denies any n/v/d no fevers. Has not taken anything pta. Pt is a DM and has sugars controlled.    Past Medical History:  Diagnosis Date   Diabetes mellitus without complication ([place])    Hemorrhoids 11/18/2015   Hypertension    Left breast mass 09/01/2015   Long Q-T syndrome 01/14/2017   Long QT syndrome   Palpitations 02/03/2016   PVCs seen on monitor   Vertigo     Patient Active Problem List   Diagnosis Date Noted   Pain of left patellofemoral joint 09/10/2020   Elevated coronary artery calcium score 01/04/2020   Hyperlipidemia 01/04/2020   Essential hypertension 06/14/2019   Dizziness 06/14/2019   Abdominal pain, chronic, epigastric    GERD (gastroesophageal reflux disease) 06/20/2017   Long Q-T syndrome 01/14/2017   Palpitations 02/03/2016   Uterine fibroid 12/11/2015   Family history of sudden cardiac death 31-Dec-2015   Hemorrhoids 11/18/2015   Left breast mass 09/01/2015   Obesity 09/01/2015   Insulin dependent type 2 diabetes mellitus ([place]) 04/16/2015    Past Surgical History:  Procedure Laterality Date   BREAST BIOPSY Left 09/03/2015   [place] Core bx, benign   ESOPHAGOGASTRODUODENOSCOPY (EGD) WITH PROPOFOL N/A 06/21/2017   Procedure: ESOPHAGOGASTRODUODENOSCOPY (EGD) WITH PROPOFOL;  Surgeon: Lin Landsman, MD;  Location: [facility];  Service: Gastroenterology;  Laterality: N/A;    OB History     Gravida  3   Para  2   Term  2   Preterm      AB  1   Living  1      SAB  1   IAB      Ectopic      Multiple      Live Births  2            Home Medications    Prior to Admission medications   Medication Sig Start Date End Date  Taking? Authorizing Provider  nitrofurantoin, macrocrystal-monohydrate, (MACROBID) 100 MG capsule Take 1 capsule (100 mg total) by mouth 2 (two) times daily. 11/05/20  Yes Marney Setting, NP  phenazopyridine (PYRIDIUM) 200 MG tablet Take 1 tablet (200 mg total) by mouth 3 (three) times daily. 11/05/20  Yes Morley Kos L, NP  atorvastatin (LIPITOR) 20 MG tablet TAKE 1 TABLET (20 MG TOTAL) BY MOUTH DAILY. 07/29/20 07/29/21  Gildardo Pounds, NP  Blood Glucose Monitoring Suppl (TRUE METRIX METER) w/Device KIT Check blood glucose level by fingerstick twice per day. 10/29/20   Gildardo Pounds, NP  Dulaglutide (TRULICITY) 1.88 CZ/6.6AY SOPN Inject 0.75 mg into the skin once a week. 10/17/20   Gildardo Pounds, NP  EPINEPHrine 0.3 mg/0.3 mL IJ SOAJ injection Inject 0.3 mLs (0.3 mg total) into the muscle as needed for anaphylaxis. 07/15/19   Rudene Re, MD  ferrous sulfate 325 (65 FE) MG tablet Take 1 tablet by mouth 2 (two) times daily. 08/05/20 11/16/20  Gildardo Pounds, NP  glucose blood (TRUE METRIX BLOOD GLUCOSE TEST) test strip Use as instructed 10/29/20   Gildardo Pounds, NP  insulin glargine (LANTUS) 100 UNIT/ML Solostar Pen  Increase lantus to 30 units nightly. Increase by 2 units every 3 days for blood glucose level greater than 140. 10/29/20 01/27/21  Gildardo Pounds, NP  Insulin Pen Needle 31G X 5 MM MISC USE AS INSTRUCTED 04/30/20 04/30/21  Argentina Donovan, PA-C  metFORMIN (GLUCOPHAGE) 500 MG tablet TAKE 2 TABLETS (1,000 MG TOTAL) BY MOUTH 2 (TWO) TIMES DAILY WITH A MEAL. 07/29/20 07/29/21  Gildardo Pounds, NP  propranolol ER (INDERAL LA) 80 MG 24 hr capsule TAKE 1 CAPSULE BY MOUTH EVERY DAY 10/29/20 01/27/21  Gildardo Pounds, NP  TRUEplus Lancets 28G MISC Use as instructed. Check blood glucose level by fingerstick twice per day. 10/29/20   Gildardo Pounds, NP  lisinopril (ZESTRIL) 5 MG tablet Take 1 tablet (5 mg total) by mouth daily. 01/04/20 03/01/20  Lorretta Harp, MD    Family  History Family History  Problem Relation Age of Onset   Hypertension Mother    Lung cancer Mother    Throat cancer Father    Diabetes Sister    Lupus Maternal Grandmother    Stroke Maternal Grandfather    Sudden Cardiac Death Daughter    Breast cancer Paternal Aunt     Social History Social History   Tobacco Use   Smoking status: Never   Smokeless tobacco: Never  Vaping Use   Vaping Use: Never used  Substance Use Topics   Alcohol use: No   Drug use: No     Allergies   Ace inhibitors, Aspirin, Rocephin [ceftriaxone], Ciprofloxacin, and Prednisone   Review of Systems Review of Systems  Constitutional: Negative.  Negative for fever.  Respiratory: Negative.    Cardiovascular: Negative.   Gastrointestinal:  Positive for abdominal pain. Negative for constipation, diarrhea, nausea and vomiting.  Genitourinary:  Positive for dysuria, flank pain, frequency and pelvic pain. Negative for hematuria, menstrual problem, vaginal bleeding and vaginal discharge.  Neurological: Negative.     Physical Exam Triage Vital Signs ED Triage Vitals  Enc Vitals Group     BP 11/05/20 1121 (!) 135/92     Pulse Rate 11/05/20 1121 89     Resp 11/05/20 1121 18     Temp 11/05/20 1121 98.4 F (36.9 C)     Temp Source 11/05/20 1121 Oral     SpO2 11/05/20 1121 100 %     Weight --      Height --      Head Circumference --      Peak Flow --      Pain Score 11/05/20 1117 5     Pain Loc --      Pain Edu? --      Excl. in [place]? --    No data found.  Updated Vital Signs BP (!) 135/92 (BP Location: Right Arm)   Pulse 89   Temp 98.4 F (36.9 C) (Oral)   Resp 18   SpO2 100%   Visual Acuity Right Eye Distance:   Left Eye Distance:   Bilateral Distance:    Right Eye Near:   Left Eye Near:    Bilateral Near:     Physical Exam Constitutional:      Appearance: She is well-developed.  Cardiovascular:     Rate and Rhythm: Normal rate.  Pulmonary:     Effort: Pulmonary effort is  normal.  Abdominal:     General: Abdomen is flat. Bowel sounds are normal.     Palpations: Abdomen is soft.     Tenderness: There is abdominal  tenderness in the left lower quadrant. There is left CVA tenderness. There is no right CVA tenderness.  Neurological:     Mental Status: She is alert.     UC Treatments / Results  Labs (all labs ordered are listed, but only abnormal results are displayed) Labs Reviewed  POCT URINALYSIS DIPSTICK, ED / UC - Abnormal; Notable for the following components:      Result Value   Ketones, ur TRACE (*)    Nitrite POSITIVE (*)    Leukocytes,Ua SMALL (*)    All other components within normal limits  URINE CULTURE  POC URINE PREG, ED    EKG   Radiology No results found.  Procedures Procedures (including critical care time)  Medications Ordered in UC Medications - No data to display  Initial Impression / Assessment and Plan / UC Course  I have reviewed the triage vital signs and the nursing notes.  Pertinent labs & imaging results that were available during my care of the patient were reviewed by me and considered in my medical decision making (see chart for details).     You will need to see pcp after abx for follow up  Not able to place you on keflex  due to allergies  We will send off culture of urine this will return in 3 days check your my chart  Take medications with food  Do not take pyridium longer that 3 days  Final Clinical Impressions(s) / UC Diagnoses   Final diagnoses:  Left lower quadrant abdominal pain  Lower urinary tract infectious disease     Discharge Instructions      You will need to see pcp after abx for follow up  Not able to place you on keflex  due to allergies  We will send off culture of urine this will return in 3 days check your my chart  Take medications with food  Do not take pyridium longer that 3 days      ED Prescriptions     Medication Sig Dispense Auth. Provider   phenazopyridine  (PYRIDIUM) 200 MG tablet Take 1 tablet (200 mg total) by mouth 3 (three) times daily. 6 tablet Morley Kos L, NP   nitrofurantoin, macrocrystal-monohydrate, (MACROBID) 100 MG capsule Take 1 capsule (100 mg total) by mouth 2 (two) times daily. 10 capsule Marney Setting, NP      PDMP not reviewed this encounter.   Marney Setting, NP 11/05/20 [phone]

## 2020-11-05 NOTE — Discharge Instructions (Addendum)
You will need to see pcp after abx for follow up  Not able to place you on keflex  due to allergies  We will send off culture of urine this will return in 3 days check your my chart  Take medications with food  Do not take pyridium longer that 3 days

## 2020-11-05 NOTE — ED Triage Notes (Signed)
Pt reports left lower quadrant abdominal pian, left sided lower back pain and "left kidney pain x 2 day.

## 2020-11-07 LAB — URINE CULTURE: Culture: >=100000 — AB

## 2020-11-13 ENCOUNTER — Other Ambulatory Visit: Payer: Self-pay

## 2020-11-13 ENCOUNTER — Other Ambulatory Visit: Payer: Self-pay | Admitting: Nurse Practitioner

## 2020-11-13 ENCOUNTER — Encounter: Payer: Self-pay | Admitting: Nurse Practitioner

## 2020-11-13 DIAGNOSIS — R109 Unspecified abdominal pain: Secondary | ICD-10-CM

## 2020-11-14 ENCOUNTER — Other Ambulatory Visit: Payer: Self-pay

## 2020-11-25 ENCOUNTER — Other Ambulatory Visit: Payer: Self-pay | Admitting: Nurse Practitioner

## 2020-11-25 ENCOUNTER — Other Ambulatory Visit: Payer: Self-pay

## 2020-11-25 MED ORDER — TRULICITY 0.75 MG/0.5ML ~~LOC~~ SOAJ
0.7500 mg | SUBCUTANEOUS | 0 refills | Status: DC
  Filled 2020-11-25: qty 2, 28d supply, fill #0

## 2020-11-25 NOTE — Telephone Encounter (Signed)
Requested Prescriptions  Pending Prescriptions Disp Refills  . Dulaglutide (TRULICITY) A999333 [id number] SOPN 2 mL 0    Sig: Inject 0.75 mg into the skin once a week.     Endocrinology:  Diabetes - GLP-1 Receptor Agonists Passed - 11/25/2020  8:20 AM      Passed - HBA1C is between 0 and 7.9 and within 180 days    HbA1c, POC (controlled diabetic range)  Date Value Ref Range Status  07/29/2020 8.9 (A) 0.0 - 7.0 % Final   Hgb A1c MFr Bld  Date Value Ref Range Status  10/29/2020 7.6 (H) 4.8 - 5.6 % Final    Comment:             Prediabetes: 5.7 - 6.4          Diabetes: >6.4          Glycemic control for adults with diabetes: <7.0          Passed - Valid encounter within last 6 months    Recent Outpatient Visits          3 weeks ago Type 2 diabetes mellitus with hyperglycemia, with long-term current use of insulin [facility])   [place], [place] W, NP   1 month ago Insulin dependent type 2 diabetes mellitus [facility])   [place], Annie Main L, RPH-CPP   2 months ago Insulin dependent type 2 diabetes mellitus [facility])   [place], Annie Main L, RPH-CPP   3 months ago Insulin dependent type 2 diabetes mellitus [facility])   [place], Vernia Buff, NP   6 months ago [place], [place], [place]

## 2020-11-26 ENCOUNTER — Other Ambulatory Visit: Payer: Self-pay

## 2020-11-28 ENCOUNTER — Other Ambulatory Visit: Payer: Self-pay

## 2020-11-30 ENCOUNTER — Telehealth: Payer: Medicaid Other | Admitting: Nurse Practitioner

## 2020-11-30 DIAGNOSIS — K0889 Other specified disorders of teeth and supporting structures: Secondary | ICD-10-CM

## 2020-11-30 DIAGNOSIS — R220 Localized swelling, mass and lump, head: Secondary | ICD-10-CM

## 2020-11-30 MED ORDER — CHLORHEXIDINE GLUCONATE 0.12 % MT SOLN: 15.0000 mL | Freq: Two times a day (BID) | OROMUCOSAL | 0 refills | Status: DC

## 2020-11-30 NOTE — Patient Instructions (Addendum)
Tonya Paul, thank you for joining Reynolds Bowl, NP for today's virtual visit.  While this provider is not your primary care provider (PCP), if your PCP is located in our provider database this encounter information will be shared with them immediately following your visit.  Consent: (Patient) Tonya Paul provided verbal consent for this virtual visit at the beginning of the encounter.  Current Medications:  Current Outpatient Medications:    atorvastatin (LIPITOR) 20 MG tablet, TAKE 1 TABLET (20 MG TOTAL) BY MOUTH DAILY., Disp: 90 tablet, Rfl: 3   Blood Glucose Monitoring Suppl (TRUE METRIX METER) w/Device KIT, Check blood glucose level by fingerstick twice per day., Disp: 1 kit, Rfl: 0   chlorhexidine (PERIDEX) 0.12 % solution, Use as directed 15 mLs in the mouth or throat 2 (two) times daily., Disp: 120 mL, Rfl: 0   Dulaglutide (TRULICITY) 6.14 ER/1.5QM SOPN, Inject 0.75 mg into the skin once a week., Disp: 2 mL, Rfl: 0   EPINEPHrine 0.3 mg/0.3 mL IJ SOAJ injection, Inject 0.3 mLs (0.3 mg total) into the muscle as needed for anaphylaxis., Disp: 1 each, Rfl: 0   glucose blood (TRUE METRIX BLOOD GLUCOSE TEST) test strip, Use as instructed, Disp: 100 each, Rfl: 12   insulin glargine (LANTUS) 100 UNIT/ML Solostar Pen, Increase lantus to 30 units nightly. Increase by 2 units every 3 days for blood glucose level greater than 140., Disp: 27 mL, Rfl: 1   Insulin Pen Needle 31G X 5 MM MISC, USE AS INSTRUCTED, Disp: 100 each, Rfl: 1   metFORMIN (GLUCOPHAGE) 500 MG tablet, TAKE 2 TABLETS (1,000 MG TOTAL) BY MOUTH 2 (TWO) TIMES DAILY WITH A MEAL., Disp: 270 tablet, Rfl: 1   propranolol ER (INDERAL LA) 80 MG 24 hr capsule, TAKE 1 CAPSULE BY MOUTH EVERY DAY, Disp: 90 capsule, Rfl: 1   TRUEplus Lancets 28G MISC, Use as instructed. Check blood glucose level by fingerstick twice per day., Disp: 100 each, Rfl: 3   ferrous sulfate 325 (65 FE) MG tablet, Take 1 tablet by mouth 2 (two) times daily., Disp:  180 tablet, Rfl: 1   nitrofurantoin, macrocrystal-monohydrate, (MACROBID) 100 MG capsule, Take 1 capsule (100 mg total) by mouth 2 (two) times daily., Disp: 10 capsule, Rfl: 0   phenazopyridine (PYRIDIUM) 200 MG tablet, Take 1 tablet (200 mg total) by mouth 3 (three) times daily., Disp: 6 tablet, Rfl: 0   Medications ordered in this encounter:  Meds ordered this encounter  Medications   chlorhexidine (PERIDEX) 0.12 % solution    Sig: Use as directed 15 mLs in the mouth or throat 2 (two) times daily.    Dispense:  120 mL    Refill:  0     *If you need refills on other medications prior to your next appointment, please contact your pharmacy*  Follow-Up: Call back or seek an in-person evaluation if the symptoms worsen or if the condition fails to improve as anticipated.  Other Instructions Will provide Peridex mouthwash for gum irritation. Difficult to visualize affected area using video. Instructions provided to use mouthwash 2 times daily until symptoms improve. Continue use of warm saltwater rinses, Ibuprofen for pain and swelling. Follow up with dentist asap for follow-up.    If you have been instructed to have an in-person evaluation today at a local Urgent Care facility, please use the link below. It will take you to a list of all of our available [facility], including address, phone number and hours of operation. Please do not  delay care.  [facility]  If you or a family member do not have a primary care provider, use the link below to schedule a visit and establish care. When you choose a [facility] primary care physician or advanced practice provider, you gain a long-term partner in health. Find a Primary Care Provider  Learn more about 's in-office and virtual care options: [facility]

## 2020-11-30 NOTE — Progress Notes (Signed)
Virtual Visit Consent   Tonya Paul, you are scheduled for a virtual visit with a [facility] provider today.     Just as with appointments in the office, your consent must be obtained to participate.  Your consent will be active for this visit and any virtual visit you may have with one of our providers in the next 365 days.     If you have a MyChart account, a copy of this consent can be sent to you electronically.  All virtual visits are billed to your insurance company just like a traditional visit in the office.    As this is a virtual visit, video technology does not allow for your provider to perform a traditional examination.  This may limit your provider's ability to fully assess your condition.  If your provider identifies any concerns that need to be evaluated in person or the need to arrange testing (such as labs, EKG, etc.), we will make arrangements to do so.     Although advances in technology are sophisticated, we cannot ensure that it will always work on either your end or our end.  If the connection with a video visit is poor, the visit may have to be switched to a telephone visit.  With either a video or telephone visit, we are not always able to ensure that we have a secure connection.     I need to obtain your verbal consent now.   Are you willing to proceed with your visit today? Yes   Tonya Paul has provided verbal consent on 11/30/2020 for a virtual visit (video or telephone).   Reynolds Bowl, NP   Date: 11/30/2020 11:08 AM   Virtual Visit via Video Note   I, Reynolds Bowl, connected with  Tonya Paul  ([id number], 03-30-1975) on 11/30/20 at 11:30 AM EDT by a video-enabled telemedicine application and verified that I am speaking with the correct person using two identifiers.  Location: Patient: Virtual Visit Location Patient: Home Provider: Virtual Visit Location Provider: Home Office   I discussed the limitations of evaluation and management by  telemedicine and the availability of in person appointments. The patient expressed understanding and agreed to proceed.    History of Present Illness: Tonya Paul is a 45 y.o. who identifies as a female who was assigned female at birth, and is being seen today for swelling and tenderness to right side of face. Symptoms present x 1 day. She woke up today with swelling to right jaw. Denies tooth pain, pain with talking, chewing, or eating. She admits to needing to have a tooth pulled in the same area. Has been using warm saltwater rinses and Ibuprofen with good relief.  HPI: HPI  Problems:  Patient Active Problem List   Diagnosis Date Noted   Pain of left patellofemoral joint 09/10/2020   Elevated coronary artery calcium score 01/04/2020   Hyperlipidemia 01/04/2020   Essential hypertension 06/14/2019   Dizziness 06/14/2019   Abdominal pain, chronic, epigastric    GERD (gastroesophageal reflux disease) 06/20/2017   Long Q-T syndrome 01/14/2017   Palpitations 02/03/2016   Uterine fibroid 12/11/2015   Family history of sudden cardiac death 12-11-15   Hemorrhoids 11/18/2015   Left breast mass 09/01/2015   Obesity 09/01/2015   Insulin dependent type 2 diabetes mellitus ([place]) 04/16/2015    Allergies:  Allergies  Allergen Reactions   Ace Inhibitors Shortness Of Breath    Reported by the patient after a trial of lisinopril prescribes by PCP   Aspirin  Anaphylaxis   Rocephin [Ceftriaxone] Itching   Ciprofloxacin Itching   Prednisone Palpitations   Medications:  Current Outpatient Medications:    atorvastatin (LIPITOR) 20 MG tablet, TAKE 1 TABLET (20 MG TOTAL) BY MOUTH DAILY., Disp: 90 tablet, Rfl: 3   Blood Glucose Monitoring Suppl (TRUE METRIX METER) w/Device KIT, Check blood glucose level by fingerstick twice per day., Disp: 1 kit, Rfl: 0   Dulaglutide (TRULICITY) 0.17 CB/4.4HQ SOPN, Inject 0.75 mg into the skin once a week., Disp: 2 mL, Rfl: 0   EPINEPHrine 0.3 mg/0.3 mL IJ SOAJ  injection, Inject 0.3 mLs (0.3 mg total) into the muscle as needed for anaphylaxis., Disp: 1 each, Rfl: 0   ferrous sulfate 325 (65 FE) MG tablet, Take 1 tablet by mouth 2 (two) times daily., Disp: 180 tablet, Rfl: 1   glucose blood (TRUE METRIX BLOOD GLUCOSE TEST) test strip, Use as instructed, Disp: 100 each, Rfl: 12   insulin glargine (LANTUS) 100 UNIT/ML Solostar Pen, Increase lantus to 30 units nightly. Increase by 2 units every 3 days for blood glucose level greater than 140., Disp: 27 mL, Rfl: 1   Insulin Pen Needle 31G X 5 MM MISC, USE AS INSTRUCTED, Disp: 100 each, Rfl: 1   metFORMIN (GLUCOPHAGE) 500 MG tablet, TAKE 2 TABLETS (1,000 MG TOTAL) BY MOUTH 2 (TWO) TIMES DAILY WITH A MEAL., Disp: 270 tablet, Rfl: 1   nitrofurantoin, macrocrystal-monohydrate, (MACROBID) 100 MG capsule, Take 1 capsule (100 mg total) by mouth 2 (two) times daily., Disp: 10 capsule, Rfl: 0   phenazopyridine (PYRIDIUM) 200 MG tablet, Take 1 tablet (200 mg total) by mouth 3 (three) times daily., Disp: 6 tablet, Rfl: 0   propranolol ER (INDERAL LA) 80 MG 24 hr capsule, TAKE 1 CAPSULE BY MOUTH EVERY DAY, Disp: 90 capsule, Rfl: 1   TRUEplus Lancets 28G MISC, Use as instructed. Check blood glucose level by fingerstick twice per day., Disp: 100 each, Rfl: 3  Observations/Objective: Patient is well-developed, well-nourished in no acute distress.  Resting comfortably at home.  Head is normocephalic, atraumatic.  No labored breathing.  Speech is clear and coherent with logical content.  Patient is alert and oriented at baseline.   Assessment and Plan: 1. Dentalgia - chlorhexidine (PERIDEX) 0.12 % solution; Use as directed 15 mLs in the mouth or throat 2 (two) times daily.  Dispense: 120 mL; Refill: 0  2. Right facial swelling - chlorhexidine (PERIDEX) 0.12 % solution; Use as directed 15 mLs in the mouth or throat 2 (two) times daily.  Dispense: 120 mL; Refill: 0  1.2. Will provide Peridex mouthwash for gum irritation.  Difficult to visualize affected area using video. Instructions provided to use mouthwash 2 times daily until symptoms improve. Continue use of warm saltwater rinses, Ibuprofen for pain and swelling. Follow up with dentist asap for follow-up. Patient verbalizes understanding, all questions answered.   Follow Up Instructions: I discussed the assessment and treatment plan with the patient. The patient was provided an opportunity to ask questions and all were answered. The patient agreed with the plan and demonstrated an understanding of the instructions.  A copy of instructions were sent to the patient via MyChart.  The patient was advised to call back or seek an in-person evaluation if the symptoms worsen or if the condition fails to improve as anticipated.  Time:  I spent 15  minutes with the patient via telehealth technology discussing the above problems/concerns.    Reynolds Bowl, NP

## 2020-12-01 ENCOUNTER — Encounter: Payer: Self-pay | Admitting: Nurse Practitioner

## 2020-12-01 ENCOUNTER — Other Ambulatory Visit: Payer: Self-pay | Admitting: Nurse Practitioner

## 2020-12-01 MED ORDER — PENICILLIN V POTASSIUM 500 MG PO TABS: 500.0000 mg | ORAL_TABLET | Freq: Four times a day (QID) | ORAL | 0 refills | Status: DC

## 2020-12-04 ENCOUNTER — Telehealth: Payer: Medicaid Other | Admitting: Physician Assistant

## 2020-12-04 DIAGNOSIS — K047 Periapical abscess without sinus: Secondary | ICD-10-CM

## 2020-12-04 MED ORDER — AMOXICILLIN-POT CLAVULANATE 875-125 MG PO TABS: 1.0000 | ORAL_TABLET | Freq: Two times a day (BID) | ORAL | 0 refills | Status: DC

## 2020-12-04 NOTE — Progress Notes (Signed)
Virtual Visit Consent   [name], you are scheduled for a virtual visit with a [facility] provider today.     Just as with appointments in the office, your consent must be obtained to participate.  Your consent will be active for this visit and any virtual visit you may have with one of our providers in the next 365 days.     If you have a MyChart account, a copy of this consent can be sent to you electronically.  All virtual visits are billed to your insurance company just like a traditional visit in the office.    As this is a virtual visit, video technology does not allow for your provider to perform a traditional examination.  This may limit your provider's ability to fully assess your condition.  If your provider identifies any concerns that need to be evaluated in person or the need to arrange testing (such as labs, EKG, etc.), we will make arrangements to do so.     Although advances in technology are sophisticated, we cannot ensure that it will always work on either your end or our end.  If the connection with a video visit is poor, the visit may have to be switched to a telephone visit.  With either a video or telephone visit, we are not always able to ensure that we have a secure connection.     I need to obtain your verbal consent now.   Are you willing to proceed with your visit today?    Tonya Paul has provided verbal consent on 12/04/2020 for a virtual visit (video or telephone).   Leeanne Rio, [place]   Date: 12/04/2020 9:52 AM   Virtual Visit via Video Note   I, Leeanne Rio, connected with  Tonya Paul  ([id number], 08-23-1975) on 12/04/20 at  9:45 AM EDT by a video-enabled telemedicine application and verified that I am speaking with the correct person using two identifiers.  Location: Patient: Virtual Visit Location Patient: Home Provider: Virtual Visit Location Provider: Home Office   I discussed the limitations of evaluation and management by  telemedicine and the availability of in person appointments. The patient expressed understanding and agreed to proceed.    History of Present Illness: Tonya Paul is a 45 y.o. who identifies as a female who was assigned female at birth, and is being seen today for possible dental abscess. Initially noted pain in the area over the past week with mild swelling. Was seen via video visit 11/30/2020 by another of our providers and diagnosed with dentalgia. Was given Peridex solution to use. She endorses using as directed which has helped with some of the discomfort but as of this morning she notes a visible dental abscess of right upper tooth/gum. Still with milder pain. Notes some occasional drainage. Denies fevers, chills. Is able to move jaw without pain. Has contacted dentist but cannot be seen until end of the month.   HPI: HPI  Problems:  Patient Active Problem List   Diagnosis Date Noted   Pain of left patellofemoral joint 09/10/2020   Elevated coronary artery calcium score 01/04/2020   Hyperlipidemia 01/04/2020   Essential hypertension 06/14/2019   Dizziness 06/14/2019   Abdominal pain, chronic, epigastric    GERD (gastroesophageal reflux disease) 06/20/2017   Long Q-T syndrome 01/14/2017   Palpitations 02/03/2016   Uterine fibroid 12/11/2015   Family history of sudden cardiac death 12/06/15   Hemorrhoids 11/18/2015   Left breast mass 09/01/2015   Obesity 09/01/2015  Insulin dependent type 2 diabetes mellitus ([place]) 04/16/2015    Allergies:  Allergies  Allergen Reactions   Ace Inhibitors Shortness Of Breath    Reported by the patient after a trial of lisinopril prescribes by PCP   Aspirin Anaphylaxis   Rocephin [Ceftriaxone] Itching   Ciprofloxacin Itching   Prednisone Palpitations   Medications:  Current Outpatient Medications:    amoxicillin-clavulanate (AUGMENTIN) 875-125 MG tablet, Take 1 tablet by mouth 2 (two) times daily., Disp: 14 tablet, Rfl: 0   atorvastatin  (LIPITOR) 20 MG tablet, TAKE 1 TABLET (20 MG TOTAL) BY MOUTH DAILY., Disp: 90 tablet, Rfl: 3   Blood Glucose Monitoring Suppl (TRUE METRIX METER) w/Device KIT, Check blood glucose level by fingerstick twice per day., Disp: 1 kit, Rfl: 0   chlorhexidine (PERIDEX) 0.12 % solution, Use as directed 15 mLs in the mouth or throat 2 (two) times daily., Disp: 120 mL, Rfl: 0   Dulaglutide (TRULICITY) 3.82 NK/5.3ZJ SOPN, Inject 0.75 mg into the skin once a week., Disp: 2 mL, Rfl: 0   EPINEPHrine 0.3 mg/0.3 mL IJ SOAJ injection, Inject 0.3 mLs (0.3 mg total) into the muscle as needed for anaphylaxis., Disp: 1 each, Rfl: 0   ferrous sulfate 325 (65 FE) MG tablet, Take 1 tablet by mouth 2 (two) times daily., Disp: 180 tablet, Rfl: 1   glucose blood (TRUE METRIX BLOOD GLUCOSE TEST) test strip, Use as instructed, Disp: 100 each, Rfl: 12   insulin glargine (LANTUS) 100 UNIT/ML Solostar Pen, Increase lantus to 30 units nightly. Increase by 2 units every 3 days for blood glucose level greater than 140., Disp: 27 mL, Rfl: 1   Insulin Pen Needle 31G X 5 MM MISC, USE AS INSTRUCTED, Disp: 100 each, Rfl: 1   metFORMIN (GLUCOPHAGE) 500 MG tablet, TAKE 2 TABLETS (1,000 MG TOTAL) BY MOUTH 2 (TWO) TIMES DAILY WITH A MEAL., Disp: 270 tablet, Rfl: 1   propranolol ER (INDERAL LA) 80 MG 24 hr capsule, TAKE 1 CAPSULE BY MOUTH EVERY DAY, Disp: 90 capsule, Rfl: 1   TRUEplus Lancets 28G MISC, Use as instructed. Check blood glucose level by fingerstick twice per day., Disp: 100 each, Rfl: 3  Observations/Objective: Patient is well-developed, well-nourished in no acute distress.  Resting comfortably at home.  Head is normocephalic, atraumatic.  No labored breathing. Speech is clear and coherent with logical content.  Patient is alert and oriented at baseline.  R upper premolar region with noted gingival/dental abscess.   Assessment and Plan: 1. Dental abscess - amoxicillin-clavulanate (AUGMENTIN) 875-125 MG tablet; Take 1 tablet  by mouth 2 (two) times daily.  Dispense: 14 tablet; Refill: 0 Without fever, chills or jaw pain. She is to reach back out to dentist to let them know she was evaluated by provider and told she needs follow-up within 10 days. Will start Augment twice daily for 7 days. Seems she had messaged her PCP a few days ago about this. A Rx Penicillin was sent in but patient was never made aware of this and had not started. She is to start the Augmentin only. Supportive measures and OTC meds reviewed. Strict ER precautions discussed.   Follow Up Instructions: I discussed the assessment and treatment plan with the patient. The patient was provided an opportunity to ask questions and all were answered. The patient agreed with the plan and demonstrated an understanding of the instructions.  A copy of instructions were sent to the patient via MyChart.  The patient was advised to call back or  seek an in-person evaluation if the symptoms worsen or if the condition fails to improve as anticipated.  Time:  I spent 15 minutes with the patient via telehealth technology discussing the above problems/concerns.    Leeanne Rio, PA-C

## 2020-12-04 NOTE — Patient Instructions (Signed)
[name], thank you for joining Leeanne Rio, PA-C for today's virtual visit.  While this provider is not your primary care provider (PCP), if your PCP is located in our provider database this encounter information will be shared with them immediately following your visit.  Consent: (Patient) [name] provided verbal consent for this virtual visit at the beginning of the encounter.  Current Medications:  Current Outpatient Medications:    atorvastatin (LIPITOR) 20 MG tablet, TAKE 1 TABLET (20 MG TOTAL) BY MOUTH DAILY., Disp: 90 tablet, Rfl: 3   Blood Glucose Monitoring Suppl (TRUE METRIX METER) w/Device KIT, Check blood glucose level by fingerstick twice per day., Disp: 1 kit, Rfl: 0   chlorhexidine (PERIDEX) 0.12 % solution, Use as directed 15 mLs in the mouth or throat 2 (two) times daily., Disp: 120 mL, Rfl: 0   Dulaglutide (TRULICITY) 3.29 JM/4.2AS SOPN, Inject 0.75 mg into the skin once a week., Disp: 2 mL, Rfl: 0   EPINEPHrine 0.3 mg/0.3 mL IJ SOAJ injection, Inject 0.3 mLs (0.3 mg total) into the muscle as needed for anaphylaxis., Disp: 1 each, Rfl: 0   ferrous sulfate 325 (65 FE) MG tablet, Take 1 tablet by mouth 2 (two) times daily., Disp: 180 tablet, Rfl: 1   glucose blood (TRUE METRIX BLOOD GLUCOSE TEST) test strip, Use as instructed, Disp: 100 each, Rfl: 12   insulin glargine (LANTUS) 100 UNIT/ML Solostar Pen, Increase lantus to 30 units nightly. Increase by 2 units every 3 days for blood glucose level greater than 140., Disp: 27 mL, Rfl: 1   Insulin Pen Needle 31G X 5 MM MISC, USE AS INSTRUCTED, Disp: 100 each, Rfl: 1   metFORMIN (GLUCOPHAGE) 500 MG tablet, TAKE 2 TABLETS (1,000 MG TOTAL) BY MOUTH 2 (TWO) TIMES DAILY WITH A MEAL., Disp: 270 tablet, Rfl: 1   nitrofurantoin, macrocrystal-monohydrate, (MACROBID) 100 MG capsule, Take 1 capsule (100 mg total) by mouth 2 (two) times daily., Disp: 10 capsule, Rfl: 0   penicillin v potassium (VEETID) 500 MG tablet, Take 1  tablet (500 mg total) by mouth 4 (four) times daily for 7 days., Disp: 28 tablet, Rfl: 0   phenazopyridine (PYRIDIUM) 200 MG tablet, Take 1 tablet (200 mg total) by mouth 3 (three) times daily., Disp: 6 tablet, Rfl: 0   propranolol ER (INDERAL LA) 80 MG 24 hr capsule, TAKE 1 CAPSULE BY MOUTH EVERY DAY, Disp: 90 capsule, Rfl: 1   TRUEplus Lancets 28G MISC, Use as instructed. Check blood glucose level by fingerstick twice per day., Disp: 100 each, Rfl: 3   Medications ordered in this encounter:  No orders of the defined types were placed in this encounter.    *If you need refills on other medications prior to your next appointment, please contact your pharmacy*  Follow-Up: Call back or seek an in-person evaluation if the symptoms worsen or if the condition fails to improve as anticipated.  Other Instructions Please keep hydrated. You can alternate tylenol and ibuprofen OTC as needed for pain. Continue the Peridex. Start the Augmentin, taking as directed with food. Please contact your dentist to let them know you have been instructed to be seen within 10 days. If anything worsens or you note significant jaw pain or fever developing, you need to be seen at nearest Urgent Care or ER.    If you have been instructed to have an in-person evaluation today at a local Urgent Care facility, please use the link below. It will take you to a list of  all of our available [facility], including address, phone number and hours of operation. Please do not delay care.  [facility]  If you or a family member do not have a primary care provider, use the link below to schedule a visit and establish care. When you choose a [facility] primary care physician or advanced practice provider, you gain a long-term partner in health. Find a Primary Care Provider  Learn more about [facility]'s in-office and virtual care options: [facility]

## 2020-12-05 ENCOUNTER — Ambulatory Visit
Admission: RE | Admit: 2020-12-05 | Discharge: 2020-12-05 | Disposition: A | Discharge status: Home / Self Care | Payer: Medicaid Other | Source: Ambulatory Visit | Attending: Nurse Practitioner | Admitting: Nurse Practitioner

## 2020-12-05 ENCOUNTER — Other Ambulatory Visit: Payer: Self-pay

## 2020-12-05 DIAGNOSIS — Z1231 Encounter for screening mammogram for malignant neoplasm of breast: Secondary | ICD-10-CM

## 2020-12-17 ENCOUNTER — Other Ambulatory Visit: Payer: Self-pay

## 2020-12-17 ENCOUNTER — Other Ambulatory Visit: Payer: Self-pay | Admitting: Nurse Practitioner

## 2020-12-17 MED ORDER — TRULICITY 0.75 MG/0.5ML ~~LOC~~ SOAJ
0.7500 mg | SUBCUTANEOUS | 2 refills | Status: DC
  Filled 2020-12-17 – 2020-12-18 (×2): qty 2, 28d supply, fill #0
  Filled 2021-01-19: qty 2, 28d supply, fill #1
  Filled 2021-02-19 – 2021-02-20 (×2): qty 2, 28d supply, fill #2

## 2020-12-18 ENCOUNTER — Other Ambulatory Visit: Payer: Self-pay

## 2021-01-19 ENCOUNTER — Other Ambulatory Visit: Payer: Self-pay

## 2021-01-20 ENCOUNTER — Other Ambulatory Visit: Payer: Self-pay

## 2021-01-20 ENCOUNTER — Encounter: Payer: Self-pay | Admitting: Nurse Practitioner

## 2021-01-21 ENCOUNTER — Other Ambulatory Visit: Payer: Self-pay | Admitting: Nurse Practitioner

## 2021-01-21 MED ORDER — PENICILLIN V POTASSIUM 500 MG PO TABS: 500.0000 mg | ORAL_TABLET | Freq: Four times a day (QID) | ORAL | 0 refills | Status: AC

## 2021-02-19 ENCOUNTER — Other Ambulatory Visit: Payer: Self-pay

## 2021-02-20 ENCOUNTER — Other Ambulatory Visit: Payer: Self-pay

## 2021-02-23 ENCOUNTER — Other Ambulatory Visit: Payer: Self-pay

## 2021-03-08 ENCOUNTER — Other Ambulatory Visit: Payer: Self-pay | Admitting: Nurse Practitioner

## 2021-03-08 MED ORDER — TRULICITY 0.75 MG/0.5ML ~~LOC~~ SOAJ
0.7500 mg | SUBCUTANEOUS | 2 refills | Status: DC
Start: 2021-03-08 — End: 2021-05-20
  Filled 2021-03-08: qty 2, 28d supply, fill #0
  Filled 2021-04-14: qty 2, 28d supply, fill #1
  Filled 2021-04-17: qty 2, 28d supply, fill #0

## 2021-03-08 NOTE — Telephone Encounter (Signed)
Requested Prescriptions  Pending Prescriptions Disp Refills   Dulaglutide (TRULICITY) 1.96 QI/2.9NL SOPN 2 mL 2    Sig: Inject 0.75 mg into the skin once a week.     Endocrinology:  Diabetes - GLP-1 Receptor Agonists Passed - 03/08/2021 12:29 AM      Passed - HBA1C is between 0 and 7.9 and within 180 days    HbA1c, POC (controlled diabetic range)  Date Value Ref Range Status  07/29/2020 8.9 (A) 0.0 - 7.0 % Final   Hgb A1c MFr Bld  Date Value Ref Range Status  10/29/2020 7.6 (H) 4.8 - 5.6 % Final    Comment:             Prediabetes: 5.7 - 6.4          Diabetes: >6.4          Glycemic control for adults with diabetes: <7.0          Passed - Valid encounter within last 6 months    Recent Outpatient Visits          4 months ago Type 2 diabetes mellitus with hyperglycemia, with long-term current use of insulin [facility])   [place], [place] W, NP   5 months ago Insulin dependent type 2 diabetes mellitus [facility])   [place], Annie Main L, RPH-CPP   6 months ago Insulin dependent type 2 diabetes mellitus [facility])   [place], Annie Main L, RPH-CPP   7 months ago Insulin dependent type 2 diabetes mellitus [facility])   [place], Vernia Buff, NP   10 months ago [place], [place], [place]

## 2021-03-09 ENCOUNTER — Other Ambulatory Visit: Payer: Self-pay | Admitting: Nurse Practitioner

## 2021-03-09 ENCOUNTER — Emergency Department (HOSPITAL_COMMUNITY)
Admission: EM | Admit: 2021-03-09 | Discharge: 2021-03-09 | Disposition: A | Discharge status: Home / Self Care | Payer: 59 | Attending: Emergency Medicine | Admitting: Emergency Medicine

## 2021-03-09 ENCOUNTER — Other Ambulatory Visit: Payer: Self-pay

## 2021-03-09 ENCOUNTER — Encounter (HOSPITAL_COMMUNITY): Payer: Self-pay

## 2021-03-09 DIAGNOSIS — Z79899 Other long term (current) drug therapy: Secondary | ICD-10-CM | POA: Diagnosis not present

## 2021-03-09 DIAGNOSIS — I10 Essential (primary) hypertension: Secondary | ICD-10-CM | POA: Insufficient documentation

## 2021-03-09 DIAGNOSIS — U071 COVID-19: Secondary | ICD-10-CM | POA: Diagnosis not present

## 2021-03-09 DIAGNOSIS — Z7984 Long term (current) use of oral hypoglycemic drugs: Secondary | ICD-10-CM | POA: Diagnosis not present

## 2021-03-09 DIAGNOSIS — Z794 Long term (current) use of insulin: Secondary | ICD-10-CM | POA: Insufficient documentation

## 2021-03-09 DIAGNOSIS — E119 Type 2 diabetes mellitus without complications: Secondary | ICD-10-CM | POA: Diagnosis not present

## 2021-03-09 DIAGNOSIS — R509 Fever, unspecified: Secondary | ICD-10-CM | POA: Diagnosis present

## 2021-03-09 LAB — RESP PANEL BY RT-PCR (FLU A&B, COVID) ARPGX2
Influenza A by PCR: NEGATIVE
Influenza B by PCR: NEGATIVE
SARS Coronavirus 2 by RT PCR: POSITIVE — AB

## 2021-03-09 MED ORDER — PAXLOVID (300/100) 20 X 150 MG & 10 X 100MG PO TBPK: ORAL_TABLET | ORAL | 0 refills | Status: DC

## 2021-03-09 MED ORDER — LANTUS SOLOSTAR 100 UNIT/ML ~~LOC~~ SOPN
28.0000 [IU] | PEN_INJECTOR | Freq: Every day | SUBCUTANEOUS | 0 refills | Status: DC
  Filled 2021-03-09 – 2021-03-10 (×2): qty 9, 32d supply, fill #0

## 2021-03-09 MED ORDER — ACETAMINOPHEN 500 MG PO TABS
1000.0000 mg | ORAL_TABLET | Freq: Once | ORAL | Status: AC
  Administered 2021-03-09: 05:00:00 1000 mg via ORAL
  Filled 2021-03-09: qty 2

## 2021-03-09 NOTE — Discharge Instructions (Addendum)
Treat any fever with Tylenol and/or ibuprofen. Push fluids to avoid dehydration. Take Paxlovid as prescribed.   Follow up with your doctor as needed. Return to the ED with any new or concerning symptoms.

## 2021-03-09 NOTE — ED Triage Notes (Signed)
Pt complains of headache and chills since yesterday.

## 2021-03-09 NOTE — ED Provider Notes (Signed)
[facility] DEPT Provider Note   CSN: [id number] Arrival date & time: 03/09/21  2409     History Chief Complaint  Patient presents with   Headache   Chills    Tonya Paul is a 45 y.o. female.  Patient to ED with symptoms of chills, fever, headache and sore throat since yesterday. No vomiting. No significant SOB or chest pain. She reports a history of prolonged QT, IDDM.   The history is provided by the patient. No language interpreter was used.  Headache Associated symptoms: fever, myalgias and sore throat   Associated symptoms: no cough, no diarrhea and no vomiting       Past Medical History:  Diagnosis Date   Diabetes mellitus without complication ([place])    Hemorrhoids 11/18/2015   Hypertension    Left breast mass 09/01/2015   Long Q-T syndrome 01/14/2017   Long QT syndrome   Palpitations 02/03/2016   PVCs seen on monitor   Vertigo     Patient Active Problem List   Diagnosis Date Noted   Pain of left patellofemoral joint 09/10/2020   Elevated coronary artery calcium score 01/04/2020   Hyperlipidemia 01/04/2020   Essential hypertension 06/14/2019   Dizziness 06/14/2019   Abdominal pain, chronic, epigastric    GERD (gastroesophageal reflux disease) 06/20/2017   Long Q-T syndrome 01/14/2017   Palpitations 02/03/2016   Uterine fibroid 12/11/2015   Family history of sudden cardiac death December 09, 2015   Hemorrhoids 11/18/2015   Left breast mass 09/01/2015   Obesity 09/01/2015   Insulin dependent type 2 diabetes mellitus ([place]) 04/16/2015    Past Surgical History:  Procedure Laterality Date   BREAST BIOPSY Left 09/03/2015   [place] Core bx, benign   ESOPHAGOGASTRODUODENOSCOPY (EGD) WITH PROPOFOL N/A 06/21/2017   Procedure: ESOPHAGOGASTRODUODENOSCOPY (EGD) WITH PROPOFOL;  Surgeon: Lin Landsman, MD;  Location: [facility];  Service: Gastroenterology;  Laterality: N/A;     OB History     Gravida  3   Para  2   Term  2    Preterm      AB  1   Living  1      SAB  1   IAB      Ectopic      Multiple      Live Births  2           Family History  Problem Relation Age of Onset   Hypertension Mother    Lung cancer Mother    Throat cancer Father    Diabetes Sister    Lupus Maternal Grandmother    Stroke Maternal Grandfather    Sudden Cardiac Death Daughter    Breast cancer Paternal Aunt     Social History   Tobacco Use   Smoking status: Never   Smokeless tobacco: Never  Vaping Use   Vaping Use: Never used  Substance Use Topics   Alcohol use: No   Drug use: No    Home Medications Prior to Admission medications   Medication Sig Start Date End Date Taking? Authorizing Provider  amoxicillin-clavulanate (AUGMENTIN) 875-125 MG tablet Take 1 tablet by mouth 2 (two) times daily. 12/04/20   Brunetta Jeans, PA-C  atorvastatin (LIPITOR) 20 MG tablet TAKE 1 TABLET (20 MG TOTAL) BY MOUTH DAILY. 07/29/20 07/29/21  Gildardo Pounds, NP  Blood Glucose Monitoring Suppl (TRUE METRIX METER) w/Device KIT Check blood glucose level by fingerstick twice per day. 10/29/20   Gildardo Pounds, NP  chlorhexidine (PERIDEX) 0.12 %  solution Use as directed 15 mLs in the mouth or throat 2 (two) times daily. 11/30/20   Kara Dies, NP  Dulaglutide (TRULICITY) 1.42 LT/5.3UY SOPN Inject 0.75 mg into the skin once a week. 03/08/21   Gildardo Pounds, NP  EPINEPHrine 0.3 mg/0.3 mL IJ SOAJ injection Inject 0.3 mLs (0.3 mg total) into the muscle as needed for anaphylaxis. 07/15/19   Rudene Re, MD  ferrous sulfate 325 (65 FE) MG tablet Take 1 tablet by mouth 2 (two) times daily. 08/05/20 04/06/21  Gildardo Pounds, NP  glucose blood (TRUE METRIX BLOOD GLUCOSE TEST) test strip Use as instructed 10/29/20   Gildardo Pounds, NP  insulin glargine (LANTUS) 100 UNIT/ML Solostar Pen Increase lantus to 30 units nightly. Increase by 2 units every 3 days for blood glucose level greater than 140. 10/29/20 04/04/21   Gildardo Pounds, NP  Insulin Pen Needle 31G X 5 MM MISC USE AS INSTRUCTED 04/30/20 04/30/21  Argentina Donovan, PA-C  metFORMIN (GLUCOPHAGE) 500 MG tablet TAKE 2 TABLETS (1,000 MG TOTAL) BY MOUTH 2 (TWO) TIMES DAILY WITH A MEAL. 07/29/20 07/29/21  Gildardo Pounds, NP  propranolol ER (INDERAL LA) 80 MG 24 hr capsule TAKE 1 CAPSULE BY MOUTH EVERY DAY 10/29/20 04/21/21  Gildardo Pounds, NP  TRUEplus Lancets 28G MISC Use as instructed. Check blood glucose level by fingerstick twice per day. 10/29/20   Gildardo Pounds, NP  lisinopril (ZESTRIL) 5 MG tablet Take 1 tablet (5 mg total) by mouth daily. 01/04/20 03/01/20  Lorretta Harp, MD    Allergies    Ace inhibitors, Aspirin, Rocephin [ceftriaxone], Ciprofloxacin, and Prednisone  Review of Systems   Review of Systems  Constitutional:  Positive for chills and fever.  HENT:  Positive for sore throat.   Respiratory:  Negative for cough and shortness of breath.   Cardiovascular:  Negative for chest pain.  Gastrointestinal:  Negative for diarrhea and vomiting.  Musculoskeletal:  Positive for myalgias.  Neurological:  Positive for headaches.   Physical Exam Updated Vital Signs BP (!) 143/85    Pulse (!) 112    Temp (!) 102 F (38.9 C) (Oral)    Resp 16    SpO2 100%   Physical Exam Vitals and nursing note reviewed.  Constitutional:      Appearance: She is well-developed.  HENT:     Head: Normocephalic.  Cardiovascular:     Rate and Rhythm: Normal rate and regular rhythm.     Heart sounds: No murmur heard. Pulmonary:     Effort: Pulmonary effort is normal.     Breath sounds: Normal breath sounds. No wheezing, rhonchi or rales.  Abdominal:     General: Bowel sounds are normal.     Palpations: Abdomen is soft.     Tenderness: There is no abdominal tenderness. There is no guarding or rebound.  Musculoskeletal:        General: Normal range of motion.     Cervical back: Normal range of motion and neck supple.  Skin:    General: Skin is warm  and dry.  Neurological:     General: No focal deficit present.     Mental Status: She is alert and oriented to person, place, and time.    ED Results / Procedures / Treatments   Labs (all labs ordered are listed, but only abnormal results are displayed) Labs Reviewed  RESP PANEL BY RT-PCR (FLU A&B, COVID) ARPGX2 - Abnormal; Notable for the following components:  Result Value   SARS Coronavirus 2 by RT PCR POSITIVE (*)    All other components within normal limits    EKG None  Radiology No results found.  Procedures Procedures   Medications Ordered in ED Medications  acetaminophen (TYLENOL) tablet 1,000 mg (1,000 mg Oral Given 03/09/21 0522)    ED Course  I have reviewed the triage vital signs and the nursing notes.  Pertinent labs & imaging results that were available during my care of the patient were reviewed by me and considered in my medical decision making (see chart for details).    MDM Rules/Calculators/A&P                         Patient to ED with ss/sxs as per HPI.   Well appearing, nontoxic. No hypoxia, SOB, CP. H/O DM. Will start paxlovid. Discussed with pharmacy regarding Paxlovid and h/o QT prolongation and confirmed no reported contraindication. EKG's on chart reviewed with with average QTc of 430.       Final Clinical Impression(s) / ED Diagnoses Final diagnoses:  None   COVID  Rx / DC Orders ED Discharge Orders     None        Charlann Lange, PA-C 03/09/21 0533    Maudie Flakes, MD 03/09/21 [phone]

## 2021-03-10 ENCOUNTER — Other Ambulatory Visit: Payer: Self-pay

## 2021-03-11 ENCOUNTER — Other Ambulatory Visit: Payer: Self-pay

## 2021-03-11 ENCOUNTER — Other Ambulatory Visit: Payer: Self-pay | Admitting: Pharmacist

## 2021-03-11 MED ORDER — INSULIN GLARGINE-YFGN 100 UNIT/ML ~~LOC~~ SOPN
PEN_INJECTOR | SUBCUTANEOUS | 0 refills | Status: DC
  Filled 2021-03-11: qty 9, 30d supply, fill #0

## 2021-03-12 ENCOUNTER — Other Ambulatory Visit: Payer: Self-pay

## 2021-03-25 ENCOUNTER — Encounter: Payer: Self-pay | Admitting: Nurse Practitioner

## 2021-03-26 ENCOUNTER — Other Ambulatory Visit: Payer: Self-pay | Admitting: Nurse Practitioner

## 2021-03-26 DIAGNOSIS — N644 Mastodynia: Secondary | ICD-10-CM

## 2021-03-30 ENCOUNTER — Ambulatory Visit: Payer: 59

## 2021-03-30 ENCOUNTER — Ambulatory Visit
Admission: RE | Admit: 2021-03-30 | Discharge: 2021-03-30 | Disposition: A | Discharge status: Home / Self Care | Payer: 59 | Source: Ambulatory Visit | Attending: Nurse Practitioner | Admitting: Nurse Practitioner

## 2021-03-30 DIAGNOSIS — N644 Mastodynia: Secondary | ICD-10-CM

## 2021-04-14 ENCOUNTER — Encounter: Payer: Self-pay | Admitting: Nurse Practitioner

## 2021-04-14 ENCOUNTER — Other Ambulatory Visit: Payer: Self-pay | Admitting: Nurse Practitioner

## 2021-04-14 ENCOUNTER — Other Ambulatory Visit: Payer: Self-pay | Admitting: Family Medicine

## 2021-04-14 MED ORDER — INSULIN GLARGINE-YFGN 100 UNIT/ML ~~LOC~~ SOLN: 30.0000 [IU] | Freq: Every day | SUBCUTANEOUS | 0 refills | Status: AC

## 2021-04-14 NOTE — Telephone Encounter (Signed)
Requested medication (s) are due for refill today:   Not sure Provider to review  Requested medication (s) are on the active medication list:   Yes  Future visit scheduled:   No   Last ordered: 03/11/2021 9 ml, 0 refills  Returned because there is not a protocol assigned to this medication.   Requested Prescriptions  Pending Prescriptions Disp Refills   insulin glargine-yfgn (SEMGLEE, YFGN,) 100 UNIT/ML Pen 9 mL 0    Sig: Increase lantus to 30 units nightly. Increase by 2 units every 3 days for blood glucose level greater than 140.     Off-Protocol Failed - 04/14/2021 11:50 AM      Failed - Medication not assigned to a protocol, review manually.      Passed - Valid encounter within last 12 months    Recent Outpatient Visits           5 months ago Type 2 diabetes mellitus with hyperglycemia, with long-term current use of insulin [facility])   [place], [place] W, NP   6 months ago Insulin dependent type 2 diabetes mellitus [facility])   [place], Annie Main L, RPH-CPP   7 months ago Insulin dependent type 2 diabetes mellitus [facility])   [place], Jarome [place]atin, RPH-CPP   8 months ago Insulin dependent type 2 diabetes mellitus [facility])   [place], Vernia Buff, NP   11 months ago [place], Dionne [place], [place]       Future Appointments             In 2 weeks Gwenlyn Found, Pearletha For[place], MD [place] [facility], [facility]

## 2021-04-17 ENCOUNTER — Other Ambulatory Visit: Payer: Self-pay

## 2021-04-22 ENCOUNTER — Ambulatory Visit (HOSPITAL_COMMUNITY): Payer: 59 | Attending: Cardiology

## 2021-04-22 ENCOUNTER — Other Ambulatory Visit: Payer: Self-pay

## 2021-04-22 DIAGNOSIS — R0602 Shortness of breath: Secondary | ICD-10-CM | POA: Insufficient documentation

## 2021-04-22 LAB — ECHOCARDIOGRAM COMPLETE
Area-P 1/2: 4.39 cm2
S' Lateral: 3.3 cm

## 2021-04-24 ENCOUNTER — Ambulatory Visit
Admission: EM | Admit: 2021-04-24 | Discharge: 2021-04-24 | Disposition: A | Discharge status: Home / Self Care | Payer: 59 | Attending: Physician Assistant | Admitting: Physician Assistant

## 2021-04-24 ENCOUNTER — Ambulatory Visit (INDEPENDENT_AMBULATORY_CARE_PROVIDER_SITE_OTHER): Payer: 59

## 2021-04-24 ENCOUNTER — Other Ambulatory Visit: Payer: Self-pay

## 2021-04-24 DIAGNOSIS — M5442 Lumbago with sciatica, left side: Secondary | ICD-10-CM

## 2021-04-24 DIAGNOSIS — M545 Low back pain, unspecified: Secondary | ICD-10-CM | POA: Diagnosis not present

## 2021-04-24 MED ORDER — CYCLOBENZAPRINE HCL 10 MG PO TABS: 10.0000 mg | ORAL_TABLET | Freq: Two times a day (BID) | ORAL | 0 refills | Status: DC | PRN

## 2021-04-24 NOTE — ED Provider Notes (Signed)
[name]    CSN: [id number] Arrival date & time: 04/24/21  1434      History   Chief Complaint Chief Complaint  Patient presents with   Back Pain    HPI Tonya Paul is a 46 y.o. female.   Patient here today for evaluation of low back pain that started this morning and has seemed to worsen through the day. Trying to stand up straight worsens pain. She has had some burning and tingling into her left leg and foot. She denies any known injury. She does note that she sits most of the time for her job. She has not had any loss of bowel or bladder function. She does not report any treatment for symptoms.   The history is provided by the patient.  Back Pain Associated symptoms: no abdominal pain, no fever and no numbness    Past Medical History:  Diagnosis Date   Diabetes mellitus without complication ([place])    Hemorrhoids 11/18/2015   Hypertension    Left breast mass 09/01/2015   Long Q-T syndrome 01/14/2017   Long QT syndrome   Palpitations 02/03/2016   PVCs seen on monitor   Vertigo     Patient Active Problem List   Diagnosis Date Noted   Pain of left patellofemoral joint 09/10/2020   Elevated coronary artery calcium score 01/04/2020   Hyperlipidemia 01/04/2020   Essential hypertension 06/14/2019   Dizziness 06/14/2019   Abdominal pain, chronic, epigastric    GERD (gastroesophageal reflux disease) 06/20/2017   Long Q-T syndrome 01/14/2017   Palpitations 02/03/2016   Uterine fibroid 12/11/2015   Family history of sudden cardiac death December 21, 2015   Hemorrhoids 11/18/2015   Left breast mass 09/01/2015   Obesity 09/01/2015   Insulin dependent type 2 diabetes mellitus ([place]) 04/16/2015    Past Surgical History:  Procedure Laterality Date   BREAST BIOPSY Left 09/03/2015   [place] Core bx, benign   ESOPHAGOGASTRODUODENOSCOPY (EGD) WITH PROPOFOL N/A 06/21/2017   Procedure: ESOPHAGOGASTRODUODENOSCOPY (EGD) WITH PROPOFOL;  Surgeon: Lin Landsman, MD;   Location: [place];  Service: Gastroenterology;  Laterality: N/A;    OB History     Gravida  3   Para  2   Term  2   Preterm      AB  1   Living  1      SAB  1   IAB      Ectopic      Multiple      Live Births  2            Home Medications    Prior to Admission medications   Medication Sig Start Date End Date Taking? Authorizing Provider  cyclobenzaprine (FLEXERIL) 10 MG tablet Take 1 tablet (10 mg total) by mouth 2 (two) times daily as needed for muscle spasms. 04/24/21  Yes Francene Finders, PA-C  amoxicillin-clavulanate (AUGMENTIN) 875-125 MG tablet Take 1 tablet by mouth 2 (two) times daily. 12/04/20   Brunetta Jeans, PA-C  atorvastatin (LIPITOR) 20 MG tablet TAKE 1 TABLET (20 MG TOTAL) BY MOUTH DAILY. 07/29/20 07/29/21  Gildardo Pounds, NP  Blood Glucose Monitoring Suppl (TRUE METRIX METER) w/Device KIT Check blood glucose level by fingerstick twice per day. 10/29/20   Gildardo Pounds, NP  chlorhexidine (PERIDEX) 0.12 % solution Use as directed 15 mLs in the mouth or throat 2 (two) times daily. 11/30/20   Leath-Warren, Alda Lea, NP  Dulaglutide (TRULICITY) 8.92 JJ/9.4RD SOPN Inject 0.75 mg into the  skin once a week. 03/08/21   Gildardo Pounds, NP  EPINEPHrine 0.3 mg/0.3 mL IJ SOAJ injection Inject 0.3 mLs (0.3 mg total) into the muscle as needed for anaphylaxis. 07/15/19   Rudene Re, MD  ferrous sulfate 325 (65 FE) MG tablet Take 1 tablet by mouth 2 (two) times daily. 08/05/20 04/11/21  Gildardo Pounds, NP  glucose blood (TRUE METRIX BLOOD GLUCOSE TEST) test strip Use as instructed 10/29/20   Gildardo Pounds, NP  insulin glargine-yfgn (SEMGLEE, YFGN,) 100 UNIT/ML injection Inject 0.3 mLs (30 Units total) into the skin at bedtime. 04/14/21 05/14/21  Gildardo Pounds, NP  Insulin Pen Needle 31G X 5 MM MISC USE AS INSTRUCTED 04/30/20 04/30/21  Argentina Donovan, PA-C  metFORMIN (GLUCOPHAGE) 500 MG tablet TAKE 2 TABLETS (1,000 MG TOTAL) BY MOUTH 2 (TWO)  TIMES DAILY WITH A MEAL. 07/29/20 07/29/21  Gildardo Pounds, NP  nirmatrelvir & ritonavir (PAXLOVID, 300/100,) 20 x 150 MG & 10 x $Re'100MG'ufR$  TBPK Each dose includes one nirmatrelvir and one ritonavir. Take one dose (2 tablets) twice daily for 5 days 03/09/21   Charlann Lange, PA-C  propranolol ER (INDERAL LA) 80 MG 24 hr capsule TAKE 1 CAPSULE BY MOUTH EVERY DAY 10/29/20 05/17/21  Gildardo Pounds, NP  TRUEplus Lancets 28G MISC Use as instructed. Check blood glucose level by fingerstick twice per day. 10/29/20   Gildardo Pounds, NP  lisinopril (ZESTRIL) 5 MG tablet Take 1 tablet (5 mg total) by mouth daily. 01/04/20 03/01/20  Lorretta Harp, MD    Family History Family History  Problem Relation Age of Onset   Hypertension Mother    Lung cancer Mother    Throat cancer Father    Diabetes Sister    Lupus Maternal Grandmother    Stroke Maternal Grandfather    Sudden Cardiac Death Daughter    Breast cancer Paternal Aunt     Social History Social History   Tobacco Use   Smoking status: Never   Smokeless tobacco: Never  Vaping Use   Vaping Use: Never used  Substance Use Topics   Alcohol use: No   Drug use: No     Allergies   Ace inhibitors, Aspirin, Rocephin [ceftriaxone], Ciprofloxacin, and Prednisone   Review of Systems Review of Systems  Constitutional:  Negative for chills and fever.  Eyes:  Negative for discharge and redness.  Respiratory:  Negative for shortness of breath.   Gastrointestinal:  Negative for abdominal pain, nausea and vomiting.  Genitourinary:  Positive for vaginal bleeding and vaginal discharge.  Neurological:  Negative for numbness.    Physical Exam Triage Vital Signs ED Triage Vitals [04/24/21 1444]  Enc Vitals Group     BP 126/83     Pulse Rate 79     Resp 18     Temp 97.8 F (36.6 C)     Temp Source Oral     SpO2 98 %     Weight      Height      Head Circumference      Peak Flow      Pain Score 0     Pain Loc      Pain Edu?      Excl. in  [place]?    No data found.  Updated Vital Signs BP 126/83 (BP Location: Right Arm)    Pulse 79    Temp 97.8 F (36.6 C) (Oral)    Resp 18    SpO2 98%  Physical Exam Vitals and nursing note reviewed.  Constitutional:      General: She is not in acute distress.    Appearance: Normal appearance. She is not ill-appearing.  HENT:     Head: Normocephalic and atraumatic.  Eyes:     Conjunctiva/sclera: Conjunctivae normal.  Cardiovascular:     Rate and Rhythm: Normal rate.  Pulmonary:     Effort: Pulmonary effort is normal.  Musculoskeletal:     Comments: No TTP to midline spine, mild TTP to paraspinal area of left low back with muscle spasm noted  Neurological:     Mental Status: She is alert.  Psychiatric:        Mood and Affect: Mood normal.        Behavior: Behavior normal.        Thought Content: Thought content normal.     UC Treatments / Results  Labs (all labs ordered are listed, but only abnormal results are displayed) Labs Reviewed - No data to display  EKG   Radiology DG Lumbar Spine Complete  Result Date: 04/24/2021 CLINICAL DATA:  Low back pain this morning on awakening. EXAM: LUMBAR SPINE - COMPLETE 4+ VIEW COMPARISON:  None. FINDINGS: There are 5 lumbar type vertebral bodies. The alignment is normal. The disc spaces are preserved. There is mild intervertebral spurring and facet hypertrophy. No evidence of acute fracture or pars defect. Metallic foreign body projecting over the pelvis is presumably external to the patient, but requires clinical correlation. IMPRESSION: No acute osseous findings. Mild lumbar spondylosis. Metallic density projecting over the pelvis, presumably external to the patient. Electronically Signed   By: Richardean Sale M.D.   On: 04/24/2021 15:23    Procedures Procedures (including critical care time)  Medications Ordered in UC Medications - No data to display  Initial Impression / Assessment and Plan / UC Course  I have reviewed the  triage vital signs and the nursing notes.  Pertinent labs & imaging results that were available during my care of the patient were reviewed by me and considered in my medical decision making (see chart for details).    Will treat to cover muscular strain with flexeril and recommended ibuprofen or naproxen to assist with anti-inflammation and pain. Encouraged slow controlled stretching, massage and that she avoid heavy lifting. Advised follow up if no gradual improvement.   Final Clinical Impressions(s) / UC Diagnoses   Final diagnoses:  Acute bilateral low back pain with left-sided sciatica   Discharge Instructions   None    ED Prescriptions     Medication Sig Dispense Auth. Provider   cyclobenzaprine (FLEXERIL) 10 MG tablet Take 1 tablet (10 mg total) by mouth 2 (two) times daily as needed for muscle spasms. 20 tablet Francene Finders, PA-C      PDMP not reviewed this encounter.   Francene Finders, PA-C 04/24/21 1621

## 2021-04-24 NOTE — ED Triage Notes (Signed)
Pt c/o lower back pain onset this morning when she woke up. States it has gotten worse throughout the day. Feels like it is burning, and c/o left foot numbness. Feels like "when you sit on your leg, and then you get back up."

## 2021-04-28 ENCOUNTER — Ambulatory Visit: Payer: 59 | Admitting: Cardiovascular Disease

## 2021-05-01 ENCOUNTER — Other Ambulatory Visit: Payer: Self-pay | Admitting: Nurse Practitioner

## 2021-05-01 ENCOUNTER — Encounter: Payer: Self-pay | Admitting: Nurse Practitioner

## 2021-05-01 ENCOUNTER — Ambulatory Visit: Payer: Self-pay

## 2021-05-01 ENCOUNTER — Other Ambulatory Visit: Payer: Self-pay | Admitting: Pharmacist

## 2021-05-01 ENCOUNTER — Telehealth: Payer: Self-pay | Admitting: Nurse Practitioner

## 2021-05-01 DIAGNOSIS — E119 Type 2 diabetes mellitus without complications: Secondary | ICD-10-CM

## 2021-05-01 MED ORDER — "INSULIN SYRINGE 29G X 1/2"" 0.5 ML MISC": 6 refills | Status: DC

## 2021-05-01 MED ORDER — "INSULIN SYRINGE-NEEDLE U-100 29G X 1/2"" 0.3 ML MISC": 2 refills | Status: DC

## 2021-05-01 NOTE — Telephone Encounter (Signed)
Refills for [facility]

## 2021-05-01 NOTE — Telephone Encounter (Signed)
°  Chief Complaint: syringes needed Symptoms: NA Frequency: hadnt taken Lantus in 3 days Pertinent Negatives: NA Disposition: [] ED /[] Urgent Care (no appt availability in office) / [] Appointment(In office/virtual)/ []  [facility]/ [] Home Care/ [] Refused Recommended Disposition /[] [facility]/ [x]  Follow-up with PCP Additional Notes: I was able to send [facility] a teams message about syringes for pt. Will route to provider as well.    Reason for Disposition  [1] Caller has URGENT medicine question about med that PCP or specialist prescribed AND [2] triager unable to answer question  Answer Assessment - Initial Assessment Questions 1. NAME of MEDICATION: "What medicine are you calling about?"     Lantus 2. QUESTION: "What is your question?" (e.g., double dose of medicine, side effect)     Vial was prescribed this time and pt doesn't have syringes since she uses the pen 3. PRESCRIBING HCP: "Who prescribed it?" Reason: if prescribed by specialist, call should be referred to that group.     Zelda, NP  Protocols used: Medication Question Call-A-AH

## 2021-05-01 NOTE — Telephone Encounter (Signed)
Pt is calling bc she received a vile of insulin insulin glargine-yfgn (SEMGLCBEE, YFGN,) 100 UNIT/ML injection [[id number]] Pt is in need of syringes Pt has not had her insulin in 3 days. Please advise [phone]  Preferred Pharmacy [facility].

## 2021-05-04 NOTE — Telephone Encounter (Signed)
Sent on 05/01/2021

## 2021-05-14 LAB — HM DIABETES EYE EXAM

## 2021-05-20 ENCOUNTER — Other Ambulatory Visit: Payer: Self-pay

## 2021-05-20 ENCOUNTER — Encounter: Payer: Self-pay | Admitting: Physician Assistant

## 2021-05-20 ENCOUNTER — Ambulatory Visit: Payer: Self-pay | Attending: Physician Assistant | Admitting: Physician Assistant

## 2021-05-20 VITALS — BP 136/86 | HR 54 | Wt 210.0 lb

## 2021-05-20 DIAGNOSIS — I10 Essential (primary) hypertension: Secondary | ICD-10-CM

## 2021-05-20 DIAGNOSIS — J302 Other seasonal allergic rhinitis: Secondary | ICD-10-CM

## 2021-05-20 DIAGNOSIS — T7840XS Allergy, unspecified, sequela: Secondary | ICD-10-CM

## 2021-05-20 DIAGNOSIS — Z794 Long term (current) use of insulin: Secondary | ICD-10-CM

## 2021-05-20 DIAGNOSIS — E1165 Type 2 diabetes mellitus with hyperglycemia: Secondary | ICD-10-CM

## 2021-05-20 DIAGNOSIS — E119 Type 2 diabetes mellitus without complications: Secondary | ICD-10-CM

## 2021-05-20 DIAGNOSIS — Z09 Encounter for follow-up examination after completed treatment for conditions other than malignant neoplasm: Secondary | ICD-10-CM

## 2021-05-20 DIAGNOSIS — M545 Low back pain, unspecified: Secondary | ICD-10-CM

## 2021-05-20 DIAGNOSIS — E785 Hyperlipidemia, unspecified: Secondary | ICD-10-CM

## 2021-05-20 DIAGNOSIS — D649 Anemia, unspecified: Secondary | ICD-10-CM

## 2021-05-20 LAB — POCT GLYCOSYLATED HEMOGLOBIN (HGB A1C): HbA1c, POC (controlled diabetic range): 7.9 % — AB (ref 0.0–7.0)

## 2021-05-20 LAB — GLUCOSE, POCT (MANUAL RESULT ENTRY): POC Glucose: 159 mg/dl — AB (ref 70–99)

## 2021-05-20 MED ORDER — METFORMIN HCL 500 MG PO TABS
ORAL_TABLET | Freq: Two times a day (BID) | ORAL | 1 refills | Status: DC
  Filled 2021-05-20: qty 120, 30d supply, fill #0
  Filled 2021-06-29: qty 120, 30d supply, fill #1

## 2021-05-20 MED ORDER — CYCLOBENZAPRINE HCL 10 MG PO TABS
10.0000 mg | ORAL_TABLET | Freq: Two times a day (BID) | ORAL | 0 refills | Status: DC | PRN
  Filled 2021-05-20: qty 40, 20d supply, fill #0

## 2021-05-20 MED ORDER — FERROUS SULFATE 325 (65 FE) MG PO TABS
ORAL_TABLET | Freq: Two times a day (BID) | ORAL | 1 refills | Status: DC
Start: 2021-05-20 — End: 2022-01-05
  Filled 2021-05-20: qty 60, 30d supply, fill #0

## 2021-05-20 MED ORDER — TRULICITY 0.75 MG/0.5ML ~~LOC~~ SOAJ
0.7500 mg | SUBCUTANEOUS | 2 refills | Status: DC
  Filled 2021-05-20: qty 2, 28d supply, fill #0
  Filled 2021-06-29: qty 2, 28d supply, fill #1
  Filled 2021-07-30: qty 2, 28d supply, fill #2

## 2021-05-20 MED ORDER — PROPRANOLOL HCL ER 80 MG PO CP24
ORAL_CAPSULE | Freq: Every day | ORAL | 1 refills | Status: DC
  Filled 2021-05-20: qty 30, 30d supply, fill #0
  Filled 2021-06-29: qty 30, 30d supply, fill #1
  Filled 2021-07-30: qty 30, 30d supply, fill #2

## 2021-05-20 MED ORDER — EPINEPHRINE 0.3 MG/0.3ML IJ SOAJ
0.3000 mg | INTRAMUSCULAR | 1 refills | Status: DC | PRN
  Filled 2021-05-20: qty 2, 28d supply, fill #0

## 2021-05-20 MED ORDER — ATORVASTATIN CALCIUM 20 MG PO TABS
ORAL_TABLET | Freq: Every day | ORAL | 3 refills | Status: DC
  Filled 2021-05-20: qty 30, 30d supply, fill #0

## 2021-05-20 MED ORDER — INSULIN GLARGINE-YFGN 100 UNIT/ML ~~LOC~~ SOPN
32.0000 [IU] | PEN_INJECTOR | Freq: Every day | SUBCUTANEOUS | 4 refills | Status: DC
  Filled 2021-05-20: qty 9, 28d supply, fill #0
  Filled 2021-06-29: qty 9, 28d supply, fill #1

## 2021-05-20 NOTE — Progress Notes (Signed)
Patient ID: Tonya Paul, female   DOB: [birth year], 46 y.o.   MRN: [id number] ? ? ? ? ?Tonya Paul, is a 46 y.o. female ? ?[id number] ? ?[id number] ? ?DOB - February 28, 1976 ? ?Chief Complaint  ?Patient presents with  ? Medication Management  ?    ? ?Subjective:  ? ?Tonya Paul is a 46 y.o. female here today for med RF.  She would like to do diabetic nutrition classes.  She does not check glucose on a regular basis.  Currently taking 8.29 trulicity, semglee 30 units daily and metformin 1000 bid.  Admits to poor diet and sedentary lifestyle.  She is starting a job that will be more active.   ? ? ?Seen at [facility] 04/24/2021 for back pain and she is now seeing a chiropractor.  They also advised she be more active and incorporate stretching: ? ?Patient here today for evaluation of low back pain that started this morning and has seemed to worsen through the day. Trying to stand up straight worsens pain. She has had some burning and tingling into her left leg and foot. She denies any known injury. She does note that she sits most of the time for her job. She has not had any loss of bowel or bladder function. She does not report any treatment for symptoms.  ?  ? ?Will treat to cover muscular strain with flexeril and recommended ibuprofen or naproxen to assist with anti-inflammation and pain. Encouraged slow controlled stretching, massage and that she avoid heavy lifting. Advised follow up if no gradual improvement ? ?No problems updated. ? ?ALLERGIES: ?Allergies  ?Allergen Reactions  ? Ace Inhibitors Shortness Of Breath  ?  Reported by the patient after a trial of lisinopril prescribes by PCP  ? Aspirin Anaphylaxis  ? Rocephin [Ceftriaxone] Itching  ? Ciprofloxacin Itching  ? Prednisone Palpitations  ? ? ?PAST MEDICAL HISTORY: ?Past Medical History:  ?Diagnosis Date  ? Diabetes mellitus without complication ([place])   ? Hemorrhoids 11/18/2015  ? Hypertension   ? Left breast mass 09/01/2015  ? 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  Long QT syndrome  ? Palpitations 02/03/2016  ? PVCs seen on monitor  ? Vertigo   ? ? ?MEDICATIONS AT HOME: ?Prior to Admission medications   ?Medication Sig Start Date End Date Taking? Authorizing Provider  ?insulin glargine-yfgn (SEMGLEE) 100 UNIT/ML Pen Inject 32 Units into the skin daily. 05/20/21  Yes Argentina Donovan, PA-C  ?atorvastatin (LIPITOR) 20 MG tablet TAKE 1 TABLET (20 MG TOTAL) BY MOUTH DAILY. 05/20/21 05/20/22  Argentina Donovan, PA-C  ?Blood Glucose Monitoring Suppl (TRUE METRIX METER) w/Device KIT Check blood glucose level by fingerstick twice per day. 10/29/20   Gildardo Pounds, NP  ?chlorhexidine (PERIDEX) 0.12 % solution Use as directed 15 mLs in the mouth or throat 2 (two) times daily. 11/30/20   Leath-Warren, Alda Lea, NP  ?cyclobenzaprine (FLEXERIL) 10 MG tablet Take 1 tablet (10 mg total) by mouth 2 (two) times daily as needed for muscle spasms. 05/20/21   Argentina Donovan, PA-C  ?Dulaglutide (TRULICITY) 9.37 JI/9.6VE SOPN Inject 0.75 mg into the skin once a week. 05/20/21   Argentina Donovan, PA-C  ?EPINEPHrine 0.3 mg/0.3 mL IJ SOAJ injection Inject 0.3 mg into the muscle as needed for anaphylaxis. 05/20/21   Argentina Donovan, PA-C  ?ferrous sulfate 325 (65 FE) MG tablet Take 1 tablet by mouth 2 (two) times daily. 05/20/21 08/18/21  Argentina Donovan, PA-C  ?glucose blood (TRUE METRIX BLOOD  GLUCOSE TEST) test strip Use as instructed 10/29/20   Gildardo Pounds, NP  ?INSULIN SYRINGE .5CC/29G (B-D INSULIN SYRINGE) 29G X 1/2" 0.5 ML MISC Use as instructed. Inject into the skin once daily 05/01/21   Gildardo Pounds, NP  ?Insulin Syringe-Needle U-100 29G X 1/2" 0.3 ML MISC Use to inject Semglee insulin. 05/01/21   Charlott Rakes, MD  ?metFORMIN (GLUCOPHAGE) 500 MG tablet TAKE 2 TABLETS (1,000 MG TOTAL) BY MOUTH 2 (TWO) TIMES DAILY WITH A MEAL. 05/20/21 05/20/22  Argentina Donovan, PA-C  ?propranolol ER (INDERAL LA) 80 MG 24 hr capsule TAKE 1 CAPSULE BY MOUTH EVERY DAY 05/20/21 08/18/21  Argentina Donovan, PA-C   ?TRUEplus Lancets 28G MISC Use as instructed. Check blood glucose level by fingerstick twice per day. 10/29/20   Gildardo Pounds, NP  ?lisinopril (ZESTRIL) 5 MG tablet Take 1 tablet (5 mg total) by mouth daily. 01/04/20 03/01/20  Lorretta Harp, MD  ? ? ?ROS: ?Neg HEENT ?Neg resp ?Neg cardiac ?Neg GI ?Neg GU ?Neg psych ?Neg neuro ? ?Objective:  ? ?Vitals:  ? 05/20/21 1013  ?BP: 136/86  ?Pulse: (!) 54  ?SpO2: 98%  ?Weight: 210 lb (95.3 kg)  ? ?Exam ?General appearance : Awake, alert, not in any distress. Speech Clear. Not toxic looking ?HEENT: Atraumatic and NormocephalicNeck: Supple, no JVD. No cervical lymphadenopathy.  ?Chest: Good air entry bilaterally, CTAB.  No rales/rhonchi/wheezing ?CVS: S1 S2 regular, no murmurs.  ?Extremities: B/L Lower Ext shows no edema, both legs are warm to touch ?Neurology: Awake alert, and oriented X 3, CN II-XII intact, Non focal ?Skin: No Rash ? ?Data Review ?Lab Results  ?Component Value Date  ? HGBA1C 7.9 (A) 05/20/2021  ? HGBA1C 7.6 (H) 10/29/2020  ? HGBA1C 8.9 (A) 07/29/2020  ? ? ?Assessment & Plan  ? ?1. Type 2 diabetes mellitus with hyperglycemia, with long-term current use of insulin ([place]) ?Not at goal-increase semglee from 30 to 32 units and she says she is committed to working on diet and exercise ?- Glucose (CBG) ?- HgB A1c ?- Comprehensive metabolic panel ?- CBC with Differential/Platelet ?- Dulaglutide (TRULICITY) 1.02 VO/5.3GU SOPN; Inject 0.75 mg into the skin once a week.  Dispense: 2 mL; Refill: 2 ?- Referral to Nutrition and Diabetes Services ?- insulin glargine-yfgn (SEMGLEE) 100 UNIT/ML Pen; Inject 32 Units into the skin daily.  Dispense: 15 mL; Refill: 4 ? ?2. Dyslipidemia, goal LDL below 70 ?- Lipid panel ?- CBC with Differential/Platelet ?- atorvastatin (LIPITOR) 20 MG tablet; TAKE 1 TABLET (20 MG TOTAL) BY MOUTH DAILY.  Dispense: 90 tablet; Refill: 3 ?- Referral to Nutrition and Diabetes Services ? ?3. Essential hypertension ?- CBC with  Differential/Platelet ?- propranolol ER (INDERAL LA) 80 MG 24 hr capsule; TAKE 1 CAPSULE BY MOUTH EVERY DAY  Dispense: 90 capsule; Refill: 1 ?- Referral to Nutrition and Diabetes Services ? ?4. Insulin dependent type 2 diabetes mellitus ([facility]) ?- atorvastatin (LIPITOR) 20 MG tablet; TAKE 1 TABLET (20 MG TOTAL) BY MOUTH DAILY.  Dispense: 90 tablet; Refill: 3 ?- metFORMIN (GLUCOPHAGE) 500 MG tablet; TAKE 2 TABLETS (1,000 MG TOTAL) BY MOUTH 2 (TWO) TIMES DAILY WITH A MEAL.  Dispense: 270 tablet; Refill: 1 ?- Referral to Nutrition and Diabetes Services ? ?5. Anemia, unspecified type ?- CBC with Differential/Platelet ?- ferrous sulfate 325 (65 FE) MG tablet; Take 1 tablet by mouth 2 (two) times daily.  Dispense: 180 tablet; Refill: 1 ? ?6. Acute bilateral low back pain without sciatica ?- cyclobenzaprine (FLEXERIL) 10  MG tablet; Take 1 tablet (10 mg total) by mouth 2 (two) times daily as needed for muscle spasms.  Dispense: 40 tablet; Refill: 0 ? ?7. Encounter for examination following treatment at hospital ? ?8. Allergic reaction, sequela ?- EPINEPHrine 0.3 mg/0.3 mL IJ SOAJ injection; Inject 0.3 mg into the muscle as needed for anaphylaxis.  Dispense: 2 each; Refill: 1 ? ? ? ?Patient have been counseled extensively about nutrition and exercise. Other issues discussed during this visit include: low cholesterol diet, weight control and daily exercise, foot care, annual eye examinations at Ophthalmology, importance of adherence with medications and regular follow-up. We also discussed long term complications of uncontrolled diabetes and hypertension.  ? ?Return in about 3 months (around 08/20/2021). ? ?The patient was given clear instructions to go to ER or return to medical center if symptoms don't improve, worsen or new problems develop. The patient verbalized understanding. The patient was told to call to get lab results if they haven't heard anything in the next week.  ? ? ? ? ?Freeman Caldron, PA-C ?[facility] ?[place], [place] ?[phone]   ?05/20/2021, 10:39 AM  ?

## 2021-05-21 LAB — CBC WITH DIFFERENTIAL/PLATELET
Basophils Absolute: 0 10*3/uL (ref 0.0–0.2)
Basos: 0 %
EOS (ABSOLUTE): 0.5 10*3/uL — ABNORMAL HIGH (ref 0.0–0.4)
Eos: 6 %
Hematocrit: 33.5 % — ABNORMAL LOW (ref 34.0–46.6)
Hemoglobin: 11.2 g/dL (ref 11.1–15.9)
Immature Grans (Abs): 0 10*3/uL (ref 0.0–0.1)
Immature Granulocytes: 0 %
Lymphocytes Absolute: 2.6 10*3/uL (ref 0.7–3.1)
Lymphs: 33 %
MCH: 25.3 pg — ABNORMAL LOW (ref 26.6–33.0)
MCHC: 33.4 g/dL (ref 31.5–35.7)
MCV: 76 fL — ABNORMAL LOW (ref 79–97)
Monocytes Absolute: 0.8 10*3/uL (ref 0.1–0.9)
Monocytes: 10 %
Neutrophils Absolute: 4 10*3/uL (ref 1.4–7.0)
Neutrophils: 51 %
Platelets: 483 10*3/uL — ABNORMAL HIGH (ref 150–450)
RBC: 4.42 x10E6/uL (ref 3.77–5.28)
RDW: 15.2 % (ref 11.7–15.4)
WBC: 7.8 10*3/uL (ref 3.4–10.8)

## 2021-05-21 LAB — COMPREHENSIVE METABOLIC PANEL
ALT: 18 IU/L (ref 0–32)
AST: 17 IU/L (ref 0–40)
Albumin/Globulin Ratio: 1.5 (ref 1.2–2.2)
Albumin: 4.5 g/dL (ref 3.8–4.8)
Alkaline Phosphatase: 74 IU/L (ref 44–121)
BUN/Creatinine Ratio: 11 (ref 9–23)
BUN: 10 mg/dL (ref 6–24)
Bilirubin Total: <0.2 mg/dL (ref 0.0–1.2)
CO2: 19 mmol/L — ABNORMAL LOW (ref 20–29)
Calcium: 9.6 mg/dL (ref 8.7–10.2)
Chloride: 108 mmol/L — ABNORMAL HIGH (ref 96–106)
Creatinine, Ser: 0.9 mg/dL (ref 0.57–1.00)
Globulin, Total: 3.1 g/dL (ref 1.5–4.5)
Glucose: 150 mg/dL — ABNORMAL HIGH (ref 70–99)
Potassium: 4.6 mmol/L (ref 3.5–5.2)
Sodium: 141 mmol/L (ref 134–144)
Total Protein: 7.6 g/dL (ref 6.0–8.5)
eGFR: 80 mL/min/{1.73_m2} (ref >59)

## 2021-05-21 LAB — LIPID PANEL
Chol/HDL Ratio: 3.9 ratio (ref 0.0–4.4)
Cholesterol, Total: 149 mg/dL (ref 100–199)
HDL: 38 mg/dL — ABNORMAL LOW (ref >39)
LDL Chol Calc (NIH): 97 mg/dL (ref 0–99)
Triglycerides: 68 mg/dL (ref 0–149)
VLDL Cholesterol Cal: 14 mg/dL (ref 5–40)

## 2021-05-22 ENCOUNTER — Other Ambulatory Visit: Payer: Self-pay

## 2021-05-28 ENCOUNTER — Ambulatory Visit: Payer: Medicaid Other | Admitting: Dietician

## 2021-06-29 ENCOUNTER — Other Ambulatory Visit: Payer: Self-pay | Admitting: Pharmacist

## 2021-06-29 ENCOUNTER — Other Ambulatory Visit: Payer: Self-pay

## 2021-06-29 MED ORDER — BASAGLAR KWIKPEN 100 UNIT/ML ~~LOC~~ SOPN: 32.0000 [IU] | PEN_INJECTOR | Freq: Every day | SUBCUTANEOUS | 1 refills | Status: DC

## 2021-06-29 MED ORDER — BASAGLAR KWIKPEN 100 UNIT/ML ~~LOC~~ SOPN
32.0000 [IU] | PEN_INJECTOR | Freq: Every day | SUBCUTANEOUS | 1 refills | Status: DC
  Filled 2021-06-29: qty 9, 28d supply, fill #0
  Filled 2021-07-30: qty 9, 28d supply, fill #1

## 2021-07-02 ENCOUNTER — Other Ambulatory Visit: Payer: Self-pay

## 2021-07-15 ENCOUNTER — Ambulatory Visit: Payer: Medicaid Other | Admitting: Registered"

## 2021-07-22 ENCOUNTER — Ambulatory Visit
Admission: RE | Admit: 2021-07-22 | Discharge: 2021-07-22 | Disposition: A | Discharge status: Home / Self Care | Payer: Medicaid Other | Source: Ambulatory Visit | Attending: Nurse Practitioner | Admitting: Nurse Practitioner

## 2021-07-22 VITALS — BP 146/96 | HR 82 | Temp 98.1°F | Resp 20

## 2021-07-22 DIAGNOSIS — R222 Localized swelling, mass and lump, trunk: Secondary | ICD-10-CM

## 2021-07-22 NOTE — Discharge Instructions (Signed)
At this time, I believe that the mass that you are feeling along the right side of your rib cage is not concernin for anything more than a swollen muscle.  Because the mass is less than 24 hours old, my best recommendation is watchful waiting.  Please cut back on your caffeine intake, monitor the mass throughout your period and for about a week afterward.  If after or during this time you feel that the mass is getting larger, more painful, begins to feel lumpy please reach out to your primary care provider to request mammogram and ultrasound of your right breast. ? ?Thank you for visiting urgent care today.  I appreciate the opportunity to participate in your care. ?

## 2021-07-22 NOTE — ED Triage Notes (Signed)
Pt reports she has a lump beneath her right  armpit. She states the lump is moveable and does not hurt when touched. ?

## 2021-07-22 NOTE — ED Provider Notes (Signed)
?[facility] ? ? ? ?CSN: [id number] ?Arrival date & time: 07/22/21  1023 ?  ? ?HISTORY  ? ?Chief Complaint  ?Patient presents with  ? Breast Problem  ?  I found a lump beneath my armpit - Entered by patient  ? ?HPI ?Tonya Paul is a 46 y.o. female. Patient reports finding a lump beneath her right armpit chest wall today.  Patient states that the lump is movable and does not hurt when she touches it.  Patient states that she has annual mammograms every year because she has had some abnormal lesions found in the past, patient states all cytology has been benign.  Patient states she was moving heavy boxes last weekend, states the area became apparent last night.  Patient states she is here to have it evaluated. ? ?The history is provided by the patient.  ?Past Medical History:  ?Diagnosis Date  ? Diabetes mellitus without complication ([place])   ? Hemorrhoids 11/18/2015  ? Hypertension   ? Left breast mass 09/01/2015  ? 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 Long QT syndrome  ? Palpitations 02/03/2016  ? PVCs seen on monitor  ? Vertigo   ? ?Patient Active Problem List  ? Diagnosis Date Noted  ? Pain of left patellofemoral joint 09/10/2020  ? Elevated coronary artery calcium score 01/04/2020  ? Hyperlipidemia 01/04/2020  ? Essential hypertension 06/14/2019  ? Dizziness 06/14/2019  ? Abdominal pain, chronic, epigastric   ? GERD (gastroesophageal reflux disease) 06/20/2017  ? 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 Palpitations 02/03/2016  ? Uterine fibroid 12/11/2015  ? Family history of sudden cardiac death December 14, 2015  ? Hemorrhoids 11/18/2015  ? Left breast mass 09/01/2015  ? Obesity 09/01/2015  ? Insulin dependent type 2 diabetes mellitus ([place]) 04/16/2015  ? ?Past Surgical History:  ?Procedure Laterality Date  ? BREAST BIOPSY Left 09/03/2015  ? [place] Core bx, benign  ? ESOPHAGOGASTRODUODENOSCOPY (EGD) WITH PROPOFOL N/A 06/21/2017  ? Procedure: ESOPHAGOGASTRODUODENOSCOPY (EGD) WITH PROPOFOL;  Surgeon: Lin Landsman, MD;   Location[facility] ENDOSCOPY;  Service: Gastroenterology;  Laterality: N/A;  ? ?OB History   ? ? Gravida  ?3  ? Para  ?2  ? Term  ?2  ? Preterm  ?   ? AB  ?1  ? Living  ?1  ?  ? ? SAB  ?1  ? IAB  ?   ? Ectopic  ?   ? Multiple  ?   ? Live Births  ?2  ?   ?  ?  ? ?Home Medications   ? ?Prior to Admission medications   ?Medication Sig Start Date End Date Taking? Authorizing Provider  ?atorvastatin (LIPITOR) 20 MG tablet TAKE 1 TABLET (20 MG TOTAL) BY MOUTH DAILY. 05/20/21 05/20/22  Argentina Donovan, PA-C  ?Blood Glucose Monitoring Suppl (TRUE METRIX METER) w/Device KIT Check blood glucose level by fingerstick twice per day. 10/29/20   Gildardo Pounds, NP  ?chlorhexidine (PERIDEX) 0.12 % solution Use as directed 15 mLs in the mouth or throat 2 (two) times daily. 11/30/20   Leath-Warren, Alda Lea, NP  ?cyclobenzaprine (FLEXERIL) 10 MG tablet Take 1 tablet (10 mg total) by mouth 2 (two) times daily as needed for muscle spasms. 05/20/21   Argentina Donovan, PA-C  ?Dulaglutide (TRULICITY) 0.48 GQ/9.1QX SOPN Inject 0.75 mg into the skin once a week. 05/20/21   Argentina Donovan, PA-C  ?EPINEPHrine 0.3 mg/0.3 mL IJ SOAJ injection Inject 0.3 mg into the muscle as needed for anaphylaxis. 05/20/21  Argentina Donovan, PA-C  ?ferrous sulfate 325 (65 FE) MG tablet Take 1 tablet by mouth 2 (two) times daily. 05/20/21 08/18/21  Argentina Donovan, PA-C  ?glucose blood (TRUE METRIX BLOOD GLUCOSE TEST) test strip Use as instructed 10/29/20   Gildardo Pounds, NP  ?Insulin Glargine (BASAGLAR KWIKPEN) 100 UNIT/ML Inject 32 Units into the skin daily. 06/29/21   Charlott Rakes, MD  ?INSULIN SYRINGE .5CC/29G (B-D INSULIN SYRINGE) 29G X 1/2" 0.5 ML MISC Use as instructed. Inject into the skin once daily 05/01/21   Gildardo Pounds, NP  ?Insulin Syringe-Needle U-100 29G X 1/2" 0.3 ML MISC Use to inject Semglee insulin. 05/01/21   Charlott Rakes, MD  ?metFORMIN (GLUCOPHAGE) 500 MG tablet TAKE 2 TABLETS (1,000 MG TOTAL) BY MOUTH 2 (TWO) TIMES DAILY WITH A  MEAL. 05/20/21 05/20/22  Argentina Donovan, PA-C  ?propranolol ER (INDERAL LA) 80 MG 24 hr capsule TAKE 1 CAPSULE BY MOUTH EVERY DAY 05/20/21 08/18/21  Argentina Donovan, PA-C  ?TRUEplus Lancets 28G MISC Use as instructed. Check blood glucose level by fingerstick twice per day. 10/29/20   Gildardo Pounds, NP  ?lisinopril (ZESTRIL) 5 MG tablet Take 1 tablet (5 mg total) by mouth daily. 01/04/20 03/01/20  Lorretta Harp, MD  ? ? ?Family History ?Family History  ?Problem Relation Age of Onset  ? Hypertension Mother   ? Lung cancer Mother   ? Throat cancer Father   ? Diabetes Sister   ? Lupus Maternal Grandmother   ? Stroke Maternal Grandfather   ? Sudden Cardiac Death Daughter   ? Breast cancer Paternal Aunt   ? ?Social History ?Social History  ? ?Tobacco Use  ? Smoking status: Never  ? Smokeless tobacco: Never  ?Vaping Use  ? Vaping Use: Never used  ?Substance Use Topics  ? Alcohol use: No  ? Drug use: No  ? ?Allergies   ?Ace inhibitors, Aspirin, Rocephin [ceftriaxone], Ciprofloxacin, and Prednisone ? ?Review of Systems ?Review of Systems ?Pertinent findings noted in history of present illness.  ? ?Physical Exam ?Triage Vital Signs ?ED Triage Vitals  ?Enc Vitals Group  ?   BP 01/16/21 0827 (!) 147/82  ?   Pulse Rate 01/16/21 0827 72  ?   Resp 01/16/21 0827 18  ?   Temp 01/16/21 0827 98.3 ?F (36.8 ?C)  ?   Temp Source 01/16/21 0827 Oral  ?   SpO2 01/16/21 0827 98 %  ?   Weight --   ?   Height --   ?   Head Circumference --   ?   Peak Flow --   ?   Pain Score 01/16/21 0826 5  ?   Pain Loc --   ?   Pain Edu? --   ?   Excl. in [place]? --   ?No data found. ? ?Updated Vital Signs ?BP (!) 146/96   Pulse 82   Temp 98.1 ?F (36.7 ?C)   Resp 20   LMP 07/21/2021   SpO2 98%  ? ?Physical Exam ?Vitals and nursing note reviewed.  ?Constitutional:   ?   General: She is not in acute distress. ?   Appearance: Normal appearance. She is not ill-appearing.  ?HENT:  ?   Head: Normocephalic and atraumatic.  ?Eyes:  ?   General: Lids are  normal.     ?   Right eye: No discharge.     ?   Left eye: No discharge.  ?   Extraocular Movements: Extraocular movements  intact.  ?   Conjunctiva/sclera: Conjunctivae normal.  ?   Right eye: Right conjunctiva is not injected.  ?   Left eye: Left conjunctiva is not injected.  ?Neck:  ?   Trachea: Trachea and phonation normal.  ?Cardiovascular:  ?   Rate and Rhythm: Normal rate and regular rhythm.  ?   Pulses: Normal pulses.  ?   Heart sounds: Normal heart sounds. No murmur heard. ?  No friction rub. No gallop.  ?Pulmonary:  ?   Effort: Pulmonary effort is normal. No accessory muscle usage, prolonged expiration or respiratory distress.  ?   Breath sounds: Normal breath sounds. No stridor, decreased air movement or transmitted upper airway sounds. No decreased breath sounds, wheezing, rhonchi or rales.  ?Chest:  ?   Chest wall: No tenderness.  ? ? ?   Comments: Nontender palpable mass that is smooth, not mobile, tracks along the sixth rib on the right extends slightly under right breast. ?Musculoskeletal:     ?   General: Normal range of motion.  ?   Cervical back: Normal range of motion and neck supple. Normal range of motion.  ?Lymphadenopathy:  ?   Cervical: No cervical adenopathy.  ?Skin: ?   General: Skin is warm and dry.  ?   Findings: No erythema or rash.  ?Neurological:  ?   General: No focal deficit present.  ?   Mental Status: She is alert and oriented to person, place, and time.  ?Psychiatric:     ?   Mood and Affect: Mood normal.     ?   Behavior: Behavior normal.  ? ? ?Visual Acuity ?Right Eye Distance:   ?Left Eye Distance:   ?Bilateral Distance:   ? ?Right Eye Near:   ?Left Eye Near:    ?Bilateral Near:    ? ?UC Couse / Diagnostics / Procedures:  ?  ?EKG ? ?Radiology ?No results found. ? ?Procedures ?Procedures (including critical care time) ? ?UC Diagnoses / Final Clinical Impressions(s)   ?I have reviewed the triage vital signs and the nursing notes. ? ?Pertinent labs & imaging results that were  available during my care of the patient were reviewed by me and considered in my medical decision making (see chart for details).   ? ?Final diagnoses:  ?Mass of chest wall, right  ? ?Patient states that today is the first

## 2021-07-23 ENCOUNTER — Encounter: Payer: Self-pay | Admitting: Nurse Practitioner

## 2021-07-30 ENCOUNTER — Other Ambulatory Visit: Payer: Self-pay

## 2021-07-31 ENCOUNTER — Other Ambulatory Visit: Payer: Self-pay

## 2021-08-21 ENCOUNTER — Encounter: Payer: Self-pay | Admitting: Nurse Practitioner

## 2021-08-21 ENCOUNTER — Other Ambulatory Visit: Payer: Self-pay

## 2021-08-21 ENCOUNTER — Ambulatory Visit: Payer: Medicaid Other | Attending: Nurse Practitioner | Admitting: Nurse Practitioner

## 2021-08-21 VITALS — BP 154/95 | HR 71 | Wt 211.0 lb

## 2021-08-21 DIAGNOSIS — I10 Essential (primary) hypertension: Secondary | ICD-10-CM

## 2021-08-21 DIAGNOSIS — Z794 Long term (current) use of insulin: Secondary | ICD-10-CM

## 2021-08-21 DIAGNOSIS — N6331 Unspecified lump in axillary tail of the right breast: Secondary | ICD-10-CM

## 2021-08-21 DIAGNOSIS — F419 Anxiety disorder, unspecified: Secondary | ICD-10-CM

## 2021-08-21 DIAGNOSIS — R2231 Localized swelling, mass and lump, right upper limb: Secondary | ICD-10-CM

## 2021-08-21 DIAGNOSIS — E785 Hyperlipidemia, unspecified: Secondary | ICD-10-CM

## 2021-08-21 DIAGNOSIS — D649 Anemia, unspecified: Secondary | ICD-10-CM

## 2021-08-21 DIAGNOSIS — E1165 Type 2 diabetes mellitus with hyperglycemia: Secondary | ICD-10-CM

## 2021-08-21 LAB — POCT GLYCOSYLATED HEMOGLOBIN (HGB A1C): HbA1c, POC (controlled diabetic range): 8.1 % — AB (ref 0.0–7.0)

## 2021-08-21 LAB — GLUCOSE, POCT (MANUAL RESULT ENTRY): POC Glucose: 148 mg/dl — AB (ref 70–99)

## 2021-08-21 MED ORDER — TRULICITY 1.5 MG/0.5ML ~~LOC~~ SOAJ
1.5000 mg | SUBCUTANEOUS | 3 refills | Status: DC
  Filled 2021-08-21: qty 2, 28d supply, fill #0
  Filled 2021-09-23: qty 2, 28d supply, fill #1
  Filled 2021-10-20: qty 2, 28d supply, fill #2
  Filled 2021-11-19: qty 2, 28d supply, fill #3

## 2021-08-21 MED ORDER — PROPRANOLOL HCL ER 80 MG PO CP24
ORAL_CAPSULE | Freq: Every day | ORAL | 1 refills | Status: DC
  Filled 2021-08-21: qty 30, 30d supply, fill #0

## 2021-08-21 MED ORDER — METFORMIN HCL 500 MG PO TABS
ORAL_TABLET | Freq: Two times a day (BID) | ORAL | 1 refills | Status: DC
  Filled 2021-08-21: qty 120, 30d supply, fill #0
  Filled 2021-09-23: qty 120, 30d supply, fill #1
  Filled 2021-10-20: qty 120, 30d supply, fill #2
  Filled 2021-11-19: qty 120, 30d supply, fill #3
  Filled 2022-03-17: qty 60, 15d supply, fill #4

## 2021-08-21 MED ORDER — BASAGLAR KWIKPEN 100 UNIT/ML ~~LOC~~ SOPN
32.0000 [IU] | PEN_INJECTOR | Freq: Every day | SUBCUTANEOUS | 1 refills | Status: DC
  Filled 2021-08-21: qty 9, 28d supply, fill #0
  Filled 2021-09-23: qty 9, 28d supply, fill #1
  Filled 2021-10-20: qty 9, 28d supply, fill #2
  Filled 2021-11-19: qty 9, 28d supply, fill #3

## 2021-08-21 MED ORDER — ATORVASTATIN CALCIUM 20 MG PO TABS
ORAL_TABLET | Freq: Every day | ORAL | 3 refills | Status: DC
  Filled 2021-08-21: qty 30, 30d supply, fill #0
  Filled 2021-09-23: qty 30, 30d supply, fill #1
  Filled 2021-11-19: qty 30, 30d supply, fill #2
  Filled 2022-03-11 (×2): qty 30, 30d supply, fill #3
  Filled 2022-04-18 – 2022-04-19 (×2): qty 30, 30d supply, fill #4

## 2021-08-21 NOTE — Progress Notes (Signed)
Assessment & Plan:  Tonya Paul was seen today for diabetes, hypertension and medication refill.  Diagnoses and all orders for this visit:  Type 2 diabetes mellitus with hyperglycemia, with long-term current use of insulin (HCC) -     CMP14+EGFR -     POCT glucose (manual entry) -     POCT glycosylated hemoglobin (Hb A1C) -     Insulin Glargine (BASAGLAR KWIKPEN) 100 UNIT/ML; Inject 32 Units into the skin daily. -     metFORMIN (GLUCOPHAGE) 500 MG tablet; TAKE 2 TABLETS (1,000 MG TOTAL) BY MOUTH 2 (TWO) TIMES DAILY WITH A MEAL. -     Dulaglutide (TRULICITY) 1.5 MG/0.5ML SOPN; Inject 1.5 mg into the skin once a week.  Essential hypertension -     propranolol ER (INDERAL LA) 80 MG 24 hr capsule; TAKE 1 CAPSULE BY MOUTH EVERY DAY  Dyslipidemia, goal LDL below 70 -     atorvastatin (LIPITOR) 20 MG tablet; TAKE 1 TABLET (20 MG TOTAL) BY MOUTH DAILY.  Lump of axillary tail of right breast -     US BREAST COMPLETE UNI RIGHT INC AXILLA; Future  Anemia, unspecified type -     CBC  Anxiety -     Ambulatory referral to [facility] Declines anxiolytic. Also associated with panic attacks when driving    Patient has been counseled on age-appropriate routine health concerns for screening and prevention. These are reviewed and up-to-date. Referrals have been placed accordingly. Immunizations are up-to-date or declined.    Subjective:   Chief Complaint  Patient presents with   Diabetes   Hypertension   Medication Refill    Metformin,trulitiy,insulin, and propranolol   HPI Tonya Paul 46 y.o. female presents to office today for follow up  to DM and HTN.  She was seen in the ED a few weeks ago for a concern of right lower axillary lump. Today she reports the lump is still present and slightly tender. It has not increased is size however she is concerned due to its presence.   DM Poorly controlled. She does endorse dietary nonadherence (late night snacking, Late night  snacking, sweets, foods that are not low in fat or cholesterol. Also her current job is working from home, sedentary and she has not been exercising. Currently prescribed basaglar 32 units daily, metformin 1000 mg BID and today Trulicity has been increased to 1.5 mg weekly Lab Results  Component Value Date   HGBA1C 8.1 (A) 08/21/2021    Lab Results  Component Value Date   LDLCALC 97 05/20/2021   HTN Blood pressure is elevated. Will increase propranolol from 80 mg to 120 mg. Blood pressure medications are limited due to prolonged QT interval BP Readings from Last 3 Encounters:  08/21/21 (!) 154/95  07/22/21 (!) 146/96  05/20/21 136/86   Anemia She endorses heavy menstrual cycles and associated dizziness. Will obtain pelvic US once she has applied for the [facility] financial assistance. She has seen GYN for this in the past. Per note: Discussed options: IUD, OCP, Lysteda, Ablation, hysterectomy. Patient would like to think of options. She was lost to follow up.  Lab Results  Component Value Date   WBC 7.1 08/21/2021   HGB 10.6 (L) 08/21/2021   HCT 33.1 (L) 08/21/2021   MCV 76 (L) 08/21/2021   PLT 540 (H) 08/21/2021     Review of Systems  Constitutional:  Negative for fever, malaise/fatigue and weight loss.  HENT: Negative.  Negative for nosebleeds.   Eyes: Negative.  Negative for blurred vision, double vision and photophobia.  Respiratory: Negative.  Negative for cough and shortness of breath.   Cardiovascular: Negative.  Negative for chest pain, palpitations and leg swelling.  Gastrointestinal: Negative.  Negative for heartburn, nausea and vomiting.  Musculoskeletal: Negative.  Negative for myalgias.  Neurological:  Positive for dizziness. Negative for focal weakness, seizures and headaches.  Psychiatric/Behavioral:  Negative for suicidal ideas. The patient is nervous/anxious.     Past Medical History:  Diagnosis Date   Diabetes mellitus without complication ([place])    Hemorrhoids  11/18/2015   Hypertension    Left breast mass 09/01/2015   Long Q-T syndrome 01/14/2017   Long QT syndrome   Palpitations 02/03/2016   PVCs seen on monitor   Vertigo     Past Surgical History:  Procedure Laterality Date   BREAST BIOPSY Left 09/03/2015   [place] Core bx, benign   ESOPHAGOGASTRODUODENOSCOPY (EGD) WITH PROPOFOL N/A 06/21/2017   Procedure: ESOPHAGOGASTRODUODENOSCOPY (EGD) WITH PROPOFOL;  Surgeon: Lin Landsman, MD;  Location: [place];  Service: Gastroenterology;  Laterality: N/A;    Family History  Problem Relation Age of Onset   Hypertension Mother    Lung cancer Mother    Throat cancer Father    Diabetes Sister    Lupus Maternal Grandmother    Stroke Maternal Grandfather    Sudden Cardiac Death Daughter    Breast cancer Paternal Aunt     Social History Reviewed with no changes to be made today.   Outpatient Medications Prior to Visit  Medication Sig Dispense Refill   Blood Glucose Monitoring Suppl (TRUE METRIX METER) w/Device KIT Check blood glucose level by fingerstick twice per day. 1 kit 0   chlorhexidine (PERIDEX) 0.12 % solution Use as directed 15 mLs in the mouth or throat 2 (two) times daily. 120 mL 0   cyclobenzaprine (FLEXERIL) 10 MG tablet Take 1 tablet (10 mg total) by mouth 2 (two) times daily as needed for muscle spasms. 40 tablet 0   EPINEPHrine 0.3 mg/0.3 mL IJ SOAJ injection Inject 0.3 mg into the muscle as needed for anaphylaxis. 2 each 1   glucose blood (TRUE METRIX BLOOD GLUCOSE TEST) test strip Use as instructed 100 each 12   TRUEplus Lancets 28G MISC Use as instructed. Check blood glucose level by fingerstick twice per day. 100 each 3   atorvastatin (LIPITOR) 20 MG tablet TAKE 1 TABLET (20 MG TOTAL) BY MOUTH DAILY. 90 tablet 3   Dulaglutide (TRULICITY) 7.79 TJ/0.3ES SOPN Inject 0.75 mg into the skin once a week. 2 mL 2   Insulin Glargine (BASAGLAR KWIKPEN) 100 UNIT/ML Inject 32 Units into the skin daily. 15 mL 1   INSULIN SYRINGE  .5CC/29G (B-D INSULIN SYRINGE) 29G X 1/2" 0.5 ML MISC Use as instructed. Inject into the skin once daily 100 each 6   Insulin Syringe-Needle U-100 29G X 1/2" 0.3 ML MISC Use to inject Semglee insulin. 100 each 2   metFORMIN (GLUCOPHAGE) 500 MG tablet TAKE 2 TABLETS (1,000 MG TOTAL) BY MOUTH 2 (TWO) TIMES DAILY WITH A MEAL. 270 tablet 1   propranolol ER (INDERAL LA) 80 MG 24 hr capsule TAKE 1 CAPSULE BY MOUTH EVERY DAY 90 capsule 1   ferrous sulfate 325 (65 FE) MG tablet Take 1 tablet by mouth 2 (two) times daily. 180 tablet 1   No facility-administered medications prior to visit.    Allergies  Allergen Reactions   Ace Inhibitors Shortness Of Breath    Reported by the patient  after a trial of lisinopril prescribes by PCP   Aspirin Anaphylaxis   Rocephin [Ceftriaxone] Itching   Ciprofloxacin Itching   Prednisone Palpitations       Objective:    BP (!) 154/95   Pulse 71   Wt 211 lb (95.7 kg)   SpO2 98%   BMI 30.28 kg/m  Wt Readings from Last 3 Encounters:  08/21/21 211 lb (95.7 kg)  05/20/21 210 lb (95.3 kg)  09/03/20 190 lb (86.2 kg)    Physical Exam Vitals and nursing note reviewed.  Constitutional:      Appearance: She is well-developed.  HENT:     Head: Normocephalic and atraumatic.  Cardiovascular:     Rate and Rhythm: Normal rate and regular rhythm.     Heart sounds: Normal heart sounds. No murmur heard.    No friction rub. No gallop.  Pulmonary:     Effort: Pulmonary effort is normal. No tachypnea or respiratory distress.     Breath sounds: Normal breath sounds. No decreased breath sounds, wheezing, rhonchi or rales.  Chest:     Chest wall: No mass or tenderness.  Breasts:    Right: Mass present.    Abdominal:     General: Bowel sounds are normal.     Palpations: Abdomen is soft.  Musculoskeletal:        General: Normal range of motion.     Cervical back: Normal range of motion.  Lymphadenopathy:     Upper Body:     Right upper body: No  supraclavicular, axillary or pectoral adenopathy.     Left upper body: No supraclavicular, axillary or pectoral adenopathy.  Skin:    General: Skin is warm and dry.  Neurological:     Mental Status: She is alert and oriented to person, place, and time.     Coordination: Coordination normal.  Psychiatric:        Attention and Perception: Attention normal.        Mood and Affect: Affect is tearful.        Speech: Speech normal.        Behavior: Behavior normal. Behavior is cooperative.        Thought Content: Thought content normal.        Cognition and Memory: Cognition and memory normal.        Judgment: Judgment normal.          Patient has been counseled extensively about nutrition and exercise as well as the importance of adherence with medications and regular follow-up. The patient was given clear instructions to go to ER or return to medical center if symptoms don't improve, worsen or new problems develop. The patient verbalized understanding.   Follow-up: Return in about 3 months (around 11/21/2021).   Gildardo Pounds, FNP-BC [facility] [place], [place]   08/21/2021, 1:19 PM

## 2021-08-21 NOTE — Patient Instructions (Signed)
[facility]  [phone]

## 2021-08-21 NOTE — Addendum Note (Signed)
Addended by: Demetrius Revel on: 08/21/2021 10:58 AM   Modules accepted: Orders

## 2021-08-22 LAB — CMP14+EGFR
ALT: 21 IU/L (ref 0–32)
AST: 19 IU/L (ref 0–40)
Albumin/Globulin Ratio: 1.4 (ref 1.2–2.2)
Albumin: 4.4 g/dL (ref 3.8–4.8)
Alkaline Phosphatase: 66 IU/L (ref 44–121)
BUN/Creatinine Ratio: 10 (ref 9–23)
BUN: 9 mg/dL (ref 6–24)
Bilirubin Total: <0.2 mg/dL (ref 0.0–1.2)
CO2: 20 mmol/L (ref 20–29)
Calcium: 9.5 mg/dL (ref 8.7–10.2)
Chloride: 105 mmol/L (ref 96–106)
Creatinine, Ser: 0.87 mg/dL (ref 0.57–1.00)
Globulin, Total: 3.1 g/dL (ref 1.5–4.5)
Glucose: 121 mg/dL — ABNORMAL HIGH (ref 70–99)
Potassium: 4.2 mmol/L (ref 3.5–5.2)
Sodium: 138 mmol/L (ref 134–144)
Total Protein: 7.5 g/dL (ref 6.0–8.5)
eGFR: 84 mL/min/{1.73_m2} (ref >59)

## 2021-08-22 LAB — CBC
Hematocrit: 33.1 % — ABNORMAL LOW (ref 34.0–46.6)
Hemoglobin: 10.6 g/dL — ABNORMAL LOW (ref 11.1–15.9)
MCH: 24.4 pg — ABNORMAL LOW (ref 26.6–33.0)
MCHC: 32 g/dL (ref 31.5–35.7)
MCV: 76 fL — ABNORMAL LOW (ref 79–97)
Platelets: 540 10*3/uL — ABNORMAL HIGH (ref 150–450)
RBC: 4.34 x10E6/uL (ref 3.77–5.28)
RDW: 15.2 % (ref 11.7–15.4)
WBC: 7.1 10*3/uL (ref 3.4–10.8)

## 2021-08-24 ENCOUNTER — Ambulatory Visit: Payer: Medicaid Other

## 2021-08-24 ENCOUNTER — Other Ambulatory Visit: Payer: Self-pay | Admitting: Nurse Practitioner

## 2021-08-24 ENCOUNTER — Other Ambulatory Visit: Payer: Self-pay

## 2021-08-24 ENCOUNTER — Encounter: Payer: Self-pay | Admitting: Nurse Practitioner

## 2021-08-24 DIAGNOSIS — I10 Essential (primary) hypertension: Secondary | ICD-10-CM

## 2021-08-24 MED ORDER — PROPRANOLOL HCL ER 120 MG PO CP24
120.0000 mg | ORAL_CAPSULE | Freq: Every day | ORAL | 0 refills | Status: DC
  Filled 2021-08-24 – 2021-09-01 (×2): qty 30, 30d supply, fill #0
  Filled 2021-09-23: qty 30, 30d supply, fill #1
  Filled 2021-10-20: qty 30, 30d supply, fill #2

## 2021-08-25 ENCOUNTER — Other Ambulatory Visit: Payer: Self-pay

## 2021-08-26 ENCOUNTER — Other Ambulatory Visit: Payer: Self-pay | Admitting: Nurse Practitioner

## 2021-08-26 DIAGNOSIS — Z1211 Encounter for screening for malignant neoplasm of colon: Secondary | ICD-10-CM

## 2021-08-27 ENCOUNTER — Encounter: Payer: Self-pay | Admitting: Nurse Practitioner

## 2021-09-01 ENCOUNTER — Other Ambulatory Visit: Payer: Self-pay

## 2021-09-02 ENCOUNTER — Other Ambulatory Visit: Payer: Self-pay

## 2021-09-07 ENCOUNTER — Encounter: Payer: Self-pay | Admitting: Nurse Practitioner

## 2021-09-08 ENCOUNTER — Ambulatory Visit
Admission: RE | Admit: 2021-09-08 | Discharge: 2021-09-08 | Disposition: A | Discharge status: Home / Self Care | Payer: Medicaid Other | Source: Ambulatory Visit | Attending: Obstetrics and Gynecology | Admitting: Obstetrics and Gynecology

## 2021-09-08 ENCOUNTER — Ambulatory Visit
Admission: RE | Admit: 2021-09-08 | Discharge: 2021-09-08 | Disposition: A | Discharge status: Home / Self Care | Payer: No Typology Code available for payment source | Source: Ambulatory Visit | Attending: Obstetrics and Gynecology | Admitting: Obstetrics and Gynecology

## 2021-09-08 ENCOUNTER — Other Ambulatory Visit: Payer: Self-pay

## 2021-09-08 ENCOUNTER — Ambulatory Visit: Payer: Self-pay | Admitting: *Deleted

## 2021-09-08 VITALS — BP 107/102 | Wt 206.0 lb

## 2021-09-08 DIAGNOSIS — N6315 Unspecified lump in the right breast, overlapping quadrants: Secondary | ICD-10-CM

## 2021-09-08 DIAGNOSIS — Z1211 Encounter for screening for malignant neoplasm of colon: Secondary | ICD-10-CM

## 2021-09-08 DIAGNOSIS — N6313 Unspecified lump in the right breast, lower outer quadrant: Secondary | ICD-10-CM

## 2021-09-08 DIAGNOSIS — Z1239 Encounter for other screening for malignant neoplasm of breast: Secondary | ICD-10-CM

## 2021-09-08 DIAGNOSIS — N6331 Unspecified lump in axillary tail of the right breast: Secondary | ICD-10-CM

## 2021-09-08 DIAGNOSIS — R2231 Localized swelling, mass and lump, right upper limb: Secondary | ICD-10-CM

## 2021-09-08 NOTE — Patient Instructions (Signed)
Explained breast self awareness with [name]. Patient did not need a Pap smear today due to last Pap smear was 12/18/2018. Let her know [facility] will cover Pap smears every 3 years unless has a history of abnormal Pap smears. Referred patient to the [facility] for a right breast diagnostic mammogram. Appointment scheduled Tuesday, September 08, 2021 at 1520. Patient aware of appointment and will be there. [name] verbalized understanding.  Rocket Gunderson, Arvil Chaco, RN 2:01 PM

## 2021-09-08 NOTE — Progress Notes (Signed)
[name] is a 46 y.o. female who presents to [facility] clinic today with complaint of right axillary lump x one month.    Pap Smear: Pap smear not completed today. Last Pap smear was 12/18/2018 at [facility] [place] [facility] and was normal. Per patient has no history of an abnormal Pap smear. Last Pap smear result is available in Epic.   Physical exam: Breasts Breasts symmetrical. No skin abnormalities bilateral breasts. No nipple retraction bilateral breasts. No nipple discharge bilateral breasts. No lymphadenopathy. No lumps palpated left breast. Palpated two lumps within the right axilla at 9:30 o'clock 15 cm from the nipple and a bb sized mobile lump at 8 o'clock 3 cm from the nipple. No complaints of pain or tenderness on exam.  MM DIAG BREAST TOMO UNI LEFT  Result Date: 03/30/2021 CLINICAL DATA:  46 year old female presenting for evaluation of new diffuse radiating left breast pain. No associated lump. History of benign left breast biopsy. EXAM: DIGITAL DIAGNOSTIC UNILATERAL LEFT MAMMOGRAM WITH TOMOSYNTHESIS AND CAD TECHNIQUE: Left digital diagnostic mammography and breast tomosynthesis was performed. The images were evaluated with computer-aided detection. COMPARISON:  Previous exam(s). ACR Breast Density Category c: The breast tissue is heterogeneously dense, which may obscure small masses. FINDINGS: Full field tomosynthesis views of the left breast were performed. No suspicious mass, distortion, or microcalcifications are identified to suggest presence of malignancy. Specifically there is no new finding to explain the patient's diffuse radiating pain. IMPRESSION: No mammographic evidence of malignancy in the left breast or other finding to explain the patient's diffuse pain. RECOMMENDATION: 1. Clinical follow-up as needed for the left breast pain. Breast pain is a common condition, which will often resolve on its own without intervention. It can be affected by hormonal  changes, medication side effect, weight changes and fit of the bra. Pain may also be referred from other adjacent areas of the body. Breast pain may be improved by wearing adequate well-fitting support, over-the-counter topical and oral NSAID medication, low-fat diet, and ice/heat as needed. Studies have shown an improvement in cyclic pain with use of evening primrose oil and vitamin E. 2. Return for routine annual screening mammography which will be due in September 2023. I have discussed the findings and recommendations with the patient. If applicable, a reminder letter will be sent to the patient regarding the next appointment. BI-RADS CATEGORY  1: Negative. Electronically Signed   By: Audie Pinto M.D.   On: 03/30/2021 10:35  MM 3D SCREEN BREAST BILATERAL  Result Date: 12/12/2020 CLINICAL DATA:  Screening. EXAM: DIGITAL SCREENING BILATERAL MAMMOGRAM WITH TOMOSYNTHESIS AND CAD TECHNIQUE: Bilateral screening digital craniocaudal and mediolateral oblique mammograms were obtained. Bilateral screening digital breast tomosynthesis was performed. The images were evaluated with computer-aided detection. COMPARISON:  Previous exam(s). ACR Breast Density Category c: The breast tissue is heterogeneously dense, which may obscure small masses. FINDINGS: There are no findings suspicious for malignancy. IMPRESSION: No mammographic evidence of malignancy. A result letter of this screening mammogram will be mailed directly to the patient. RECOMMENDATION: Screening mammogram in one year. (Code:SM-B-01Y) BI-RADS CATEGORY  1: Negative. Electronically Signed   By: Marin Olp M.D.   On: 12/12/2020 16:05   MM 3D SCREEN BREAST BILATERAL  Result Date: 09/21/2019 CLINICAL DATA:  Screening. EXAM: DIGITAL SCREENING BILATERAL MAMMOGRAM WITH TOMO AND CAD COMPARISON:  Previous exam(s). ACR Breast Density Category d: The breast tissue is extremely dense, which lowers the sensitivity of mammography FINDINGS: There are no findings  suspicious for malignancy.  Images were processed with CAD. IMPRESSION: No mammographic evidence of malignancy. A result letter of this screening mammogram will be mailed directly to the patient. RECOMMENDATION: Screening mammogram in one year. (Code:SM-B-01Y) BI-RADS CATEGORY  1: Negative. Electronically Signed   By: Lovey Newcomer M.D.   On: 09/21/2019 16:53   MS DIGITAL SCREENING TOMO BILATERAL  Result Date: 09/20/2018 CLINICAL DATA:  Screening. EXAM: DIGITAL SCREENING BILATERAL MAMMOGRAM WITH TOMO AND CAD COMPARISON:  Previous exam(s). ACR Breast Density Category c: The breast tissue is heterogeneously dense, which may obscure small masses. FINDINGS: There are no findings suspicious for malignancy. Images were processed with CAD. IMPRESSION: No mammographic evidence of malignancy. A result letter of this screening mammogram will be mailed directly to the patient. RECOMMENDATION: Screening mammogram in one year. (Code:SM-B-01Y) BI-RADS CATEGORY  1: Negative. Electronically Signed   By: Kristopher Oppenheim M.D.   On: 09/20/2018 10:44   MM DIGITAL SCREENING BILATERAL  Result Date: 09/10/2016 CLINICAL DATA:  Screening. EXAM: DIGITAL SCREENING BILATERAL MAMMOGRAM WITH CAD COMPARISON:  Previous exam(s). ACR Breast Density Category c: The breast tissue is heterogeneously dense, which may obscure small masses. FINDINGS: There are no findings suspicious for malignancy. Images were processed with CAD. IMPRESSION: No mammographic evidence of malignancy. A result letter of this screening mammogram will be mailed directly to the patient. RECOMMENDATION: Screening mammogram in one year. (Code:SM-B-01Y) BI-RADS CATEGORY  1: Negative. Electronically Signed   By: Fidela Salisbury M.D.   On: 09/10/2016 08:25    Pelvic/Bimanual Pap is not indicated today per BCCCP guidelines.   Smoking History: Patient has never smoked.   Patient Navigation: Patient education provided. Access to services provided for patient through  [place] program.   Colorectal Cancer Screening: Per patient has never had colonoscopy completed. FIT Test given to patient to complete. No complaints today.    Breast and Cervical Cancer Risk Assessment: Patient has family history of a paternal aunt having breast cancer. Patient has no known history of genetic mutations or history of radiation treatment to the chest before age 37. Patient does not have history of cervical dysplasia, immunocompromised, or DES exposure in-utero.  Risk Assessment     Risk Scores       09/08/2021   Last edited by: Royston Bake, CMA   5-year risk: 1 %   Lifetime risk: 9.4 %            A: BCCCP exam without pap smear Complaint of right breast lump.  P: Referred patient to the [place] for a right breast diagnostic mammogram. Appointment scheduled Tuesday, September 08, 2021 at 1520.  Loletta Parish, RN 09/08/2021 2:01 PM

## 2021-09-18 ENCOUNTER — Encounter (HOSPITAL_COMMUNITY): Payer: Self-pay

## 2021-09-18 ENCOUNTER — Other Ambulatory Visit: Payer: Self-pay

## 2021-09-18 ENCOUNTER — Emergency Department (HOSPITAL_COMMUNITY)
Admission: EM | Admit: 2021-09-18 | Discharge: 2021-09-19 | Disposition: A | Discharge status: Home / Self Care | Payer: Self-pay | Attending: Emergency Medicine | Admitting: Emergency Medicine

## 2021-09-18 DIAGNOSIS — R1013 Epigastric pain: Secondary | ICD-10-CM | POA: Insufficient documentation

## 2021-09-18 DIAGNOSIS — R5381 Other malaise: Secondary | ICD-10-CM | POA: Insufficient documentation

## 2021-09-18 DIAGNOSIS — E119 Type 2 diabetes mellitus without complications: Secondary | ICD-10-CM | POA: Insufficient documentation

## 2021-09-18 DIAGNOSIS — I10 Essential (primary) hypertension: Secondary | ICD-10-CM | POA: Insufficient documentation

## 2021-09-18 DIAGNOSIS — R112 Nausea with vomiting, unspecified: Secondary | ICD-10-CM | POA: Insufficient documentation

## 2021-09-18 DIAGNOSIS — R109 Unspecified abdominal pain: Secondary | ICD-10-CM

## 2021-09-18 NOTE — ED Provider Triage Note (Signed)
Emergency Medicine Provider Triage Evaluation Note  Tonya Paul , a 46 y.o. female  was evaluated in triage.  Pt complains of abdominal pain, vomiting.  She has a history of prolonged QT syndrome for which she was taking propranolol.  This dose was recently increased to treat hypertension.  Patient states that today she felt very nauseous.  Earlier tonight she developed pain in her mid abdomen and had episodes of vomiting.  States that her heart was pounding.  Pain improved after vomiting but she still feels nauseous and not her normal self.  Review of Systems  Positive: Abdominal pain, vomiting Negative: Dysuria  Physical Exam  BP (!) 147/100 (BP Location: Right Arm)   Pulse 84   Temp 99 F (37.2 C) (Oral)   Resp 20   Ht '5\' 10"'$  (1.778 m)   Wt 89.4 kg   SpO2 98%   BMI 28.27 kg/m  Gen:   Awake, no distress   Resp:  Normal effort  MSK:   Moves extremities without difficulty  Other:  Abdomen is soft and nontender  Medical Decision Making  Medically screening exam initiated at 11:25 PM.  Appropriate orders placed.  [name] was informed that the remainder of the evaluation will be completed by another provider, this initial triage assessment does not replace that evaluation, and the importance of remaining in the ED until their evaluation is complete.     Carlisle Cater, PA-C 09/18/21 2326

## 2021-09-18 NOTE — ED Triage Notes (Signed)
Patient said she has been having centralized abdominal pain. Vomited. And said she thought her blood pressure was high, checked it and it was 145/105. Headache for the last week.

## 2021-09-19 ENCOUNTER — Emergency Department (HOSPITAL_COMMUNITY): Payer: No Typology Code available for payment source

## 2021-09-19 LAB — URINALYSIS, ROUTINE W REFLEX MICROSCOPIC
Bilirubin Urine: NEGATIVE
Glucose, UA: NEGATIVE mg/dL
Ketones, ur: NEGATIVE mg/dL
Leukocytes,Ua: NEGATIVE
Nitrite: NEGATIVE
Protein, ur: NEGATIVE mg/dL
Specific Gravity, Urine: 1.013 (ref 1.005–1.030)
pH: 6 (ref 5.0–8.0)

## 2021-09-19 LAB — COMPREHENSIVE METABOLIC PANEL
ALT: 20 U/L (ref 0–44)
AST: 17 U/L (ref 15–41)
Albumin: 3.9 g/dL (ref 3.5–5.0)
Alkaline Phosphatase: 48 U/L (ref 38–126)
Anion gap: 8 (ref 5–15)
BUN: 12 mg/dL (ref 6–20)
CO2: 21 mmol/L — ABNORMAL LOW (ref 22–32)
Calcium: 9.6 mg/dL (ref 8.9–10.3)
Chloride: 110 mmol/L (ref 98–111)
Creatinine, Ser: 0.77 mg/dL (ref 0.44–1.00)
GFR, Estimated: >60 mL/min (ref >60)
Glucose, Bld: 121 mg/dL — ABNORMAL HIGH (ref 70–99)
Potassium: 3.7 mmol/L (ref 3.5–5.1)
Sodium: 139 mmol/L (ref 135–145)
Total Bilirubin: 0.6 mg/dL (ref 0.3–1.2)
Total Protein: 7.5 g/dL (ref 6.5–8.1)

## 2021-09-19 LAB — CBC
HCT: 31.5 % — ABNORMAL LOW (ref 36.0–46.0)
Hemoglobin: 9.9 g/dL — ABNORMAL LOW (ref 12.0–15.0)
MCH: 24.7 pg — ABNORMAL LOW (ref 26.0–34.0)
MCHC: 31.4 g/dL (ref 30.0–36.0)
MCV: 78.6 fL — ABNORMAL LOW (ref 80.0–100.0)
Platelets: 501 10*3/uL — ABNORMAL HIGH (ref 150–400)
RBC: 4.01 MIL/uL (ref 3.87–5.11)
RDW: 15.9 % — ABNORMAL HIGH (ref 11.5–15.5)
WBC: 9.6 10*3/uL (ref 4.0–10.5)
nRBC: 0 % (ref 0.0–0.2)

## 2021-09-19 LAB — LIPASE, BLOOD: Lipase: 39 U/L (ref 11–51)

## 2021-09-19 LAB — I-STAT BETA HCG BLOOD, ED (MC, WL, AP ONLY): I-stat hCG, quantitative: <5 m[IU]/mL (ref <5)

## 2021-09-19 MED ORDER — OMEPRAZOLE 20 MG PO CPDR
20.0000 mg | DELAYED_RELEASE_CAPSULE | Freq: Every day | ORAL | 1 refills | Status: DC
  Filled 2021-09-19: qty 30, 30d supply, fill #0

## 2021-09-19 MED ORDER — SUCRALFATE 1 G PO TABS
1.0000 g | ORAL_TABLET | Freq: Four times a day (QID) | ORAL | 0 refills | Status: DC | PRN
Start: 2021-09-19 — End: 2021-12-30
  Filled 2021-09-19: qty 30, 8d supply, fill #0

## 2021-09-19 MED ORDER — ONDANSETRON 4 MG PO TBDP
4.0000 mg | ORAL_TABLET | Freq: Once | ORAL | Status: AC
  Administered 2021-09-19: 4 mg via ORAL
  Filled 2021-09-19: qty 1

## 2021-09-19 MED ORDER — ALUM & MAG HYDROXIDE-SIMETH 200-200-20 MG/5ML PO SUSP
30.0000 mL | Freq: Once | ORAL | Status: AC
  Administered 2021-09-19: 30 mL via ORAL
  Filled 2021-09-19: qty 30

## 2021-09-19 NOTE — ED Provider Notes (Signed)
[facility] Emergency Department Provider Note MRN:  [id number]  Arrival date & time: 09/19/21     Chief Complaint   Abdominal Pain   History of Present Illness   Tonya Paul is a 46 y.o. year-old female with a history of hypertension, diabetes presenting to the ED with chief complaint of abdominal pain.  Epigastric abdominal pain tonight associated with malaise, nausea, vomiting.  No fever, no constipation or diarrhea, no lower abdominal pain, no chest pain or shortness of breath.  Felt like her heart was racing during the pain, saw that her blood pressure was up.  Review of Systems  A thorough review of systems was obtained and all systems are negative except as noted in the HPI and PMH.   Patient's Health History    Past Medical History:  Diagnosis Date   Diabetes mellitus without complication ([place])    Hemorrhoids 11/18/2015   Hypertension    Left breast mass 09/01/2015   Long Q-T syndrome 01/14/2017   Long QT syndrome   Palpitations 02/03/2016   PVCs seen on monitor   Vertigo     Past Surgical History:  Procedure Laterality Date   BREAST BIOPSY Left 09/03/2015   [place] Core bx, benign   ESOPHAGOGASTRODUODENOSCOPY (EGD) WITH PROPOFOL N/A 06/21/2017   Procedure: ESOPHAGOGASTRODUODENOSCOPY (EGD) WITH PROPOFOL;  Surgeon: Lin Landsman, MD;  Location: [facility];  Service: Gastroenterology;  Laterality: N/A;    Family History  Problem Relation Age of Onset   Hypertension Mother    Lung cancer Mother    Throat cancer Father    Diabetes Sister    Lupus Maternal Grandmother    Stroke Maternal Grandfather    Sudden Cardiac Death Daughter    Breast cancer Paternal Aunt     Social History   Socioeconomic History   Marital status: Divorced    Spouse name: Not on file   Number of children: Not on file   Years of education: Not on file   Highest education level: Not on file  Occupational History   Not on file  Tobacco Use   Smoking  status: Never   Smokeless tobacco: Never  Vaping Use   Vaping Use: Never used  Substance and Sexual Activity   Alcohol use: No   Drug use: No   Sexual activity: Yes    Birth control/protection: None  Other Topics Concern   Not on file  Social History Narrative   Not on file   Social Determinants of Health   Financial Resource Strain: Not on file  Food Insecurity: No Food Insecurity (09/08/2021)   Hunger Vital Sign    Worried About Running Out of Food in the Last Year: Never true    Ran Out of Food in the Last Year: Never true  Transportation Needs: No Transportation Needs (09/08/2021)   PRAPARE - Hydrologist (Medical): No    Lack of Transportation (Non-Medical): No  Physical Activity: Not on file  Stress: Not on file  Social Connections: Not on file  Intimate Partner Violence: Not on file     Physical Exam   Vitals:   09/19/21 0300 09/19/21 0315  BP:  (!) 144/100  Pulse: 86 80  Resp: 16 13  Temp:    SpO2: 100% 99%    CONSTITUTIONAL: Well-appearing, NAD NEURO/PSYCH:  Alert and oriented x 3, no focal deficits EYES:  eyes equal and reactive ENT/NECK:  no LAD, no JVD CARDIO: Regular rate, well-perfused, normal S1 and  S2 PULM:  CTAB no wheezing or rhonchi GI/GU:  non-distended, non-tender MSK/SPINE:  No gross deformities, no edema SKIN:  no rash, atraumatic   *Additional and/or pertinent findings included in MDM below  Diagnostic and Interventional Summary    EKG Interpretation  Date/Time:  Saturday September 19 2021 02:01:26 EDT Ventricular Rate:  86 PR Interval:  189 QRS Duration: 92 QT Interval:  382 QTC Calculation: 457 R Axis:   25 Text Interpretation: Sinus rhythm Confirmed by Gerlene Fee [phone]) on 09/19/2021 3:39:42 AM       Labs Reviewed  COMPREHENSIVE METABOLIC PANEL - Abnormal; Notable for the following components:      Result Value   CO2 21 (*)    Glucose, Bld 121 (*)    All other components within normal limits   CBC - Abnormal; Notable for the following components:   Hemoglobin 9.9 (*)    HCT 31.5 (*)    MCV 78.6 (*)    MCH 24.7 (*)    RDW 15.9 (*)    Platelets 501 (*)    All other components within normal limits  URINALYSIS, ROUTINE W REFLEX MICROSCOPIC - Abnormal; Notable for the following components:   Hgb urine dipstick LARGE (*)    Bacteria, UA RARE (*)    All other components within normal limits  LIPASE, BLOOD  I-STAT BETA HCG BLOOD, ED ([facility], [facility], [facility] ONLY)    DG Chest Port 1 View  Final Result      Medications  ondansetron (ZOFRAN-ODT) disintegrating tablet 4 mg (4 mg Oral Given 09/19/21 0201)  alum & mag hydroxide-simeth (MAALOX/MYLANTA) 200-200-20 MG/5ML suspension 30 mL (30 mLs Oral Given 09/19/21 0201)     Procedures  /  Critical Care Procedures  ED Course and Medical Decision Making  Initial Impression and Ddx Normal vital signs, very well-appearing on exam, abdomen soft and nontender, no respiratory distress.  Overall doubt emergent process.  Suspect GERD or esophageal spasm.  PERC negative.  Awaiting screening labs, chest x-ray.  Past medical/surgical history that increases complexity of ED encounter: None  Interpretation of Diagnostics I personally reviewed the EKG and my interpretation is as follows: Sinus rhythm  Labs reassuring with no significant blood count or electrolyte disturbance, normal renal and liver function, normal lipase, chest x-ray normal.  Patient Reassessment and Ultimate Disposition/Management     Appropriate for discharge.  Patient management required discussion with the following services or consulting groups:  None  Complexity of Problems Addressed Acute illness or injury that poses threat of life of bodily function  Additional Data Reviewed and Analyzed Further history obtained from: Further history from spouse/family member  Additional Factors Impacting ED Encounter Risk Prescriptions  Barth Kirks. [place] Small, [place] [email or web address]  Final Clinical Impressions(s) / ED Diagnoses     ICD-10-CM   1. Abdominal pain, unspecified abdominal location  R10.9       ED Discharge Orders          Ordered    omeprazole (PRILOSEC) 20 MG capsule  Daily        09/19/21 0340    sucralfate (CARAFATE) 1 g tablet  4 times daily PRN        09/19/21 0340             Discharge Instructions Discussed with and Provided to Patient:    Discharge Instructions      You were evaluated in the Emergency Department and after careful evaluation, we did not  find any emergent condition requiring admission or further testing in the hospital.  Your exam/testing today is overall reassuring.  Symptoms may be due to spasm of the esophagus due to stomach acid.  Take the omeprazole daily to prevent pain, take the Carafate as needed for immediate relief.  Please return to the Emergency Department if you experience any worsening of your condition.   Thank you for allowing [place] to be a part of your care.      Maudie Flakes, MD 09/19/21 [phone]

## 2021-09-19 NOTE — Discharge Instructions (Signed)
You were evaluated in the Emergency Department and after careful evaluation, we did not find any emergent condition requiring admission or further testing in the hospital.  Your exam/testing today is overall reassuring.  Symptoms may be due to spasm of the esophagus due to stomach acid.  Take the omeprazole daily to prevent pain, take the Carafate as needed for immediate relief.  Please return to the Emergency Department if you experience any worsening of your condition.   Thank you for allowing [place] to be a part of your care.

## 2021-09-21 ENCOUNTER — Other Ambulatory Visit: Payer: Self-pay

## 2021-09-23 ENCOUNTER — Other Ambulatory Visit: Payer: Self-pay

## 2021-09-25 ENCOUNTER — Other Ambulatory Visit: Payer: Self-pay

## 2021-10-20 ENCOUNTER — Other Ambulatory Visit: Payer: Self-pay

## 2021-10-22 ENCOUNTER — Other Ambulatory Visit: Payer: Self-pay

## 2021-11-19 ENCOUNTER — Other Ambulatory Visit: Payer: Self-pay | Admitting: Pharmacist

## 2021-11-19 ENCOUNTER — Other Ambulatory Visit: Payer: Self-pay | Admitting: Nurse Practitioner

## 2021-11-19 ENCOUNTER — Other Ambulatory Visit: Payer: Self-pay

## 2021-11-19 DIAGNOSIS — E1165 Type 2 diabetes mellitus with hyperglycemia: Secondary | ICD-10-CM

## 2021-11-19 DIAGNOSIS — I10 Essential (primary) hypertension: Secondary | ICD-10-CM

## 2021-11-19 MED ORDER — PROPRANOLOL HCL ER 120 MG PO CP24
120.0000 mg | ORAL_CAPSULE | Freq: Every day | ORAL | 0 refills | Status: DC
  Filled 2021-11-19: qty 30, 30d supply, fill #0

## 2021-11-19 MED ORDER — BASAGLAR KWIKPEN 100 UNIT/ML ~~LOC~~ SOPN
32.0000 [IU] | PEN_INJECTOR | Freq: Every day | SUBCUTANEOUS | 2 refills | Status: DC
  Filled 2021-11-19: qty 9, 28d supply, fill #0
  Filled 2022-02-12: qty 9, 28d supply, fill #1
  Filled 2022-03-11 (×2): qty 9, 28d supply, fill #2

## 2021-11-20 ENCOUNTER — Other Ambulatory Visit: Payer: Self-pay

## 2021-11-24 ENCOUNTER — Other Ambulatory Visit: Payer: Self-pay

## 2021-11-25 ENCOUNTER — Ambulatory Visit: Payer: Medicaid Other | Admitting: Nurse Practitioner

## 2021-11-25 ENCOUNTER — Other Ambulatory Visit: Payer: Self-pay | Admitting: Nurse Practitioner

## 2021-11-25 DIAGNOSIS — E1165 Type 2 diabetes mellitus with hyperglycemia: Secondary | ICD-10-CM

## 2021-11-25 NOTE — Telephone Encounter (Signed)
Medication Refill - Medication: Dulaglutide (TRULICITY) 1.5 TN/5.3XY SOPN  Has the patient contacted their pharmacy?  no Pt had appt this morning but had to go into work. Pt rescheduled the first available w/ her pcp is 10//17.  Pt concerned she will be out of trulicity in 3 weeks, and ask if she could get a refill to get her through to this appt. Preferred Pharmacy (with phone number or street name): [facility]  Has the patient been seen for an appointment in the last year OR does the patient have an upcoming appointment? Yes.    Agent: Please be advised that RX refills may take up to 3 business days. We ask that you follow-up with your pharmacy.

## 2021-11-26 ENCOUNTER — Other Ambulatory Visit: Payer: Self-pay

## 2021-11-26 MED ORDER — TRULICITY 1.5 MG/0.5ML ~~LOC~~ SOAJ
1.5000 mg | SUBCUTANEOUS | 3 refills | Status: DC
  Filled 2021-11-26 – 2021-12-17 (×2): qty 2, 28d supply, fill #0

## 2021-11-26 NOTE — Telephone Encounter (Signed)
Requested Prescriptions  Pending Prescriptions Disp Refills  . Dulaglutide (TRULICITY) 1.5 GN/5.6OZ SOPN 2 mL 3    Sig: Inject 1.5 mg into the skin once a week.     Endocrinology:  Diabetes - GLP-1 Receptor Agonists Failed - 11/25/2021 10:36 AM      Failed - HBA1C is between 0 and 7.9 and within 180 days    HbA1c, POC (controlled diabetic range)  Date Value Ref Range Status  08/21/2021 8.1 (A) 0.0 - 7.0 % Final         Passed - Valid encounter within last 6 months    Recent Outpatient Visits          3 months ago Type 2 diabetes mellitus with hyperglycemia, with long-term current use of insulin [facility])   [place], [place] W, NP   6 months ago Type 2 diabetes mellitus with hyperglycemia, with long-term current use of insulin [facility])   [place], [place], [place]   1 year ago Type 2 diabetes mellitus with hyperglycemia, with long-term current use of insulin [facility])   [place], [place] W, NP   1 year ago Insulin dependent type 2 diabetes mellitus [facility])   [place], Jarome [place]atin, RPH-CPP   1 year ago Insulin dependent type 2 diabetes mellitus [facility])   [place], RPH-CPP      Future Appointments            In 1 month Gildardo Pounds, NP [place]

## 2021-11-30 ENCOUNTER — Encounter: Payer: Self-pay | Admitting: Nurse Practitioner

## 2021-12-04 ENCOUNTER — Other Ambulatory Visit: Payer: Self-pay

## 2021-12-04 ENCOUNTER — Encounter (HOSPITAL_BASED_OUTPATIENT_CLINIC_OR_DEPARTMENT_OTHER): Payer: Self-pay | Admitting: *Deleted

## 2021-12-04 ENCOUNTER — Emergency Department (HOSPITAL_BASED_OUTPATIENT_CLINIC_OR_DEPARTMENT_OTHER)
Admission: EM | Admit: 2021-12-04 | Discharge: 2021-12-04 | Disposition: A | Discharge status: Home / Self Care | Payer: No Typology Code available for payment source | Attending: Emergency Medicine | Admitting: Emergency Medicine

## 2021-12-04 ENCOUNTER — Ambulatory Visit: Payer: No Typology Code available for payment source

## 2021-12-04 DIAGNOSIS — I10 Essential (primary) hypertension: Secondary | ICD-10-CM | POA: Insufficient documentation

## 2021-12-04 DIAGNOSIS — B9689 Other specified bacterial agents as the cause of diseases classified elsewhere: Secondary | ICD-10-CM | POA: Insufficient documentation

## 2021-12-04 DIAGNOSIS — E119 Type 2 diabetes mellitus without complications: Secondary | ICD-10-CM | POA: Insufficient documentation

## 2021-12-04 DIAGNOSIS — Z79899 Other long term (current) drug therapy: Secondary | ICD-10-CM | POA: Insufficient documentation

## 2021-12-04 DIAGNOSIS — N390 Urinary tract infection, site not specified: Secondary | ICD-10-CM | POA: Insufficient documentation

## 2021-12-04 DIAGNOSIS — Z794 Long term (current) use of insulin: Secondary | ICD-10-CM | POA: Insufficient documentation

## 2021-12-04 DIAGNOSIS — Z7984 Long term (current) use of oral hypoglycemic drugs: Secondary | ICD-10-CM | POA: Insufficient documentation

## 2021-12-04 DIAGNOSIS — K219 Gastro-esophageal reflux disease without esophagitis: Secondary | ICD-10-CM | POA: Insufficient documentation

## 2021-12-04 LAB — COMPREHENSIVE METABOLIC PANEL
ALT: 15 U/L (ref 0–44)
AST: 14 U/L — ABNORMAL LOW (ref 15–41)
Albumin: 3.8 g/dL (ref 3.5–5.0)
Alkaline Phosphatase: 50 U/L (ref 38–126)
Anion gap: 8 (ref 5–15)
BUN: 9 mg/dL (ref 6–20)
CO2: 22 mmol/L (ref 22–32)
Calcium: 9.2 mg/dL (ref 8.9–10.3)
Chloride: 104 mmol/L (ref 98–111)
Creatinine, Ser: 0.81 mg/dL (ref 0.44–1.00)
GFR, Estimated: >60 mL/min (ref >60)
Glucose, Bld: 126 mg/dL — ABNORMAL HIGH (ref 70–99)
Potassium: 4 mmol/L (ref 3.5–5.1)
Sodium: 134 mmol/L — ABNORMAL LOW (ref 135–145)
Total Bilirubin: 0.5 mg/dL (ref 0.3–1.2)
Total Protein: 7.6 g/dL (ref 6.5–8.1)

## 2021-12-04 LAB — URINALYSIS, ROUTINE W REFLEX MICROSCOPIC
Bilirubin Urine: NEGATIVE
Glucose, UA: NEGATIVE mg/dL
Hgb urine dipstick: NEGATIVE
Ketones, ur: NEGATIVE mg/dL
Nitrite: POSITIVE — AB
Protein, ur: NEGATIVE mg/dL
Specific Gravity, Urine: 1.025 (ref 1.005–1.030)
pH: 6 (ref 5.0–8.0)

## 2021-12-04 LAB — CBC
HCT: 33 % — ABNORMAL LOW (ref 36.0–46.0)
Hemoglobin: 10.6 g/dL — ABNORMAL LOW (ref 12.0–15.0)
MCH: 24.5 pg — ABNORMAL LOW (ref 26.0–34.0)
MCHC: 32.1 g/dL (ref 30.0–36.0)
MCV: 76.2 fL — ABNORMAL LOW (ref 80.0–100.0)
Platelets: 479 10*3/uL — ABNORMAL HIGH (ref 150–400)
RBC: 4.33 MIL/uL (ref 3.87–5.11)
RDW: 16.2 % — ABNORMAL HIGH (ref 11.5–15.5)
WBC: 8.2 10*3/uL (ref 4.0–10.5)
nRBC: 0 % (ref 0.0–0.2)

## 2021-12-04 LAB — URINALYSIS, MICROSCOPIC (REFLEX): RBC / HPF: NONE SEEN RBC/hpf (ref 0–5)

## 2021-12-04 LAB — LIPASE, BLOOD: Lipase: 43 U/L (ref 11–51)

## 2021-12-04 LAB — PREGNANCY, URINE: Preg Test, Ur: NEGATIVE

## 2021-12-04 MED ORDER — MAALOX MAX 400-400-40 MG/5ML PO SUSP: 10.0000 mL | Freq: Four times a day (QID) | ORAL | 0 refills | Status: DC | PRN

## 2021-12-04 MED ORDER — NITROFURANTOIN MONOHYD MACRO 100 MG PO CAPS
100.0000 mg | ORAL_CAPSULE | Freq: Once | ORAL | Status: AC
  Administered 2021-12-04: 100 mg via ORAL
  Filled 2021-12-04: qty 1

## 2021-12-04 MED ORDER — NITROFURANTOIN MONOHYD MACRO 100 MG PO CAPS: 100.0000 mg | ORAL_CAPSULE | Freq: Two times a day (BID) | ORAL | 0 refills | Status: DC

## 2021-12-04 MED ORDER — ONDANSETRON 4 MG PO TBDP
4.0000 mg | ORAL_TABLET | Freq: Once | ORAL | Status: AC
  Administered 2021-12-04: 4 mg via ORAL
  Filled 2021-12-04: qty 1

## 2021-12-04 MED ORDER — ALUM & MAG HYDROXIDE-SIMETH 200-200-20 MG/5ML PO SUSP
30.0000 mL | Freq: Once | ORAL | Status: AC
  Administered 2021-12-04: 30 mL via ORAL
  Filled 2021-12-04: qty 30

## 2021-12-04 NOTE — ED Triage Notes (Signed)
Pt is here for upper abdominal pain with nausea and vomiting x2 days.  LBM was 3 days ago (this is usual) and denies any GU symptoms.

## 2021-12-04 NOTE — Discharge Instructions (Addendum)
You have a urinary tract infection today.  You have been given the first dose of your antibiotics and the rest are at your pharmacy.  Your kidney function is normal, currently I do not have any concern for kidney infection.  There are no other concerning findings in your lab work.  Please follow-up with the gastroenterology office as we discussed.  I sent Maalox to your pharmacy which is safe to take with the Tums.  Also, I have attached some food choices that are good and reflux and peptic ulcer disease.  Return with any worsening symptoms.  It was a pleasure to meet you and we hope you feel better!

## 2021-12-04 NOTE — ED Provider Notes (Signed)
[facility] Provider Note   CSN: [id number] Arrival date & time: 12/04/21  1254     History  Chief Complaint  Patient presents with   Abdominal Pain    Tonya Paul is a 46 y.o. female with a past medical history of type 2 diabetes, GERD, hypertension and long QT syndrome presenting today due to epigastric pain.  She reports its been going on for around 3 days.  Reports that it is constant and worsened by food.  History of hiatal hernia but no other abdominal history.  No urinary or vaginal symptoms.  Also endorsing mild back pain.   Abdominal Pain      Home Medications Prior to Admission medications   Medication Sig Start Date End Date Taking? Authorizing Provider  nitrofurantoin, macrocrystal-monohydrate, (MACROBID) 100 MG capsule Take 1 capsule (100 mg total) by mouth 2 (two) times daily. 12/04/21  Yes Lailah Marcelli A, PA-C  atorvastatin (LIPITOR) 20 MG tablet TAKE 1 TABLET (20 MG TOTAL) BY MOUTH DAILY. 08/21/21 08/21/22  Gildardo Pounds, NP  Blood Glucose Monitoring Suppl (TRUE METRIX METER) w/Device KIT Check blood glucose level by fingerstick twice per day. 10/29/20   Gildardo Pounds, NP  chlorhexidine (PERIDEX) 0.12 % solution Use as directed 15 mLs in the mouth or throat 2 (two) times daily. Patient not taking: Reported on 09/08/2021 11/30/20   Leath-Warren, Alda Lea, NP  cyclobenzaprine (FLEXERIL) 10 MG tablet Take 1 tablet (10 mg total) by mouth 2 (two) times daily as needed for muscle spasms. Patient not taking: Reported on 09/08/2021 05/20/21   Argentina Donovan, PA-C  Dulaglutide (TRULICITY) 1.5 WU/9.8JX SOPN Inject 1.5 mg into the skin once a week. 11/26/21   Gildardo Pounds, NP  EPINEPHrine 0.3 mg/0.3 mL IJ SOAJ injection Inject 0.3 mg into the muscle as needed for anaphylaxis. 05/20/21   Argentina Donovan, PA-C  ferrous sulfate 325 (65 FE) MG tablet Take 1 tablet by mouth 2 (two) times daily. 05/20/21 08/18/21  Argentina Donovan, PA-C  glucose  blood (TRUE METRIX BLOOD GLUCOSE TEST) test strip Use as instructed 10/29/20   Gildardo Pounds, NP  Insulin Glargine [facility]) 100 UNIT/ML Inject 32 Units into the skin daily. 11/19/21   Charlott Rakes, MD  metFORMIN (GLUCOPHAGE) 500 MG tablet TAKE 2 TABLETS (1,000 MG TOTAL) BY MOUTH 2 (TWO) TIMES DAILY WITH A MEAL. 08/21/21 08/21/22  Gildardo Pounds, NP  omeprazole (PRILOSEC) 20 MG capsule Take 1 capsule (20 mg total) by mouth daily. 09/19/21   Maudie Flakes, MD  propranolol ER (INDERAL LA) 120 MG 24 hr capsule Take 1 capsule (120 mg total) by mouth once daily. 11/19/21   Charlott Rakes, MD  sucralfate (CARAFATE) 1 g tablet Take 1 tablet (1 g total) by mouth 4 (four) times daily as needed. 09/19/21   Maudie Flakes, MD  TRUEplus Lancets 28G MISC Use as instructed. Check blood glucose level by fingerstick twice per day. 10/29/20   Gildardo Pounds, NP  lisinopril (ZESTRIL) 5 MG tablet Take 1 tablet (5 mg total) by mouth daily. 01/04/20 03/01/20  Lorretta Harp, MD      Allergies    Ace inhibitors, Aspirin, Rocephin [ceftriaxone], Ciprofloxacin, and Prednisone    Review of Systems   Review of Systems  Gastrointestinal:  Positive for abdominal pain.    Physical Exam Updated Vital Signs BP (!) 123/98   Pulse 80   Temp 98.6 F (37 C) (Oral)   Resp 18  LMP 11/03/2021 (Approximate)   SpO2 100%  Physical Exam Vitals and nursing note reviewed.  Constitutional:      General: She is not in acute distress.    Appearance: Normal appearance. She is not ill-appearing.  HENT:     Head: Normocephalic and atraumatic.  Eyes:     General: No scleral icterus.    Conjunctiva/sclera: Conjunctivae normal.  Cardiovascular:     Rate and Rhythm: Normal rate and regular rhythm.  Pulmonary:     Effort: Pulmonary effort is normal. No respiratory distress.  Abdominal:     General: Abdomen is flat.     Palpations: Abdomen is soft.     Tenderness: There is abdominal tenderness in the epigastric  area. There is no right CVA tenderness or left CVA tenderness.     Hernia: No hernia is present.  Skin:    Findings: No rash.  Neurological:     Mental Status: She is alert.  Psychiatric:        Mood and Affect: Mood normal.     ED Results / Procedures / Treatments   Labs (all labs ordered are listed, but only abnormal results are displayed) Labs Reviewed  URINALYSIS, ROUTINE W REFLEX MICROSCOPIC - Abnormal; Notable for the following components:      Result Value   APPearance CLOUDY (*)    Nitrite POSITIVE (*)    Leukocytes,Ua TRACE (*)    All other components within normal limits  URINALYSIS, MICROSCOPIC (REFLEX) - Abnormal; Notable for the following components:   Bacteria, UA MANY (*)    All other components within normal limits  PREGNANCY, URINE  LIPASE, BLOOD  COMPREHENSIVE METABOLIC PANEL  CBC    EKG None  Radiology No results found.  Procedures Procedures   Medications Ordered in ED Medications  alum & mag hydroxide-simeth (MAALOX/MYLANTA) 200-200-20 MG/5ML suspension 30 mL (30 mLs Oral Given 12/04/21 1602)  nitrofurantoin (macrocrystal-monohydrate) (MACROBID) capsule 100 mg (100 mg Oral Given 12/04/21 1602)    ED Course/ Medical Decision Making/ A&P                           Medical Decision Making Amount and/or Complexity of Data Reviewed Labs: ordered.  Risk OTC drugs. Prescription drug management.   This is a 46 year old female presenting today with epigastric and esophageal burning.  Differential includes but is not limited to hernia, PUD, gastritis, GERD, pancreatitis.   This is not an exhaustive differential.    Past Medical History / Co-morbidities / Social History: Hiatal hernia, hypertension, type 2 diabetes.   Additional history: Per chart review, patient has had multiple EKGs with a prolonged QT.  Today hers is <500.  I do see it noted that she needs to avoid QT prolonging medications per outside provider.  Additionally, patient  presented similarly in July and work-up was negative.  This is when she was prescribed omeprazole and Carafate but did not take them due to her QT history.   Physical Exam: Pertinent physical exam findings include Very mild epigastric tenderness with deep palpation  Lab Tests: I ordered, and personally interpreted labs.  The pertinent results include: UA with UTI Normal lipase and LFTs   Imaging Studies: Considered CT scan however patient was with a largely nontender abdomen.  Epigastric tenderness but I have a low suspicion for pancreatitis.  No CT ordered      Medications: I ordered Maalox.  On reevaluation patient reported feeling better.  She did state that  her discomfort was starting to come back.  She was given a dose of Zofran.      MDM/Disposition: This is a 46 year old female presenting today with epigastric and esophageal burning.  Lab work is benign.  She does have somewhat of a thrombocytosis however I think this is reactive from her UTI.  Physical exam was largely benign so I did not pursue CT imaging.  I have a high suspicion for a GERD flare versus PUD.  Unfortunately, she was originally prescribed omeprazole from an outside provider but she did not take this due to her history of long QT.  I researched other options and Pepcid also causes long QT.  She was treated with Maalox in the department with some success so I will send this to her pharmacy as well.  She was given nitrofurantoin for her UTI as she does have anaphylactic Rocephin and ciprofloxacin allergies.  After patient's work-up today, I feel that she is stable for discharge home.  Normal WBC count, no suspicion for pyelonephritis or other intra-abdominal process causing her symptoms.  She and her significant other are agreeable to discharge.   Final Clinical Impression(s) / ED Diagnoses Final diagnoses:  Lower urinary tract infectious disease  Gastroesophageal reflux disease, unspecified whether esophagitis  present    Rx / DC Orders ED Discharge Orders          Ordered    nitrofurantoin, macrocrystal-monohydrate, (MACROBID) 100 MG capsule  2 times daily        12/04/21 1553           Results and diagnoses were explained to the patient. Return precautions discussed in full. Patient had no additional questions and expressed complete understanding.   This chart was dictated using voice recognition software.  Despite best efforts to proofread,  errors can occur which can change the documentation meaning.    Rhae Hammock, PA-C 12/04/21 Kulm, Wynnedale K, DO 12/05/21 0028

## 2021-12-17 ENCOUNTER — Other Ambulatory Visit: Payer: Self-pay

## 2021-12-20 ENCOUNTER — Encounter (HOSPITAL_COMMUNITY): Payer: Self-pay

## 2021-12-20 ENCOUNTER — Emergency Department (HOSPITAL_COMMUNITY)
Admission: EM | Admit: 2021-12-20 | Discharge: 2021-12-20 | Disposition: A | Discharge status: Home / Self Care | Payer: No Typology Code available for payment source | Attending: Emergency Medicine | Admitting: Emergency Medicine

## 2021-12-20 ENCOUNTER — Other Ambulatory Visit: Payer: Self-pay

## 2021-12-20 DIAGNOSIS — X500XXA Overexertion from strenuous movement or load, initial encounter: Secondary | ICD-10-CM | POA: Diagnosis not present

## 2021-12-20 DIAGNOSIS — Y990 Civilian activity done for income or pay: Secondary | ICD-10-CM | POA: Diagnosis not present

## 2021-12-20 DIAGNOSIS — E119 Type 2 diabetes mellitus without complications: Secondary | ICD-10-CM | POA: Diagnosis not present

## 2021-12-20 DIAGNOSIS — Z794 Long term (current) use of insulin: Secondary | ICD-10-CM | POA: Diagnosis not present

## 2021-12-20 DIAGNOSIS — M549 Dorsalgia, unspecified: Secondary | ICD-10-CM | POA: Diagnosis present

## 2021-12-20 DIAGNOSIS — M62830 Muscle spasm of back: Secondary | ICD-10-CM | POA: Insufficient documentation

## 2021-12-20 DIAGNOSIS — Z79899 Other long term (current) drug therapy: Secondary | ICD-10-CM | POA: Insufficient documentation

## 2021-12-20 DIAGNOSIS — I10 Essential (primary) hypertension: Secondary | ICD-10-CM | POA: Diagnosis not present

## 2021-12-20 DIAGNOSIS — Z7984 Long term (current) use of oral hypoglycemic drugs: Secondary | ICD-10-CM | POA: Insufficient documentation

## 2021-12-20 DIAGNOSIS — T148XXA Other injury of unspecified body region, initial encounter: Secondary | ICD-10-CM

## 2021-12-20 MED ORDER — KETOROLAC TROMETHAMINE 15 MG/ML IJ SOLN
15.0000 mg | Freq: Once | INTRAMUSCULAR | Status: AC
  Administered 2021-12-20: 15 mg via INTRAVENOUS
  Filled 2021-12-20: qty 1

## 2021-12-20 NOTE — ED Notes (Signed)
RN discussed with patient pain education and that getting pain free likely not going to occur but goal is to get it better manageable. She agreed.

## 2021-12-20 NOTE — ED Triage Notes (Signed)
Patient reports back pain. Patient was working at [employer] and [employer]. Patient felt something pop in her lumbar area and sides. 160/90, 80HR, 160CBG, 24G LH started by EMS. Patient did  not receive medication with EMS.

## 2021-12-20 NOTE — Discharge Instructions (Signed)
You may use over-the-counter Acetaminophen (Tylenol), topical muscle creams such as SalonPas, [employer], Bengay, etc. Please stretch, apply ice or heat (whichever helps), and have massage therapy for additional assistance.  For pain control you may take at 1000 mg of Tylenol every 8 hours as needed.

## 2021-12-20 NOTE — ED Provider Notes (Signed)
[facility] DEPT Provider Note  CSN: [id number] Arrival date & time: 12/20/21 0416  Chief Complaint(s) Back Pain  HPI Tonya Paul is a 46 y.o. female with a past medical history listed below who presents to the emergency department.  Sudden onset back pain.  She reports that she was at work, picking up a box when she felt a pop in her mid back and felt immediate pain bilaterally.  Pain with deep aching.  Worse with movement and palpation.  Alleviated by immobility.  Denies any associated chest pain or shortness breath.  No trauma.  No extremity pain or weakness.  The history is provided by the patient.    Past Medical History Past Medical History:  Diagnosis Date   Diabetes mellitus without complication ([place])    Hemorrhoids 11/18/2015   Hypertension    Left breast mass 09/01/2015   Long Q-T syndrome 01/14/2017   Long QT syndrome   Palpitations 02/03/2016   PVCs seen on monitor   Vertigo    Patient Active Problem List   Diagnosis Date Noted   Pain of left patellofemoral joint 09/10/2020   Elevated coronary artery calcium score 01/04/2020   Hyperlipidemia 01/04/2020   Essential hypertension 06/14/2019   Dizziness 06/14/2019   Abdominal pain, chronic, epigastric    GERD (gastroesophageal reflux disease) 06/20/2017   Long Q-T syndrome 01/14/2017   Palpitations 02/03/2016   Uterine fibroid 12/11/2015   Family history of sudden cardiac death Dec 22, 2015   Hemorrhoids 11/18/2015   Left breast mass 09/01/2015   Obesity 09/01/2015   Insulin dependent type 2 diabetes mellitus ([place]) 04/16/2015   Home Medication(s) Prior to Admission medications   Medication Sig Start Date End Date Taking? Authorizing Provider  alum & mag hydroxide-simeth (MAALOX MAX) 400-400-40 MG/5ML suspension Take 10 mLs by mouth every 6 (six) hours as needed for indigestion. 12/04/21   Redwine, Madison A, PA-C  atorvastatin (LIPITOR) 20 MG tablet TAKE 1 TABLET (20 MG TOTAL) BY  MOUTH DAILY. 08/21/21 08/21/22  Gildardo Pounds, NP  Blood Glucose Monitoring Suppl (TRUE METRIX METER) w/Device KIT Check blood glucose level by fingerstick twice per day. 10/29/20   Gildardo Pounds, NP  chlorhexidine (PERIDEX) 0.12 % solution Use as directed 15 mLs in the mouth or throat 2 (two) times daily. Patient not taking: Reported on 09/08/2021 11/30/20   Leath-Warren, Alda Lea, NP  cyclobenzaprine (FLEXERIL) 10 MG tablet Take 1 tablet (10 mg total) by mouth 2 (two) times daily as needed for muscle spasms. Patient not taking: Reported on 09/08/2021 05/20/21   Argentina Donovan, PA-C  Dulaglutide (TRULICITY) 1.5 CB/6.3AG SOPN Inject 1.5 mg into the skin once a week. 11/26/21   Gildardo Pounds, NP  EPINEPHrine 0.3 mg/0.3 mL IJ SOAJ injection Inject 0.3 mg into the muscle as needed for anaphylaxis. 05/20/21   Argentina Donovan, PA-C  ferrous sulfate 325 (65 FE) MG tablet Take 1 tablet by mouth 2 (two) times daily. 05/20/21 08/18/21  Argentina Donovan, PA-C  glucose blood (TRUE METRIX BLOOD GLUCOSE TEST) test strip Use as instructed 10/29/20   Gildardo Pounds, NP  Insulin Glargine [facility]) 100 UNIT/ML Inject 32 Units into the skin daily. 11/19/21   Charlott Rakes, MD  metFORMIN (GLUCOPHAGE) 500 MG tablet TAKE 2 TABLETS (1,000 MG TOTAL) BY MOUTH 2 (TWO) TIMES DAILY WITH A MEAL. 08/21/21 08/21/22  Gildardo Pounds, NP  nitrofurantoin, macrocrystal-monohydrate, (MACROBID) 100 MG capsule Take 1 capsule (100 mg total) by mouth 2 (two) times  daily. 12/04/21   Redwine, Madison A, PA-C  omeprazole (PRILOSEC) 20 MG capsule Take 1 capsule (20 mg total) by mouth daily. 09/19/21   Maudie Flakes, MD  propranolol ER (INDERAL LA) 120 MG 24 hr capsule Take 1 capsule (120 mg total) by mouth once daily. 11/19/21   Charlott Rakes, MD  sucralfate (CARAFATE) 1 g tablet Take 1 tablet (1 g total) by mouth 4 (four) times daily as needed. 09/19/21   Maudie Flakes, MD  TRUEplus Lancets 28G MISC Use as instructed. Check blood  glucose level by fingerstick twice per day. 10/29/20   Gildardo Pounds, NP  lisinopril (ZESTRIL) 5 MG tablet Take 1 tablet (5 mg total) by mouth daily. 01/04/20 03/01/20  Lorretta Harp, MD                                                                                                                                    Allergies Ace inhibitors, Aspirin, Rocephin [ceftriaxone], Ciprofloxacin, and Prednisone  Review of Systems Review of Systems As noted in HPI  Physical Exam Vital Signs  I have reviewed the triage vital signs BP (!) 155/91   Pulse 83   Temp 98.1 F (36.7 C) (Oral)   Resp 18   LMP 11/03/2021 (Approximate)   SpO2 100%   Physical Exam Vitals reviewed.  Constitutional:      General: She is not in acute distress.    Appearance: She is well-developed. She is not diaphoretic.  HENT:     Head: Normocephalic and atraumatic.     Nose: Nose normal.  Eyes:     General: No scleral icterus.       Right eye: No discharge.        Left eye: No discharge.     Conjunctiva/sclera: Conjunctivae normal.     Pupils: Pupils are equal, round, and reactive to light.  Cardiovascular:     Rate and Rhythm: Normal rate and regular rhythm.     Heart sounds: No murmur heard.    No friction rub. No gallop.  Pulmonary:     Effort: Pulmonary effort is normal. No respiratory distress.     Breath sounds: Normal breath sounds. No stridor. No rales.  Abdominal:     General: There is no distension.     Palpations: Abdomen is soft.     Tenderness: There is no abdominal tenderness.  Musculoskeletal:     Cervical back: Normal range of motion and neck supple.     Thoracic back: Spasms and tenderness present. No bony tenderness.       Back:  Skin:    General: Skin is warm and dry.     Findings: No erythema or rash.  Neurological:     Mental Status: She is alert and oriented to person, place, and time.     ED Results and Treatments Labs (all labs ordered are listed, but only abnormal  results  are displayed) Labs Reviewed - No data to display                                                                                                                       EKG  EKG Interpretation  Date/Time:    Ventricular Rate:    PR Interval:    QRS Duration:   QT Interval:    QTC Calculation:   R Axis:     Text Interpretation:         Radiology No results found.  Medications Ordered in ED Medications  ketorolac (TORADOL) 15 MG/ML injection 15 mg (15 mg Intravenous Given 12/20/21 0527)                                                                                                                                     Procedures Procedures  (including critical care time)  Medical Decision Making / ED Course   Medical Decision Making Risk Prescription drug management.    Mid back pain while picking up heavy boxes. History and presentation favor muscle strain/spasm of the mid back musculature. Low suspicion for cardiac etiology, PE, dissection, vertebral fracture. Given toradol, which provide relief.  Patient now able to move more freely.      Final Clinical Impression(s) / ED Diagnoses Final diagnoses:  Muscle strain   The patient appears reasonably screened and/or stabilized for discharge and I doubt any other medical condition or other [facility] requiring further screening, evaluation, or treatment in the ED at this time. I have discussed the findings, Dx and Tx plan with the patient/family who expressed understanding and agree(s) with the plan. Discharge instructions discussed at length. The patient/family was given strict return precautions who verbalized understanding of the instructions. No further questions at time of discharge.  Disposition: Discharge  Condition: Good  ED Discharge Orders     None        Follow Up: Gildardo Pounds, NP [address] [place] [address] [phone]  Call  to schedule an appointment for close  follow up           This chart was dictated using voice recognition software.  Despite best efforts to proofread,  errors can occur which can change the documentation meaning.    Fatima Blank, MD 12/20/21 [phone]

## 2021-12-21 ENCOUNTER — Other Ambulatory Visit: Payer: Self-pay

## 2021-12-30 ENCOUNTER — Other Ambulatory Visit: Payer: Self-pay

## 2021-12-30 ENCOUNTER — Ambulatory Visit
Admission: RE | Admit: 2021-12-30 | Discharge: 2021-12-30 | Disposition: A | Discharge status: Home / Self Care | Payer: No Typology Code available for payment source | Source: Ambulatory Visit | Attending: Emergency Medicine | Admitting: Emergency Medicine

## 2021-12-30 VITALS — BP 127/79 | HR 90 | Temp 98.2°F | Resp 16

## 2021-12-30 DIAGNOSIS — T7840XA Allergy, unspecified, initial encounter: Secondary | ICD-10-CM

## 2021-12-30 DIAGNOSIS — Z87898 Personal history of other specified conditions: Secondary | ICD-10-CM

## 2021-12-30 MED ORDER — METHYLPREDNISOLONE 4 MG PO TBPK
ORAL_TABLET | ORAL | 0 refills | Status: DC
  Filled 2021-12-30: qty 21, 6d supply, fill #0

## 2021-12-30 MED ORDER — MONTELUKAST SODIUM 10 MG PO TABS
10.0000 mg | ORAL_TABLET | Freq: Every day | ORAL | 1 refills | Status: DC
  Filled 2021-12-30: qty 90, 90d supply, fill #0

## 2021-12-30 MED ORDER — TRIAMCINOLONE ACETONIDE 40 MG/ML IJ SUSP
60.0000 mg | Freq: Once | INTRAMUSCULAR | Status: AC
  Administered 2021-12-30: 60 mg via INTRAMUSCULAR

## 2021-12-30 MED ORDER — CETIRIZINE HCL 10 MG PO TABS
10.0000 mg | ORAL_TABLET | Freq: Every day | ORAL | 1 refills | Status: DC
  Filled 2021-12-30: qty 90, 90d supply, fill #0
  Filled 2022-11-10 – 2022-11-18 (×3): qty 90, 90d supply, fill #1

## 2021-12-30 NOTE — ED Triage Notes (Signed)
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 Has been taking Claritin and using cortisone cream.

## 2021-12-30 NOTE — ED Provider Notes (Signed)
[facility]    CSN: [id number] Arrival date & time: 12/30/21  1503    HISTORY   Chief Complaint  Patient presents with   Allergic Reaction    Face swollen with rash and hives - Entered by patient   Rash   HPI Tonya Paul is a pleasant, 46 y.o. female who presents to urgent care today. Patient complains of itching and swelling of face along with the appearance of a mild rash.  Patient states this began 2 days ago after starting a new job at the [employer].  Patient states she has been sweeping the floor history of a good bit of dust.  Patient states she has been using topical cortisone cream on her face along with taking Claritin without meaningful relief of her symptoms.  Patient denies throat swelling, difficulty breathing, difficulty swallowing, wheezing.  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  The history is provided by the patient.   Past Medical History:  Diagnosis Date   Diabetes mellitus without complication ([place])    Hemorrhoids 11/18/2015   Hypertension    Left breast mass 09/01/2015   Long Q-T syndrome 01/14/2017   Long QT syndrome   Palpitations 02/03/2016   PVCs seen on monitor   Vertigo    Patient Active Problem List   Diagnosis Date Noted   Pain of left patellofemoral joint 09/10/2020   Elevated coronary artery calcium score 01/04/2020   Hyperlipidemia 01/04/2020   Essential hypertension 06/14/2019   Dizziness 06/14/2019   Abdominal pain, chronic, epigastric    GERD (gastroesophageal reflux disease) 06/20/2017   Long Q-T syndrome 01/14/2017   Palpitations 02/03/2016   Uterine fibroid 12/11/2015   Family history of sudden cardiac death 30-Dec-2015   Hemorrhoids 11/18/2015   Left breast mass 09/01/2015   Obesity 09/01/2015   Insulin dependent type 2 diabetes mellitus ([place]) 04/16/2015   Past Surgical History:  Procedure Laterality Date   BREAST BIOPSY Left 09/03/2015   [place] Core bx,  benign   ESOPHAGOGASTRODUODENOSCOPY (EGD) WITH PROPOFOL N/A 06/21/2017   Procedure: ESOPHAGOGASTRODUODENOSCOPY (EGD) WITH PROPOFOL;  Surgeon: Lin Landsman, MD;  Location: [place];  Service: Gastroenterology;  Laterality: N/A;   OB History     Gravida  3   Para  2   Term  2   Preterm      AB  1   Living  1      SAB  1   IAB      Ectopic      Multiple      Live Births  2          Home Medications    Prior to Admission medications   Medication Sig Start Date End Date Taking? Authorizing Provider  cetirizine (ZYRTEC ALLERGY) 10 MG tablet Take 1 tablet (10 mg total) by mouth at bedtime. 12/30/21 06/28/22 Yes Lynden Oxford Scales, PA-C  methylPREDNISolone (MEDROL DOSEPAK) 4 MG TBPK tablet Take 24 mg on day 1, 20 mg on day 2, 16 mg on day 3, 12 mg on day 4, 8 mg on day 5, 4 mg on day 6.  Take all tablets in each row at once, do not spread tablets out throughout the day. 12/30/21  Yes Lynden Oxford Scales, PA-C  montelukast (SINGULAIR) 10 MG tablet Take 1 tablet (10 mg total) by mouth at bedtime. 12/30/21 06/28/22 Yes Lynden Oxford Scales, PA-C  alum & mag hydroxide-simeth (MAALOX MAX) 400-400-40 MG/5ML suspension  Take 10 mLs by mouth every 6 (six) hours as needed for indigestion. 12/04/21   Redwine, Madison A, PA-C  atorvastatin (LIPITOR) 20 MG tablet TAKE 1 TABLET (20 MG TOTAL) BY MOUTH DAILY. 08/21/21 08/21/22  Gildardo Pounds, NP  Blood Glucose Monitoring Suppl (TRUE METRIX METER) w/Device KIT Check blood glucose level by fingerstick twice per day. 10/29/20   Gildardo Pounds, NP  Dulaglutide (TRULICITY) 1.5 RF/1.6BW SOPN Inject 1.5 mg into the skin once a week. 11/26/21   Gildardo Pounds, NP  EPINEPHrine 0.3 mg/0.3 mL IJ SOAJ injection Inject 0.3 mg into the muscle as needed for anaphylaxis. 05/20/21   Argentina Donovan, PA-C  ferrous sulfate 325 (65 FE) MG tablet Take 1 tablet by mouth 2 (two) times daily. 05/20/21 08/18/21  Argentina Donovan, PA-C  glucose blood (TRUE  METRIX BLOOD GLUCOSE TEST) test strip Use as instructed 10/29/20   Gildardo Pounds, NP  Insulin Glargine [facility]) 100 UNIT/ML Inject 32 Units into the skin daily. 11/19/21   Charlott Rakes, MD  metFORMIN (GLUCOPHAGE) 500 MG tablet TAKE 2 TABLETS (1,000 MG TOTAL) BY MOUTH 2 (TWO) TIMES DAILY WITH A MEAL. 08/21/21 08/21/22  Gildardo Pounds, NP  omeprazole (PRILOSEC) 20 MG capsule Take 1 capsule (20 mg total) by mouth daily. 09/19/21   Maudie Flakes, MD  propranolol ER (INDERAL LA) 120 MG 24 hr capsule Take 1 capsule (120 mg total) by mouth once daily. 11/19/21   Charlott Rakes, MD  TRUEplus Lancets 28G MISC Use as instructed. Check blood glucose level by fingerstick twice per day. 10/29/20   Gildardo Pounds, NP  lisinopril (ZESTRIL) 5 MG tablet Take 1 tablet (5 mg total) by mouth daily. 01/04/20 03/01/20  Lorretta Harp, MD    Family History Family History  Problem Relation Age of Onset   Hypertension Mother    Lung cancer Mother    Throat cancer Father    Diabetes Sister    Lupus Maternal Grandmother    Stroke Maternal Grandfather    Sudden Cardiac Death Daughter    Breast cancer Paternal Aunt    Social History Social History   Tobacco Use   Smoking status: Never   Smokeless tobacco: Never  Vaping Use   Vaping Use: Never used  Substance Use Topics   Alcohol use: No   Drug use: No   Allergies   Ace inhibitors, Aspirin, Rocephin [ceftriaxone], Ciprofloxacin, and Prednisone  Review of Systems Review of Systems Pertinent findings revealed after performing a 14 point review of systems has been noted in the history of present illness.  Physical Exam Triage Vital Signs ED Triage Vitals  Enc Vitals Group     BP 01/16/21 0827 (!) 147/82     Pulse Rate 01/16/21 0827 72     Resp 01/16/21 0827 18     Temp 01/16/21 0827 98.3 F (36.8 C)     Temp Source 01/16/21 0827 Oral     SpO2 01/16/21 0827 98 %     Weight --      Height --      Head Circumference --      Peak  Flow --      Pain Score 01/16/21 0826 5     Pain Loc --      Pain Edu? --      Excl. in [place]? --   No data found.  Updated Vital Signs BP 127/79 (BP Location: Left Arm)   Pulse 90   Temp  98.2 F (36.8 C) (Oral)   Resp 16   LMP 12/06/2021 (Approximate)   SpO2 98%   Physical Exam Vitals and nursing note reviewed.  Constitutional:      General: She is not in acute distress.    Appearance: Normal appearance. She is not ill-appearing.  HENT:     Head: Normocephalic and atraumatic.     Salivary Glands: Right salivary gland is not diffusely enlarged or tender. Left salivary gland is not diffusely enlarged or tender.     Right Ear: Ear canal and external ear normal. No drainage. A middle ear effusion is present. There is no impacted cerumen. Tympanic membrane is bulging. Tympanic membrane is not injected or erythematous.     Left Ear: Ear canal and external ear normal. No drainage. A middle ear effusion is present. There is no impacted cerumen. Tympanic membrane is bulging. Tympanic membrane is not injected or erythematous.     Ears:     Comments: Bilateral EACs normal, both TMs bulging with clear fluid    Nose: Rhinorrhea present. No nasal deformity, septal deviation, signs of injury, nasal tenderness, mucosal edema or congestion. Rhinorrhea is clear.     Right Nostril: Occlusion present. No foreign body, epistaxis or septal hematoma.     Left Nostril: Occlusion present. No foreign body, epistaxis or septal hematoma.     Right Turbinates: Enlarged, swollen and pale.     Left Turbinates: Enlarged, swollen and pale.     Right Sinus: No maxillary sinus tenderness or frontal sinus tenderness.     Left Sinus: No maxillary sinus tenderness or frontal sinus tenderness.     Mouth/Throat:     Lips: Pink. No lesions.     Mouth: Mucous membranes are moist. No oral lesions.     Pharynx: Oropharynx is clear. Uvula midline. No posterior oropharyngeal erythema or uvula swelling.     Tonsils: No  tonsillar exudate. 0 on the right. 0 on the left.     Comments: Postnasal drip Eyes:     General: Lids are normal.        Right eye: No discharge.        Left eye: No discharge.     Extraocular Movements: Extraocular movements intact.     Conjunctiva/sclera: Conjunctivae normal.     Right eye: Right conjunctiva is not injected.     Left eye: Left conjunctiva is not injected.  Neck:     Trachea: Trachea and phonation normal.  Cardiovascular:     Rate and Rhythm: Normal rate and regular rhythm.     Pulses: Normal pulses.     Heart sounds: Normal heart sounds. No murmur heard.    No friction rub. No gallop.  Pulmonary:     Effort: Pulmonary effort is normal. No accessory muscle usage, prolonged expiration or respiratory distress.     Breath sounds: Normal breath sounds. No stridor, decreased air movement or transmitted upper airway sounds. No decreased breath sounds, wheezing, rhonchi or rales.  Chest:     Chest wall: No tenderness.  Musculoskeletal:        General: Normal range of motion.     Cervical back: Normal range of motion and neck supple. Normal range of motion.  Lymphadenopathy:     Cervical: No cervical adenopathy.  Skin:    General: Skin is warm and dry.     Findings: Rash (Mildly erythematous rash across bridge of nose, both cheeks and forehead) present. No erythema.     Comments: Patient reports  mild swelling of skin on face around eyes and cheeks but because I have never seen her prior to this visit I am unable to confirm presence of swelling  Neurological:     General: No focal deficit present.     Mental Status: She is alert and oriented to person, place, and time.  Psychiatric:        Mood and Affect: Mood normal.        Behavior: Behavior normal.     Visual Acuity Right Eye Distance:   Left Eye Distance:   Bilateral Distance:    Right Eye Near:   Left Eye Near:    Bilateral Near:     UC Couse / Diagnostics / Procedures:     Radiology No results  found.  Procedures Procedures (including critical care time) EKG  Pending results:  Labs Reviewed - No data to display  Medications Ordered in UC: Medications  triamcinolone acetonide (KENALOG-40) injection 60 mg (60 mg Intramuscular Given 12/30/21 1613)    UC Diagnoses / Final Clinical Impressions(s)   I have reviewed the triage vital signs and the nursing notes.  Pertinent labs & imaging results that were available during my care of the patient were reviewed by me and considered in my medical decision making (see chart for details).    Final diagnoses:  History of prolonged Q-T interval on ECG  Acute allergic reaction, initial encounter   Patient advised that I recommend a steroid injection this time.  Patient reports a history of prolonged QT syndrome, EKG today did not reveal prolonged QT so steroid injection was provided.  Patient reports an intolerance of prednisone, states it makes her heart race.  Patient advised to begin methylprednisolone and discontinue if she has the same reaction to it.  Patient also provided with cetirizine and montelukast sent a note to be out of work for several days.  Patient advised to use a respirator or N95 mask while at work.  Patient states she has an appointment with her primary care provider next week to discuss this issue, was unable to be seen today.  Patient strongly encouraged to return to urgent care or go to the emergency room if her symptoms become worse and not better despite this regimen.  ED Prescriptions     Medication Sig Dispense Auth. Provider   cetirizine (ZYRTEC ALLERGY) 10 MG tablet Take 1 tablet (10 mg total) by mouth at bedtime. 90 tablet Lynden Oxford Scales, PA-C   montelukast (SINGULAIR) 10 MG tablet Take 1 tablet (10 mg total) by mouth at bedtime. 90 tablet Lynden Oxford Scales, PA-C   methylPREDNISolone (MEDROL DOSEPAK) 4 MG TBPK tablet Take 24 mg on day 1, 20 mg on day 2, 16 mg on day 3, 12 mg on day 4, 8 mg on day 5,  4 mg on day 6.  Take all tablets in each row at once, do not spread tablets out throughout the day. 21 tablet Lynden Oxford Scales, PA-C      PDMP not reviewed this encounter.  Pending results:  Labs Reviewed - No data to display  Discharge Instructions:   Discharge Instructions      Your EKG today revealed a normal QT interval.  You received an injection of Kenalog today which will significantly reduce the inflammation and swelling caused by your recent allergic reaction.  I sent a prescription for methylprednisolone to your pharmacy, an oral steroid that should keep that inflammation under control.  If you find that this medication is  not tolerable, make your heart race which is makes you feel lousy, please discontinue it.  You are welcome to return for repeat Kenalog injection in the next day or 2 if you are not tolerating methylprednisolone.  I have also sent prescriptions for Zyrtec, a second-generation antihistamine and Singulair, a mast cell stabilizer to your pharmacy.  These 2 medications combined should help prevent you from continuing to have an acute allergic reaction to what ever it is that you are allergic to in the [place].  You may also wish to consider wearing a respirator or an N95 mask while you are at work.  All that being said and despite my best wishes, you may find that you are unable to tolerate working in that environment without further support.  Please follow-up with your primary care provider to discuss further options for treatment.  Thank you for visiting urgent care today.    Disposition Upon Discharge:  Condition: stable for discharge home  Patient presented with an acute illness with associated systemic symptoms and significant discomfort requiring urgent management. In my opinion, this is a condition that a prudent lay person (someone who possesses an average knowledge of health and medicine) may potentially expect to result in complications  if not addressed urgently such as respiratory distress, impairment of bodily function or dysfunction of bodily organs.   Routine symptom specific, illness specific and/or disease specific instructions were discussed with the patient and/or caregiver at length.   As such, the patient has been evaluated and assessed, work-up was performed and treatment was provided in alignment with urgent care protocols and evidence based medicine.  Patient/parent/caregiver has been advised that the patient may require follow up for further testing and treatment if the symptoms continue in spite of treatment, as clinically indicated and appropriate.  Patient/parent/caregiver has been advised to return to the [facility] or PCP if no better; to PCP or the Emergency Department if new signs and symptoms develop, or if the current signs or symptoms continue to change or worsen for further workup, evaluation and treatment as clinically indicated and appropriate  The patient will follow up with their current PCP if and as advised. If the patient does not currently have a PCP we will assist them in obtaining one.   The patient may need specialty follow up if the symptoms continue, in spite of conservative treatment and management, for further workup, evaluation, consultation and treatment as clinically indicated and appropriate.   Patient/parent/caregiver verbalized understanding and agreement of plan as discussed.  All questions were addressed during visit.  Please see discharge instructions below for further details of plan.  This office note has been dictated using Museum/gallery curator.  Unfortunately, this method of dictation can sometimes lead to typographical or grammatical errors.  I apologize for your inconvenience in advance if this occurs.  Please do not hesitate to reach out to me if clarification is needed.      Lynden Oxford Scales, PA-C 12/30/21 1709

## 2021-12-30 NOTE — Discharge Instructions (Addendum)
Your EKG today revealed a normal QT interval.  You received an injection of Kenalog today which will significantly reduce the inflammation and swelling caused by your recent allergic reaction.  I sent a prescription for methylprednisolone to your pharmacy, an oral steroid that should keep that inflammation under control.  If you find that this medication is not tolerable, make your heart race which is makes you feel lousy, please discontinue it.  You are welcome to return for repeat Kenalog injection in the next day or 2 if you are not tolerating methylprednisolone.  I have also sent prescriptions for Zyrtec, a second-generation antihistamine and Singulair, a mast cell stabilizer to your pharmacy.  These 2 medications combined should help prevent you from continuing to have an acute allergic reaction to what ever it is that you are allergic to in the [place].  You may also wish to consider wearing a respirator or an N95 mask while you are at work.  All that being said and despite my best wishes, you may find that you are unable to tolerate working in that environment without further support.  Please follow-up with your primary care provider to discuss further options for treatment.  Thank you for visiting urgent care today.

## 2021-12-31 ENCOUNTER — Other Ambulatory Visit: Payer: Self-pay

## 2022-01-04 ENCOUNTER — Encounter: Payer: Self-pay | Admitting: Nurse Practitioner

## 2022-01-05 ENCOUNTER — Other Ambulatory Visit: Payer: Self-pay

## 2022-01-05 ENCOUNTER — Ambulatory Visit: Payer: Self-pay | Attending: Nurse Practitioner | Admitting: Nurse Practitioner

## 2022-01-05 ENCOUNTER — Other Ambulatory Visit: Payer: Self-pay | Admitting: Pharmacist

## 2022-01-05 ENCOUNTER — Encounter: Payer: Self-pay | Admitting: Nurse Practitioner

## 2022-01-05 VITALS — BP 121/76 | HR 85 | Temp 98.0°F | Ht 70.0 in | Wt 202.8 lb

## 2022-01-05 DIAGNOSIS — Z794 Long term (current) use of insulin: Secondary | ICD-10-CM

## 2022-01-05 DIAGNOSIS — E1165 Type 2 diabetes mellitus with hyperglycemia: Secondary | ICD-10-CM

## 2022-01-05 DIAGNOSIS — I10 Essential (primary) hypertension: Secondary | ICD-10-CM

## 2022-01-05 LAB — POCT GLYCOSYLATED HEMOGLOBIN (HGB A1C): HbA1c, POC (controlled diabetic range): 8.1 % — AB (ref 0.0–7.0)

## 2022-01-05 LAB — GLUCOSE, POCT (MANUAL RESULT ENTRY): POC Glucose: 208 mg/dl — AB (ref 70–99)

## 2022-01-05 MED ORDER — SEMAGLUTIDE (1 MG/DOSE) 4 MG/3ML ~~LOC~~ SOPN
1.0000 mg | PEN_INJECTOR | SUBCUTANEOUS | 3 refills | Status: DC
  Filled 2022-01-05 – 2022-03-24 (×5): qty 3, 28d supply, fill #0
  Filled 2022-04-18 – 2022-04-19 (×2): qty 3, 28d supply, fill #1
  Filled 2022-05-25 (×2): qty 3, 28d supply, fill #2
  Filled 2022-07-01 (×2): qty 3, 28d supply, fill #3

## 2022-01-05 MED ORDER — TRULICITY 1.5 MG/0.5ML ~~LOC~~ SOAJ
1.5000 mg | SUBCUTANEOUS | 1 refills | Status: DC
  Filled 2022-01-05 – 2022-01-26 (×2): qty 2, 28d supply, fill #0
  Filled 2022-02-12: qty 2, 28d supply, fill #1

## 2022-01-05 MED ORDER — TRULICITY 3 MG/0.5ML ~~LOC~~ SOAJ
3.0000 mg | SUBCUTANEOUS | 1 refills | Status: DC
  Filled 2022-01-05: qty 2, 28d supply, fill #0

## 2022-01-05 MED ORDER — PROPRANOLOL HCL ER 120 MG PO CP24
120.0000 mg | ORAL_CAPSULE | Freq: Every day | ORAL | 1 refills | Status: DC
  Filled 2022-01-05: qty 30, 30d supply, fill #0
  Filled 2022-02-04: qty 30, 30d supply, fill #1
  Filled 2022-03-17 (×2): qty 30, 30d supply, fill #2
  Filled 2022-04-18: qty 30, 30d supply, fill #3

## 2022-01-05 NOTE — Progress Notes (Signed)
Assessment & Plan:  [name] was seen today for diabetes.  Diagnoses and all orders for this visit:  Type 2 diabetes mellitus with hyperglycemia, with long-term current use of insulin (HCC) -     POCT glucose (manual entry) -     POCT glycosylated hemoglobin (Hb A1C) -     Microalbumin/Creatinine Ratio, Urine -     Discontinue: Dulaglutide (TRULICITY) 3 KZ/6.0FU SOPN; Inject 3 mg as directed once a week.  Essential hypertension -     propranolol ER (INDERAL LA) 120 MG 24 hr capsule; Take 1 capsule (120 mg total) by mouth daily.    Patient has been counseled on age-appropriate routine health concerns for screening and prevention. These are reviewed and up-to-date. Referrals have been placed accordingly. Immunizations are up-to-date or declined.    Subjective:   Chief Complaint  Patient presents with   Diabetes   HPI Tonya Paul 46 y.o. female presents to office today for follow up to DM.    DM Up from 7.9 to 8.1.  Not well controlled.  We will need to start her on ozempic as trulicity increase in 93m is not currently available.  Once Ozempic is available we will start her on 1 mg weekly.  At this time she will continue on metformin 500 mg twice daily, Basaglar 13 units daily.She has an ACE tolerance and developed cough with lisinopril which has been DC'd LDL not at goal with Atorvastatin 20 mg daily. Weight is gradually increasing Lab Results  Component Value Date   HGBA1C 8.1 (A) 01/05/2022   Lab Results  Component Value Date   LDLCALC 97 05/20/2021     HTN Blood pressures well controlled with propranolol 120 mg daily.   BP Readings from Last 3 Encounters:  01/05/22 121/76  12/30/21 127/79  12/20/21 (!) 155/91    Endorses constipation with taking iron pill.  I recommended Feraheme over-the-counter or blood builder over-the-counter to help offset constipation and treat anemia. Review of Systems  Constitutional:  Negative for fever, malaise/fatigue and weight  loss.  HENT: Negative.  Negative for nosebleeds.   Eyes: Negative.  Negative for blurred vision, double vision and photophobia.  Respiratory: Negative.  Negative for cough and shortness of breath.   Cardiovascular: Negative.  Negative for chest pain, palpitations and leg swelling.  Gastrointestinal: Negative.  Negative for heartburn, nausea and vomiting.  Musculoskeletal: Negative.  Negative for myalgias.  Neurological: Negative.  Negative for dizziness, focal weakness, seizures and headaches.  Psychiatric/Behavioral: Negative.  Negative for suicidal ideas.     Past Medical History:  Diagnosis Date   [place]    Hemorrhoids 11/18/2015   Hypertension    Left breast mass 09/01/2015   Long Q-T syndrome 01/14/2017   Long QT syndrome   Palpitations 02/03/2016   PVCs seen on monitor   Vertigo     Past Surgical History:  Procedure Laterality Date   BREAST BIOPSY Left 09/03/2015   [place] bx, benign   ESOPHAGOGASTRODUODENOSCOPY (EGD) WITH PROPOFOL N/A 06/21/2017   Procedure: ESOPHAGOGASTRODUODENOSCOPY (EGD) WITH PROPOFOL;  Surgeon: VLin Landsman MD;  [facility]  Service: Gastroenterology;  Laterality: N/A;    Family History  Problem Relation Age of Onset   Hypertension Mother    Lung cancer Mother    Throat cancer Father    Diabetes Sister    Lupus Maternal Grandmother    Stroke Maternal Grandfather    Sudden Cardiac Death Daughter    Breast cancer Paternal Aunt  Social History Reviewed with no changes to be made today.   Outpatient Medications Prior to Visit  Medication Sig Dispense Refill   alum & mag hydroxide-simeth (MAALOX MAX) 106-269-48 MG/5ML suspension Take 10 mLs by mouth every 6 (six) hours as needed for indigestion. 355 mL 0   atorvastatin (LIPITOR) 20 MG tablet TAKE 1 TABLET (20 MG TOTAL) BY MOUTH DAILY. 90 tablet 3   Blood Glucose Monitoring Suppl (TRUE METRIX METER) w/Device KIT Check blood glucose level  by fingerstick twice per day. 1 kit 0   cetirizine (ZYRTEC ALLERGY) 10 MG tablet Take 1 tablet (10 mg total) by mouth at bedtime. 90 tablet 1   EPINEPHrine 0.3 mg/0.3 mL IJ SOAJ injection Inject 0.3 mg into the muscle as needed for anaphylaxis. 2 each 1   glucose blood (TRUE METRIX BLOOD GLUCOSE TEST) test strip Use as instructed 100 each 12   Insulin Glargine (BASAGLAR KWIKPEN) 100 UNIT/ML Inject 32 Units into the skin daily. 9 mL 2   metFORMIN (GLUCOPHAGE) 500 MG tablet TAKE 2 TABLETS (1,000 MG TOTAL) BY MOUTH 2 (TWO) TIMES DAILY WITH A MEAL. 270 tablet 1   methylPREDNISolone (MEDROL DOSEPAK) 4 MG TBPK tablet Take 6 tablets on day 1, 5 tablets on day 2, 4 tablets on day 3, 3 tablets on day 4, 2 tablets on day 5, 1 tablet on day 6.  Take all tablets in each row at once, do not spread tablets out throughout the day. 21 tablet 0   montelukast (SINGULAIR) 10 MG tablet Take 1 tablet (10 mg total) by mouth at bedtime. 90 tablet 1   TRUEplus Lancets 28G MISC Use as instructed. Check blood glucose level by fingerstick twice per day. 100 each 3   Dulaglutide (TRULICITY) 1.5 NI/6.2VO SOPN Inject 1.5 mg into the skin once a week. 2 mL 3   propranolol ER (INDERAL LA) 120 MG 24 hr capsule Take 1 capsule (120 mg total) by mouth once daily. 30 capsule 0   ferrous sulfate 325 (65 FE) MG tablet Take 1 tablet by mouth 2 (two) times daily. (Patient not taking: Reported on 01/05/2022) 180 tablet 1   omeprazole (PRILOSEC) 20 MG capsule Take 1 capsule (20 mg total) by mouth daily. (Patient not taking: Reported on 01/05/2022) 30 capsule 1   No facility-administered medications prior to visit.    Allergies  Allergen Reactions   Ace Inhibitors Shortness Of Breath    Reported by the patient after a trial of lisinopril prescribes by PCP   Aspirin Anaphylaxis   Rocephin [Ceftriaxone] Itching   Ciprofloxacin Itching   Prednisone Palpitations       Objective:    BP 121/76   Pulse 85   Temp 98 F (36.7 C)  (Temporal)   Ht _0  (1.778 m)   Wt 202 lb 12.8 oz (92 kg)   LMP 12/06/2021 (Approximate)   SpO2 98%   BMI 29.10 kg/m  Wt Readings from Last 3 Encounters:  01/05/22 202 lb 12.8 oz (92 kg)  09/18/21 197 lb (89.4 kg)  09/08/21 206 lb (93.4 kg)    Physical Exam Vitals and nursing note reviewed.  Constitutional:      Appearance: She is well-developed.  HENT:     Head: Normocephalic and atraumatic.  Cardiovascular:     Rate and Rhythm: Normal rate and regular rhythm.     Heart sounds: Normal heart sounds. No murmur heard.    No friction rub. No gallop.  Pulmonary:     Effort: Pulmonary  effort is normal. No tachypnea or respiratory distress.     Breath sounds: Normal breath sounds. No decreased breath sounds, wheezing, rhonchi or rales.  Chest:     Chest wall: No tenderness.  Abdominal:     General: Bowel sounds are normal.     Palpations: Abdomen is soft.  Musculoskeletal:        General: Normal range of motion.     Cervical back: Normal range of motion.  Skin:    General: Skin is warm and dry.  Neurological:     Mental Status: She is alert and oriented to person, place, and time.     Coordination: Coordination normal.  Psychiatric:        Behavior: Behavior normal. Behavior is cooperative.        Thought Content: Thought content normal.        Judgment: Judgment normal.          Patient has been counseled extensively about nutrition and exercise as well as the importance of adherence with medications and regular follow-up. The patient was given clear instructions to go to ER or return to medical center if symptoms don't improve, worsen or new problems develop. The patient verbalized understanding.   Follow-up: Return in about 3 months (around 04/07/2022) for HTN/HPL/DM.   Gildardo Pounds, FNP-BC [facility], [place]   01/05/2022, 3:21 PM

## 2022-01-06 ENCOUNTER — Ambulatory Visit (HOSPITAL_COMMUNITY): Payer: Self-pay | Admitting: Student

## 2022-01-06 DIAGNOSIS — Z8241 Family history of sudden cardiac death: Secondary | ICD-10-CM

## 2022-01-06 DIAGNOSIS — I4581 Long QT syndrome: Secondary | ICD-10-CM

## 2022-01-06 LAB — MICROALBUMIN / CREATININE URINE RATIO
Creatinine, Urine: 167.3 mg/dL
Microalb/Creat Ratio: 17 mg/g creat (ref 0–29)
Microalbumin, Urine: 28.7 ug/mL

## 2022-01-14 ENCOUNTER — Ambulatory Visit: Payer: Self-pay

## 2022-01-26 ENCOUNTER — Other Ambulatory Visit: Payer: Self-pay

## 2022-01-27 ENCOUNTER — Other Ambulatory Visit: Payer: Self-pay

## 2022-02-01 ENCOUNTER — Other Ambulatory Visit: Payer: Self-pay | Admitting: Hematology and Oncology

## 2022-02-01 DIAGNOSIS — Z124 Encounter for screening for malignant neoplasm of cervix: Secondary | ICD-10-CM

## 2022-02-01 NOTE — Progress Notes (Signed)
Patient: Tonya Paul           Date of Birth: Aug 05, 1975           MRN: [id number] Visit Date: 02/01/2022 PCP: Gildardo Pounds, NP  Cervical Cancer Screening Date of last pap smear: 2-5 yrs ago (12/18/2018 Pap/HPV-Negative) Have you ever had any of the following? Hysterectomy: No Abnormal pap smear: No  Cervical Exam Pap smear completed: Pap test Abnormal Observations: Normal exam Recommendations: Pap smear today; will repeat in 5 years if normal with HPV-      Patient's History Patient Active Problem List   Diagnosis Date Noted   Pain of left patellofemoral joint 09/10/2020   Elevated coronary artery calcium score 01/04/2020   Hyperlipidemia 01/04/2020   Essential hypertension 06/14/2019   Dizziness 06/14/2019   Abdominal pain, chronic, epigastric    GERD (gastroesophageal reflux disease) 06/20/2017   Long Q-T syndrome 01/14/2017   Palpitations 02/03/2016   Uterine fibroid 12/11/2015   Family history of sudden cardiac death Dec 10, 2015   Left breast mass 09/01/2015   Obesity 09/01/2015   Insulin dependent type 2 diabetes mellitus ([place]) 04/16/2015   Past Medical History:  Diagnosis Date   Diabetes mellitus without complication ([place])    Hemorrhoids 11/18/2015   Hypertension    Left breast mass 09/01/2015   Long Q-T syndrome 01/14/2017   Long QT syndrome   Palpitations 02/03/2016   PVCs seen on monitor   Vertigo     Family History  Problem Relation Age of Onset   Hypertension Mother    Lung cancer Mother    Throat cancer Father    Diabetes Sister    Lupus Maternal Grandmother    Stroke Maternal Grandfather    Sudden Cardiac Death Daughter    Breast cancer Paternal Aunt     Social History   Occupational History   Not on file  Tobacco Use   Smoking status: Never   Smokeless tobacco: Never  Vaping Use   Vaping Use: Never used  Substance and Sexual Activity   Alcohol use: No   Drug use: No   Sexual activity: Yes    Birth control/protection: None

## 2022-02-03 ENCOUNTER — Other Ambulatory Visit: Payer: Self-pay

## 2022-02-03 ENCOUNTER — Ambulatory Visit
Admission: RE | Admit: 2022-02-03 | Discharge: 2022-02-03 | Disposition: A | Discharge status: Home / Self Care | Payer: Self-pay | Source: Ambulatory Visit | Attending: Urgent Care | Admitting: Urgent Care

## 2022-02-03 VITALS — BP 126/83 | HR 87 | Temp 99.0°F | Resp 20

## 2022-02-03 DIAGNOSIS — Z794 Long term (current) use of insulin: Secondary | ICD-10-CM | POA: Insufficient documentation

## 2022-02-03 DIAGNOSIS — E119 Type 2 diabetes mellitus without complications: Secondary | ICD-10-CM | POA: Insufficient documentation

## 2022-02-03 DIAGNOSIS — N3001 Acute cystitis with hematuria: Secondary | ICD-10-CM | POA: Insufficient documentation

## 2022-02-03 DIAGNOSIS — N939 Abnormal uterine and vaginal bleeding, unspecified: Secondary | ICD-10-CM | POA: Insufficient documentation

## 2022-02-03 LAB — POCT URINALYSIS DIP (MANUAL ENTRY)
Bilirubin, UA: NEGATIVE
Glucose, UA: NEGATIVE mg/dL
Ketones, POC UA: NEGATIVE mg/dL
Nitrite, UA: POSITIVE — AB
Protein Ur, POC: 30 mg/dL — AB
Spec Grav, UA: >=1.03 — AB (ref 1.010–1.025)
Urobilinogen, UA: 0.2 E.U./dL
pH, UA: 6 (ref 5.0–8.0)

## 2022-02-03 LAB — CYTOLOGY - PAP
Comment: NEGATIVE
Diagnosis: NEGATIVE
High risk HPV: NEGATIVE

## 2022-02-03 MED ORDER — NITROFURANTOIN MONOHYD MACRO 100 MG PO CAPS
100.0000 mg | ORAL_CAPSULE | Freq: Two times a day (BID) | ORAL | 0 refills | Status: DC
  Filled 2022-02-03: qty 14, 7d supply, fill #0

## 2022-02-03 NOTE — ED Provider Notes (Signed)
[facility] - URGENT [facility]ARE [facility]  Note:  This document was prepared using Systems analyst and may include unintentional dictation errors.  MRN: [id number] DOB: 01-25-1976  Subjective:   Tonya Paul is a 46 y.o. female presenting for 2-day history of acute onset vaginal spotting, hematuria. Has 1 female partner, has unprotected sex.  She is not opposed to testing.  Denies fever, pelvic pain, dysuria, abdominal pain, genital rash, vaginal discharge.  No current facility-administered medications for this encounter.  Current Outpatient Medications:    alum & mag hydroxide-simeth (MAALOX MAX) 400-400-40 MG/5ML suspension, Take 10 mLs by mouth every 6 (six) hours as needed for indigestion., Disp: 355 mL, Rfl: 0   atorvastatin (LIPITOR) 20 MG tablet, TAKE 1 TABLET (20 MG TOTAL) BY MOUTH DAILY., Disp: 90 tablet, Rfl: 3   Blood Glucose Monitoring Suppl (TRUE METRIX METER) w/Device KIT, Check blood glucose level by fingerstick twice per day., Disp: 1 kit, Rfl: 0   cetirizine (ZYRTEC ALLERGY) 10 MG tablet, Take 1 tablet (10 mg total) by mouth at bedtime., Disp: 90 tablet, Rfl: 1   Dulaglutide (TRULICITY) 1.5 XK/4.8JE SOPN, Inject 1.5 mg into the skin once a week., Disp: 2 mL, Rfl: 1   EPINEPHrine 0.3 mg/0.3 mL IJ SOAJ injection, Inject 0.3 mg into the muscle as needed for anaphylaxis., Disp: 2 each, Rfl: 1   glucose blood (TRUE METRIX BLOOD GLUCOSE TEST) test strip, Use as instructed, Disp: 100 each, Rfl: 12   Insulin Glargine (BASAGLAR KWIKPEN) 100 UNIT/ML, Inject 32 Units into the skin daily., Disp: 9 mL, Rfl: 2   metFORMIN (GLUCOPHAGE) 500 MG tablet, TAKE 2 TABLETS (1,000 MG TOTAL) BY MOUTH 2 (TWO) TIMES DAILY WITH A MEAL., Disp: 270 tablet, Rfl: 1   methylPREDNISolone (MEDROL DOSEPAK) 4 MG TBPK tablet, Take 6 tablets on day 1, 5 tablets on day 2, 4 tablets on day 3, 3 tablets on day 4, 2 tablets on day 5, 1 tablet on day 6.  Take all tablets in each row at once, do not spread  tablets out throughout the day., Disp: 21 tablet, Rfl: 0   montelukast (SINGULAIR) 10 MG tablet, Take 1 tablet (10 mg total) by mouth at bedtime., Disp: 90 tablet, Rfl: 1   propranolol ER (INDERAL LA) 120 MG 24 hr capsule, Take 1 capsule (120 mg total) by mouth daily., Disp: 90 capsule, Rfl: 1   Semaglutide, 1 MG/DOSE, 4 MG/3ML SOPN, Inject 1 mg as directed once a week., Disp: 3 mL, Rfl: 3   TRUEplus Lancets 28G MISC, Use as instructed. Check blood glucose level by fingerstick twice per day., Disp: 100 each, Rfl: 3   Allergies  Allergen Reactions   Ace Inhibitors Shortness Of Breath    Reported by the patient after a trial of lisinopril prescribes by PCP   Aspirin Anaphylaxis   Rocephin [Ceftriaxone] Itching   Ciprofloxacin Itching   Prednisone Palpitations    Past Medical History:  Diagnosis Date   Diabetes mellitus without complication ([place])    Hemorrhoids 11/18/2015   Hypertension    Left breast mass 09/01/2015   Long Q-T syndrome 01/14/2017   Long QT syndrome   Palpitations 02/03/2016   PVCs seen on monitor   Vertigo      Past Surgical History:  Procedure Laterality Date   BREAST BIOPSY Left 09/03/2015   [place] Core bx, benign   ESOPHAGOGASTRODUODENOSCOPY (EGD) WITH PROPOFOL N/A 06/21/2017   Procedure: ESOPHAGOGASTRODUODENOSCOPY (EGD) WITH PROPOFOL;  Surgeon: Lin Landsman, MD;  Location: [facility]  ENDOSCOPY;  Service: Gastroenterology;  Laterality: N/A;    Family History  Problem Relation Age of Onset   Hypertension Mother    Lung cancer Mother    Throat cancer Father    Diabetes Sister    Lupus Maternal Grandmother    Stroke Maternal Grandfather    Sudden Cardiac Death Daughter    Breast cancer Paternal Aunt     Social History   Tobacco Use   Smoking status: Never   Smokeless tobacco: Never  Vaping Use   Vaping Use: Never used  Substance Use Topics   Alcohol use: No   Drug use: No    ROS   Objective:   Vitals: BP 126/83 (BP Location: Right Arm)    Pulse 87   Temp 99 F (37.2 C) (Oral)   Resp 20   LMP 01/22/2022 (Exact Date)   SpO2 98%   Physical Exam Constitutional:      General: She is not in acute distress.    Appearance: Normal appearance. She is well-developed. She is not ill-appearing, toxic-appearing or diaphoretic.  HENT:     Head: Normocephalic and atraumatic.     Nose: Nose normal.     Mouth/Throat:     Mouth: Mucous membranes are moist.  Eyes:     General: No scleral icterus.       Right eye: No discharge.        Left eye: No discharge.     Extraocular Movements: Extraocular movements intact.     Conjunctiva/sclera: Conjunctivae normal.  Cardiovascular:     Rate and Rhythm: Normal rate.  Pulmonary:     Effort: Pulmonary effort is normal.  Abdominal:     General: Bowel sounds are normal. There is no distension.     Palpations: Abdomen is soft. There is no mass.     Tenderness: There is no abdominal tenderness. There is no right CVA tenderness, left CVA tenderness, guarding or rebound.  Skin:    General: Skin is warm and dry.  Neurological:     General: No focal deficit present.     Mental Status: She is alert and oriented to person, place, and time.  Psychiatric:        Mood and Affect: Mood normal.        Behavior: Behavior normal.        Thought Content: Thought content normal.        Judgment: Judgment normal.     Results for orders placed or performed during the hospital encounter of 02/03/22 (from the past 24 hour(s))  POCT urinalysis dipstick     Status: Abnormal   Collection Time: 02/03/22  3:35 PM  Result Value Ref Range   Color, UA yellow yellow   Clarity, UA clear clear   Glucose, UA negative negative mg/dL   Bilirubin, UA negative negative   Ketones, POC UA negative negative mg/dL   Spec Grav, UA >=1.030 (A) 1.010 - 1.025   Blood, UA large (A) negative   pH, UA 6.0 5.0 - 8.0   Protein Ur, POC =30 (A) negative mg/dL   Urobilinogen, UA 0.2 0.2 or 1.0 E.U./dL   Nitrite, UA Positive (A)  Negative   Leukocytes, UA Small (1+) (A) Negative    Assessment and Plan :   PDMP not reviewed this encounter.  1. Acute cystitis with hematuria   2. Vaginal bleeding   3. Type 2 diabetes mellitus treated with insulin ([place])     Start Macrobid to cover for acute cystitis,  urine culture and STI testing pending.  Recommended aggressive hydration, limiting urinary irritants. Counseled patient on potential for adverse effects with medications prescribed/recommended today, ER and return-to-clinic precautions discussed, patient verbalized understanding.    Jaynee Eagles, [place] 02/03/22 1829

## 2022-02-03 NOTE — Discharge Instructions (Addendum)
Please start Macrobid to address an urinary tract infection. Make sure you hydrate very well with plain water and a quantity of 64 ounces of water a day.  Please limit drinks that are considered urinary irritants such as soda, sweet tea, coffee, energy drinks, alcohol.  These can worsen your urinary and genital symptoms but also be the source of them.  I will let you know about your urine culture results through MyChart to see if we need to prescribe or change your antibiotics based off of those results.

## 2022-02-03 NOTE — ED Triage Notes (Signed)
Pt c/o vaginal spotting with blood clots x today x 2 days-LNMP 11/3-NAD-steady gait

## 2022-02-04 ENCOUNTER — Telehealth: Payer: Self-pay

## 2022-02-04 ENCOUNTER — Other Ambulatory Visit: Payer: Self-pay

## 2022-02-04 NOTE — Telephone Encounter (Signed)
Patient informed negative Pap/HPV results, next pap due in 5 years. Patient verbalized understanding.

## 2022-02-05 ENCOUNTER — Other Ambulatory Visit: Payer: Self-pay

## 2022-02-05 ENCOUNTER — Telehealth (HOSPITAL_COMMUNITY): Payer: Self-pay | Admitting: Emergency Medicine

## 2022-02-05 LAB — CERVICOVAGINAL ANCILLARY ONLY
Bacterial Vaginitis (gardnerella): POSITIVE — AB
Chlamydia: NEGATIVE
Comment: NEGATIVE
Comment: NEGATIVE
Comment: NEGATIVE
Comment: NORMAL
Neisseria Gonorrhea: NEGATIVE
Trichomonas: NEGATIVE

## 2022-02-05 MED ORDER — METRONIDAZOLE 500 MG PO TABS
500.0000 mg | ORAL_TABLET | Freq: Two times a day (BID) | ORAL | 0 refills | Status: DC
  Filled 2022-02-05: qty 14, 7d supply, fill #0

## 2022-02-05 MED ORDER — METRONIDAZOLE 500 MG PO TABS: 500.0000 mg | ORAL_TABLET | Freq: Two times a day (BID) | ORAL | 0 refills | Status: DC

## 2022-02-06 LAB — URINE CULTURE: Culture: >=100000 — AB

## 2022-02-06 IMAGING — DX DG LUMBAR SPINE COMPLETE 4+V
5 series · 5 of 5 positions shown · non-contrast
Comparison: None.

CLINICAL DATA: Low back pain this morning on awakening.

EXAM:
LUMBAR SPINE - COMPLETE 4+ VIEW

[lumbar spine ap]
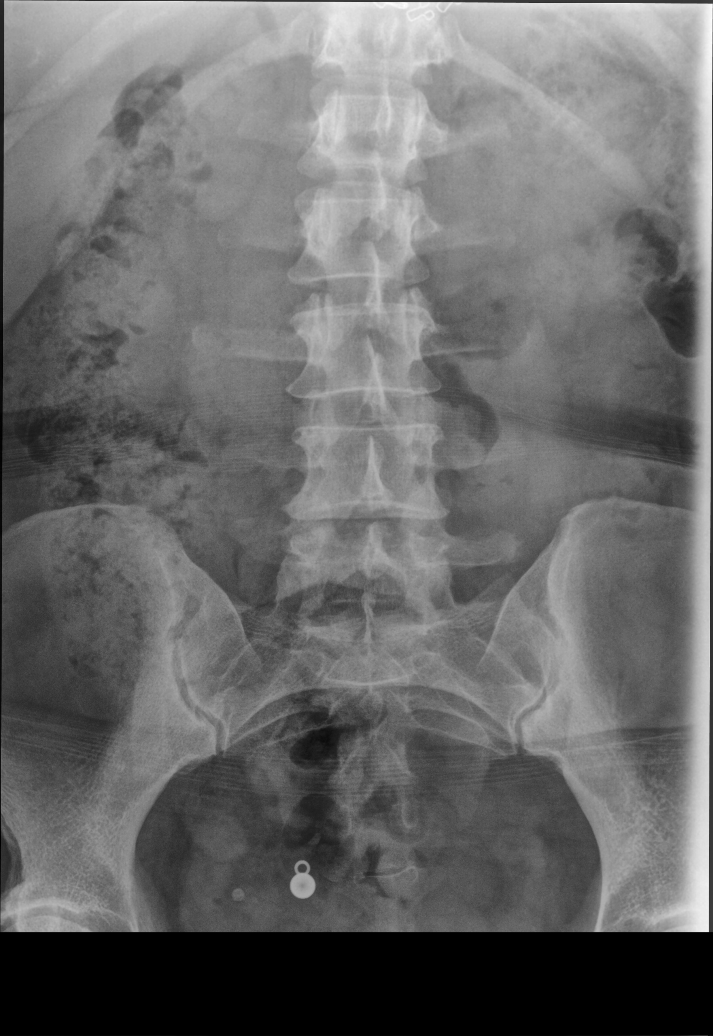

[lumbar spine rpo]
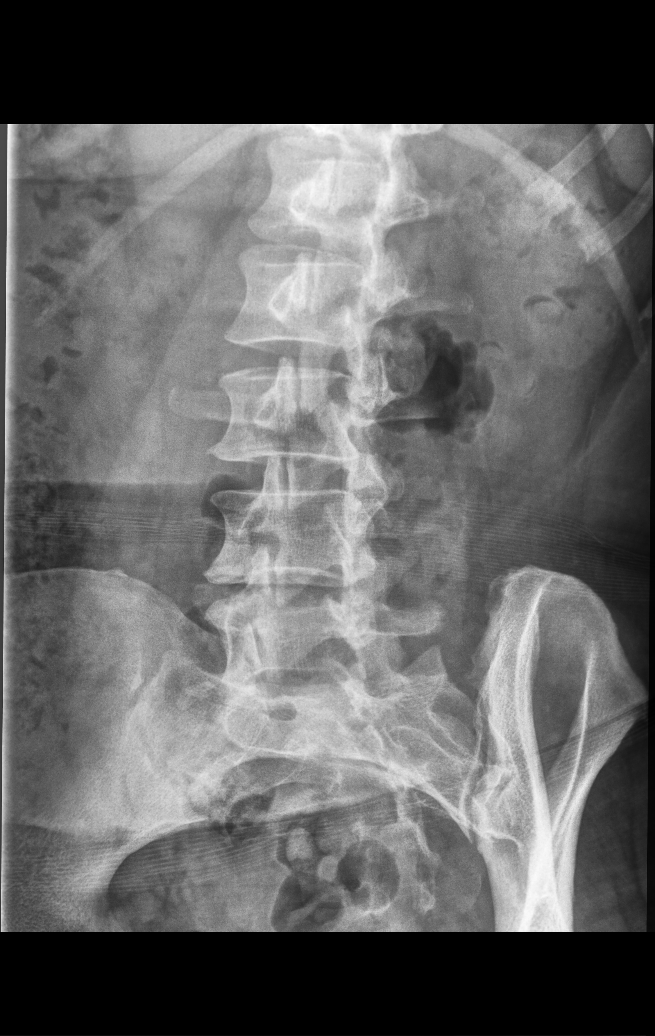

[lumbar spine lpo]
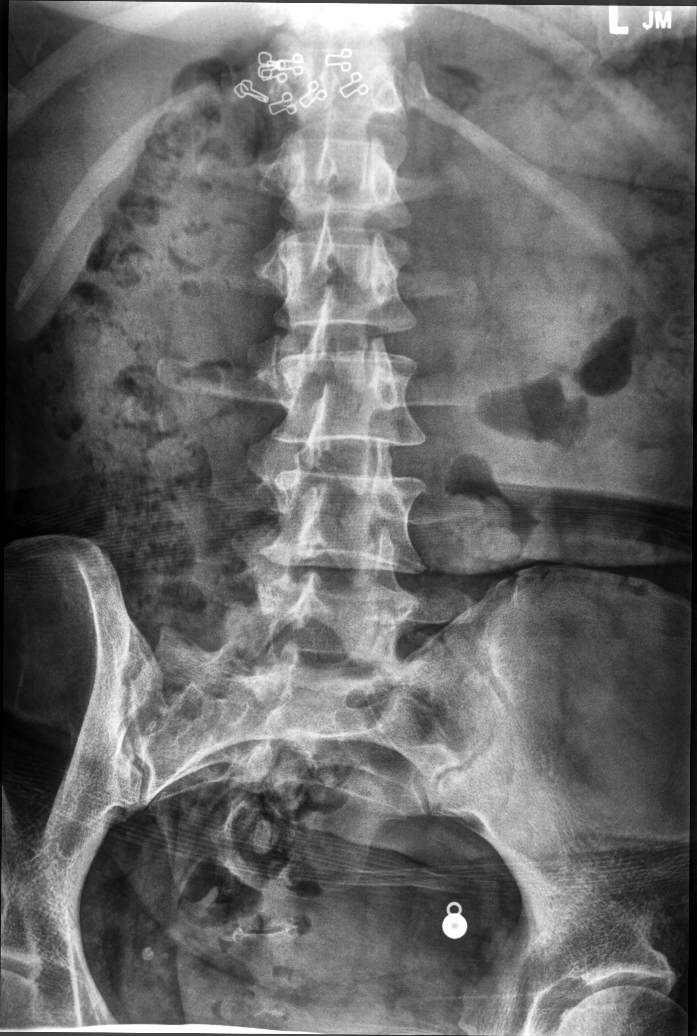

[lumbar spine lat]
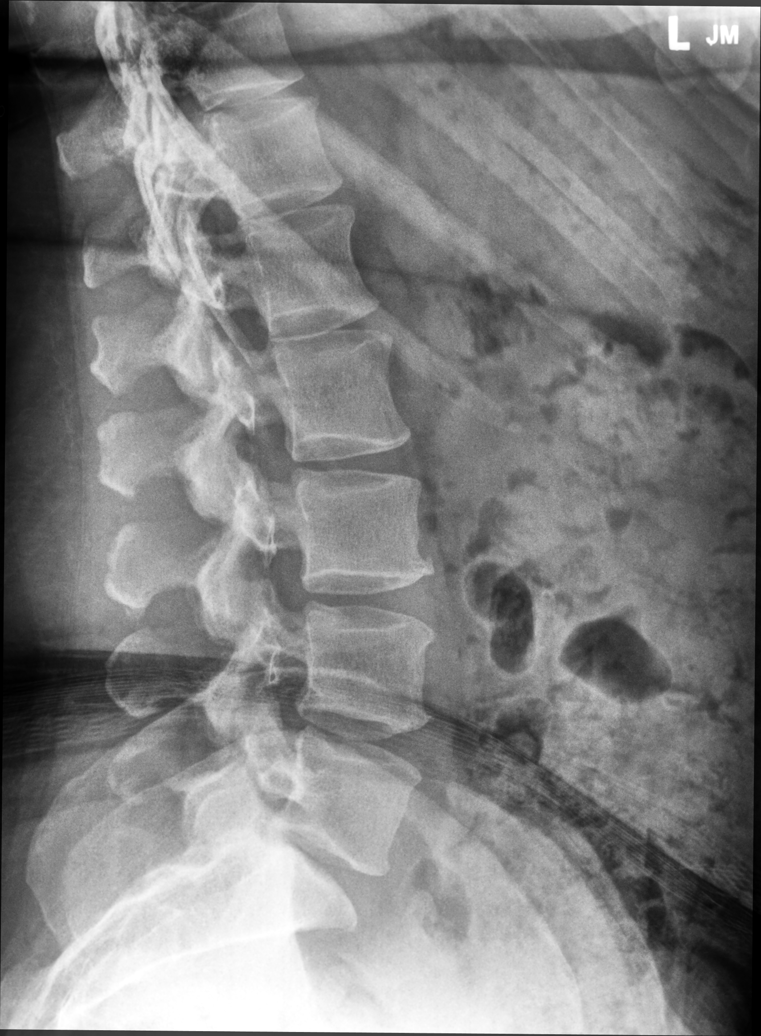

[l5-s1]
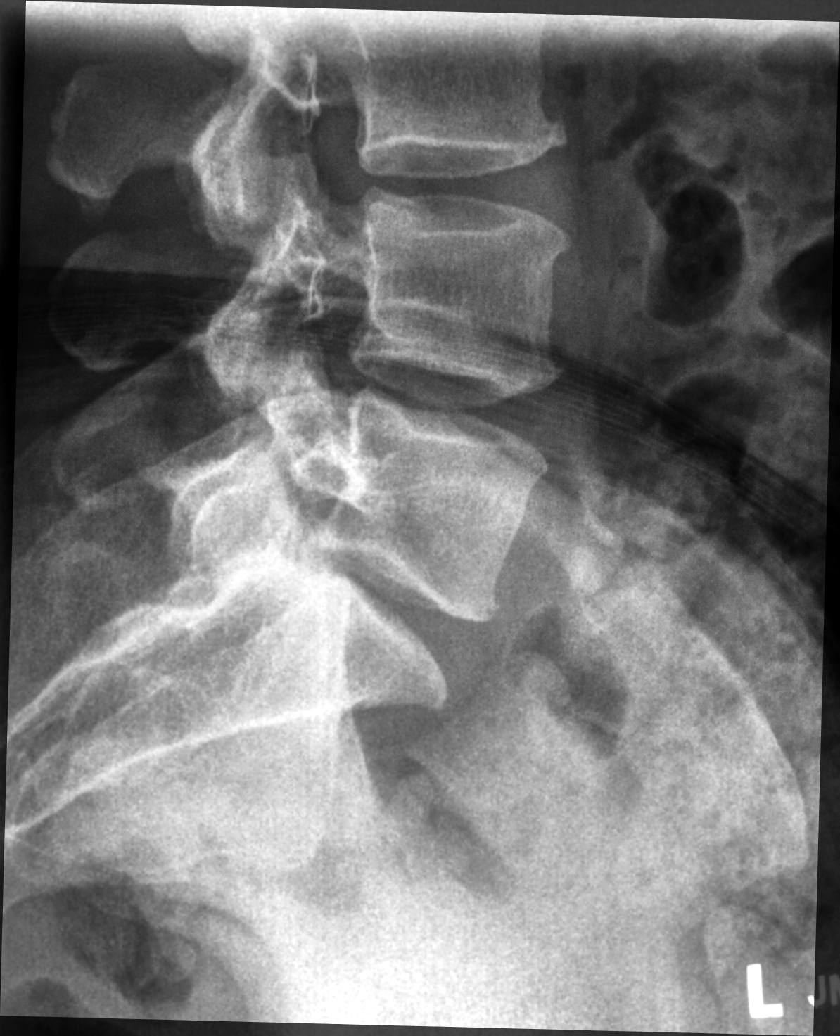

[5 of 5 positions shown; findings below may reference images not displayed]

FINDINGS: There are 5 lumbar type vertebral bodies. The alignment is normal.
The disc spaces are preserved. There is mild intervertebral spurring
and facet hypertrophy. No evidence of acute fracture or pars defect.
Metallic foreign body projecting over the pelvis is presumably
external to the patient, but requires clinical correlation.
IMPRESSION: No acute osseous findings. Mild lumbar spondylosis. Metallic density
projecting over the pelvis, presumably external to the patient.

## 2022-02-08 ENCOUNTER — Telehealth: Payer: Self-pay

## 2022-02-08 MED ORDER — CLINDAMYCIN HCL 300 MG PO CAPS: 300.0000 mg | ORAL_CAPSULE | Freq: Two times a day (BID) | ORAL | 0 refills | Status: DC

## 2022-02-08 NOTE — Telephone Encounter (Signed)
patient calling to state metroNIDAZOLE (FLAGYL) is causing patient to feel like her heart is racing, facial itching and insomnia  Patient is requesting different medication be send to [facility] on [address].

## 2022-02-08 NOTE — Telephone Encounter (Signed)
Patient needs to switch to off of metronidazole she is experiencing side effects including itching, jitteriness, heart racing.  She did well with Macrobid and does not want to be switched off of that.  Recommended switching from metronidazole to clindamycin.  Metronidazole added to her list of medication reactions.

## 2022-02-12 ENCOUNTER — Other Ambulatory Visit: Payer: Self-pay

## 2022-02-13 ENCOUNTER — Emergency Department (HOSPITAL_COMMUNITY)
Admission: EM | Admit: 2022-02-13 | Discharge: 2022-02-13 | Disposition: A | Discharge status: Home / Self Care | Payer: Self-pay | Attending: Emergency Medicine | Admitting: Emergency Medicine

## 2022-02-13 ENCOUNTER — Other Ambulatory Visit (HOSPITAL_COMMUNITY): Payer: Self-pay

## 2022-02-13 ENCOUNTER — Emergency Department (HOSPITAL_COMMUNITY): Payer: Self-pay

## 2022-02-13 DIAGNOSIS — Z79899 Other long term (current) drug therapy: Secondary | ICD-10-CM | POA: Insufficient documentation

## 2022-02-13 DIAGNOSIS — Z794 Long term (current) use of insulin: Secondary | ICD-10-CM | POA: Insufficient documentation

## 2022-02-13 DIAGNOSIS — I10 Essential (primary) hypertension: Secondary | ICD-10-CM | POA: Insufficient documentation

## 2022-02-13 DIAGNOSIS — Z7984 Long term (current) use of oral hypoglycemic drugs: Secondary | ICD-10-CM | POA: Insufficient documentation

## 2022-02-13 DIAGNOSIS — E119 Type 2 diabetes mellitus without complications: Secondary | ICD-10-CM | POA: Insufficient documentation

## 2022-02-13 DIAGNOSIS — N939 Abnormal uterine and vaginal bleeding, unspecified: Secondary | ICD-10-CM | POA: Insufficient documentation

## 2022-02-13 LAB — COMPREHENSIVE METABOLIC PANEL
ALT: 17 U/L (ref 0–44)
AST: 21 U/L (ref 15–41)
Albumin: 3.3 g/dL — ABNORMAL LOW (ref 3.5–5.0)
Alkaline Phosphatase: 57 U/L (ref 38–126)
Anion gap: 11 (ref 5–15)
BUN: 8 mg/dL (ref 6–20)
CO2: 21 mmol/L — ABNORMAL LOW (ref 22–32)
Calcium: 9 mg/dL (ref 8.9–10.3)
Chloride: 104 mmol/L (ref 98–111)
Creatinine, Ser: 0.93 mg/dL (ref 0.44–1.00)
GFR, Estimated: >60 mL/min (ref >60)
Glucose, Bld: 168 mg/dL — ABNORMAL HIGH (ref 70–99)
Potassium: 3.7 mmol/L (ref 3.5–5.1)
Sodium: 136 mmol/L (ref 135–145)
Total Bilirubin: 0.4 mg/dL (ref 0.3–1.2)
Total Protein: 7 g/dL (ref 6.5–8.1)

## 2022-02-13 LAB — I-STAT BETA HCG BLOOD, ED (MC, WL, AP ONLY): I-stat hCG, quantitative: <5 m[IU]/mL (ref <5)

## 2022-02-13 LAB — CBC
HCT: 31.5 % — ABNORMAL LOW (ref 36.0–46.0)
Hemoglobin: 9.6 g/dL — ABNORMAL LOW (ref 12.0–15.0)
MCH: 23.8 pg — ABNORMAL LOW (ref 26.0–34.0)
MCHC: 30.5 g/dL (ref 30.0–36.0)
MCV: 78.2 fL — ABNORMAL LOW (ref 80.0–100.0)
Platelets: 464 10*3/uL — ABNORMAL HIGH (ref 150–400)
RBC: 4.03 MIL/uL (ref 3.87–5.11)
RDW: 15.8 % — ABNORMAL HIGH (ref 11.5–15.5)
WBC: 7.6 10*3/uL (ref 4.0–10.5)
nRBC: 0 % (ref 0.0–0.2)

## 2022-02-13 LAB — TYPE AND SCREEN
ABO/RH(D): O POS
Antibody Screen: NEGATIVE

## 2022-02-13 NOTE — ED Notes (Signed)
Pt states she the first day of her normal menstrual was 01-21-22. Pt states it only lasted 5 days, but normally lasts 7 days. Pt states the flow of blood was normal for her. Pt states three days after her menstrual stopped she started spotting. Pt states as the days progressed the bleeding got heavier and has lasted for 2 weeks now. Pt states when she applies pressure to her abdomen the bleeding is heavier. Pt denies soaking more than one pad an hour. Pt states she started having bad cramps in pelvic area last night. Pt states felt nauseated this morning and vomited.

## 2022-02-13 NOTE — ED Provider Notes (Cosign Needed Addendum)
[facility] EMERGENCY DEPARTMENT Provider Note   CSN: [id number] Arrival date & time: 02/13/22  0900     History HTN, DM Chief Complaint  Patient presents with   Vaginal Bleeding    Tonya Paul is a 46 y.o. female.  46 y.o female with a PMH of DM, HTN presents to the ED with a chief complaint of vaginal bleeding for the past 2 weeks.  Patient reports some vaginal spotting over the last couple of days, this has been waxing and waning in quantity.  She is changing 1 pad a day.  She does report the bleeding has somewhat improved over the last couple of days but she is now having some lower abdominal cramping.  Her last menstrual cycle was earlier this month and lasted approximately 5 days which is normal for patient.  She does have a prior history of two stillborn pregnancies, but no prior history of ectopics.  Was recently evaluated by her gynecologist for her annual exam with a cervical cancer screening, also reports she was seen at urgent care about a week ago and tested for STIs, provided with a prescription for antibiotics, she was also treated for a UTI and has completed this regimen.  She endorse some occasional dizziness at times.  She has taken some Tylenol for improvement in her lower abdominal cramping.  No fevers, no nausea, no vomiting, no syncope.  The history is provided by the patient and medical records.  Vaginal Bleeding Quality:  Bright red Severity:  Mild Onset quality:  Gradual Duration:  2 weeks Timing:  Intermittent Associated symptoms: abdominal pain   Associated symptoms: no fever and no nausea        Home Medications Prior to Admission medications   Medication Sig Start Date End Date Taking? Authorizing Provider  alum & mag hydroxide-simeth (MAALOX MAX) 400-400-40 MG/5ML suspension Take 10 mLs by mouth every 6 (six) hours as needed for indigestion. 12/04/21   Redwine, Madison A, PA-C  atorvastatin (LIPITOR) 20 MG tablet TAKE 1 TABLET (20  MG TOTAL) BY MOUTH DAILY. 08/21/21 08/21/22  Gildardo Pounds, NP  Blood Glucose Monitoring Suppl (TRUE METRIX METER) w/Device KIT Check blood glucose level by fingerstick twice per day. 10/29/20   Gildardo Pounds, NP  cetirizine (ZYRTEC ALLERGY) 10 MG tablet Take 1 tablet (10 mg total) by mouth at bedtime. 12/30/21 06/28/22  Lynden Oxford Scales, PA-C  clindamycin (CLEOCIN) 300 MG capsule Take 1 capsule (300 mg total) by mouth 2 (two) times daily. 02/08/22   Jaynee Eagles, PA-C  Dulaglutide (TRULICITY) 1.5 GE/9.5MW SOPN Inject 1.5 mg into the skin once a week. 01/05/22 03/24/22  Charlott Rakes, MD  EPINEPHrine 0.3 mg/0.3 mL IJ SOAJ injection Inject 0.3 mg into the muscle as needed for anaphylaxis. 05/20/21   Argentina Donovan, PA-C  glucose blood (TRUE METRIX BLOOD GLUCOSE TEST) test strip Use as instructed 10/29/20   Gildardo Pounds, NP  Insulin Glargine [employer]) 100 UNIT/ML Inject 32 Units into the skin daily. 11/19/21   Charlott Rakes, MD  metFORMIN (GLUCOPHAGE) 500 MG tablet TAKE 2 TABLETS (1,000 MG TOTAL) BY MOUTH 2 (TWO) TIMES DAILY WITH A MEAL. 08/21/21 08/21/22  Gildardo Pounds, NP  methylPREDNISolone (MEDROL DOSEPAK) 4 MG TBPK tablet Take 6 tablets on day 1, 5 tablets on day 2, 4 tablets on day 3, 3 tablets on day 4, 2 tablets on day 5, 1 tablet on day 6.  Take all tablets in each row at once, do not  spread tablets out throughout the day. 12/30/21   Lynden Oxford Scales, PA-C  montelukast (SINGULAIR) 10 MG tablet Take 1 tablet (10 mg total) by mouth at bedtime. 12/30/21 06/28/22  Lynden Oxford Scales, PA-C  nitrofurantoin, macrocrystal-monohydrate, (MACROBID) 100 MG capsule Take 1 capsule (100 mg total) by mouth 2 (two) times daily. 02/03/22   Jaynee Eagles, PA-C  propranolol ER (INDERAL LA) 120 MG 24 hr capsule Take 1 capsule (120 mg total) by mouth daily. 01/05/22   Gildardo Pounds, NP  Semaglutide, 1 MG/DOSE, 4 MG/3ML SOPN Inject 1 mg as directed once a week. 01/05/22   Charlott Rakes, MD   TRUEplus Lancets 28G MISC Use as instructed. Check blood glucose level by fingerstick twice per day. 10/29/20   Gildardo Pounds, NP  lisinopril (ZESTRIL) 5 MG tablet Take 1 tablet (5 mg total) by mouth daily. 01/04/20 03/01/20  Lorretta Harp, MD      Allergies    Ace inhibitors, Aspirin, Rocephin [ceftriaxone], Metronidazole, Ciprofloxacin, and Prednisone    Review of Systems   Review of Systems  Constitutional:  Negative for chills and fever.  HENT:  Negative for sore throat.   Respiratory:  Negative for shortness of breath.   Cardiovascular:  Negative for chest pain.  Gastrointestinal:  Positive for abdominal pain. Negative for blood in stool, constipation, diarrhea, nausea and vomiting.  Genitourinary:  Positive for vaginal bleeding. Negative for flank pain.  All other systems reviewed and are negative.   Physical Exam Updated Vital Signs BP (!) 141/90   Pulse 83   Temp 97.8 F (36.6 C) (Oral)   Resp 15   LMP 01/22/2022 (Exact Date)   SpO2 99%  Physical Exam Vitals and nursing note reviewed.  Constitutional:      General: She is not in acute distress.    Appearance: She is well-developed.  HENT:     Head: Normocephalic and atraumatic.     Mouth/Throat:     Pharynx: No oropharyngeal exudate.     Comments: Conjunctiva not pale Eyes:     Pupils: Pupils are equal, round, and reactive to light.  Cardiovascular:     Rate and Rhythm: Regular rhythm.     Heart sounds: Normal heart sounds.  Pulmonary:     Effort: Pulmonary effort is normal. No respiratory distress.     Breath sounds: Normal breath sounds.  Abdominal:     General: Bowel sounds are normal. There is no distension.     Palpations: Abdomen is soft.     Tenderness: There is no abdominal tenderness.     Comments: Abdomen is soft, nontender to palpation without any guarding or rebound.  Musculoskeletal:        General: No tenderness or deformity.     Cervical back: Normal range of motion.     Right lower  leg: No edema.     Left lower leg: No edema.  Skin:    General: Skin is warm and dry.  Neurological:     Mental Status: She is alert and oriented to person, place, and time.     ED Results / Procedures / Treatments   Labs (all labs ordered are listed, but only abnormal results are displayed) Labs Reviewed  COMPREHENSIVE METABOLIC PANEL - Abnormal; Notable for the following components:      Result Value   CO2 21 (*)    Glucose, Bld 168 (*)    Albumin 3.3 (*)    All other components within normal limits  CBC -  Abnormal; Notable for the following components:   Hemoglobin 9.6 (*)    HCT 31.5 (*)    MCV 78.2 (*)    MCH 23.8 (*)    RDW 15.8 (*)    Platelets 464 (*)    All other components within normal limits  I-STAT BETA HCG BLOOD, ED ([facility], [facility], [facility] ONLY)  TYPE AND SCREEN    EKG None  Radiology US Pelvis Complete  Result Date: 02/13/2022 CLINICAL DATA:  Vaginal bleeding. EXAM: TRANSABDOMINAL AND TRANSVAGINAL ULTRASOUND OF PELVIS DOPPLER ULTRASOUND OF OVARIES TECHNIQUE: Both transabdominal and transvaginal ultrasound examinations of the pelvis were performed. Transabdominal technique was performed for global imaging of the pelvis including uterus, ovaries, adnexal regions, and pelvic cul-de-sac. It was necessary to proceed with endovaginal exam following the transabdominal exam to visualize the endometrium and ovaries. Color and duplex Doppler ultrasound was utilized to evaluate blood flow to the ovaries. COMPARISON:  December 09, 2015. FINDINGS: Uterus Measurements: 7.3 x 6.6 x 6.0 cm = volume: 147 mL. At least 3 uterine fibroids are noted, the largest measuring 2.7 cm in the left side of the uterus. Endometrium Thickness: 8 mm which is within normal limits. No focal abnormality visualized. Right ovary Measurements: 3.0 x 2.1 x 1.5 cm = volume: 5 mL. Normal appearance/no adnexal mass. Left ovary Measurements: 4.4 x 4.0 x 2.7 cm = volume: 24 mL. 3.6 cm simple cyst is noted. Pulsed  Doppler evaluation of both ovaries demonstrates normal low-resistance arterial and venous waveforms. Other findings No abnormal free fluid. IMPRESSION: No definite evidence of ovarian torsion. Three uterine fibroids are noted, the largest measuring 2.7 cm. 3.6 cm left ovarian cyst. No follow up imaging recommended. Note: This recommendation does not apply to premenarchal patients or to those with increased risk (genetic, family history, elevated tumor markers or other high-risk factors) of ovarian cancer. Reference: Radiology 2019 Nov; 293(2):359-371. Electronically Signed   By: Marijo Conception M.D.   On: 02/13/2022 14:13   US Transvaginal Non-OB  Result Date: 02/13/2022 CLINICAL DATA:  Vaginal bleeding. EXAM: TRANSABDOMINAL AND TRANSVAGINAL ULTRASOUND OF PELVIS DOPPLER ULTRASOUND OF OVARIES TECHNIQUE: Both transabdominal and transvaginal ultrasound examinations of the pelvis were performed. Transabdominal technique was performed for global imaging of the pelvis including uterus, ovaries, adnexal regions, and pelvic cul-de-sac. It was necessary to proceed with endovaginal exam following the transabdominal exam to visualize the endometrium and ovaries. Color and duplex Doppler ultrasound was utilized to evaluate blood flow to the ovaries. COMPARISON:  December 09, 2015. FINDINGS: Uterus Measurements: 7.3 x 6.6 x 6.0 cm = volume: 147 mL. At least 3 uterine fibroids are noted, the largest measuring 2.7 cm in the left side of the uterus. Endometrium Thickness: 8 mm which is within normal limits. No focal abnormality visualized. Right ovary Measurements: 3.0 x 2.1 x 1.5 cm = volume: 5 mL. Normal appearance/no adnexal mass. Left ovary Measurements: 4.4 x 4.0 x 2.7 cm = volume: 24 mL. 3.6 cm simple cyst is noted. Pulsed Doppler evaluation of both ovaries demonstrates normal low-resistance arterial and venous waveforms. Other findings No abnormal free fluid. IMPRESSION: No definite evidence of ovarian torsion. Three  uterine fibroids are noted, the largest measuring 2.7 cm. 3.6 cm left ovarian cyst. No follow up imaging recommended. Note: This recommendation does not apply to premenarchal patients or to those with increased risk (genetic, family history, elevated tumor markers or other high-risk factors) of ovarian cancer. Reference: Radiology 2019 Nov; 293(2):359-371. Electronically Signed   By: Bobbe Medico.D.  On: 02/13/2022 14:13   [place] Art/Ven Flow Abd Pelv Doppler  Result Date: 02/13/2022 CLINICAL DATA:  Vaginal bleeding. EXAM: TRANSABDOMINAL AND TRANSVAGINAL ULTRASOUND OF PELVIS DOPPLER ULTRASOUND OF OVARIES TECHNIQUE: Both transabdominal and transvaginal ultrasound examinations of the pelvis were performed. Transabdominal technique was performed for global imaging of the pelvis including uterus, ovaries, adnexal regions, and pelvic cul-de-sac. It was necessary to proceed with endovaginal exam following the transabdominal exam to visualize the endometrium and ovaries. Color and duplex Doppler ultrasound was utilized to evaluate blood flow to the ovaries. COMPARISON:  December 09, 2015. FINDINGS: Uterus Measurements: 7.3 x 6.6 x 6.0 cm = volume: 147 mL. At least 3 uterine fibroids are noted, the largest measuring 2.7 cm in the left side of the uterus. Endometrium Thickness: 8 mm which is within normal limits. No focal abnormality visualized. Right ovary Measurements: 3.0 x 2.1 x 1.5 cm = volume: 5 mL. Normal appearance/no adnexal mass. Left ovary Measurements: 4.4 x 4.0 x 2.7 cm = volume: 24 mL. 3.6 cm simple cyst is noted. Pulsed Doppler evaluation of both ovaries demonstrates normal low-resistance arterial and venous waveforms. Other findings No abnormal free fluid. IMPRESSION: No definite evidence of ovarian torsion. Three uterine fibroids are noted, the largest measuring 2.7 cm. 3.6 cm left ovarian cyst. No follow up imaging recommended. Note: This recommendation does not apply to premenarchal patients or to  those with increased risk (genetic, family history, elevated tumor markers or other high-risk factors) of ovarian cancer. Reference: Radiology 2019 Nov; 293(2):359-371. Electronically Signed   By: Marijo Conception M.D.   On: 02/13/2022 14:13    Procedures Procedures    Medications Ordered in ED Medications - No data to display  ED Course/ Medical Decision Making/ A&P                           Medical Decision Making Amount and/or Complexity of Data Reviewed Labs: ordered. Radiology: ordered.    Patient here with underlying vaginal bleeding for the past 2 weeks, reports wearing a pad after her menstrual cycle was approximately at the beginning of the month.  She states some spotting changing 1 pad a day.  Does endorse occasional dizziness however this a prior history of vertigo.  She did have a gynecologist appointment recently where she had her annual screening without any acute findings.  Secondarily she was seen at urgent care for some urinary symptoms and diagnosed with a UTI, he did complete a course of antibiotics she is here without any CVA tenderness, no fever, no nausea or vomiting.  She does endorse pain along the lower abdominal area, has taken some Tylenol with improvement in symptoms.  She also was treated for BV and completed a round of antibiotics.  She did report her STD screening was negative for any other infection.  During evaluation she is overall well-appearing, blood pressure is somewhat elevated.  CMP without any electrolyte derangement.  Her creatinine levels within normal limits.  Her CBC is unremarkable with globin at 9.6, consistent with her prior baseline.  Beta hCG is negative, no suspicion for ectopic at this point.  Her abdomen is soft, mildly tender to palpation without any guarding or rebound.  She does have a prior history of fibroids however states that these have never been her problems in the past.  She does not have any prior history of bleeding disorder.  I  discussed with patient results of her labs, we did discuss appropriate  follow-up with gynecology.  We did also discuss ultrasound imaging at this point however without any concerns for miscarriage, ectopic, I do not feel that acute ultrasound is warranted at this time.  She is agreeable to following up with gynecology as she is an established patient.  She appears hemodynamically stable for discharge at this time.  Patient discharged from the emergency department in stable condition.  11:19 AM Patient is requesting ultrasound, as she reports her bleeding "spotting" for 2 weeks is not normal. I do not feel the need for acute imaging at this time but will proceed with patients request.  Ultrasound pelvis: IMPRESSION:  No definite evidence of ovarian torsion.    Three uterine fibroids are noted, the largest measuring 2.7 cm.    3.6 cm left ovarian cyst. No follow up imaging recommended. Note:  This recommendation does not apply to premenarchal patients or to  those with increased risk (genetic, family history, elevated tumor  markers or other high-risk factors) of ovarian cancer. Reference:  Radiology 2019 Nov; 293(2):359-371.   These results are discussed at length with patient.  She is informed to follow-up with gynecology at this time. We did discuss the need to follow-up for fibroid see if they increase in size, she is hemodynamically stable with no signs of acute on chronic anemia.  Patient is stable for discharge.   Portions of this note were generated with [employer]. Dictation errors may occur despite best attempts at proofreading.   Final Clinical Impression(s) / ED Diagnoses Final diagnoses:  Vaginal bleeding    Rx / DC Orders ED Discharge Orders     None         Janeece Fitting, PA-C 02/13/22 1107    Janeece Fitting, PA-C 02/13/22 1436    Charlesetta Shanks, MD 02/14/22 [phone]

## 2022-02-13 NOTE — Discharge Instructions (Addendum)
Your laboratories also are within normal limits today.  Please schedule an appointment with your gynecologist in order to further evaluate your abnormal uterine bleeding.  You were provided with results of your ultrasound on today's visit, please follow-up with your gynecologist for further management of your fibroids.

## 2022-02-13 NOTE — ED Notes (Signed)
Patient verbalizes understanding of discharge instructions. Opportunity for questioning and answers were provided. Pt discharged from ED. 

## 2022-02-13 NOTE — ED Triage Notes (Signed)
Patient here with complaint of vaginal bleeding that started one week ago. Patient reports needing to change a pad once per day. Also complains of lower abdominal cramping. Patient is alert, oriented, ambulating independently with steady gait, and is in no apparent distress at this time.

## 2022-02-13 NOTE — ED Notes (Signed)
Patient transported to Ultrasound 

## 2022-02-15 ENCOUNTER — Encounter: Payer: Self-pay | Admitting: Nurse Practitioner

## 2022-02-15 ENCOUNTER — Other Ambulatory Visit: Payer: Self-pay

## 2022-02-15 ENCOUNTER — Other Ambulatory Visit: Payer: Self-pay | Admitting: Nurse Practitioner

## 2022-02-15 ENCOUNTER — Ambulatory Visit: Payer: Self-pay

## 2022-02-15 DIAGNOSIS — N939 Abnormal uterine and vaginal bleeding, unspecified: Secondary | ICD-10-CM

## 2022-02-15 MED ORDER — IBUPROFEN 600 MG PO TABS
600.0000 mg | ORAL_TABLET | Freq: Three times a day (TID) | ORAL | 1 refills | Status: DC | PRN
  Filled 2022-02-15 – 2022-02-16 (×2): qty 60, 20d supply, fill #0
  Filled 2022-11-10 – 2022-11-18 (×3): qty 60, 20d supply, fill #1

## 2022-02-15 NOTE — Telephone Encounter (Signed)
  Chief Complaint: vaginal bleeding  Symptoms: vaginal bleeding changing 2 pads every hr, cramping 10/10 Frequency: yesterday but sx been going on for 2 weeks or more  Pertinent Negatives: NA Disposition: '[]'$ ED /'[]'$ Urgent Care (no appt availability in office) / '[x]'$ Appointment(In office/virtual)/ '[]'$  [facility]/ '[]'$ Home Care/ '[]'$ Refused Recommended Disposition /'[]'$ [facility]/ '[]'$  Follow-up with PCP Additional Notes: pt has been to UC for UTI and then d/t bleeding and pain went to ED on 02/13/22. Pt states she was dx with fibroids and to FU with PCP. Scheduled appt for tomorrow at [phone] with PCP and advised pt care advice and if sx get worse throughout today can go back to ED. Pt verbalized understanding.   Reason for Disposition  MODERATE vaginal bleeding (e.g., soaking 1 pad or tampon per hour and present > 6 hours; 1 menstrual cup every 6 hours)  Answer Assessment - Initial Assessment Questions 1. AMOUNT: "Describe the bleeding that you are having."    - SPOTTING: spotting, or pinkish / brownish mucous discharge; does not fill panty liner or pad    - MILD:  less than 1 pad / hour; less than patient's usual menstrual bleeding   - MODERATE: 1-2 pads / hour; 1 menstrual cup every 6 hours; small-medium blood clots (e.g., pea, grape, small coin)   - SEVERE: soaking 2 or more pads/hour for 2 or more hours; 1 menstrual cup every 2 hours; bleeding not contained by pads or continuous red blood from vagina; large blood clots (e.g., golf ball, large coin)      2 pads every hr  2. ONSET: "When did the bleeding begin?" "Is it continuing now?"     Yesterday but been bleeding since 2 weeks or more  3. MENSTRUAL PERIOD: "When was the last normal menstrual period?" "How is this different than your period?"     01/22/22,  5. ABDOMEN PAIN: "Do you have any pain?" "How bad is the pain?"  (e.g., Scale 1-10; mild, moderate, or severe)   - MILD (1-3): doesn't interfere with normal activities,  abdomen soft and not tender to touch    - MODERATE (4-7): interferes with normal activities or awakens from sleep, abdomen tender to touch    - SEVERE (8-10): excruciating pain, doubled over, unable to do any normal activities      10 8. HORMONE MEDICINES: "Are you taking any hormone medicines, prescription or over-the-counter?" (e.g., birth control pills, estrogen)     no 10. CAUSE: "What do you think is causing the bleeding?" (e.g., recent gyn surgery, recent gyn procedure; known bleeding disorder, cervical cancer, polycystic ovarian disease, fibroids)         Fibroids  12. OTHER SYMPTOMS: "What other symptoms are you having with the bleeding?" (e.g., passed tissue, vaginal discharge, fever, menstrual-type cramps)  Protocols used: Vaginal Bleeding - Abnormal-A-AH

## 2022-02-16 ENCOUNTER — Other Ambulatory Visit: Payer: Self-pay

## 2022-02-16 ENCOUNTER — Ambulatory Visit: Payer: Self-pay | Admitting: Nurse Practitioner

## 2022-02-19 ENCOUNTER — Other Ambulatory Visit: Payer: Self-pay

## 2022-02-23 ENCOUNTER — Other Ambulatory Visit: Payer: Self-pay

## 2022-02-26 ENCOUNTER — Other Ambulatory Visit: Payer: Self-pay

## 2022-03-02 ENCOUNTER — Other Ambulatory Visit: Payer: Self-pay

## 2022-03-03 ENCOUNTER — Other Ambulatory Visit: Payer: Self-pay | Admitting: Nurse Practitioner

## 2022-03-03 ENCOUNTER — Other Ambulatory Visit: Payer: Self-pay

## 2022-03-03 MED ORDER — NORETHINDRONE ACETATE 5 MG PO TABS
10.0000 mg | ORAL_TABLET | Freq: Every day | ORAL | 0 refills | Status: DC
  Filled 2022-03-03: qty 20, 10d supply, fill #0

## 2022-03-04 ENCOUNTER — Encounter: Payer: Self-pay | Admitting: Family Medicine

## 2022-03-04 ENCOUNTER — Other Ambulatory Visit: Payer: Self-pay

## 2022-03-04 ENCOUNTER — Other Ambulatory Visit (HOSPITAL_COMMUNITY)
Admission: RE | Admit: 2022-03-04 | Discharge: 2022-03-04 | Disposition: A | Discharge status: Home / Self Care | Payer: Self-pay | Source: Ambulatory Visit | Attending: Family Medicine | Admitting: Family Medicine

## 2022-03-04 ENCOUNTER — Ambulatory Visit (INDEPENDENT_AMBULATORY_CARE_PROVIDER_SITE_OTHER): Payer: Self-pay | Admitting: Family Medicine

## 2022-03-04 VITALS — BP 141/92 | HR 89 | Wt 213.2 lb

## 2022-03-04 DIAGNOSIS — N939 Abnormal uterine and vaginal bleeding, unspecified: Secondary | ICD-10-CM

## 2022-03-04 NOTE — Assessment & Plan Note (Signed)
Discussed options briefly. Will begin her Aygestin for now. Wants to avoid surgery if possible. Discussed ruling out endometrial pathology as first line. Alternatives might include IUD, ablation, hyst.

## 2022-03-04 NOTE — Progress Notes (Signed)
   Subjective:    Patient ID: Tonya Paul is a 46 y.o. female presenting with Abnormal Uterine Bleeding  on 03/04/2022  HPI: Has had some worsening bleeding and has known fibroids. Has recently bled x 37 days.  Her mom had very large fibroids. Reports strong fear of surgery. H/o SVD x 2. Seen in ED for same and fibr slightly larger (largest is 2.7 cm, not submucosal. Hgb 9.6. small simple 3.6 cm left ovarian cyst, that does not need f/u noted.  Review of Systems  Constitutional:  Negative for chills and fever.  Respiratory:  Negative for shortness of breath.   Cardiovascular:  Negative for chest pain.  Gastrointestinal:  Negative for abdominal pain, nausea and vomiting.  Genitourinary:  Negative for dysuria.  Skin:  Negative for rash.      Objective:    BP (!) 141/92   Pulse 89   Wt 213 lb 3.2 oz (96.7 kg)   BMI 30.59 kg/m  Physical Exam Exam conducted with a chaperone present.  Constitutional:      General: She is not in acute distress.    Appearance: She is well-developed.  HENT:     Head: Normocephalic and atraumatic.  Eyes:     General: No scleral icterus. Cardiovascular:     Rate and Rhythm: Normal rate.  Pulmonary:     Effort: Pulmonary effort is normal.  Abdominal:     Palpations: Abdomen is soft.  Musculoskeletal:     Cervical back: Neck supple.  Skin:    General: Skin is warm and dry.  Neurological:     Mental Status: She is alert and oriented to person, place, and time.    Procedure: Patient given informed consent, signed copy in the chart, time out was performed. Appropriate time out taken. . The patient was placed in the lithotomy position and the cervix brought into view with sterile speculum.  Portio of cervix cleansed x 2 with betadine swabs.  A tenaculum was placed in the anterior lip of the cervix.  The uterus was sounded for depth of 10 cm. A pipelle was introduced to into the uterus, suction created,  and an endometrial sample was obtained. All  equipment was removed and accounted for.  The patient tolerated the procedure well.      Assessment & Plan:   Problem List Items Addressed This Visit       Unprioritized   Abnormal uterine bleeding - Primary    Discussed options briefly. Will begin her Aygestin for now. Wants to avoid surgery if possible. Discussed ruling out endometrial pathology as first line. Alternatives might include IUD, ablation, hyst.      Relevant Orders   Surgical pathology( [facility])    Return in about 6 weeks (around 04/15/2022) for a follow-up.  Donnamae Jude, MD 03/04/2022 10:56 AM

## 2022-03-04 NOTE — Progress Notes (Signed)
2 cycles in November, the one in beginning on the month lasted 7 days, the last of the month was 9 days. In between these times its still bleeding but light, still having to wear a pad. Has been having clots during this. Has now been bleeding for 37 days, experiencing cramps which she has never had issues with before. Was diagnosed at [facility] with 1 ovarian cysts, and 3 uterine fibroids.   Was prescribed Aygestin yesterday by her PCP. Has not started that yet.

## 2022-03-09 ENCOUNTER — Other Ambulatory Visit: Payer: Self-pay

## 2022-03-09 DIAGNOSIS — Z1231 Encounter for screening mammogram for malignant neoplasm of breast: Secondary | ICD-10-CM

## 2022-03-10 ENCOUNTER — Encounter: Payer: Self-pay | Admitting: Family Medicine

## 2022-03-10 LAB — SURGICAL PATHOLOGY

## 2022-03-11 ENCOUNTER — Other Ambulatory Visit: Payer: Self-pay | Admitting: Family Medicine

## 2022-03-11 ENCOUNTER — Other Ambulatory Visit: Payer: Self-pay

## 2022-03-11 ENCOUNTER — Encounter: Payer: Self-pay | Admitting: Family Medicine

## 2022-03-11 MED ORDER — DOXYCYCLINE HYCLATE 100 MG PO TABS
100.0000 mg | ORAL_TABLET | Freq: Two times a day (BID) | ORAL | 0 refills | Status: DC
  Filled 2022-03-11: qty 20, 10d supply, fill #0

## 2022-03-11 NOTE — Addendum Note (Signed)
Addended by: Donnamae Jude on: 03/11/2022 10:24 AM   Modules accepted: Orders

## 2022-03-12 ENCOUNTER — Other Ambulatory Visit: Payer: Self-pay

## 2022-03-12 MED ORDER — TRULICITY 1.5 MG/0.5ML ~~LOC~~ SOAJ
1.5000 mg | SUBCUTANEOUS | 0 refills | Status: DC
  Filled 2022-03-12: qty 2, 28d supply, fill #0

## 2022-03-16 ENCOUNTER — Encounter: Payer: Self-pay | Admitting: Family Medicine

## 2022-03-16 ENCOUNTER — Other Ambulatory Visit: Payer: Self-pay

## 2022-03-17 ENCOUNTER — Other Ambulatory Visit: Payer: Self-pay

## 2022-03-17 ENCOUNTER — Other Ambulatory Visit: Payer: Self-pay | Admitting: Nurse Practitioner

## 2022-03-17 DIAGNOSIS — E1165 Type 2 diabetes mellitus with hyperglycemia: Secondary | ICD-10-CM

## 2022-03-18 ENCOUNTER — Other Ambulatory Visit: Payer: Self-pay

## 2022-03-18 MED ORDER — METFORMIN HCL 500 MG PO TABS
1000.0000 mg | ORAL_TABLET | Freq: Two times a day (BID) | ORAL | 1 refills | Status: DC
  Filled 2022-03-18: qty 120, 30d supply, fill #0

## 2022-03-23 ENCOUNTER — Other Ambulatory Visit: Payer: Self-pay | Admitting: Nurse Practitioner

## 2022-03-23 ENCOUNTER — Encounter: Payer: Self-pay | Admitting: Nurse Practitioner

## 2022-03-23 ENCOUNTER — Other Ambulatory Visit: Payer: Self-pay

## 2022-03-23 MED ORDER — NORETHINDRONE ACETATE 5 MG PO TABS
15.0000 mg | ORAL_TABLET | Freq: Every day | ORAL | 0 refills | Status: DC
  Filled 2022-03-23: qty 30, 10d supply, fill #0

## 2022-03-24 ENCOUNTER — Other Ambulatory Visit: Payer: Self-pay

## 2022-03-26 ENCOUNTER — Other Ambulatory Visit: Payer: Self-pay

## 2022-04-01 ENCOUNTER — Ambulatory Visit
Admission: RE | Admit: 2022-04-01 | Discharge: 2022-04-01 | Disposition: A | Discharge status: Home / Self Care | Payer: Self-pay | Source: Ambulatory Visit

## 2022-04-01 ENCOUNTER — Encounter: Payer: Self-pay | Admitting: Nurse Practitioner

## 2022-04-01 ENCOUNTER — Emergency Department
Admission: EM | Admit: 2022-04-01 | Discharge: 2022-04-01 | Disposition: A | Discharge status: Home / Self Care | Payer: Self-pay | Attending: Emergency Medicine | Admitting: Emergency Medicine

## 2022-04-01 ENCOUNTER — Emergency Department: Payer: Self-pay

## 2022-04-01 ENCOUNTER — Other Ambulatory Visit: Payer: Self-pay | Admitting: Nurse Practitioner

## 2022-04-01 ENCOUNTER — Telehealth: Payer: Self-pay | Admitting: Family Medicine

## 2022-04-01 DIAGNOSIS — N939 Abnormal uterine and vaginal bleeding, unspecified: Secondary | ICD-10-CM | POA: Insufficient documentation

## 2022-04-01 DIAGNOSIS — Z1231 Encounter for screening mammogram for malignant neoplasm of breast: Secondary | ICD-10-CM

## 2022-04-01 DIAGNOSIS — R102 Pelvic and perineal pain: Secondary | ICD-10-CM | POA: Insufficient documentation

## 2022-04-01 DIAGNOSIS — R2231 Localized swelling, mass and lump, right upper limb: Secondary | ICD-10-CM

## 2022-04-01 DIAGNOSIS — N3001 Acute cystitis with hematuria: Secondary | ICD-10-CM | POA: Insufficient documentation

## 2022-04-01 LAB — CHLAMYDIA/NGC RT PCR (ARMC ONLY)
Chlamydia Tr: NOT DETECTED
N gonorrhoeae: NOT DETECTED

## 2022-04-01 LAB — CBC
HCT: 31.2 % — ABNORMAL LOW (ref 36.0–46.0)
Hemoglobin: 9.4 g/dL — ABNORMAL LOW (ref 12.0–15.0)
MCH: 22.9 pg — ABNORMAL LOW (ref 26.0–34.0)
MCHC: 30.1 g/dL (ref 30.0–36.0)
MCV: 76.1 fL — ABNORMAL LOW (ref 80.0–100.0)
Platelets: 610 10*3/uL — ABNORMAL HIGH (ref 150–400)
RBC: 4.1 MIL/uL (ref 3.87–5.11)
RDW: 16.2 % — ABNORMAL HIGH (ref 11.5–15.5)
WBC: 8 10*3/uL (ref 4.0–10.5)
nRBC: 0 % (ref 0.0–0.2)

## 2022-04-01 LAB — TYPE AND SCREEN
ABO/RH(D): O POS
Antibody Screen: NEGATIVE

## 2022-04-01 LAB — WET PREP, GENITAL
Clue Cells Wet Prep HPF POC: NONE SEEN
Sperm: NONE SEEN
Trich, Wet Prep: NONE SEEN
WBC, Wet Prep HPF POC: <10 (ref <10)
Yeast Wet Prep HPF POC: NONE SEEN

## 2022-04-01 LAB — URINALYSIS, ROUTINE W REFLEX MICROSCOPIC
Bilirubin Urine: NEGATIVE
Glucose, UA: NEGATIVE mg/dL
Ketones, ur: NEGATIVE mg/dL
Nitrite: NEGATIVE
Protein, ur: 100 mg/dL — AB
RBC / HPF: >50 RBC/hpf — ABNORMAL HIGH (ref 0–5)
Specific Gravity, Urine: 1.018 (ref 1.005–1.030)
pH: 6 (ref 5.0–8.0)

## 2022-04-01 LAB — COMPREHENSIVE METABOLIC PANEL
ALT: 19 U/L (ref 0–44)
AST: 21 U/L (ref 15–41)
Albumin: 4.1 g/dL (ref 3.5–5.0)
Alkaline Phosphatase: 52 U/L (ref 38–126)
Anion gap: 8 (ref 5–15)
BUN: 11 mg/dL (ref 6–20)
CO2: 19 mmol/L — ABNORMAL LOW (ref 22–32)
Calcium: 9.2 mg/dL (ref 8.9–10.3)
Chloride: 109 mmol/L (ref 98–111)
Creatinine, Ser: 0.84 mg/dL (ref 0.44–1.00)
GFR, Estimated: >60 mL/min (ref >60)
Glucose, Bld: 129 mg/dL — ABNORMAL HIGH (ref 70–99)
Potassium: 3.5 mmol/L (ref 3.5–5.1)
Sodium: 136 mmol/L (ref 135–145)
Total Bilirubin: 0.5 mg/dL (ref 0.3–1.2)
Total Protein: 8 g/dL (ref 6.5–8.1)

## 2022-04-01 LAB — PREGNANCY, URINE: Preg Test, Ur: NEGATIVE

## 2022-04-01 MED ORDER — CEPHALEXIN 500 MG PO CAPS: 500.0000 mg | ORAL_CAPSULE | Freq: Four times a day (QID) | ORAL | 0 refills | Status: AC

## 2022-04-01 MED ORDER — CEPHALEXIN 500 MG PO CAPS
500.0000 mg | ORAL_CAPSULE | Freq: Once | ORAL | Status: AC
  Administered 2022-04-01: 500 mg via ORAL
  Filled 2022-04-01: qty 1

## 2022-04-01 NOTE — ED Triage Notes (Addendum)
Pt sts that she had a infection in her uterus and was taking abx for it. Pt is now taking medication to stop the vaginal bleeding. Pt sts that she has been bleeding for the last three months and has not stopped. Pt sts that she goes through about 8-10 pads a day.

## 2022-04-01 NOTE — Discharge Instructions (Addendum)
Dear Tonya Paul,  Thank you for coming in for your visit today. We appreciate your efforts in taking care of your health and addressing your concerns. Based on our discussion, please follow the instructions below:  1. We will be conducting a urine test to check for a UTI. 2. An ultrasound will be repeated to assess the current status of your uterine fibroids and cyst. 3. You will need to do a self-swab to test for infections. 4. Continue taking Norethindrone as prescribed by your doctor. 5. Monitor your symptoms and report any changes or worsening to your healthcare provider. 6. Attend your follow-up appointment with your OBGYN, Dr. Darron Doom, on the 25th of this month.   If you have any questions or need further assistance, do not hesitate to reach out to [place].  Wishing you the best in your health journey.  Sincerely,  Pharmacist, [facility]

## 2022-04-01 NOTE — ED Notes (Signed)
Pt departed before vitals taken and receiving discharge instructions. No discharge signature.

## 2022-04-01 NOTE — ED Notes (Signed)
Pt had been placed for discharge. Found not to be in room before discharge paperwork could be physically given, unable to obtain vitals or discharge signature.

## 2022-04-01 NOTE — Telephone Encounter (Signed)
Patient state she have been bleeding for 3 months and today she is not feeling well at all, said she is very light headed, want to speak with a nurse

## 2022-04-01 NOTE — ED Provider Notes (Signed)
[facility] Provider Note  Patient Contact: 4:02 PM (approximate)   History   Vaginal Bleeding   HPI  Tonya Paul is a 47 y.o. female  presents with a chief complaint of ongoing vaginal bleeding since the end of October, with moderate bleeding beginning in November. She reports taking Norethindrone ('5mg'$  tablets )since December, initially two pills once a day, then increased to three pills, but without any improvement in bleeding. She describes passing blood clots larger than her hand.  Patient states that she continues to experience severe pain in her lower abdomen and suspects a urinary tract infection (UTI) due to malodorous urine and painful urination. She has a history of frequent UTIs.  She would also like treatment for STDs.  The patient also reports lower right back pain and persistent nausea. She was diagnosed with bacterial vaginosis (BV) in December but could not complete the prescribed treatment with Flagyl and Clindagel due to allergic reactions, including rapid heartbeat and severe itching with both medications.  Patient's last pelvic ultrasound indicated 3 uterine fibroids, the largest measuring 2.7 cm and a 3.6 cm left-sided ovarian cyst.  Patient reports that she has a follow-up appointment with her OB/GYN on January 27.  No chest pain, chest tightness or shortness of breath.  No fever.     Physical Exam   Triage Vital Signs: ED Triage Vitals  Enc Vitals Group     BP 04/01/22 1500 (!) 138/96     Pulse Rate 04/01/22 1500 87     Resp 04/01/22 1500 18     Temp 04/01/22 1500 98.4 F (36.9 C)     Temp Source 04/01/22 1500 Oral     SpO2 04/01/22 1500 98 %     Weight 04/01/22 1501 201 lb (91.2 kg)     Height --      Head Circumference --      Peak Flow --      Pain Score 04/01/22 1501 10     Pain Loc --      Pain Edu? --      Excl. in [place]? --     Most recent vital signs: Vitals:   04/01/22 1500  BP: (!) 138/96  Pulse: 87  Resp: 18   Temp: 98.4 F (36.9 C)  SpO2: 98%     General: Alert and in no acute distress. Eyes:  PERRL. EOMI. Head: No acute traumatic findings ENT:      Nose: No congestion/rhinnorhea.      Mouth/Throat: Mucous membranes are moist. Neck: No stridor. No cervical spine tenderness to palpation. Cardiovascular:  Good peripheral perfusion Respiratory: Normal respiratory effort without tachypnea or retractions. Lungs CTAB. Good air entry to the bases with no decreased or absent breath sounds. Gastrointestinal: Bowel sounds 4 quadrants. Soft and nontender to palpation. No guarding or rigidity. No palpable masses. No distention. No CVA tenderness. Musculoskeletal: Full range of motion to all extremities.  Neurologic:  No gross focal neurologic deficits are appreciated.  Skin:   No rash noted Other:   ED Results / Procedures / Treatments   Labs (all labs ordered are listed, but only abnormal results are displayed) Labs Reviewed  CBC - Abnormal; Notable for the following components:      Result Value   Hemoglobin 9.4 (*)    HCT 31.2 (*)    MCV 76.1 (*)    MCH 22.9 (*)    RDW 16.2 (*)    Platelets 610 (*)    All other  components within normal limits  COMPREHENSIVE METABOLIC PANEL - Abnormal; Notable for the following components:   CO2 19 (*)    Glucose, Bld 129 (*)    All other components within normal limits  URINALYSIS, ROUTINE W REFLEX MICROSCOPIC - Abnormal; Notable for the following components:   Color, Urine AMBER (*)    APPearance CLOUDY (*)    Hgb urine dipstick LARGE (*)    Protein, ur 100 (*)    Leukocytes,Ua SMALL (*)    RBC / HPF >50 (*)    Bacteria, UA MANY (*)    All other components within normal limits  CHLAMYDIA/NGC RT PCR ([facility] ONLY)            WET PREP, GENITAL  PREGNANCY, URINE  TYPE AND SCREEN       RADIOLOGY  I personally viewed and evaluated these images as part of my medical decision making, as well as reviewing the written report by the  radiologist.  ED Provider Interpretation: Patient has 3 distinct uterine fibroids consistent with prior pelvic ultrasound.   PROCEDURES:  Critical Care performed: No  Procedures   MEDICATIONS ORDERED IN ED: Medications  cephALEXin (KEFLEX) capsule 500 mg (has no administration in time range)     IMPRESSION / MDM / ASSESSMENT AND PLAN / ED COURSE  I reviewed the triage vital signs and the nursing notes.                              Assessment and plan: Vaginal bleeding Pelvic pain Routine encounter for STD exposure Dysuria 47 year old female presents to the emergency department with prolonged vaginal bleeding despite taking norethindrone and being well followed by OB/GYN.  Patient also concern for dysuria and persistent pelvic pain.  Vital signs reassuring at triage.  On exam, patient alert, active and nontoxic-appearing.  Abdomen was soft and nontender.  Will repeat wet prep and gonorrhea and Chlamydia testing and obtain pelvic ultrasound.  Urinalysis in process.  Patient's CBC indicated hemoglobin largely consistent with baseline.  CMP within range.  Pelvic ultrasound consistent with prior.  Patient's urinalysis was concerning for UTI and she was given her first dose of Keflex in the emergency department.  Patient was discharged with Keflex.  She was advised to keep her appointment with OB/GYN for later in the month.  Return precautions were given to return with new or worsening symptoms.   FINAL CLINICAL IMPRESSION(S) / ED DIAGNOSES   Final diagnoses:  Vaginal bleeding  Acute cystitis with hematuria     Rx / DC Orders   ED Discharge Orders     None        Note:  This document was prepared using Dragon voice recognition software and may include unintentional dictation errors.   Vallarie Mare [place], Hershal Coria 04/01/22 Modena Slater, MD 04/02/22 1925

## 2022-04-01 NOTE — Telephone Encounter (Signed)
Called pt; pt currently in ED for evaluation. Spoke with patient briefly who states she is still taking medication for bleeding but it is not helping. Call discontinued. Called patient back; no answer. Will contact patient at a later time to establish follow up.

## 2022-04-02 ENCOUNTER — Other Ambulatory Visit: Payer: Self-pay

## 2022-04-02 DIAGNOSIS — R2231 Localized swelling, mass and lump, right upper limb: Secondary | ICD-10-CM

## 2022-04-02 NOTE — Addendum Note (Signed)
Addended by: Royston Bake on: 04/02/2022 09:58 AM   Modules accepted: Orders

## 2022-04-02 NOTE — Progress Notes (Signed)
Thank you. We will see her again in BCCCP so she can get her imaging. We will reach out to her to get her scheduled.

## 2022-04-02 NOTE — Addendum Note (Signed)
Addended by: Jonna Clark E on: 04/02/2022 10:02 AM   Modules accepted: Orders

## 2022-04-02 NOTE — Addendum Note (Signed)
Addended by: Royston Bake on: 04/02/2022 09:57 AM   Modules accepted: Orders

## 2022-04-06 ENCOUNTER — Other Ambulatory Visit: Payer: Self-pay

## 2022-04-06 NOTE — Telephone Encounter (Signed)
Called pt who states bleeding is more mild since ED. Plans to follow up on 04/15/22 with Kennon Rounds, MD. Pt states she was told at ED that she would need a D&C. [place] also done that day which showed fibroids are still a similar size.

## 2022-04-07 ENCOUNTER — Ambulatory Visit: Payer: Self-pay | Admitting: Nurse Practitioner

## 2022-04-09 ENCOUNTER — Other Ambulatory Visit: Payer: Self-pay

## 2022-04-15 ENCOUNTER — Encounter: Payer: Self-pay | Admitting: Family Medicine

## 2022-04-15 ENCOUNTER — Ambulatory Visit: Payer: Self-pay | Admitting: Family Medicine

## 2022-04-15 NOTE — Progress Notes (Signed)
Patient did not keep appointment today. She may call to reschedule.  

## 2022-04-18 ENCOUNTER — Other Ambulatory Visit: Payer: Self-pay | Admitting: Family Medicine

## 2022-04-18 DIAGNOSIS — E1165 Type 2 diabetes mellitus with hyperglycemia: Secondary | ICD-10-CM

## 2022-04-19 ENCOUNTER — Other Ambulatory Visit: Payer: Self-pay

## 2022-04-19 MED ORDER — BASAGLAR KWIKPEN 100 UNIT/ML ~~LOC~~ SOPN
32.0000 [IU] | PEN_INJECTOR | Freq: Every day | SUBCUTANEOUS | 2 refills | Status: DC
  Filled 2022-04-19: qty 9, 28d supply, fill #0

## 2022-04-19 NOTE — Telephone Encounter (Signed)
Requested Prescriptions  Pending Prescriptions Disp Refills   Insulin Glargine (BASAGLAR KWIKPEN) 100 UNIT/ML 15 mL 2    Sig: Inject 32 Units into the skin daily.     Endocrinology:  Diabetes - Insulins Failed - 04/18/2022  7:47 PM      Failed - HBA1C is between 0 and 7.9 and within 180 days    HbA1c, POC (controlled diabetic range)  Date Value Ref Range Status  01/05/2022 8.1 (A) 0.0 - 7.0 % Final         Passed - Valid encounter within last 6 months    Recent Outpatient Visits           3 months ago Type 2 diabetes mellitus with hyperglycemia, with long-term current use of insulin [facility])   [place], [place] W, NP   8 months ago Type 2 diabetes mellitus with hyperglycemia, with long-term current use of insulin [facility])   [place], [place] W, NP   11 months ago Type 2 diabetes mellitus with hyperglycemia, with long-term current use of insulin [facility])   [place], [place], [place]   1 year ago Type 2 diabetes mellitus with hyperglycemia, with long-term current use of insulin [facility])   [place], [place] W, NP   1 year ago Insulin dependent type 2 diabetes mellitus [facility])   [place], Jarome Matin, RPH-CPP       Future Appointments             In 1 week Thereasa Solo, Dionne Bucy, PA-C [place]

## 2022-04-21 ENCOUNTER — Other Ambulatory Visit: Payer: Self-pay

## 2022-04-27 ENCOUNTER — Ambulatory Visit: Payer: Self-pay

## 2022-04-27 ENCOUNTER — Other Ambulatory Visit: Payer: Self-pay

## 2022-04-28 ENCOUNTER — Encounter: Payer: Self-pay | Admitting: Physician Assistant

## 2022-04-28 ENCOUNTER — Ambulatory Visit: Payer: Self-pay | Attending: Physician Assistant | Admitting: Physician Assistant

## 2022-04-28 ENCOUNTER — Other Ambulatory Visit: Payer: Self-pay

## 2022-04-28 VITALS — BP 132/82 | HR 82 | Wt 206.2 lb

## 2022-04-28 DIAGNOSIS — E785 Hyperlipidemia, unspecified: Secondary | ICD-10-CM

## 2022-04-28 DIAGNOSIS — E1165 Type 2 diabetes mellitus with hyperglycemia: Secondary | ICD-10-CM

## 2022-04-28 DIAGNOSIS — Z794 Long term (current) use of insulin: Secondary | ICD-10-CM

## 2022-04-28 DIAGNOSIS — I10 Essential (primary) hypertension: Secondary | ICD-10-CM

## 2022-04-28 LAB — GLUCOSE, POCT (MANUAL RESULT ENTRY): POC Glucose: 120 mg/dl — AB (ref 70–99)

## 2022-04-28 LAB — POCT GLYCOSYLATED HEMOGLOBIN (HGB A1C): HbA1c, POC (controlled diabetic range): 7.8 % — AB (ref 0.0–7.0)

## 2022-04-28 MED ORDER — PROPRANOLOL HCL ER 120 MG PO CP24
120.0000 mg | ORAL_CAPSULE | Freq: Every day | ORAL | 1 refills | Status: DC
  Filled 2022-04-28: qty 90, 90d supply, fill #0
  Filled 2022-05-25: qty 30, 30d supply, fill #0
  Filled 2022-07-01 – 2022-07-15 (×2): qty 30, 30d supply, fill #1
  Filled 2022-08-23: qty 30, 30d supply, fill #2
  Filled 2022-10-01 (×2): qty 30, 30d supply, fill #3
  Filled 2022-11-10 – 2022-11-18 (×3): qty 30, 30d supply, fill #4

## 2022-04-28 MED ORDER — BASAGLAR KWIKPEN 100 UNIT/ML ~~LOC~~ SOPN
36.0000 [IU] | PEN_INJECTOR | Freq: Every day | SUBCUTANEOUS | 2 refills | Status: DC
  Filled 2022-04-28 – 2022-05-25 (×2): qty 15, 41d supply, fill #0
  Filled 2022-07-01: qty 12, 33d supply, fill #1
  Filled 2022-07-01: qty 15, 41d supply, fill #1

## 2022-04-28 MED ORDER — METFORMIN HCL 500 MG PO TABS
1000.0000 mg | ORAL_TABLET | Freq: Two times a day (BID) | ORAL | 1 refills | Status: DC
  Filled 2022-04-28: qty 360, 90d supply, fill #0
  Filled 2022-05-25: qty 120, 30d supply, fill #0
  Filled 2022-10-01 (×2): qty 120, 30d supply, fill #1
  Filled 2023-02-14 – 2023-02-15 (×2): qty 120, 30d supply, fill #2

## 2022-04-28 MED ORDER — ATORVASTATIN CALCIUM 20 MG PO TABS
20.0000 mg | ORAL_TABLET | Freq: Every day | ORAL | 3 refills | Status: DC
  Filled 2022-04-28: qty 90, fill #0
  Filled 2022-10-01: qty 90, 90d supply, fill #0
  Filled 2022-10-01: qty 30, 30d supply, fill #0
  Filled 2022-12-28: qty 30, 30d supply, fill #1

## 2022-04-28 NOTE — Progress Notes (Signed)
Patient ID: Tonya Paul, female   DOB: [birth year], 47 y.o.   MRN: [id number]     Tonya Paul, is a 47 y.o. female  TIW:[id number]  SNK:[id number]  DOB - 1976-03-11  Chief Complaint  Patient presents with   Diabetes       Subjective:   Tonya Paul is a 47 y.o. female here today for a follow up visit After being seen 04/01/2022 for DUB and UTI.  She has an appt with gyn.  She is taking iron qod.  The UTI is gone.  Bleeding has stopped.  Compliant with medications.  Ozempic was only started 2 weeks ago.  No new issues or concerns   From ED note: Assessment and plan: Vaginal bleeding Pelvic pain Routine encounter for STD exposure Dysuria 47 year old female presents to the emergency department with prolonged vaginal bleeding despite taking norethindrone and being well followed by OB/GYN.  Patient also concern for dysuria and persistent pelvic pain.   Vital signs reassuring at triage.  On exam, patient alert, active and nontoxic-appearing.  Abdomen was soft and nontender.   Will repeat wet prep and gonorrhea and Chlamydia testing and obtain pelvic ultrasound.  Urinalysis in process.   Patient's CBC indicated hemoglobin largely consistent with baseline.  CMP within range.   Pelvic ultrasound consistent with prior.  Patient's urinalysis was concerning for UTI and she was given her first dose of Keflex in the emergency department.  Patient was discharged with Keflex.  She was advised to keep her appointment with OB/GYN for later in the month.  Return precautions were given to return with new or worsening symptoms. No problems updated.  ALLERGIES: Allergies  Allergen Reactions   Ace Inhibitors Shortness Of Breath    Reported by the patient after a trial of lisinopril prescribes by PCP   Aspirin Anaphylaxis   Rocephin [Ceftriaxone] Itching   Metronidazole Itching   Ciprofloxacin Itching   Prednisone Palpitations    PAST MEDICAL HISTORY: Past Medical History:  Diagnosis Date    Diabetes mellitus without complication ([place])    Hemorrhoids 11/18/2015   Hypertension    Left breast mass 09/01/2015   Long Q-T syndrome 01/14/2017   Long QT syndrome   Palpitations 02/03/2016   PVCs seen on monitor   Vertigo     MEDICATIONS AT HOME: Prior to Admission medications   Medication Sig Start Date End Date Taking? Authorizing Provider  alum & mag hydroxide-simeth (MAALOX MAX) 400-400-40 MG/5ML suspension Take 10 mLs by mouth every 6 (six) hours as needed for indigestion. Patient not taking: Reported on 03/04/2022 12/04/21   Redwine, Madison A, PA-C  atorvastatin (LIPITOR) 20 MG tablet Take 1 tablet (20 mg total) by mouth daily. 04/28/22 04/28/23  Argentina Donovan, PA-C  cetirizine (ZYRTEC ALLERGY) 10 MG tablet Take 1 tablet (10 mg total) by mouth at bedtime. Patient not taking: Reported on 03/04/2022 12/30/21 06/28/22  Lynden Oxford Scales, PA-C  EPINEPHrine 0.3 mg/0.3 mL IJ SOAJ injection Inject 0.3 mg into the muscle as needed for anaphylaxis. 05/20/21   Argentina Donovan, PA-C  glucose blood (TRUE METRIX BLOOD GLUCOSE TEST) test strip Use as instructed 10/29/20   Gildardo Pounds, NP  ibuprofen (ADVIL) 600 MG tablet Take 1 tablet (600 mg total) by mouth every 8 (eight) hours as needed. Patient not taking: Reported on 03/04/2022 02/15/22   Gildardo Pounds, NP  Insulin Glargine [facility]) 100 UNIT/ML Inject 36 Units into the skin daily. 04/28/22   Argentina Donovan, PA-C  metFORMIN (GLUCOPHAGE)  500 MG tablet Take 2 tablets (1,000 mg total) by mouth 2 (two) times daily with a meal. 04/28/22   Isamar Wellbrock, Dionne Bucy, PA-C  montelukast (SINGULAIR) 10 MG tablet Take 1 tablet (10 mg total) by mouth at bedtime. 12/30/21 06/28/22  Lynden Oxford Scales, PA-C  norethindrone (AYGESTIN) 5 MG tablet Take 3 tablets (15 mg total) by mouth daily for 10 days. 03/23/22 04/03/22  Gildardo Pounds, NP  propranolol ER (INDERAL LA) 120 MG 24 hr capsule Take 1 capsule (120 mg total) by mouth daily. 04/28/22    Argentina Donovan, PA-C  Semaglutide, 1 MG/DOSE, 4 MG/3ML SOPN Inject 1 mg as directed once a week. 01/05/22   Charlott Rakes, MD  TRUEplus Lancets 28G MISC Use as instructed. Check blood glucose level by fingerstick twice per day. 10/29/20   Gildardo Pounds, NP  lisinopril (ZESTRIL) 5 MG tablet Take 1 tablet (5 mg total) by mouth daily. 01/04/20 03/01/20  Lorretta Harp, MD    ROS: Neg HEENT Neg resp Neg cardiac Neg GI Neg MS Neg psych Neg neuro  Objective:   Vitals:   04/28/22 1541  BP: 132/82  Pulse: 82  SpO2: 99%  Weight: 206 lb 3.2 oz (93.5 kg)   Exam General appearance : Awake, alert, not in any distress. Speech Clear. Not toxic looking HEENT: Atraumatic and Normocephalic Neck: Supple, no JVD. No cervical lymphadenopathy.  Chest: Good air entry bilaterally, CTAB.  No rales/rhonchi/wheezing CVS: S1 S2 regular, no murmurs.  Extremities: B/L Lower Ext shows no edema, both legs are warm to touch Neurology: Awake alert, and oriented X 3, CN II-XII intact, Non focal Skin: No Rash  Data Review Lab Results  Component Value Date   HGBA1C 7.8 (A) 04/28/2022   HGBA1C 8.1 (A) 01/05/2022   HGBA1C 8.1 (A) 08/21/2021    Assessment & Plan   1. Type 2 diabetes mellitus with hyperglycemia, with long-term current use of insulin (HCC) Uncontrolled but improved.  Just started ozempic 2 weeks ago as well - Glucose (CBG) - HgB A1c - Insulin Glargine (BASAGLAR KWIKPEN) 100 UNIT/ML; Inject 36 Units into the skin daily.  Dispense: 15 mL; Refill: 2 - metFORMIN (GLUCOPHAGE) 500 MG tablet; Take 2 tablets (1,000 mg total) by mouth 2 (two) times daily with a meal.  Dispense: 360 tablet; Refill: 1  2. Dyslipidemia, goal LDL below 70 - atorvastatin (LIPITOR) 20 MG tablet; Take 1 tablet (20 mg total) by mouth daily.  Dispense: 90 tablet; Refill: 3  3. Essential hypertension - propranolol ER (INDERAL LA) 120 MG 24 hr capsule; Take 1 capsule (120 mg total) by mouth daily.  Dispense: 90  capsule; Refill: 1  Recent labs reviewed and unremarkable other than Hgb 9.4 (range 9.4 to 10.6 over the last 9 months and she just resumed iron qod)  Return in about 3 months (around 07/27/2022) for pcp for chronic conditions.  The patient was given clear instructions to go to ER or return to medical center if symptoms don't improve, worsen or new problems develop. The patient verbalized understanding. The patient was told to call to get lab results if they haven't heard anything in the next week.      Freeman Caldron, PA-C [facility], [place]   04/28/2022, 4:45 PM

## 2022-05-05 ENCOUNTER — Other Ambulatory Visit: Payer: Self-pay

## 2022-05-11 ENCOUNTER — Ambulatory Visit: Payer: Self-pay | Admitting: *Deleted

## 2022-05-11 ENCOUNTER — Ambulatory Visit
Admission: RE | Admit: 2022-05-11 | Discharge: 2022-05-11 | Disposition: A | Discharge status: Home / Self Care | Payer: No Typology Code available for payment source | Source: Ambulatory Visit | Attending: Obstetrics and Gynecology | Admitting: Obstetrics and Gynecology

## 2022-05-11 ENCOUNTER — Ambulatory Visit
Admission: RE | Admit: 2022-05-11 | Discharge: 2022-05-11 | Disposition: A | Discharge status: Home / Self Care | Payer: Self-pay | Source: Ambulatory Visit | Attending: Obstetrics and Gynecology | Admitting: Obstetrics and Gynecology

## 2022-05-11 VITALS — BP 140/86 | Wt 203.9 lb

## 2022-05-11 DIAGNOSIS — R2231 Localized swelling, mass and lump, right upper limb: Secondary | ICD-10-CM

## 2022-05-11 DIAGNOSIS — Z1239 Encounter for other screening for malignant neoplasm of breast: Secondary | ICD-10-CM

## 2022-05-11 NOTE — Progress Notes (Signed)
Ms. Daffany Tobin is a 47 y.o. female who presents to [facility] clinic today with complaint of right axillary lump since May of 2023 that has increased in size and painful. Patient stated the pain comes and goes. Patient rates the pain at a 4 out of 10.    Pap Smear: Pap smear not completed today. Last Pap smear was 02/01/2022 at the free cervical cancer screening clinic and was normal with negative HPV. Per patient has no history of an abnormal Pap smear. Last Pap smear result is available in Epic.   Physical exam: Breasts Breasts symmetrical. No skin abnormalities bilateral breasts. No nipple retraction bilateral breasts. No nipple discharge bilateral breasts. No lymphadenopathy. No lumps palpated left breast. Palpated a lump within the right axilla between 9:30-11 o'clock 12 cm from the nipple. No complaints of pain or tenderness on exam.      MS DIGITAL DIAG UNI RIGHT  Result Date: 09/08/2021 CLINICAL DATA:  47 year old female with a palpable right axillary lump and provider palpated right breast lump. EXAM: DIGITAL DIAGNOSTIC UNILATERAL RIGHT MAMMOGRAM; ULTRASOUND RIGHT BREAST LIMITED TECHNIQUE: Right digital diagnostic mammography was performed. Mammographic images were processed with CAD.; Targeted ultrasound examination of the right breast was performed COMPARISON:  Previous exam(s). ACR Breast Density Category c: The breast tissue is heterogeneously dense, which may obscure small masses. FINDINGS: Radiopaque BBs were placed at the site of the patient's right breast/axillary lumps. No focal or suspicious mammographic findings are seen deep to the radiopaque BBs. No suspicious findings in the remainder of the right breast. Targeted ultrasound is performed, showing an oval, circumscribed fat density mass within the right axilla measuring 5 x 4 x 1.7 cm. Note is made of a few scattered anechoic cysts in the vicinity. Evaluation along the 8 o'clock axis demonstrates no suspicious sonographic findings.  IMPRESSION: 1. No mammographic or sonographic evidence of malignancy in the right breast. 2. 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 Recommend clinical and symptomatic follow-up. RECOMMENDATION: The patient may resume routine annual screening due in January 2024. I have discussed the findings and recommendations with the patient. If applicable, a reminder letter will be sent to the patient regarding the next appointment. BI-RADS CATEGORY  2: Benign. Electronically Signed   By: Kristopher Oppenheim M.D.   On: 09/08/2021 15:58  MM DIAG BREAST TOMO UNI LEFT  Result Date: 03/30/2021 CLINICAL DATA:  47 year old female presenting for evaluation of new diffuse radiating left breast pain. No associated lump. History of benign left breast biopsy. EXAM: DIGITAL DIAGNOSTIC UNILATERAL LEFT MAMMOGRAM WITH TOMOSYNTHESIS AND CAD TECHNIQUE: Left digital diagnostic mammography and breast tomosynthesis was performed. The images were evaluated with computer-aided detection. COMPARISON:  Previous exam(s). ACR Breast Density Category c: The breast tissue is heterogeneously dense, which may obscure small masses. FINDINGS: Full field tomosynthesis views of the left breast were performed. No suspicious mass, distortion, or microcalcifications are identified to suggest presence of malignancy. Specifically there is no new finding to explain the patient's diffuse radiating pain. IMPRESSION: No mammographic evidence of malignancy in the left breast or other finding to explain the patient's diffuse pain. RECOMMENDATION: 1. Clinical follow-up as needed for the left breast pain. Breast pain is a common condition, which will often resolve on its own without intervention. It can be affected by hormonal changes, medication side effect, weight changes and fit of the bra. Pain may also be referred from other adjacent areas of the body. Breast pain may be improved by wearing adequate well-fitting support, over-the-counter  topical  and oral NSAID medication, low-fat diet, and ice/heat as needed. Studies have shown an improvement in cyclic pain with use of evening primrose oil and vitamin E. 2. Return for routine annual screening mammography which will be due in September 2023. I have discussed the findings and recommendations with the patient. If applicable, a reminder letter will be sent to the patient regarding the next appointment. BI-RADS CATEGORY  1: Negative. Electronically Signed   By: Audie Pinto M.D.   On: 03/30/2021 10:35  MM 3D SCREEN BREAST BILATERAL  Result Date: 12/12/2020 CLINICAL DATA:  Screening. EXAM: DIGITAL SCREENING BILATERAL MAMMOGRAM WITH TOMOSYNTHESIS AND CAD TECHNIQUE: Bilateral screening digital craniocaudal and mediolateral oblique mammograms were obtained. Bilateral screening digital breast tomosynthesis was performed. The images were evaluated with computer-aided detection. COMPARISON:  Previous exam(s). ACR Breast Density Category c: The breast tissue is heterogeneously dense, which may obscure small masses. FINDINGS: There are no findings suspicious for malignancy. IMPRESSION: No mammographic evidence of malignancy. A result letter of this screening mammogram will be mailed directly to the patient. RECOMMENDATION: Screening mammogram in one year. (Code:SM-B-01Y) BI-RADS CATEGORY  1: Negative. Electronically Signed   By: Marin Olp M.D.   On: 12/12/2020 16:05   MM 3D SCREEN BREAST BILATERAL  Result Date: 09/21/2019 CLINICAL DATA:  Screening. EXAM: DIGITAL SCREENING BILATERAL MAMMOGRAM WITH TOMO AND CAD COMPARISON:  Previous exam(s). ACR Breast Density Category d: The breast tissue is extremely dense, which lowers the sensitivity of mammography FINDINGS: There are no findings suspicious for malignancy. Images were processed with CAD. IMPRESSION: No mammographic evidence of malignancy. A result letter of this screening mammogram will be mailed directly to the patient. RECOMMENDATION: Screening  mammogram in one year. (Code:SM-B-01Y) BI-RADS CATEGORY  1: Negative. Electronically Signed   By: Lovey Newcomer M.D.   On: 09/21/2019 16:53   MS DIGITAL SCREENING TOMO BILATERAL  Result Date: 09/20/2018 CLINICAL DATA:  Screening. EXAM: DIGITAL SCREENING BILATERAL MAMMOGRAM WITH TOMO AND CAD COMPARISON:  Previous exam(s). ACR Breast Density Category c: The breast tissue is heterogeneously dense, which may obscure small masses. FINDINGS: There are no findings suspicious for malignancy. Images were processed with CAD. IMPRESSION: No mammographic evidence of malignancy. A result letter of this screening mammogram will be mailed directly to the patient. RECOMMENDATION: Screening mammogram in one year. (Code:SM-B-01Y) BI-RADS CATEGORY  1: Negative. Electronically Signed   By: Kristopher Oppenheim M.D.   On: 09/20/2018 10:44     Pelvic/Bimanual Pap is not indicated today per BCCCP guidelines.    Smoking History: Patient has never smoked.    Patient Navigation: Patient education provided. Access to services provided for patient through [place] program.   Colorectal Cancer Screening: Per patient has never had colonoscopy completed. No complaints today.    Breast and Cervical Cancer Risk Assessment: Patient has family history of a paternal aunt having breast cancer. Patient has no known history of genetic mutations or history of radiation treatment to the chest before age 33. Patient does not have history of cervical dysplasia, immunocompromised, or DES exposure in-utero.   Risk Scores as of 05/11/2022     [place]           5-year 1.01 %   Lifetime 9.21 %            Last calculated by Claretha Cooper, CMA on 05/11/2022 at  2:26 PM        A: BCCCP exam without pap smear Complaint of right axillary lump that has increased in size.  P: Referred patient to the [facility] for a diagnostic mammogram. Appointment scheduled Tuesday, May 11, 2022 at 1520.  Loletta Parish,  RN 05/11/2022 2:29 PM

## 2022-05-11 NOTE — Patient Instructions (Signed)
Explained breast self awareness with [name]. Patient did not need a Pap smear today due to last Pap smear and HPV typing was 02/01/2022. Let her know BCCCP will cover Pap smears and HPV typing every 5 years unless has a history of abnormal Pap smears. Referred patient to the [facility] for a diagnostic mammogram. Appointment scheduled Tuesday, May 11, 2022 at 1520. Patient aware of appointment and will be there. [name] verbalized understanding.  Gianni Fuchs, Arvil [place], RN 2:29 PM

## 2022-05-14 ENCOUNTER — Other Ambulatory Visit: Payer: Self-pay

## 2022-05-24 ENCOUNTER — Other Ambulatory Visit: Payer: Self-pay

## 2022-05-25 ENCOUNTER — Other Ambulatory Visit: Payer: Self-pay

## 2022-05-28 ENCOUNTER — Other Ambulatory Visit: Payer: Self-pay

## 2022-05-28 ENCOUNTER — Other Ambulatory Visit: Payer: Self-pay | Admitting: Physician Assistant

## 2022-05-28 DIAGNOSIS — T7840XS Allergy, unspecified, sequela: Secondary | ICD-10-CM

## 2022-05-28 MED ORDER — EPINEPHRINE 0.3 MG/0.3ML IJ SOAJ
0.3000 mg | INTRAMUSCULAR | 1 refills | Status: AC | PRN
  Filled 2022-05-28: qty 2, 9d supply, fill #0
  Filled 2022-07-01: qty 2, 30d supply, fill #0
  Filled 2022-07-01: qty 2, 9d supply, fill #0

## 2022-06-03 ENCOUNTER — Other Ambulatory Visit: Payer: Self-pay

## 2022-06-03 NOTE — Progress Notes (Signed)
Patient outreached by Lazarus Gowda, PharmD Candidate on 06/02/2022 to discuss hypertension   Patient has an automated home blood pressure machine. They report home readings <130/80 and have been much better recently. Patient reports no s/sx of hypertension    Medication review was performed. They are taking medications as prescribed. Differences from their prescribed list include: no differences   The following barriers to adherence were noted:  - They do not have cost concerns.  - They do not have transportation concerns.  - They do not need assistance obtaining refills.  - They do not occasionally forget to take some of their prescribed medications.  - They do not feel like one/some of their medications make them feel poorly.  - They do not have questions or concerns about their medications.  - They do have follow up scheduled with their primary care provider/cardiologist.   The following interventions were completed:  - Medications were reviewed  - Patient was educated on proper technique to check home blood pressure and reminded to bring home machine and readings to next provider appointment   The patient has follow up scheduled:  PCP: Geryl Rankins, 07/27/2022   Lazarus Gowda, PharmD Candidate   Tonya Paul, Pharm.D. PGY-2 Ambulatory Care Pharmacy Resident

## 2022-06-12 ENCOUNTER — Ambulatory Visit: Payer: Self-pay

## 2022-07-01 ENCOUNTER — Other Ambulatory Visit: Payer: Self-pay

## 2022-07-01 ENCOUNTER — Other Ambulatory Visit: Payer: Self-pay | Admitting: Pharmacist

## 2022-07-01 MED ORDER — SEMAGLUTIDE (2 MG/DOSE) 8 MG/3ML ~~LOC~~ SOPN
2.0000 mg | PEN_INJECTOR | SUBCUTANEOUS | 1 refills | Status: DC
  Filled 2022-07-01: qty 9, 84d supply, fill #0
  Filled 2022-08-23: qty 9, 84d supply, fill #1
  Filled 2022-10-01: qty 3, 28d supply, fill #1
  Filled 2022-10-01: qty 9, 84d supply, fill #1
  Filled 2022-11-10 – 2022-11-18 (×3): qty 3, 28d supply, fill #2

## 2022-07-02 ENCOUNTER — Other Ambulatory Visit: Payer: Self-pay

## 2022-07-06 ENCOUNTER — Other Ambulatory Visit: Payer: Self-pay

## 2022-07-08 ENCOUNTER — Ambulatory Visit (INDEPENDENT_AMBULATORY_CARE_PROVIDER_SITE_OTHER): Payer: Self-pay | Admitting: Obstetrics and Gynecology

## 2022-07-08 ENCOUNTER — Other Ambulatory Visit: Payer: Self-pay

## 2022-07-08 VITALS — BP 150/95 | HR 89 | Wt 204.1 lb

## 2022-07-08 DIAGNOSIS — N939 Abnormal uterine and vaginal bleeding, unspecified: Secondary | ICD-10-CM

## 2022-07-08 NOTE — Progress Notes (Signed)
GYNECOLOGY VISIT  Patient name:Tonya Paul MRN [id number]  Date of birth: 1976/01/10 Chief Complaint:   Gynecologic Exam and Follow-up  History:  Tonya Paul is a 47 y.o. G3P2011 being seen today for AUB follow up. Had 3 mtonsh of bleeding, stopped bleeding in Jan. 2 pads/hr - took NETA, didn't help and then it stopped. Had been passing large blood clots and intermittent cramping. LMP last Monday and not bleeding at this time.    Completed ABX and stopped NETA when stopped  Has had 2 menses since, 4 days instead of 7 days   Past Medical History:  Diagnosis Date   Diabetes mellitus without complication (HCC)    Hemorrhoids 11/18/2015   Hypertension    Left breast mass 09/01/2015   Long Q-T syndrome 01/14/2017   Long QT syndrome   Palpitations 02/03/2016   PVCs seen on monitor   Vertigo     Past Surgical History:  Procedure Laterality Date   BREAST BIOPSY Left 09/03/2015   [place] Core bx, benign   ESOPHAGOGASTRODUODENOSCOPY (EGD) WITH PROPOFOL N/A 06/21/2017   Procedure: ESOPHAGOGASTRODUODENOSCOPY (EGD) WITH PROPOFOL;  Surgeon: Toney Reil, MD;  Location: [facility] ENDOSCOPY;  Service: Gastroenterology;  Laterality: N/A;    The following portions of the patient's history were reviewed and updated as appropriate: allergies, current medications, past family history, past medical history, past social history, past surgical history and problem list.   Health Maintenance:   Last pap     Component Value Date/Time   DIAGPAP  02/01/2022 0940    - Negative for intraepithelial lesion or malignancy (NILM)   DIAGPAP  12/18/2018 1915    - Negative for intraepithelial lesion or malignancy (NILM)   HPVHIGH Negative 02/01/2022 0940   HPVHIGH Negative 12/18/2018 1915   ADEQPAP  02/01/2022 0940    Satisfactory for evaluation; transformation zone component PRESENT.   ADEQPAP  12/18/2018 1915    Satisfactory for evaluation; transformation zone component PRESENT.   Last mammogram:  BIRADS 2 04/2022   Review of Systems:  Pertinent items are noted in HPI. Comprehensive review of systems was otherwise negative.   Objective:  Physical Exam BP (!) 150/95   Pulse 89   Wt 204 lb 1.6 oz (92.6 kg)   LMP 06/28/2022   BMI 29.29 kg/m    Physical Exam   Labs and Imaging EXAM: TRANSABDOMINAL AND TRANSVAGINAL ULTRASOUND OF PELVIS   TECHNIQUE: Both transabdominal and transvaginal ultrasound examinations of the pelvis were performed. Transabdominal technique was performed for global imaging of the pelvis including uterus, ovaries, adnexal regions, and pelvic cul-de-sac. It was necessary to proceed with endovaginal exam following the transabdominal exam to visualize the endometrium.   COMPARISON:  Ultrasound from 02/13/2022.   FINDINGS: Uterus   Measurements: 10.1 x 5.6 x 6.2 cm = volume: 185.3 mL. Uterus is anteverted. 3 distinct uterine fibroids are seen as follows;   1. 1.2 x 1.1 x 1.1 cm intramural fibroid present at the right uterine body. 2. 1.6 x 1.9 x 2.5 cm intramural fibroid present at the posterior uterine body. 3. 1.8 x 1.6 x 1.5 cm intramural fibroid present at the left uterine body.   Endometrium   Thickness: 6 mm. Endometrial stripe somewhat difficult to visualize with poorly defined borders. Findings raise the possibility for underlying adenomyosis. No other focal abnormality.   Right ovary   Measurements: 3.1 x 1.9 x 3.5 cm = volume: 10.9 mL. Normal appearance/no adnexal mass.   Left ovary   Measurements: 3.5 x  1.8 x 1.3 cm = volume: 4.5 mL. Normal appearance/no adnexal mass.   Other findings   No abnormal free fluid.   IMPRESSION: 1. Three distinct uterine fibroids as detailed above. 2. Question underlying uterine adenomyosis. 3. Endometrial stripe measures 6 mm in thickness. If bleeding remains unresponsive to hormonal or medical therapy, sonohysterogram should be considered for focal lesion work-up. (Ref:  Radiological Reasoning: Algorithmic Workup of Abnormal Vaginal Bleeding with Endovaginal Sonography and Sonohysterography. AJR 2008; [id number]:[id number]). 4. Normal sonographic appearance of the ovaries. No adnexal mass or free fluid. No other acute finding.     Electronically Signed   By: Rise Mu M.D.   On: 04/01/2022 18:45  FINAL MICROSCOPIC DIAGNOSIS:   A. ENDOMETRIUM, BIOPSY:  - Chronic endometritis, see comment  - Negative for hyperplasia or malignancy      Assessment & Plan:   1. Abnormal uterine bleeding Prior EMB demonstrated chronic endometritis, now s/p appropriate ABX with resumption of normal menses. Will continue surveillance of menses at this time.    Lorriane Shire, MD Minimally Invasive Gynecologic [facility]

## 2022-07-09 ENCOUNTER — Other Ambulatory Visit: Payer: Self-pay

## 2022-07-16 ENCOUNTER — Other Ambulatory Visit: Payer: Self-pay

## 2022-07-27 ENCOUNTER — Ambulatory Visit: Payer: Self-pay | Attending: Nurse Practitioner | Admitting: Nurse Practitioner

## 2022-08-21 ENCOUNTER — Encounter: Payer: Self-pay | Admitting: Nurse Practitioner

## 2022-08-23 ENCOUNTER — Other Ambulatory Visit: Payer: Self-pay | Admitting: Pharmacist

## 2022-08-23 ENCOUNTER — Other Ambulatory Visit: Payer: Self-pay

## 2022-08-23 ENCOUNTER — Other Ambulatory Visit: Payer: Self-pay | Admitting: Nurse Practitioner

## 2022-08-23 DIAGNOSIS — E1165 Type 2 diabetes mellitus with hyperglycemia: Secondary | ICD-10-CM

## 2022-08-23 MED ORDER — BASAGLAR KWIKPEN 100 UNIT/ML ~~LOC~~ SOPN
20.0000 [IU] | PEN_INJECTOR | Freq: Two times a day (BID) | SUBCUTANEOUS | 2 refills | Status: DC
Start: 2022-08-23 — End: 2022-10-01
  Filled 2022-08-23: qty 15, 38d supply, fill #0
  Filled 2022-10-01: qty 15, 38d supply, fill #1

## 2022-08-23 MED ORDER — INSULIN LISPRO (1 UNIT DIAL) 100 UNIT/ML (KWIKPEN)
5.0000 [IU] | PEN_INJECTOR | Freq: Three times a day (TID) | SUBCUTANEOUS | 2 refills | Status: DC
  Filled 2022-08-23: qty 6, 40d supply, fill #0
  Filled 2022-10-01 (×2): qty 6, 40d supply, fill #1
  Filled 2022-11-10 – 2022-11-18 (×3): qty 6, 40d supply, fill #2

## 2022-08-23 MED ORDER — NOVOLOG FLEXPEN 100 UNIT/ML ~~LOC~~ SOPN
5.0000 [IU] | PEN_INJECTOR | Freq: Three times a day (TID) | SUBCUTANEOUS | 11 refills | Status: DC
  Filled 2022-08-23: qty 15, fill #0

## 2022-08-24 ENCOUNTER — Other Ambulatory Visit: Payer: Self-pay

## 2022-08-27 ENCOUNTER — Other Ambulatory Visit: Payer: Self-pay

## 2022-09-03 ENCOUNTER — Other Ambulatory Visit: Payer: Self-pay

## 2022-10-01 ENCOUNTER — Other Ambulatory Visit: Payer: Self-pay | Admitting: Pharmacist

## 2022-10-01 ENCOUNTER — Other Ambulatory Visit: Payer: Self-pay

## 2022-10-01 MED ORDER — LANTUS SOLOSTAR 100 UNIT/ML ~~LOC~~ SOPN
20.0000 [IU] | PEN_INJECTOR | Freq: Two times a day (BID) | SUBCUTANEOUS | 0 refills | Status: DC
  Filled 2022-10-01 – 2022-10-06 (×2): qty 12, 30d supply, fill #0
  Filled 2022-11-10 – 2022-11-29 (×4): qty 12, 30d supply, fill #1
  Filled 2022-12-28 (×3): qty 12, 30d supply, fill #2
  Filled 2023-02-14: qty 12, 30d supply, fill #3
  Filled 2023-02-15: qty 9, 23d supply, fill #3

## 2022-10-02 ENCOUNTER — Encounter: Payer: Self-pay | Admitting: Nurse Practitioner

## 2022-10-04 ENCOUNTER — Ambulatory Visit: Payer: Self-pay | Admitting: Nurse Practitioner

## 2022-10-05 ENCOUNTER — Other Ambulatory Visit: Payer: Self-pay

## 2022-10-06 ENCOUNTER — Other Ambulatory Visit: Payer: Self-pay

## 2022-10-07 ENCOUNTER — Other Ambulatory Visit (HOSPITAL_COMMUNITY): Payer: Self-pay

## 2022-10-07 ENCOUNTER — Other Ambulatory Visit: Payer: Self-pay

## 2022-10-18 ENCOUNTER — Other Ambulatory Visit: Payer: Self-pay

## 2022-10-20 ENCOUNTER — Encounter: Payer: Self-pay | Admitting: Nurse Practitioner

## 2022-11-11 ENCOUNTER — Other Ambulatory Visit: Payer: Self-pay

## 2022-11-11 ENCOUNTER — Encounter: Payer: Self-pay | Admitting: Nurse Practitioner

## 2022-11-12 ENCOUNTER — Other Ambulatory Visit: Payer: Self-pay | Admitting: Nurse Practitioner

## 2022-11-12 DIAGNOSIS — R399 Unspecified symptoms and signs involving the genitourinary system: Secondary | ICD-10-CM

## 2022-11-12 MED ORDER — SULFAMETHOXAZOLE-TRIMETHOPRIM 800-160 MG PO TABS
1.0000 | ORAL_TABLET | Freq: Two times a day (BID) | ORAL | 0 refills | Status: AC
Start: 2022-11-12 — End: 2022-11-15

## 2022-11-18 ENCOUNTER — Other Ambulatory Visit: Payer: Self-pay

## 2022-11-23 ENCOUNTER — Encounter: Payer: Self-pay | Admitting: Pharmacist

## 2022-11-24 ENCOUNTER — Other Ambulatory Visit: Payer: Self-pay

## 2022-11-24 ENCOUNTER — Other Ambulatory Visit: Payer: Self-pay | Admitting: Nurse Practitioner

## 2022-11-24 ENCOUNTER — Encounter: Payer: Self-pay | Admitting: Nurse Practitioner

## 2022-11-26 ENCOUNTER — Other Ambulatory Visit: Payer: Self-pay

## 2022-11-26 ENCOUNTER — Other Ambulatory Visit: Payer: Self-pay | Admitting: Pharmacist

## 2022-11-26 DIAGNOSIS — I10 Essential (primary) hypertension: Secondary | ICD-10-CM

## 2022-11-26 MED ORDER — PROPRANOLOL HCL ER 120 MG PO CP24
120.0000 mg | ORAL_CAPSULE | Freq: Every day | ORAL | 0 refills | Status: DC
Start: 2022-11-26 — End: 2022-11-29

## 2022-11-29 ENCOUNTER — Other Ambulatory Visit: Payer: Self-pay

## 2022-11-29 ENCOUNTER — Encounter: Payer: Self-pay | Admitting: Nurse Practitioner

## 2022-11-29 ENCOUNTER — Ambulatory Visit: Payer: Self-pay | Attending: Nurse Practitioner | Admitting: Nurse Practitioner

## 2022-11-29 VITALS — BP 139/82 | HR 75 | Ht 70.0 in | Wt 206.6 lb

## 2022-11-29 DIAGNOSIS — D649 Anemia, unspecified: Secondary | ICD-10-CM

## 2022-11-29 DIAGNOSIS — Z794 Long term (current) use of insulin: Secondary | ICD-10-CM

## 2022-11-29 DIAGNOSIS — E1165 Type 2 diabetes mellitus with hyperglycemia: Secondary | ICD-10-CM

## 2022-11-29 DIAGNOSIS — I10 Essential (primary) hypertension: Secondary | ICD-10-CM

## 2022-11-29 LAB — POCT GLYCOSYLATED HEMOGLOBIN (HGB A1C): Hemoglobin A1C: 9.2 % — AB (ref 4.0–5.6)

## 2022-11-29 MED ORDER — PROPRANOLOL HCL ER 160 MG PO CP24
160.0000 mg | ORAL_CAPSULE | Freq: Every day | ORAL | 1 refills | Status: DC
Start: 2022-11-29 — End: 2023-06-14
  Filled 2022-11-29 – 2022-12-28 (×2): qty 30, 30d supply, fill #0
  Filled 2023-02-14 – 2023-02-15 (×2): qty 30, 30d supply, fill #1
  Filled 2023-03-28: qty 30, 30d supply, fill #2
  Filled 2023-05-01 – 2023-05-02 (×4): qty 30, 30d supply, fill #3

## 2022-11-29 MED ORDER — GLIPIZIDE 5 MG PO TABS
2.5000 mg | ORAL_TABLET | Freq: Two times a day (BID) | ORAL | 1 refills | Status: DC
Start: 2022-11-29 — End: 2023-06-14
  Filled 2022-11-29: qty 30, 30d supply, fill #0
  Filled 2022-12-28: qty 90, 90d supply, fill #0
  Filled 2023-05-17 (×2): qty 90, 90d supply, fill #1

## 2022-11-29 NOTE — Progress Notes (Signed)
Assessment & Plan:  Tonya Paul" was seen today for medical management of chronic issues.  Diagnoses and all orders for this visit:  Type 2 diabetes mellitus with hyperglycemia, with long-term current use of insulin  Continue Basaglar, Humalog and metformin -     POCT glycosylated hemoglobin (Hb A1C) -     CMP14+EGFR -     glipiZIDE (GLUCOTROL) 5 MG tablet; Take 0.5 tablets (2.5 mg total) by mouth 2 (two) times daily before a meal.  Essential hypertension Dose change propranolol increased from 120-160 -     propranolol ER (INDERAL LA) 160 MG SR capsule; Take 1 capsule (160 mg total) by mouth daily.  Anemia, unspecified type -     CBC with Differential/Platelet    Patient has been counseled on age-appropriate routine health concerns for screening and prevention. These are reviewed and up-to-date. Referrals have been placed accordingly. Immunizations are up-to-date or declined.    Subjective:   Chief Complaint  Patient presents with   Medical Management of Chronic Issues   HPI Tonya Paul 47 y.o. female presents to office today for follow up to DM  She has a past medical history of DM 2 with dietary nonadherence, Hemorrhoids (11/18/2015), Hypertension, Left breast mass (09/01/2015), Long Q-T syndrome (01/14/2017), Palpitations (02/03/2016), and Vertigo.     DM 2 Up from 7.8 to 9.2. Currently not at goal. She has not been tolerating ozempic well due to GI side effects. Currently taking basaglar and humalog as prescribed. She takes metformin TID as she can not tolerate 2 tablets twice a day due to GI side effects. Will add glipizide today. She has an ACE intolerance in the past and developed cough with lisinopril which has been DC'd LDL not at goal with Atorvastatin 20 mg daily. Weight is gradually increasing. She wants to know if she can work out with a prolonged QT interval. Endorses intermittent swelling in RLE after standing for long periods of time at work. Denies any  worsening shortness of breath.  Lab Results  Component Value Date   HGBA1C 9.2 (A) 11/29/2022    Lab Results  Component Value Date   HGBA1C 7.8 (A) 04/28/2022   Lab Results  Component Value Date   LDLCALC 97 05/20/2021      HTN Blood pressure not quite at goal with propranolol 120 mg daily. She states when she was hospitalized last month for hypoglycemia she was given 160mg  while admitted and blood pressure was normal.  BP Readings from Last 3 Encounters:  11/29/22 139/82  07/08/22 (!) 150/95  05/11/22 (!) 140/86    Review of Systems  Constitutional:  Negative for fever, malaise/fatigue and weight loss.  HENT: Negative.  Negative for nosebleeds.   Eyes: Negative.  Negative for blurred vision, double vision and photophobia.  Respiratory: Negative.  Negative for cough and shortness of breath.   Cardiovascular: Negative.  Negative for chest pain, palpitations and leg swelling.  Gastrointestinal: Negative.  Negative for heartburn, nausea and vomiting.  Musculoskeletal: Negative.  Negative for myalgias.  Neurological: Negative.  Negative for dizziness, focal weakness, seizures and headaches.  Psychiatric/Behavioral: Negative.  Negative for suicidal ideas.     Past Medical History:  Diagnosis Date   Diabetes mellitus without complication (HCC)    Hemorrhoids 11/18/2015   Hypertension    Left breast mass 09/01/2015   Long Q-T syndrome 01/14/2017   Long QT syndrome   Palpitations 02/03/2016   PVCs seen on monitor   Vertigo     Past Surgical  History:  Procedure Laterality Date   BREAST BIOPSY Left 09/03/2015   [place] Core bx, benign   ESOPHAGOGASTRODUODENOSCOPY (EGD) WITH PROPOFOL N/A 06/21/2017   Procedure: ESOPHAGOGASTRODUODENOSCOPY (EGD) WITH PROPOFOL;  Surgeon: Toney Reil, MD;  Location: [facility] ENDOSCOPY;  Service: Gastroenterology;  Laterality: N/A;    Family History  Problem Relation Age of Onset   Hypertension Mother    Lung cancer Mother    Throat cancer Father     Diabetes Sister    Lupus Maternal Grandmother    Stroke Maternal Grandfather    Sudden Cardiac Death Daughter    Breast cancer Paternal Aunt     Social History Reviewed with no changes to be made today.   Outpatient Medications Prior to Visit  Medication Sig Dispense Refill   atorvastatin (LIPITOR) 20 MG tablet Take 1 tablet (20 mg total) by mouth daily. 90 tablet 3   cetirizine (ZYRTEC ALLERGY) 10 MG tablet Take 1 tablet (10 mg total) by mouth at bedtime. 90 tablet 1   glucose blood (TRUE METRIX BLOOD GLUCOSE TEST) test strip Use as instructed 100 each 12   ibuprofen (ADVIL) 600 MG tablet Take 1 tablet (600 mg total) by mouth every 8 (eight) hours as needed. 60 tablet 1   insulin glargine (LANTUS SOLOSTAR) 100 UNIT/ML Solostar Pen Inject 20 Units into the skin 2 (two) times daily. 45 mL 0   insulin lispro (HUMALOG KWIKPEN) 100 UNIT/ML KwikPen Inject 5 Units into the skin 3 (three) times daily. 6 mL 2   metFORMIN (GLUCOPHAGE) 500 MG tablet Take 2 tablets (1,000 mg total) by mouth 2 (two) times daily with a meal. 360 tablet 1   TRUEplus Lancets 28G MISC Use as instructed. Check blood glucose level by fingerstick twice per day. 100 each 3   propranolol ER (INDERAL LA) 120 MG 24 hr capsule Take 1 capsule (120 mg total) by mouth daily. 4 capsule 0   alum & mag hydroxide-simeth (MAALOX MAX) 400-400-40 MG/5ML suspension Take 10 mLs by mouth every 6 (six) hours as needed for indigestion. (Patient not taking: Reported on 11/29/2022) 355 mL 0   EPINEPHrine 0.3 mg/0.3 mL IJ SOAJ injection Inject 0.3 mg into the muscle as needed for anaphylaxis. (Patient not taking: Reported on 11/29/2022) 2 each 1   Semaglutide, 2 MG/DOSE, 8 MG/3ML SOPN Inject 2 mg as directed once a week. (Patient not taking: Reported on 11/29/2022) 9 mL 1   No facility-administered medications prior to visit.    Allergies  Allergen Reactions   Ace Inhibitors Shortness Of Breath    Reported by the patient after a trial of  lisinopril prescribes by PCP   Aspirin Anaphylaxis   Rocephin [Ceftriaxone] Itching   Metronidazole Hives   Ciprofloxacin Itching   Prednisone Palpitations       Objective:    BP 139/82 (BP Location: Left Arm, Patient Position: Sitting, Cuff Size: Large)   Pulse 75   Ht 5\' 10"  (1.778 m)   Wt 206 lb 9.6 oz (93.7 kg)   LMP 11/15/2022 (Approximate)   SpO2 100%   BMI 29.64 kg/m  Wt Readings from Last 3 Encounters:  11/29/22 206 lb 9.6 oz (93.7 kg)  07/08/22 204 lb 1.6 oz (92.6 kg)  05/11/22 203 lb 14.4 oz (92.5 kg)    Physical Exam Vitals and nursing note reviewed.  Constitutional:      Appearance: She is well-developed.  HENT:     Head: Normocephalic and atraumatic.  Cardiovascular:     Rate and Rhythm:  Normal rate and regular rhythm.     Heart sounds: Normal heart sounds. No murmur heard.    No friction rub. No gallop.  Pulmonary:     Effort: Pulmonary effort is normal. No tachypnea or respiratory distress.     Breath sounds: Normal breath sounds. No decreased breath sounds, wheezing, rhonchi or rales.  Chest:     Chest wall: No tenderness.  Abdominal:     General: Bowel sounds are normal.     Palpations: Abdomen is soft.  Musculoskeletal:        General: Normal range of motion.     Cervical back: Normal range of motion.  Skin:    General: Skin is warm and dry.  Neurological:     Mental Status: She is alert and oriented to person, place, and time.     Coordination: Coordination normal.  Psychiatric:        Behavior: Behavior normal. Behavior is cooperative.        Thought Content: Thought content normal.        Judgment: Judgment normal.          Patient has been counseled extensively about nutrition and exercise as well as the importance of adherence with medications and regular follow-up. The patient was given clear instructions to go to ER or return to medical center if symptoms don't improve, worsen or new problems develop. The patient verbalized  understanding.   Follow-up: No follow-ups on file.   Claiborne Rigg, FNP-BC [facility] [place], [place] [phone]   11/29/2022, 2:13 PM

## 2022-11-30 ENCOUNTER — Other Ambulatory Visit: Payer: Self-pay

## 2022-11-30 LAB — CBC WITH DIFFERENTIAL/PLATELET
Basophils Absolute: 0 10*3/uL (ref 0.0–0.2)
Basos: 0 %
EOS (ABSOLUTE): 0.4 10*3/uL (ref 0.0–0.4)
Eos: 5 %
Hematocrit: 30.9 % — ABNORMAL LOW (ref 34.0–46.6)
Hemoglobin: 9.9 g/dL — ABNORMAL LOW (ref 11.1–15.9)
Immature Grans (Abs): 0 10*3/uL (ref 0.0–0.1)
Immature Granulocytes: 0 %
Lymphocytes Absolute: 2.9 10*3/uL (ref 0.7–3.1)
Lymphs: 34 %
MCH: 23.1 pg — ABNORMAL LOW (ref 26.6–33.0)
MCHC: 32 g/dL (ref 31.5–35.7)
MCV: 72 fL — ABNORMAL LOW (ref 79–97)
Monocytes Absolute: 1 10*3/uL — ABNORMAL HIGH (ref 0.1–0.9)
Monocytes: 12 %
Neutrophils Absolute: 4.2 10*3/uL (ref 1.4–7.0)
Neutrophils: 49 %
Platelets: 531 10*3/uL — ABNORMAL HIGH (ref 150–450)
RBC: 4.29 x10E6/uL (ref 3.77–5.28)
RDW: 15.1 % (ref 11.7–15.4)
WBC: 8.6 10*3/uL (ref 3.4–10.8)

## 2022-11-30 LAB — CMP14+EGFR
ALT: 22 IU/L (ref 0–32)
AST: 20 IU/L (ref 0–40)
Albumin: 4.2 g/dL (ref 3.9–4.9)
Alkaline Phosphatase: 63 IU/L (ref 44–121)
BUN/Creatinine Ratio: 12 (ref 9–23)
BUN: 9 mg/dL (ref 6–24)
Bilirubin Total: <0.2 mg/dL (ref 0.0–1.2)
CO2: 20 mmol/L (ref 20–29)
Calcium: 9.7 mg/dL (ref 8.7–10.2)
Chloride: 99 mmol/L (ref 96–106)
Creatinine, Ser: 0.76 mg/dL (ref 0.57–1.00)
Globulin, Total: 2.9 g/dL (ref 1.5–4.5)
Glucose: 163 mg/dL — ABNORMAL HIGH (ref 70–99)
Potassium: 4.7 mmol/L (ref 3.5–5.2)
Sodium: 136 mmol/L (ref 134–144)
Total Protein: 7.1 g/dL (ref 6.0–8.5)
eGFR: 97 mL/min/{1.73_m2} (ref >59)

## 2022-12-03 ENCOUNTER — Other Ambulatory Visit: Payer: Self-pay

## 2022-12-07 ENCOUNTER — Other Ambulatory Visit: Payer: Self-pay

## 2022-12-28 ENCOUNTER — Other Ambulatory Visit: Payer: Self-pay | Admitting: Family Medicine

## 2022-12-28 ENCOUNTER — Other Ambulatory Visit: Payer: Self-pay

## 2022-12-28 ENCOUNTER — Encounter: Payer: Self-pay | Admitting: Nurse Practitioner

## 2022-12-28 MED ORDER — INSULIN LISPRO (1 UNIT DIAL) 100 UNIT/ML (KWIKPEN)
5.0000 [IU] | PEN_INJECTOR | Freq: Three times a day (TID) | SUBCUTANEOUS | 2 refills | Status: DC
  Filled 2022-12-28 (×2): qty 6, 40d supply, fill #0
  Filled 2023-02-14 – 2023-02-15 (×2): qty 6, 40d supply, fill #1
  Filled 2023-05-01 – 2023-05-02 (×2): qty 6, 40d supply, fill #2

## 2022-12-28 NOTE — Telephone Encounter (Signed)
Requested Prescriptions  Pending Prescriptions Disp Refills   insulin lispro (HUMALOG KWIKPEN) 100 UNIT/ML KwikPen 6 mL 2    Sig: Inject 5 Units into the skin 3 (three) times daily.     Endocrinology:  Diabetes - Insulins Failed - 12/28/2022  9:04 AM      Failed - HBA1C is between 0 and 7.9 and within 180 days    Hemoglobin A1C  Date Value Ref Range Status  11/29/2022 9.2 (A) 4.0 - 5.6 % Final   HbA1c, POC (controlled diabetic range)  Date Value Ref Range Status  04/28/2022 7.8 (A) 0.0 - 7.0 % Final         Passed - Valid encounter within last 6 months    Recent Outpatient Visits           4 weeks ago Type 2 diabetes mellitus with hyperglycemia, with long-term current use of insulin [facility])   [facility] [place], [place] W, NP   8 months ago Type 2 diabetes mellitus with hyperglycemia, with long-term current use of insulin [facility])   [facility] [facility] [place], [place], [place]   11 months ago Type 2 diabetes mellitus with hyperglycemia, with long-term current use of insulin [facility])   [facility] [place], [place] W, NP   1 year ago Type 2 diabetes mellitus with hyperglycemia, with long-term current use of insulin [facility])   [facility] [place], [place] W, NP   1 year ago Type 2 diabetes mellitus with hyperglycemia, with long-term current use of insulin [facility])   [facility] [place], [place], [place]       Future Appointments             In 2 months Claiborne Rigg, NP [facility]

## 2023-01-12 ENCOUNTER — Other Ambulatory Visit: Payer: Self-pay

## 2023-01-21 ENCOUNTER — Other Ambulatory Visit: Payer: Self-pay

## 2023-02-15 ENCOUNTER — Other Ambulatory Visit (HOSPITAL_COMMUNITY): Payer: Self-pay

## 2023-02-15 ENCOUNTER — Other Ambulatory Visit: Payer: Self-pay

## 2023-02-16 ENCOUNTER — Other Ambulatory Visit: Payer: Self-pay

## 2023-02-16 ENCOUNTER — Other Ambulatory Visit (HOSPITAL_COMMUNITY): Payer: Self-pay

## 2023-02-21 ENCOUNTER — Other Ambulatory Visit: Payer: Self-pay

## 2023-02-28 ENCOUNTER — Ambulatory Visit: Payer: Self-pay | Admitting: Nurse Practitioner

## 2023-03-07 ENCOUNTER — Other Ambulatory Visit: Payer: Self-pay | Admitting: Nurse Practitioner

## 2023-03-07 ENCOUNTER — Encounter: Payer: Self-pay | Admitting: Nurse Practitioner

## 2023-03-07 MED ORDER — NITROFURANTOIN MONOHYD MACRO 100 MG PO CAPS: 100.0000 mg | ORAL_CAPSULE | Freq: Two times a day (BID) | ORAL | 0 refills | Status: AC

## 2023-03-07 NOTE — Telephone Encounter (Signed)
Macrobid sent

## 2023-03-28 ENCOUNTER — Other Ambulatory Visit: Payer: Self-pay | Admitting: Family Medicine

## 2023-03-28 ENCOUNTER — Other Ambulatory Visit: Payer: Self-pay

## 2023-03-28 MED ORDER — INSULIN GLARGINE 100 UNIT/ML SOLOSTAR PEN
20.0000 [IU] | PEN_INJECTOR | Freq: Two times a day (BID) | SUBCUTANEOUS | 0 refills | Status: DC
  Filled 2023-03-28: qty 15, 38d supply, fill #0

## 2023-03-29 ENCOUNTER — Other Ambulatory Visit: Payer: Self-pay

## 2023-03-30 ENCOUNTER — Other Ambulatory Visit: Payer: Self-pay

## 2023-04-04 ENCOUNTER — Other Ambulatory Visit: Payer: Self-pay

## 2023-04-12 ENCOUNTER — Telehealth: Payer: Self-pay | Admitting: Physician Assistant

## 2023-04-12 DIAGNOSIS — U071 COVID-19: Secondary | ICD-10-CM

## 2023-04-12 DIAGNOSIS — J208 Acute bronchitis due to other specified organisms: Secondary | ICD-10-CM

## 2023-04-12 MED ORDER — FLUTICASONE-SALMETEROL 100-50 MCG/ACT IN AEPB: 1.0000 | INHALATION_SPRAY | Freq: Two times a day (BID) | RESPIRATORY_TRACT | 0 refills | Status: DC

## 2023-04-12 MED ORDER — FLUTICASONE PROPIONATE HFA 110 MCG/ACT IN AERO: 1.0000 | INHALATION_SPRAY | Freq: Two times a day (BID) | RESPIRATORY_TRACT | 0 refills | Status: AC | PRN

## 2023-04-12 MED ORDER — ALBUTEROL SULFATE HFA 108 (90 BASE) MCG/ACT IN AERS: 1.0000 | INHALATION_SPRAY | Freq: Four times a day (QID) | RESPIRATORY_TRACT | 0 refills | Status: DC | PRN

## 2023-04-12 MED ORDER — IPRATROPIUM BROMIDE HFA 17 MCG/ACT IN AERS: 1.0000 | INHALATION_SPRAY | RESPIRATORY_TRACT | 0 refills | Status: AC | PRN

## 2023-04-12 NOTE — Progress Notes (Signed)
Virtual Visit Consent   Tonya Paul, you are scheduled for a virtual visit with a [facility] provider today. Just as with appointments in the office, your consent must be obtained to participate. Your consent will be active for this visit and any virtual visit you may have with one of our providers in the next 365 days. If you have a MyChart account, a copy of this consent can be sent to you electronically.  As this is a virtual visit, video technology does not allow for your provider to perform a traditional examination. This may limit your provider's ability to fully assess your condition. If your provider identifies any concerns that need to be evaluated in person or the need to arrange testing (such as labs, EKG, etc.), we will make arrangements to do so. Although advances in technology are sophisticated, we cannot ensure that it will always work on either your end or our end. If the connection with a video visit is poor, the visit may have to be switched to a telephone visit. With either a video or telephone visit, we are not always able to ensure that we have a secure connection.  By engaging in this virtual visit, you consent to the provision of healthcare and authorize for your insurance to be billed (if applicable) for the services provided during this visit. Depending on your insurance coverage, you may receive a charge related to this service.  I need to obtain your verbal consent now. Are you willing to proceed with your visit today? Tonya Paul has provided verbal consent on 04/12/2023 for a virtual visit (video or telephone). [name], PA-C  Date: 04/12/2023 10:04 AM  Virtual Visit via Video Note   I, [name], connected with  Tonya Paul  ([id number], [birth year]) on 04/12/23 at  9:45 AM EST by a video-enabled telemedicine application and verified that I am speaking with the correct person using two identifiers.  Location: Patient: Virtual Visit Location  Patient: Home Provider: Virtual Visit Location Provider: Home Office   I discussed the limitations of evaluation and management by telemedicine and the availability of in person appointments. The patient expressed understanding and agreed to proceed.    History of Present Illness: Tonya Paul is a 48 y.o. who identifies as a female who was assigned female at birth, and is being seen today for Covid 19 and shortness of breath.  HPI: URI  This is a new problem. The current episode started in the past 7 days (Went to ER on 04/08/23 and diagnosed with Covid 19; Left before being seen). The problem has been gradually improving. There has been no fever. Associated symptoms include chest pain (tightness and heavy with wheezing), congestion, coughing and wheezing (intermittent). Pertinent negatives include no diarrhea, ear pain, headaches, nausea, plugged ear sensation, rhinorrhea, sinus pain, sore throat or vomiting. Associated symptoms comments: Shortness of breath occurs with exertion. She has tried acetaminophen, NSAIDs and increased fluids (nyquil cold and flu, theraflu) for the symptoms. The treatment provided moderate relief.   No history of asthma PMH: Long QT  Problems:  Patient Active Problem List   Diagnosis Date Noted   Abnormal uterine bleeding 03/04/2022   Pain of left patellofemoral joint 09/10/2020   Elevated coronary artery calcium score 01/04/2020   Hyperlipidemia 01/04/2020   Essential hypertension 06/14/2019   Dizziness 06/14/2019   Abdominal pain, chronic, epigastric    GERD (gastroesophageal reflux disease) 06/20/2017   Long Q-T syndrome 01/14/2017   Palpitations 02/03/2016   Uterine  fibroid 12/11/2015   Family history of sudden cardiac death 12-24-2015   Left breast mass 09/01/2015   Obesity 09/01/2015   Insulin dependent type 2 diabetes mellitus (HCC) 04/16/2015    Allergies:  Allergies  Allergen Reactions   Ace Inhibitors Shortness Of Breath    Reported by the  patient after a trial of lisinopril prescribes by PCP   Aspirin Anaphylaxis   Rocephin [Ceftriaxone] Itching   Metronidazole Hives   Ciprofloxacin Itching   Prednisone Palpitations   Medications:  Current Outpatient Medications:    fluticasone (FLOVENT HFA) 110 MCG/ACT inhaler, Inhale 1 puff into the lungs every 12 (twelve) hours as needed., Disp: 1 each, Rfl: 0   ipratropium (ATROVENT HFA) 17 MCG/ACT inhaler, Inhale 1-2 puffs into the lungs every 4 (four) hours as needed for wheezing., Disp: 1 each, Rfl: 0   atorvastatin (LIPITOR) 20 MG tablet, Take 1 tablet (20 mg total) by mouth daily., Disp: 90 tablet, Rfl: 3   cetirizine (ZYRTEC ALLERGY) 10 MG tablet, Take 1 tablet (10 mg total) by mouth at bedtime., Disp: 90 tablet, Rfl: 1   EPINEPHrine 0.3 mg/0.3 mL IJ SOAJ injection, Inject 0.3 mg into the muscle as needed for anaphylaxis. (Patient not taking: Reported on 11/29/2022), Disp: 2 each, Rfl: 1   glipiZIDE (GLUCOTROL) 5 MG tablet, Take 0.5 tablets (2.5 mg total) by mouth 2 (two) times daily before a meal., Disp: 90 tablet, Rfl: 1   glucose blood (TRUE METRIX BLOOD GLUCOSE TEST) test strip, Use as instructed, Disp: 100 each, Rfl: 12   ibuprofen (ADVIL) 600 MG tablet, Take 1 tablet (600 mg total) by mouth every 8 (eight) hours as needed., Disp: 60 tablet, Rfl: 1   insulin glargine (LANTUS) 100 UNIT/ML Solostar Pen, Inject 20 Units into the skin 2 (two) times daily., Disp: 15 mL, Rfl: 0   insulin lispro (HUMALOG KWIKPEN) 100 UNIT/ML KwikPen, Inject 5 Units into the skin 3 (three) times daily., Disp: 6 mL, Rfl: 2   metFORMIN (GLUCOPHAGE) 500 MG tablet, Take 2 tablets (1,000 mg total) by mouth 2 (two) times daily with a meal., Disp: 360 tablet, Rfl: 1   propranolol ER (INDERAL LA) 160 MG SR capsule, Take 1 capsule (160 mg total) by mouth daily., Disp: 90 capsule, Rfl: 1   TRUEplus Lancets 28G MISC, Use as instructed. Check blood glucose level by fingerstick twice per day., Disp: 100 each, Rfl:  3  Observations/Objective: Patient is well-developed, well-nourished in no acute distress.  Resting comfortably at home.  Head is normocephalic, atraumatic.  No labored breathing. No audible adventitious breath sounds Speech is clear and coherent with logical content.  Patient is alert and oriented at baseline.    Assessment and Plan: 1. COVID-19 (Primary) - ipratropium (ATROVENT HFA) 17 MCG/ACT inhaler; Inhale 1-2 puffs into the lungs every 4 (four) hours as needed for wheezing.  Dispense: 1 each; Refill: 0 - fluticasone (FLOVENT HFA) 110 MCG/ACT inhaler; Inhale 1 puff into the lungs every 12 (twelve) hours as needed.  Dispense: 1 each; Refill: 0  2. Viral bronchitis - ipratropium (ATROVENT HFA) 17 MCG/ACT inhaler; Inhale 1-2 puffs into the lungs every 4 (four) hours as needed for wheezing.  Dispense: 1 each; Refill: 0 - fluticasone (FLOVENT HFA) 110 MCG/ACT inhaler; Inhale 1 puff into the lungs every 12 (twelve) hours as needed.  Dispense: 1 each; Refill: 0  - Symptoms overall improving but has residual chest tightness and wheezing - Suspect secondary viral bronchitis from Covid 19 - Add Ipratropium and Flovent (  Patient intolerant to oral prednisone due to palpitations); Will avoid SABA and LABA due to patient with Long QT syndrome - Strict ER precautions if symptoms worsen or if develops fever with shortness of breath - Seek in person evaluation if symptoms fail to improve over next 7 days   Follow Up Instructions: I discussed the assessment and treatment plan with the patient. The patient was provided an opportunity to ask questions and all were answered. The patient agreed with the plan and demonstrated an understanding of the instructions.  A copy of instructions were sent to the patient via MyChart unless otherwise noted below.    The patient was advised to call back or seek an in-person evaluation if the symptoms worsen or if the condition fails to improve as anticipated.     [name], PA-C

## 2023-04-12 NOTE — Patient Instructions (Signed)
Tonya Paul, thank you for joining [name], PA-C for today's virtual visit.  While this provider is not your primary care provider (PCP), if your PCP is located in our provider database this encounter information will be shared with them immediately following your visit.   A [facility] MyChart account gives you access to today's visit and all your visits, tests, and labs performed at [facility] " click here if you don't have a [facility] MyChart account or go to [email or web address]  Consent: (Patient) Tonya Paul provided verbal consent for this virtual visit at the beginning of the encounter.  Current Medications:  Current Outpatient Medications:    fluticasone (FLOVENT HFA) 110 MCG/ACT inhaler, Inhale 1 puff into the lungs every 12 (twelve) hours as needed., Disp: 1 each, Rfl: 0   ipratropium (ATROVENT HFA) 17 MCG/ACT inhaler, Inhale 1-2 puffs into the lungs every 4 (four) hours as needed for wheezing., Disp: 1 each, Rfl: 0   atorvastatin (LIPITOR) 20 MG tablet, Take 1 tablet (20 mg total) by mouth daily., Disp: 90 tablet, Rfl: 3   cetirizine (ZYRTEC ALLERGY) 10 MG tablet, Take 1 tablet (10 mg total) by mouth at bedtime., Disp: 90 tablet, Rfl: 1   EPINEPHrine 0.3 mg/0.3 mL IJ SOAJ injection, Inject 0.3 mg into the muscle as needed for anaphylaxis. (Patient not taking: Reported on 11/29/2022), Disp: 2 each, Rfl: 1   glipiZIDE (GLUCOTROL) 5 MG tablet, Take 0.5 tablets (2.5 mg total) by mouth 2 (two) times daily before a meal., Disp: 90 tablet, Rfl: 1   glucose blood (TRUE METRIX BLOOD GLUCOSE TEST) test strip, Use as instructed, Disp: 100 each, Rfl: 12   ibuprofen (ADVIL) 600 MG tablet, Take 1 tablet (600 mg total) by mouth every 8 (eight) hours as needed., Disp: 60 tablet, Rfl: 1   insulin glargine (LANTUS) 100 UNIT/ML Solostar Pen, Inject 20 Units into the skin 2 (two) times daily., Disp: 15 mL, Rfl: 0   insulin lispro (HUMALOG KWIKPEN) 100 UNIT/ML KwikPen,  Inject 5 Units into the skin 3 (three) times daily., Disp: 6 mL, Rfl: 2   metFORMIN (GLUCOPHAGE) 500 MG tablet, Take 2 tablets (1,000 mg total) by mouth 2 (two) times daily with a meal., Disp: 360 tablet, Rfl: 1   propranolol ER (INDERAL LA) 160 MG SR capsule, Take 1 capsule (160 mg total) by mouth daily., Disp: 90 capsule, Rfl: 1   TRUEplus Lancets 28G MISC, Use as instructed. Check blood glucose level by fingerstick twice per day., Disp: 100 each, Rfl: 3   Medications ordered in this encounter:  Meds ordered this encounter  Medications   DISCONTD: albuterol (VENTOLIN HFA) 108 (90 Base) MCG/ACT inhaler    Sig: Inhale 1-2 puffs into the lungs every 6 (six) hours as needed.    Dispense:  8 g    Refill:  0    Supervising Provider:   Merrilee Jansky [[id number]]   DISCONTD: fluticasone-salmeterol (WIXELA INHUB) 100-50 MCG/ACT AEPB    Sig: Inhale 1 puff into the lungs 2 (two) times daily.    Dispense:  60 each    Refill:  0    Supervising Provider:   Merrilee Jansky [[id number]]   ipratropium (ATROVENT HFA) 17 MCG/ACT inhaler    Sig: Inhale 1-2 puffs into the lungs every 4 (four) hours as needed for wheezing.    Dispense:  1 each    Refill:  0    Use instead of Albuterol due to Long QT    Supervising Provider:  LAMPTEY, PHILIP O [[id number]]   fluticasone (FLOVENT HFA) 110 MCG/ACT inhaler    Sig: Inhale 1 puff into the lungs every 12 (twelve) hours as needed.    Dispense:  1 each    Refill:  0    Fill instead of [place]a due to Long QT    Supervising Provider:   Merrilee Jansky [[id number]]     *If you need refills on other medications prior to your next appointment, please contact your pharmacy*  Follow-Up: Call back or seek an in-person evaluation if the symptoms worsen or if the condition fails to improve as anticipated.  [facility] [phone]  Other Instructions  Acute Bronchitis, Adult  Acute bronchitis is sudden inflammation of the main airways (bronchi) that  come off the windpipe (trachea) in the lungs. The swelling causes the airways to get smaller and make more mucus than normal. This can make it hard to breathe and can cause coughing or noisy breathing (wheezing). Acute bronchitis may last several weeks. The cough may last longer. Allergies, asthma, and exposure to smoke may make the condition worse. What are the causes? This condition can be caused by germs and by substances that irritate the lungs, including: Cold and flu viruses. The most common cause of this condition is the virus that causes the common cold. Bacteria. This is less common. Breathing in substances that irritate the lungs, including: Smoke from cigarettes and other forms of tobacco. Dust and pollen. Fumes from household cleaning products, gases, or burned fuel. Indoor or outdoor air pollution. What increases the risk? The following factors may make you more likely to develop this condition: A weak body's defense system, also called the immune system. A condition that affects your lungs and breathing, such as asthma. What are the signs or symptoms? Common symptoms of this condition include: Coughing. This may bring up clear, yellow, or green mucus from your lungs (sputum). Wheezing. Runny or stuffy nose. Having too much mucus in your lungs (chest congestion). Shortness of breath. Aches and pains, including sore throat or chest. How is this diagnosed? This condition is usually diagnosed based on: Your symptoms and medical history. A physical exam. You may also have other tests, including tests to rule out other conditions, such as pneumonia. These tests include: A test of lung function. Test of a mucus sample to look for the presence of bacteria. Tests to check the oxygen level in your blood. Blood tests. Chest X-ray. How is this treated? Most cases of acute bronchitis clear up over time without treatment. Your health care provider may recommend: Drinking more  fluids to help thin your mucus so it is easier to cough up. Taking inhaled medicine (inhaler) to improve air flow in and out of your lungs. Using a vaporizer or a humidifier. These are machines that add water to the air to help you breathe better. Taking a medicine that thins mucus and clears congestion (expectorant). Taking a medicine that prevents or stops coughing (cough suppressant). It is not common to take an antibiotic medicine for this condition. Follow these instructions at home:  Take over-the-counter and prescription medicines only as told by your health care provider. Use an inhaler, vaporizer, or humidifier as told by your health care provider. Take two teaspoons (10 mL) of honey at bedtime to lessen coughing at night. Drink enough fluid to keep your urine pale yellow. Do not use any products that contain nicotine or tobacco. These products include cigarettes, chewing tobacco, and vaping devices,  such as e-cigarettes. If you need help quitting, ask your health care provider. Get plenty of rest. Return to your normal activities as told by your health care provider. Ask your health care provider what activities are safe for you. Keep all follow-up visits. This is important. How is this prevented? To lower your risk of getting this condition again: Wash your hands often with soap and water for at least 20 seconds. If soap and water are not available, use hand sanitizer. Avoid contact with people who have cold symptoms. Try not to touch your mouth, nose, or eyes with your hands. Avoid breathing in smoke or chemical fumes. Breathing smoke or chemical fumes will make your condition worse. Get the flu shot every year. Contact a health care provider if: Your symptoms do not improve after 2 weeks. You have trouble coughing up the mucus. Your cough keeps you awake at night. You have a fever. Get help right away if you: Cough up blood. Feel pain in your chest. Have severe shortness  of breath. Faint or keep feeling like you are going to faint. Have a severe headache. Have a fever or chills that get worse. These symptoms may represent a serious problem that is an emergency. Do not wait to see if the symptoms will go away. Get medical help right away. Call your local emergency services (911 in the [place] Do not drive yourself to the hospital. Summary Acute bronchitis is inflammation of the main airways (bronchi) that come off the windpipe (trachea) in the lungs. The swelling causes the airways to get smaller and make more mucus than normal. Drinking more fluids can help thin your mucus so it is easier to cough up. Take over-the-counter and prescription medicines only as told by your health care provider. Do not use any products that contain nicotine or tobacco. These products include cigarettes, chewing tobacco, and vaping devices, such as e-cigarettes. If you need help quitting, ask your health care provider. Contact a health care provider if your symptoms do not improve after 2 weeks. This information is not intended to replace advice given to you by your health care provider. Make sure you discuss any questions you have with your health care provider. Document Revised: 06/18/2021 Document Reviewed: 07/09/2020 Elsevier Patient Education  2024 Elsevier Inc.   If you have been instructed to have an in-person evaluation today at a local Urgent Care facility, please use the link below. It will take you to a list of all of our available [facility], including address, phone number and hours of operation. Please do not delay care.  [facility]  If you or a family member do not have a primary care provider, use the link below to schedule a visit and establish care. When you choose a [facility] primary care physician or advanced practice provider, you gain a long-term partner in health. Find a Primary Care Provider  Learn more about [facility]'s  in-office and virtual care options: [facility] - Get Care Now

## 2023-05-01 ENCOUNTER — Other Ambulatory Visit: Payer: Self-pay | Admitting: Family Medicine

## 2023-05-01 ENCOUNTER — Other Ambulatory Visit (HOSPITAL_COMMUNITY): Payer: Self-pay

## 2023-05-01 ENCOUNTER — Other Ambulatory Visit: Payer: Self-pay | Admitting: Physician Assistant

## 2023-05-01 ENCOUNTER — Other Ambulatory Visit: Payer: Self-pay

## 2023-05-01 DIAGNOSIS — E1165 Type 2 diabetes mellitus with hyperglycemia: Secondary | ICD-10-CM

## 2023-05-01 DIAGNOSIS — E785 Hyperlipidemia, unspecified: Secondary | ICD-10-CM

## 2023-05-02 ENCOUNTER — Other Ambulatory Visit (HOSPITAL_COMMUNITY): Payer: Self-pay

## 2023-05-02 ENCOUNTER — Other Ambulatory Visit: Payer: Self-pay

## 2023-05-02 NOTE — Telephone Encounter (Signed)
 Requested medication (s) are due for refill today: Yes  Requested medication (s) are on the active medication list: Yes  Last refill:  03/28/23  Future visit scheduled: No  Notes to clinic:  Unable to refill per protocol, courtesy refill already given, routing for provider approval.      Requested Prescriptions  Pending Prescriptions Disp Refills   insulin  glargine (LANTUS ) 100 UNIT/ML Solostar Pen 15 mL 0    Sig: Inject 20 Units into the skin 2 (two) times daily.     Endocrinology:  Diabetes - Insulins Failed - 05/02/2023  3:42 PM      Failed - HBA1C is between 0 and 7.9 and within 180 days    Hemoglobin A1C  Date Value Ref Range Status  11/29/2022 9.2 (A) 4.0 - 5.6 % Final   HbA1c, POC (controlled diabetic range)  Date Value Ref Range Status  04/28/2022 7.8 (A) 0.0 - 7.0 % Final         Passed - Valid encounter within last 6 months    Recent Outpatient Visits           5 months ago Type 2 diabetes mellitus with hyperglycemia, with long-term current use of insulin  (HCC)   [facility] [place]. [facility] [place], [place] W, NP   1 year ago Type 2 diabetes mellitus with hyperglycemia, with long-term current use of insulin  [facility])   [facility] - [facility] Dept Of [place]. [facility] [place], [place], [place]   1 year ago Type 2 diabetes mellitus with hyperglycemia, with long-term current use of insulin  [facility])   [facility] - A Dept Of [place]. [facility] Winda Hastings W, NP   1 year ago Type 2 diabetes mellitus with hyperglycemia, with long-term current use of insulin  [facility])   [facility] - A Dept Of [place]. [facility] [place], [place] W, NP   1 year ago Type 2 diabetes mellitus with hyperglycemia, with long-term current use of insulin  [facility])   [facility] - A Dept Of [place]. [facility] [place], [place], PA-C

## 2023-05-02 NOTE — Telephone Encounter (Signed)
 Requested medication (s) are due for refill today: Yes  Requested medication (s) are on the active medication list: Yes  Last refill:  04/28/22  Future visit scheduled: No  Notes to clinic:  Unable to refill per protocol due to failed labs, no updated results. Unable to refill per protocol, Rx expired.      Requested Prescriptions  Pending Prescriptions Disp Refills   atorvastatin  (LIPITOR) 20 MG tablet 90 tablet 3    Sig: Take 1 tablet (20 mg total) by mouth daily.     Cardiovascular:  Antilipid - Statins Failed - 05/02/2023  3:53 PM      Failed - Lipid Panel in normal range within the last 12 months    Cholesterol, Total  Date Value Ref Range Status  05/20/2021 149 100 - 199 mg/dL Final   LDL Chol Calc (NIH)  Date Value Ref Range Status  05/20/2021 97 0 - 99 mg/dL Final   HDL  Date Value Ref Range Status  05/20/2021 38 (L) >39 mg/dL Final   Triglycerides  Date Value Ref Range Status  05/20/2021 68 0 - 149 mg/dL Final         Passed - Patient is not pregnant      Passed - Valid encounter within last 12 months    Recent Outpatient Visits           5 months ago Type 2 diabetes mellitus with hyperglycemia, with long-term current use of insulin  (HCC)   [facility] Of [place]. [facility] [place], [place] W, NP   1 year ago Type 2 diabetes mellitus with hyperglycemia, with long-term current use of insulin  [facility])   [facility] A Dept Of [place]. [facility] [place], [place], [place]   1 year ago Type 2 diabetes mellitus with hyperglycemia, with long-term current use of insulin  [facility])   [facility] - A Dept Of [place]. [facility] [place], [place] W, NP   1 year ago Type 2 diabetes mellitus with hyperglycemia, with long-term current use of insulin  [facility])   [facility] - A Dept Of [place]. [facility] [place], [place] W, NP   1 year ago Type 2 diabetes  mellitus with hyperglycemia, with long-term current use of insulin  [facility])   [facility] - A Dept Of [place]. [facility], Shelvy Dickens M, PA-C               metFORMIN  (GLUCOPHAGE ) 500 MG tablet 360 tablet 1    Sig: Take 2 tablets (1,000 mg total) by mouth 2 (two) times daily with a meal.     Endocrinology:  Diabetes - Biguanides Failed - 05/02/2023  3:53 PM      Failed - HBA1C is between 0 and 7.9 and within 180 days    Hemoglobin A1C  Date Value Ref Range Status  11/29/2022 9.2 (A) 4.0 - 5.6 % Final   HbA1c, POC (controlled diabetic range)  Date Value Ref Range Status  04/28/2022 7.8 (A) 0.0 - 7.0 % Final         Failed - B12 Level in normal range and within 720 days    No results found for: "VITAMINB12"       Passed - Cr in normal range and within 360 days    Creat  Date Value Ref Range Status  04/18/2015 0.84 0.50 - 1.10 mg/dL  Final   Creatinine, Ser  Date Value Ref Range Status  11/29/2022 0.76 0.57 - 1.00 mg/dL Final   Creatinine, POC  Date Value Ref Range Status  07/15/2016 200 mg/dL Final   Creatinine, Urine  Date Value Ref Range Status  04/18/2015 193 20 - 320 mg/dL Final         Passed - eGFR in normal range and within 360 days    GFR, Est African American  Date Value Ref Range Status  04/18/2015 >89 >=60 mL/min Final   GFR calc Af Amer  Date Value Ref Range Status  12/13/2019 >60 >60 mL/min Final   GFR, Est Non African American  Date Value Ref Range Status  04/18/2015 88 >=60 mL/min Final    Comment:      The estimated GFR is a calculation valid for adults (>=43 years old) that uses the CKD-EPI algorithm to adjust for age and sex. It is   not to be used for children, pregnant women, hospitalized patients,    patients on dialysis, or with rapidly changing kidney function. According to the NKDEP, eGFR >89 is normal, 60-89 shows mild impairment, 30-59 shows moderate impairment, 15-29 shows severe impairment and <15  is ESRD.      GFR, Estimated  Date Value Ref Range Status  04/01/2022 >60 >60 mL/min Final    Comment:    (NOTE) Calculated using the CKD-EPI Creatinine Equation (2021)    eGFR  Date Value Ref Range Status  11/29/2022 97 >59 mL/min/1.73 Final         Passed - Valid encounter within last 6 months    Recent Outpatient Visits           5 months ago Type 2 diabetes mellitus with hyperglycemia, with long-term current use of insulin  (HCC)   [facility] [place]. [facility] [place], [place] W, NP   1 year ago Type 2 diabetes mellitus with hyperglycemia, with long-term current use of insulin  [facility])   [facility] Dept Of [place]. [facility] [place], [place], [place]   1 year ago Type 2 diabetes mellitus with hyperglycemia, with long-term current use of insulin  [facility])   [facility] - A Dept Of [place]. [facility] [place], [place] W, NP   1 year ago Type 2 diabetes mellitus with hyperglycemia, with long-term current use of insulin  [facility])   [facility] - A Dept Of [place]. [facility] Winda Hastings W, NP   1 year ago Type 2 diabetes mellitus with hyperglycemia, with long-term current use of insulin  [facility])   [facility] - A Dept Of . [facility], Shelvy Dickens M, [place]              Passed - CBC within normal limits and completed in the last 12 months    WBC  Date Value Ref Range Status  11/29/2022 8.6 3.4 - 10.8 x10E3/uL Final  04/01/2022 8.0 4.0 - 10.5 K/uL Final   RBC  Date Value Ref Range Status  11/29/2022 4.29 3.77 - 5.28 x10E6/uL Final  04/01/2022 4.10 3.87 - 5.11 MIL/uL Final   Hemoglobin  Date Value Ref Range Status  11/29/2022 9.9 (L) 11.1 - 15.9 g/dL Final   Hematocrit  Date Value Ref Range Status  11/29/2022 30.9 (L) 34.0 - 46.6 % Final   MCHC  Date Value Ref Range  Status  11/29/2022 32.0 31.5  - 35.7 g/dL Final  40/98/1191 47.8 30.0 - 36.0 g/dL Final   [facility]  Date Value Ref Range Status  11/29/2022 23.1 (L) 26.6 - 33.0 pg Final  04/01/2022 22.9 (L) 26.0 - 34.0 pg Final   MCV  Date Value Ref Range Status  11/29/2022 72 (L) 79 - 97 fL Final   No results found for: "PLTCOUNTKUC", "LABPLAT", "POCPLA" RDW  Date Value Ref Range Status  11/29/2022 15.1 11.7 - 15.4 % Final

## 2023-05-03 ENCOUNTER — Other Ambulatory Visit: Payer: Self-pay

## 2023-05-04 ENCOUNTER — Other Ambulatory Visit: Payer: Self-pay

## 2023-05-04 ENCOUNTER — Other Ambulatory Visit (HOSPITAL_COMMUNITY): Payer: Self-pay

## 2023-05-04 MED ORDER — ATORVASTATIN CALCIUM 20 MG PO TABS
20.0000 mg | ORAL_TABLET | Freq: Every day | ORAL | 0 refills | Status: DC
  Filled 2023-05-04: qty 30, 30d supply, fill #0

## 2023-05-04 MED ORDER — METFORMIN HCL 500 MG PO TABS
1000.0000 mg | ORAL_TABLET | Freq: Two times a day (BID) | ORAL | 0 refills | Status: DC
  Filled 2023-05-04: qty 120, 30d supply, fill #0

## 2023-05-05 ENCOUNTER — Other Ambulatory Visit: Payer: Self-pay

## 2023-05-06 ENCOUNTER — Other Ambulatory Visit (HOSPITAL_COMMUNITY): Payer: Self-pay

## 2023-05-06 MED ORDER — INSULIN GLARGINE 100 UNIT/ML SOLOSTAR PEN
20.0000 [IU] | PEN_INJECTOR | Freq: Two times a day (BID) | SUBCUTANEOUS | 0 refills | Status: DC
  Filled 2023-05-06 – 2023-05-17 (×4): qty 15, 38d supply, fill #0

## 2023-05-16 ENCOUNTER — Other Ambulatory Visit (HOSPITAL_COMMUNITY): Payer: Self-pay

## 2023-05-16 ENCOUNTER — Other Ambulatory Visit: Payer: Self-pay

## 2023-05-17 ENCOUNTER — Other Ambulatory Visit: Payer: Self-pay

## 2023-05-17 ENCOUNTER — Other Ambulatory Visit (HOSPITAL_COMMUNITY): Payer: Self-pay

## 2023-06-13 ENCOUNTER — Other Ambulatory Visit: Payer: Self-pay | Admitting: Obstetrics and Gynecology

## 2023-06-13 DIAGNOSIS — Z1231 Encounter for screening mammogram for malignant neoplasm of breast: Secondary | ICD-10-CM

## 2023-06-14 ENCOUNTER — Ambulatory Visit: Payer: Self-pay | Admitting: Nurse Practitioner

## 2023-06-14 ENCOUNTER — Other Ambulatory Visit: Payer: Self-pay | Admitting: Nurse Practitioner

## 2023-06-14 DIAGNOSIS — I10 Essential (primary) hypertension: Secondary | ICD-10-CM

## 2023-06-14 DIAGNOSIS — E1165 Type 2 diabetes mellitus with hyperglycemia: Secondary | ICD-10-CM

## 2023-06-14 NOTE — Telephone Encounter (Unsigned)
 Copied from CRM [id number]. Topic: Clinical - Medication Refill >> Jun 14, 2023 10:03 AM Higinio Roger wrote: Most Recent Primary Care Visit:  Provider: Bertram Denver W  [facility]  Visit Type: OFFICE VISIT  Date: 11/29/2022  Medication: insulin glargine (LANTUS) 100 UNIT/ML Solostar Pen  propranolol ER (INDERAL LA) 160 MG SR capsule  metFORMIN (GLUCOPHAGE) 500 MG tablet  glipiZIDE (GLUCOTROL) 5 MG tablet   Has the patient contacted their pharmacy? Yes (Agent: If no, request that the patient contact the pharmacy for the refill. If patient does not wish to contact the pharmacy document the reason why and proceed with request.) (Agent: If yes, when and what did the pharmacy advise?)  Is this the correct pharmacy for this prescription? Yes If no, delete pharmacy and type the correct one.  This is the patient's preferred pharmacy:   [facility] [address], [address] [place] [address] Phone: [phone] Fax: [phone]  Has the prescription been filled recently? Yes  Is the patient out of the medication? Yes  Has the patient been seen for an appointment in the last year OR does the patient have an upcoming appointment? Yes  Can we respond through MyChart? Yes  Agent: Please be advised that Rx refills may take up to 3 business days. We ask that you follow-up with your pharmacy.

## 2023-06-15 ENCOUNTER — Other Ambulatory Visit: Payer: Self-pay

## 2023-06-15 MED ORDER — INSULIN GLARGINE 100 UNIT/ML SOLOSTAR PEN
20.0000 [IU] | PEN_INJECTOR | Freq: Two times a day (BID) | SUBCUTANEOUS | 0 refills | Status: DC
  Filled 2023-06-15: qty 12, 30d supply, fill #0
  Filled 2023-08-11 (×2): qty 3, 8d supply, fill #1

## 2023-06-15 MED ORDER — METFORMIN HCL 500 MG PO TABS
1000.0000 mg | ORAL_TABLET | Freq: Two times a day (BID) | ORAL | 0 refills | Status: DC
  Filled 2023-06-15: qty 120, 30d supply, fill #0

## 2023-06-15 MED ORDER — PROPRANOLOL HCL ER 160 MG PO CP24
160.0000 mg | ORAL_CAPSULE | Freq: Every day | ORAL | 1 refills | Status: DC
  Filled 2023-06-15: qty 90, 90d supply, fill #0
  Filled 2023-10-07 (×2): qty 90, 90d supply, fill #1

## 2023-06-15 MED ORDER — GLIPIZIDE 5 MG PO TABS
2.5000 mg | ORAL_TABLET | Freq: Two times a day (BID) | ORAL | 1 refills | Status: DC
  Filled 2023-06-15 – 2023-08-11 (×2): qty 90, 90d supply, fill #0
  Filled 2023-12-04 – 2023-12-05 (×3): qty 90, 90d supply, fill #1

## 2023-06-15 NOTE — Telephone Encounter (Signed)
 Requested Prescriptions  Pending Prescriptions Disp Refills   insulin glargine (LANTUS) 100 UNIT/ML Solostar Pen 15 mL 0    Sig: Inject 20 Units into the skin 2 (two) times daily.     Endocrinology:  Diabetes - Insulins Failed - 06/15/2023  2:50 PM      Failed - HBA1C is between 0 and 7.9 and within 180 days    Hemoglobin A1C  Date Value Ref Range Status  11/29/2022 9.2 (A) 4.0 - 5.6 % Final   HbA1c, POC (controlled diabetic range)  Date Value Ref Range Status  04/28/2022 7.8 (A) 0.0 - 7.0 % Final         Failed - Valid encounter within last 6 months    Recent Outpatient Visits           6 months ago Type 2 diabetes mellitus with hyperglycemia, with long-term current use of insulin (HCC)   [facility] Dept Of [place]. [facility] [place], [place] W, NP   1 year ago Type 2 diabetes mellitus with hyperglycemia, with long-term current use of insulin (HCC)   [facility] Dept Of [place]. [facility] [place], [place], [place]   1 year ago Type 2 diabetes mellitus with hyperglycemia, with long-term current use of insulin [facility])   [facility] - A Dept Of [place]. [facility] [place], [place] W, NP   1 year ago Type 2 diabetes mellitus with hyperglycemia, with long-term current use of insulin (HCC)   [facility] - A Dept Of [place]. [facility] [place], [place] W, NP   2 years ago Type 2 diabetes mellitus with hyperglycemia, with long-term current use of insulin (HCC)   [facility] - [facility] Dept Of [place]. [facility], Marylene Land M, [place]               propranolol ER (INDERAL LA) 160 MG SR capsule 90 capsule 1    Sig: Take 1 capsule (160 mg total) by mouth daily.     Cardiovascular:  Beta Blockers Failed - 06/15/2023  2:50 PM      Failed - Valid encounter within last 6 months    Recent Outpatient Visits           6 months ago Type  2 diabetes mellitus with hyperglycemia, with long-term current use of insulin (HCC)   [facility] Dept Of [place]. [facility] [place], [place] W, NP   1 year ago Type 2 diabetes mellitus with hyperglycemia, with long-term current use of insulin (HCC)   [facility] - A Dept Of [place]. [facility] [place], [place], [place]   1 year ago Type 2 diabetes mellitus with hyperglycemia, with long-term current use of insulin [facility])   [facility] - A Dept Of [place]. [facility] [place], [place] W, NP   1 year ago Type 2 diabetes mellitus with hyperglycemia, with long-term current use of insulin (HCC)   [facility] - A Dept Of [place]. [facility] [place], [place] W, NP   2 years ago Type 2 diabetes mellitus with hyperglycemia, with long-term current use of insulin (HCC)   [facility] - A Dept Of [place]. [facility] [place], [place], [place]  Passed - Last BP in normal range    BP Readings from Last 1 Encounters:  11/29/22 139/82         Passed - Last Heart Rate in normal range    Pulse Readings from Last 1 Encounters:  11/29/22 75          metFORMIN (GLUCOPHAGE) 500 MG tablet 120 tablet 0    Sig: Take 2 tablets (1,000 mg total) by mouth 2 (two) times daily with a meal.     Endocrinology:  Diabetes - Biguanides Failed - 06/15/2023  2:50 PM      Failed - HBA1C is between 0 and 7.9 and within 180 days    Hemoglobin A1C  Date Value Ref Range Status  11/29/2022 9.2 (A) 4.0 - 5.6 % Final   HbA1c, POC (controlled diabetic range)  Date Value Ref Range Status  04/28/2022 7.8 (A) 0.0 - 7.0 % Final         Failed - B12 Level in normal range and within 720 days    No results found for: "VITAMINB12"       Failed - Valid encounter within last 6 months    Recent Outpatient Visits           6 months ago Type 2 diabetes mellitus with  hyperglycemia, with long-term current use of insulin (HCC)   [facility] Dept Of [place]. [facility] [place], [place] W, NP   1 year ago Type 2 diabetes mellitus with hyperglycemia, with long-term current use of insulin (HCC)   [facility] - A Dept Of [place]. [facility] [place], [place], [place]   1 year ago Type 2 diabetes mellitus with hyperglycemia, with long-term current use of insulin [facility])   [facility] - A Dept Of [place]. [facility] [place], [place] W, NP   1 year ago Type 2 diabetes mellitus with hyperglycemia, with long-term current use of insulin (HCC)   [facility] - A Dept Of [place]. [facility] [place], [place] W, NP   2 years ago Type 2 diabetes mellitus with hyperglycemia, with long-term current use of insulin (HCC)   [facility] - A Dept Of [place]. [facility], [place] Land M, [place]              Passed - Cr in normal range and within 360 days    Creat  Date Value Ref Range Status  04/18/2015 0.84 0.50 - 1.10 mg/dL Final   Creatinine, Ser  Date Value Ref Range Status  11/29/2022 0.76 0.57 - 1.00 mg/dL Final   Creatinine, POC  Date Value Ref Range Status  07/15/2016 200 mg/dL Final   Creatinine, Urine  Date Value Ref Range Status  04/18/2015 193 20 - 320 mg/dL Final         Passed - eGFR in normal range and within 360 days    GFR, Est African American  Date Value Ref Range Status  04/18/2015 >89 >=60 mL/min Final   GFR calc Af Amer  Date Value Ref Range Status  12/13/2019 >60 >60 mL/min Final   GFR, Est Non African American  Date Value Ref Range Status  04/18/2015 88 >=60 mL/min Final    Comment:      The estimated GFR is a calculation valid for adults (>=47 years old) that uses the CKD-EPI algorithm to adjust for age and  sex. It is   not to be used for children, pregnant women, hospitalized  patients,    patients on dialysis, or with rapidly changing kidney function. According to the NKDEP, eGFR >89 is normal, 60-89 shows mild impairment, 30-59 shows moderate impairment, 15-29 shows severe impairment and <15 is ESRD.      GFR, Estimated  Date Value Ref Range Status  04/01/2022 >60 >60 mL/min Final    Comment:    (NOTE) Calculated using the CKD-EPI Creatinine Equation (2021)    eGFR  Date Value Ref Range Status  11/29/2022 97 >59 mL/min/1.73 Final         Passed - CBC within normal limits and completed in the last 12 months    WBC  Date Value Ref Range Status  11/29/2022 8.6 3.4 - 10.8 x10E3/uL Final  04/01/2022 8.0 4.0 - 10.5 K/uL Final   RBC  Date Value Ref Range Status  11/29/2022 4.29 3.77 - 5.28 x10E6/uL Final  04/01/2022 4.10 3.87 - 5.11 MIL/uL Final   Hemoglobin  Date Value Ref Range Status  11/29/2022 9.9 (L) 11.1 - 15.9 g/dL Final   Hematocrit  Date Value Ref Range Status  11/29/2022 30.9 (L) 34.0 - 46.6 % Final   MCHC  Date Value Ref Range Status  11/29/2022 32.0 31.5 - 35.7 g/dL Final  16/12/9602 54.0 30.0 - 36.0 g/dL Final   [facility]  Date Value Ref Range Status  11/29/2022 23.1 (L) 26.6 - 33.0 pg Final  04/01/2022 22.9 (L) 26.0 - 34.0 pg Final   MCV  Date Value Ref Range Status  11/29/2022 72 (L) 79 - 97 fL Final   No results found for: "PLTCOUNTKUC", "LABPLAT", "POCPLA" RDW  Date Value Ref Range Status  11/29/2022 15.1 11.7 - 15.4 % Final          glipiZIDE (GLUCOTROL) 5 MG tablet 90 tablet 1    Sig: Take 0.5 tablets (2.5 mg total) by mouth 2 (two) times daily before a meal.     Endocrinology:  Diabetes - Sulfonylureas Failed - 06/15/2023  2:50 PM      Failed - HBA1C is between 0 and 7.9 and within 180 days    Hemoglobin A1C  Date Value Ref Range Status  11/29/2022 9.2 (A) 4.0 - 5.6 % Final   HbA1c, POC (controlled diabetic range)  Date Value Ref Range Status  04/28/2022 7.8 (A) 0.0 - 7.0 % Final         Failed - Valid  encounter within last 6 months    Recent Outpatient Visits           6 months ago Type 2 diabetes mellitus with hyperglycemia, with long-term current use of insulin (HCC)   [facility] Dept Of [place]. [facility] [place], [place] W, NP   1 year ago Type 2 diabetes mellitus with hyperglycemia, with long-term current use of insulin (HCC)   [facility] Dept Of [place]. [facility] [place], [place], [place]   1 year ago Type 2 diabetes mellitus with hyperglycemia, with long-term current use of insulin [facility])   [facility] [facility] Dept Of [place]. [facility] [place], [place] W, NP   1 year ago Type 2 diabetes mellitus with hyperglycemia, with long-term current use of insulin (HCC)    [facility] A Dept Of . [facility] [place], NP   2  years ago Type 2 diabetes mellitus with hyperglycemia, with long-term current use of insulin (HCC)   [facility] Dept Of [place]. [facility], [place], [place]              Passed - Cr in normal range and within 360 days    Creat  Date Value Ref Range Status  04/18/2015 0.84 0.50 - 1.10 mg/dL Final   Creatinine, Ser  Date Value Ref Range Status  11/29/2022 0.76 0.57 - 1.00 mg/dL Final   Creatinine, POC  Date Value Ref Range Status  07/15/2016 200 mg/dL Final   Creatinine, Urine  Date Value Ref Range Status  04/18/2015 193 20 - 320 mg/dL Final

## 2023-06-16 ENCOUNTER — Other Ambulatory Visit: Payer: Self-pay

## 2023-06-29 ENCOUNTER — Ambulatory Visit: Payer: Self-pay | Admitting: Nurse Practitioner

## 2023-07-09 ENCOUNTER — Other Ambulatory Visit (HOSPITAL_COMMUNITY): Payer: Self-pay

## 2023-07-21 ENCOUNTER — Ambulatory Visit: Payer: Self-pay

## 2023-07-22 ENCOUNTER — Ambulatory Visit: Payer: Self-pay | Admitting: Nurse Practitioner

## 2023-07-22 ENCOUNTER — Encounter: Payer: Self-pay | Admitting: Nurse Practitioner

## 2023-07-22 NOTE — Telephone Encounter (Signed)
 Copied from CRM [id number]. Topic: General - Other >> Jul 22, 2023  1:03 PM Marissa P wrote: Reason for CRM: Patient would like provider to know she's out of refills

## 2023-08-11 ENCOUNTER — Other Ambulatory Visit: Payer: Self-pay | Admitting: Nurse Practitioner

## 2023-08-11 ENCOUNTER — Other Ambulatory Visit: Payer: Self-pay

## 2023-08-11 DIAGNOSIS — E1165 Type 2 diabetes mellitus with hyperglycemia: Secondary | ICD-10-CM

## 2023-08-11 DIAGNOSIS — E785 Hyperlipidemia, unspecified: Secondary | ICD-10-CM

## 2023-08-12 ENCOUNTER — Encounter: Payer: Self-pay | Admitting: Pharmacist

## 2023-08-12 ENCOUNTER — Ambulatory Visit: Payer: Self-pay | Admitting: Nurse Practitioner

## 2023-08-12 ENCOUNTER — Encounter: Payer: Self-pay | Admitting: Nurse Practitioner

## 2023-08-12 ENCOUNTER — Other Ambulatory Visit: Payer: Self-pay

## 2023-09-02 ENCOUNTER — Ambulatory Visit: Payer: Self-pay | Admitting: Nurse Practitioner

## 2023-09-05 ENCOUNTER — Other Ambulatory Visit: Payer: Self-pay

## 2023-09-05 ENCOUNTER — Ambulatory Visit: Payer: Self-pay | Attending: Internal Medicine | Admitting: Internal Medicine

## 2023-09-05 ENCOUNTER — Encounter: Payer: Self-pay | Admitting: Internal Medicine

## 2023-09-05 VITALS — BP 153/83 | HR 80 | Temp 98.2°F | Ht 70.0 in | Wt 213.0 lb

## 2023-09-05 DIAGNOSIS — E119 Type 2 diabetes mellitus without complications: Secondary | ICD-10-CM

## 2023-09-05 DIAGNOSIS — E1169 Type 2 diabetes mellitus with other specified complication: Secondary | ICD-10-CM

## 2023-09-05 DIAGNOSIS — E785 Hyperlipidemia, unspecified: Secondary | ICD-10-CM

## 2023-09-05 DIAGNOSIS — D500 Iron deficiency anemia secondary to blood loss (chronic): Secondary | ICD-10-CM

## 2023-09-05 DIAGNOSIS — Z794 Long term (current) use of insulin: Secondary | ICD-10-CM

## 2023-09-05 DIAGNOSIS — N951 Menopausal and female climacteric states: Secondary | ICD-10-CM

## 2023-09-05 DIAGNOSIS — I10 Essential (primary) hypertension: Secondary | ICD-10-CM

## 2023-09-05 DIAGNOSIS — Z1211 Encounter for screening for malignant neoplasm of colon: Secondary | ICD-10-CM

## 2023-09-05 DIAGNOSIS — I4581 Long QT syndrome: Secondary | ICD-10-CM

## 2023-09-05 DIAGNOSIS — E1165 Type 2 diabetes mellitus with hyperglycemia: Secondary | ICD-10-CM

## 2023-09-05 DIAGNOSIS — Z7984 Long term (current) use of oral hypoglycemic drugs: Secondary | ICD-10-CM

## 2023-09-05 DIAGNOSIS — I152 Hypertension secondary to endocrine disorders: Secondary | ICD-10-CM

## 2023-09-05 LAB — POCT GLYCOSYLATED HEMOGLOBIN (HGB A1C): HbA1c, POC (controlled diabetic range): 8.6 % — AB (ref 0.0–7.0)

## 2023-09-05 LAB — GLUCOSE, POCT (MANUAL RESULT ENTRY): POC Glucose: 247 mg/dL — AB (ref 70–99)

## 2023-09-05 MED ORDER — INSULIN LISPRO (1 UNIT DIAL) 100 UNIT/ML (KWIKPEN)
5.0000 [IU] | PEN_INJECTOR | Freq: Three times a day (TID) | SUBCUTANEOUS | 2 refills | Status: AC
  Filled 2023-09-05 (×2): qty 6, 40d supply, fill #0
  Filled 2023-12-04 – 2023-12-05 (×2): qty 6, 40d supply, fill #1
  Filled 2024-02-13 – 2024-04-11 (×2): qty 6, 40d supply, fill #2

## 2023-09-05 MED ORDER — INSULIN GLARGINE 100 UNIT/ML SOLOSTAR PEN
20.0000 [IU] | PEN_INJECTOR | Freq: Every day | SUBCUTANEOUS | 1 refills | Status: DC
  Filled 2023-09-05 (×2): qty 6, 30d supply, fill #0
  Filled 2023-09-18 – 2023-10-03 (×3): qty 6, 30d supply, fill #1
  Filled 2023-10-27 – 2023-10-28 (×2): qty 6, 30d supply, fill #2
  Filled 2023-12-04 – 2023-12-05 (×2): qty 6, 30d supply, fill #3
  Filled 2024-01-05 – 2024-01-13 (×2): qty 6, 30d supply, fill #4

## 2023-09-05 MED ORDER — CETIRIZINE HCL 10 MG PO TABS
10.0000 mg | ORAL_TABLET | Freq: Every day | ORAL | 1 refills | Status: AC
  Filled 2023-09-05 (×2): qty 90, 90d supply, fill #0
  Filled 2024-04-08: qty 90, 90d supply, fill #1

## 2023-09-05 NOTE — Patient Instructions (Signed)
 VISIT SUMMARY:  Today, you had a follow-up appointment to check your A1c levels and discuss your diabetes management. Your A1c has increased to 8.6%, and we talked about your current medications, dietary habits, and lifestyle. We also addressed your concerns about long QT syndrome, hypertension, iron deficiency anemia, and perimenopause. Additionally, we reviewed your general health maintenance needs, including upcoming screenings and exams.  YOUR PLAN:  -TYPE 2 DIABETES MELLITUS: Your A1c level has increased to 8.6%, indicating that your blood sugar levels are not well controlled. We discussed increasing your Humalog  dose to 5 units three times daily with meals and continuing your Lantus  at 22 units once daily. It's important to maintain a healthier diet and increase physical activity. We will refer you to a cardiologist for exercise guidance due to your long QT syndrome.  -LONG QT SYNDROME: Long QT syndrome is a heart rhythm condition that can cause fast, chaotic heartbeats. You are currently on propranolol , but you are concerned about exercising. We will refer you to a cardiologist for evaluation and exercise guidance to ensure you can safely increase your physical activity.  -HYPERTENSION: Your blood pressure is slightly elevated at 139/80 mmHg. You are taking propranolol  but occasionally miss doses. We recommend continuing propranolol  and limiting your salt intake to help manage your blood pressure.  -IRON DEFICIENCY ANEMIA: Iron deficiency anemia occurs when your body doesn't have enough iron, leading to fatigue and dizziness. We will order blood tests to check your iron levels and recommend taking iron supplements with a stool softener like MiraLAX or Colace to prevent constipation.  -PERIMENOPAUSE: Perimenopause is the transition period before menopause when you may experience irregular menstrual cycles and symptoms like hot flashes. We will monitor your menstrual cycle over the next two months  to better understand your symptoms.  -GENERAL HEALTH MAINTENANCE: You are due for a diabetic eye exam and colon cancer screening. Your diabetic eye exam is scheduled for next week, and we will provide you with a FIT kit for colon cancer screening. Please complete the FIT kit and return it on the same day.  INSTRUCTIONS:  Please schedule a follow-up appointment with your primary care provider in 3-4 months. Ensure that the results of your diabetic eye exam are sent to your primary care provider. Complete the FIT kit for colon cancer screening and return it on the same day.

## 2023-09-05 NOTE — Progress Notes (Signed)
 Patient ID: Tonya Paul, female    DOB: 1975-11-04  MRN: [id number]  CC: Diabetes (DM f/u. Med refills. /No questions / concerns/)   Subjective: Tonya Paul is a 48 y.o. female who presents for chronic ds management.  PCP is Winda Hastings, NP Her concerns today include:  Patient with history of DM type II, HTN, HL, anemia Discussed the use of AI scribe software for clinical note transcription with the patient, who gave verbal consent to proceed.  History of Present Illness Tonya Paul is a 48 year old female with diabetes who presents for a follow-up and A1c checkup.  DM:  Her A1c is 8.6, increased from 7.8 in September of the previous year. She takes metformin  500 mg 2 tabs twice daily, glipizide  2.5 mg twice daily, Lantus  insulin  20 units daily (should be BID), and Humalog  5 units with lunch and dinner(should be TID with meals). She has been out of Lantus  for four days, which may have contributed to elevated blood sugar levels. Morning blood sugar readings are typically 110 to 140, but it was 240 this morning after eating and without Lantus .  She struggles with dietary management, particularly with snacking on chocolate chip cookies, and leads a sedentary lifestyle due to working from home on the computer all day. She primarily drinks water and has eliminated pork and fried foods from her diet.  She has long QT syndrome and is apprehensive about exercising due to fear of overexertion, especially after her daughter's passing from a heart attack related to long-QT. Her heart rate is managed with propranolol , resting between 70 and 75 bpm.  Used to see cardiologist Dr. Rodolfo Clan but has not seen him in a while.  HTN: She is on propranolol  extended release 160 mg once a day.  She takes medication at bedtime but forgot to take it last night.  Checks blood pressure 1-2 times a week.  She can do better with limiting salt in the foods.  She avoids pork and fried foods.  Still taking the  atorvastatin  20 mg daily for cholesterol.  She has iron deficiency anemia but is not taking iron supplement because of constipation.  She was not taking stool softeners when she took iron.  Reports fatigue and occasional dizziness.   She has heavy menstrual cycles  She missed a menstrual period in May 2 urine pregnancy test including one done at [facility] were negative.  Feels she is about to go through menopause because she gets some hot flashes..    Patient Active Problem List   Diagnosis Date Noted   Abnormal uterine bleeding 03/04/2022   Pain of left patellofemoral joint 09/10/2020   Elevated coronary artery calcium  score 01/04/2020   Hyperlipidemia 01/04/2020   Essential hypertension 06/14/2019   Dizziness 06/14/2019   Abdominal pain, chronic, epigastric    GERD (gastroesophageal reflux disease) 06/20/2017   Long Q-T syndrome 01/14/2017   Palpitations 02/03/2016   Uterine fibroid 12/11/2015   Family history of sudden cardiac death 12-27-15   Left breast mass 09/01/2015   Obesity 09/01/2015   Insulin  dependent type 2 diabetes mellitus (HCC) 04/16/2015     Current Outpatient Medications on File Prior to Visit  Medication Sig Dispense Refill   atorvastatin  (LIPITOR) 20 MG tablet Take 1 tablet (20 mg total) by mouth daily. 30 tablet 0   EPINEPHrine  0.3 mg/0.3 mL IJ SOAJ injection Inject 0.3 mg into the muscle as needed for anaphylaxis. 2 each 1   glipiZIDE  (GLUCOTROL ) 5 MG tablet Take  0.5 tablets (2.5 mg total) by mouth 2 (two) times daily before a meal. 90 tablet 1   ibuprofen  (ADVIL ) 600 MG tablet Take 1 tablet (600 mg total) by mouth every 8 (eight) hours as needed. 60 tablet 1   ipratropium (ATROVENT  HFA) 17 MCG/ACT inhaler Inhale 1-2 puffs into the lungs every 4 (four) hours as needed for wheezing. 1 each 0   metFORMIN  (GLUCOPHAGE ) 500 MG tablet Take 2 tablets (1,000 mg total) by mouth 2 (two) times daily with a meal. 120 tablet 0   propranolol  ER (INDERAL  LA) 160 MG SR capsule Take  1 capsule (160 mg total) by mouth daily. 90 capsule 1   cetirizine  (ZYRTEC  ALLERGY) 10 MG tablet Take 1 tablet (10 mg total) by mouth at bedtime. 90 tablet 1   fluticasone  (FLOVENT  HFA) 110 MCG/ACT inhaler Inhale 1 puff into the lungs every 12 (twelve) hours as needed. (Patient not taking: Reported on 09/05/2023) 1 each 0   glucose blood (TRUE METRIX BLOOD GLUCOSE TEST) test strip Use as instructed (Patient not taking: Reported on 09/05/2023) 100 each 12   insulin  glargine (LANTUS ) 100 UNIT/ML Solostar Pen Inject 20 Units into the skin 2 (two) times daily. 15 mL 0   insulin  lispro (HUMALOG  KWIKPEN) 100 UNIT/ML KwikPen Inject 5 Units into the skin 3 (three) times daily. 6 mL 2   TRUEplus Lancets 28G MISC Use as instructed. Check blood glucose level by fingerstick twice per day. 100 each 3   [DISCONTINUED] lisinopril  (ZESTRIL ) 5 MG tablet Take 1 tablet (5 mg total) by mouth daily. 90 tablet 3   No current facility-administered medications on file prior to visit.    Allergies  Allergen Reactions   Ace Inhibitors Shortness Of Breath    Reported by the patient after a trial of lisinopril  prescribes by PCP   Aspirin Anaphylaxis   Rocephin  [Ceftriaxone ] Itching   Metronidazole  Hives   Ciprofloxacin  Itching   Prednisone  Palpitations    Social History   Socioeconomic History   Marital status: Divorced    Spouse name: Not on file   Number of children: 2   Years of education: Not on file   Highest education level: Not on file  Occupational History   Not on file  Tobacco Use   Smoking status: Never   Smokeless tobacco: Never  Vaping Use   Vaping status: Never Used  Substance and Sexual Activity   Alcohol use: No   Drug use: No   Sexual activity: Yes    Birth control/protection: None  Other Topics Concern   Not on file  Social History Narrative   Not on file   Social Drivers of Health   Financial Resource Strain: Not on file  Food Insecurity: No Food Insecurity (08/18/2022)    [facility]   Hunger Vital Sign    Within the past 12 months, you worried that your food would run out before you got the money to buy more.: Never true    Within the past 12 months, the food you bought just didn't last and you didn't have money to get more.: Never true  Transportation Needs: No Transportation Needs (08/18/2022)   [facility] from [facility] - Transportation    Lack of Transportation (Medical): No    Lack of Transportation (Non-Medical): No  Physical Activity: Not on file  Stress: No Stress Concern Present (08/18/2022)   Received from [facility] - Occupational Stress Questionnaire  Feeling of Stress : Not at all  Social Connections: Unknown (08/17/2022)   [facility]   Social Network    Social Network: Not on file  Intimate Partner Violence: Not At Risk (04/08/2023)   Received from [facility]   HITS    Over the last 12 months how often did your partner physically hurt you?: Never    Over the last 12 months how often did your partner insult you or talk down to you?: Never    Over the last 12 months how often did your partner threaten you with physical harm?: Never    Over the last 12 months how often did your partner scream or curse at you?: Never    Family History  Problem Relation Age of Onset   Hypertension Mother    Lung cancer Mother    Throat cancer Father    Diabetes Sister    Lupus Maternal Grandmother    Stroke Maternal Grandfather    Sudden Cardiac Death Daughter    Breast cancer Paternal Aunt     Past Surgical History:  Procedure Laterality Date   BREAST BIOPSY Left 09/03/2015   US  Core bx, benign   ESOPHAGOGASTRODUODENOSCOPY (EGD) WITH PROPOFOL  N/A 06/21/2017   Procedure: ESOPHAGOGASTRODUODENOSCOPY (EGD) WITH PROPOFOL ;  Surgeon: Selena Daily, MD;  Location: [facility] ENDOSCOPY;  Service: Gastroenterology;  Laterality: N/A;    ROS: Review of  Systems Negative except as stated above  PHYSICAL EXAM: BP 139/80 (BP Location: Left Arm, Patient Position: Sitting, Cuff Size: Normal)   Pulse 77   Temp 98.2 F (36.8 C) (Oral)   Ht 5' 10 (1.778 m)   Wt 213 lb (96.6 kg)   SpO2 98%   BMI 30.56 kg/m   Wt Readings from Last 3 Encounters:  09/05/23 213 lb (96.6 kg)  11/29/22 206 lb 9.6 oz (93.7 kg)  07/08/22 204 lb 1.6 oz (92.6 kg)    Physical Exam  General appearance - alert, well appearing, and in no distress Mental status - normal mood, behavior, speech, dress, motor activity, and thought processes Eyes - pale conjunctiva Chest - clear to auscultation, no wheezes, rales or rhonchi, symmetric air entry Heart - normal rate, regular rhythm, normal S1, S2, no murmurs, rubs, clicks or gallops Extremities - peripheral pulses normal, no pedal edema, no clubbing or cyanosis      Latest Ref Rng & Units 11/29/2022    2:36 PM 04/01/2022    3:04 PM 02/13/2022    9:16 AM  CMP  Glucose 70 - 99 mg/dL 161  096  045   BUN 6 - 24 mg/dL 9  11  8    Creatinine 0.57 - 1.00 mg/dL 4.09  8.11  9.14   Sodium 134 - 144 mmol/L 136  136  136   Potassium 3.5 - 5.2 mmol/L 4.7  3.5  3.7   Chloride 96 - 106 mmol/L 99  109  104   CO2 20 - 29 mmol/L 20  19  21    Calcium  8.7 - 10.2 mg/dL 9.7  9.2  9.0   Total Protein 6.0 - 8.5 g/dL 7.1  8.0  7.0   Total Bilirubin 0.0 - 1.2 mg/dL <7.8  0.5  0.4   Alkaline Phos 44 - 121 IU/L 63  52  57   AST 0 - 40 IU/L 20  21  21    ALT 0 - 32 IU/L 22  19  17     Lipid Panel     Component Value  Date/Time   CHOL 149 05/20/2021 1038   TRIG 68 05/20/2021 1038   HDL 38 (L) 05/20/2021 1038   CHOLHDL 3.9 05/20/2021 1038   CHOLHDL 3.6 04/18/2015 0936   VLDL 13 04/18/2015 0936   LDLCALC 97 05/20/2021 1038    CBC    Component Value Date/Time   WBC 8.6 11/29/2022 1436   WBC 8.0 04/01/2022 1504   RBC 4.29 11/29/2022 1436   RBC 4.10 04/01/2022 1504   HGB 9.9 (L) 11/29/2022 1436   HCT 30.9 (L) 11/29/2022 1436   PLT  531 (H) 11/29/2022 1436   MCV 72 (L) 11/29/2022 1436   MCH 23.1 (L) 11/29/2022 1436   MCH 22.9 (L) 04/01/2022 1504   MCHC 32.0 11/29/2022 1436   MCHC 30.1 04/01/2022 1504   RDW 15.1 11/29/2022 1436   LYMPHSABS 2.9 11/29/2022 1436   MONOABS 0.8 05/05/2018 0915   EOSABS 0.4 11/29/2022 1436   BASOSABS 0.0 11/29/2022 1436    ASSESSMENT AND PLAN: 1. Type 2 diabetes mellitus with hyperglycemia, with long-term current use of insulin  (HCC) (Primary) Not at goal.  She has been out of Lantus  for 4 days.  Refill sent.  Advised to continue the Lantus  as 20 mg once a day, glipizide  2.5 mg twice a day, metformin  at 1000 mg twice a day.  Advised to increase Humalog  to 3 times a day with meals.  Dietary counseling given.  Advised choosing healthier snacks like fruits or nuts rather than cookies. - POCT glycosylated hemoglobin (Hb A1C) - POCT glucose (manual entry) - Microalbumin / creatinine urine ratio  2. Diabetes mellitus treated with oral medication (HCC) See #1 above. 3. Hyperlipidemia associated with type 2 diabetes mellitus (HCC) Continue atorvastatin  20 mg daily.  4. Long Q-T syndrome Advised patient to avoid competitive contact sports.  Will try to avoid medications that can cause further prolongation of QT which can lead to arrhythmia.  Will get her back in with cardiology for more direct advice regarding what she can do in terms of exercise - Ambulatory referral to Cardiology  5. Hypertension associated with type 2 diabetes mellitus (HCC) Not at goal.  On propranolol  but forgot to take her medication last night.  She will take it tonight.  Will need blood pressure recheck with her PCP.  6. Iron deficiency anemia due to chronic blood loss I suspect she is still anemic because she has pale conjunctiva.  Will check CBC and iron studies.  She has ferrous sulfate  at home which she has purchased over-the-counter.  I recommend that she start taking 1 daily and to purchase some MiraLAX or Colace  to take daily with it. - Iron, TIBC and Ferritin Panel - CBC  7. Perimenopausal Can discuss further with her PCP on next visit  8. Screening for colon cancer - Fecal occult blood, imunochemical([facility])      Patient was given the opportunity to ask questions.  Patient verbalized understanding of the plan and was able to repeat key elements of the plan.   This documentation was completed using Paediatric nurse.  Any transcriptional errors are unintentional.  Orders Placed This Encounter  Procedures   POCT glycosylated hemoglobin (Hb A1C)   POCT glucose (manual entry)     Requested Prescriptions   Pending Prescriptions Disp Refills   insulin  glargine (LANTUS ) 100 UNIT/ML Solostar Pen 15 mL 0    Sig: Inject 20 Units into the skin 2 (two) times daily.   insulin  lispro (HUMALOG  KWIKPEN) 100 UNIT/ML KwikPen 6 mL  2    Sig: Inject 5 Units into the skin 3 (three) times daily.   cetirizine  (ZYRTEC  ALLERGY) 10 MG tablet 90 tablet 1    Sig: Take 1 tablet (10 mg total) by mouth at bedtime.    No follow-ups on file.  Concetta Dee, MD, FACP

## 2023-09-07 ENCOUNTER — Ambulatory Visit: Payer: Self-pay | Admitting: Internal Medicine

## 2023-09-07 LAB — IRON,TIBC AND FERRITIN PANEL
Ferritin: 12 ng/mL — ABNORMAL LOW (ref 15–150)
Iron Saturation: 5 % — CL (ref 15–55)
Iron: 20 ug/dL — ABNORMAL LOW (ref 27–159)
Total Iron Binding Capacity: 399 ug/dL (ref 250–450)
UIBC: 379 ug/dL (ref 131–425)

## 2023-09-07 LAB — CBC
Hematocrit: 30.8 % — ABNORMAL LOW (ref 34.0–46.6)
Hemoglobin: 9.6 g/dL — ABNORMAL LOW (ref 11.1–15.9)
MCH: 23.4 pg — ABNORMAL LOW (ref 26.6–33.0)
MCHC: 31.2 g/dL — ABNORMAL LOW (ref 31.5–35.7)
MCV: 75 fL — ABNORMAL LOW (ref 79–97)
Platelets: 439 10*3/uL (ref 150–450)
RBC: 4.11 x10E6/uL (ref 3.77–5.28)
RDW: 15.3 % (ref 11.7–15.4)
WBC: 8.3 10*3/uL (ref 3.4–10.8)

## 2023-09-07 LAB — MICROALBUMIN / CREATININE URINE RATIO
Creatinine, Urine: 97.2 mg/dL
Microalb/Creat Ratio: 42 mg/g{creat} — ABNORMAL HIGH (ref 0–29)
Microalbumin, Urine: 40.6 ug/mL

## 2023-09-08 ENCOUNTER — Telehealth: Payer: Self-pay

## 2023-09-09 ENCOUNTER — Ambulatory Visit: Payer: Self-pay | Attending: Nurse Practitioner

## 2023-09-19 ENCOUNTER — Other Ambulatory Visit: Payer: Self-pay

## 2023-09-19 ENCOUNTER — Other Ambulatory Visit (HOSPITAL_COMMUNITY): Payer: Self-pay

## 2023-09-21 ENCOUNTER — Ambulatory Visit: Payer: Self-pay | Admitting: Nurse Practitioner

## 2023-09-21 ENCOUNTER — Other Ambulatory Visit: Payer: Self-pay

## 2023-10-03 ENCOUNTER — Other Ambulatory Visit: Payer: Self-pay

## 2023-10-07 ENCOUNTER — Other Ambulatory Visit (HOSPITAL_COMMUNITY): Payer: Self-pay

## 2023-10-07 ENCOUNTER — Other Ambulatory Visit: Payer: Self-pay

## 2023-10-27 ENCOUNTER — Other Ambulatory Visit: Payer: Self-pay | Admitting: Nurse Practitioner

## 2023-10-27 ENCOUNTER — Other Ambulatory Visit: Payer: Self-pay

## 2023-10-27 ENCOUNTER — Other Ambulatory Visit (HOSPITAL_COMMUNITY): Payer: Self-pay

## 2023-10-27 DIAGNOSIS — Z794 Long term (current) use of insulin: Secondary | ICD-10-CM

## 2023-10-27 MED ORDER — METFORMIN HCL 500 MG PO TABS
1000.0000 mg | ORAL_TABLET | Freq: Two times a day (BID) | ORAL | 0 refills | Status: DC
Start: 2023-10-27 — End: 2023-12-05
  Filled 2023-10-27 (×2): qty 120, 30d supply, fill #0

## 2023-10-28 ENCOUNTER — Other Ambulatory Visit (HOSPITAL_COMMUNITY): Payer: Self-pay

## 2023-10-28 ENCOUNTER — Other Ambulatory Visit: Payer: Self-pay

## 2023-10-31 ENCOUNTER — Other Ambulatory Visit: Payer: Self-pay

## 2023-12-04 ENCOUNTER — Other Ambulatory Visit: Payer: Self-pay | Admitting: Nurse Practitioner

## 2023-12-04 DIAGNOSIS — E1165 Type 2 diabetes mellitus with hyperglycemia: Secondary | ICD-10-CM

## 2023-12-05 ENCOUNTER — Other Ambulatory Visit: Payer: Self-pay | Admitting: Nurse Practitioner

## 2023-12-05 ENCOUNTER — Other Ambulatory Visit (HOSPITAL_COMMUNITY): Payer: Self-pay

## 2023-12-05 ENCOUNTER — Other Ambulatory Visit: Payer: Self-pay

## 2023-12-05 DIAGNOSIS — E1165 Type 2 diabetes mellitus with hyperglycemia: Secondary | ICD-10-CM

## 2023-12-05 MED ORDER — IBUPROFEN 600 MG PO TABS
600.0000 mg | ORAL_TABLET | Freq: Three times a day (TID) | ORAL | 0 refills | Status: AC | PRN
  Filled 2023-12-05: qty 60, 20d supply, fill #0

## 2023-12-06 ENCOUNTER — Other Ambulatory Visit: Payer: Self-pay

## 2023-12-06 MED ORDER — METFORMIN HCL 500 MG PO TABS
1000.0000 mg | ORAL_TABLET | Freq: Two times a day (BID) | ORAL | 0 refills | Status: DC
  Filled 2023-12-06: qty 120, 30d supply, fill #0

## 2023-12-07 ENCOUNTER — Other Ambulatory Visit: Payer: Self-pay

## 2023-12-07 ENCOUNTER — Other Ambulatory Visit (HOSPITAL_COMMUNITY): Payer: Self-pay

## 2024-01-05 ENCOUNTER — Other Ambulatory Visit: Payer: Self-pay | Admitting: Nurse Practitioner

## 2024-01-05 ENCOUNTER — Other Ambulatory Visit: Payer: Self-pay

## 2024-01-05 ENCOUNTER — Other Ambulatory Visit: Payer: Self-pay | Admitting: Family Medicine

## 2024-01-05 DIAGNOSIS — I10 Essential (primary) hypertension: Secondary | ICD-10-CM

## 2024-01-05 DIAGNOSIS — E1165 Type 2 diabetes mellitus with hyperglycemia: Secondary | ICD-10-CM

## 2024-01-06 ENCOUNTER — Ambulatory Visit: Payer: Self-pay | Admitting: Nurse Practitioner

## 2024-01-09 ENCOUNTER — Other Ambulatory Visit (HOSPITAL_COMMUNITY): Payer: Self-pay

## 2024-01-09 ENCOUNTER — Other Ambulatory Visit: Payer: Self-pay

## 2024-01-10 ENCOUNTER — Other Ambulatory Visit: Payer: Self-pay

## 2024-01-10 ENCOUNTER — Other Ambulatory Visit (HOSPITAL_BASED_OUTPATIENT_CLINIC_OR_DEPARTMENT_OTHER): Payer: Self-pay

## 2024-01-10 ENCOUNTER — Other Ambulatory Visit (HOSPITAL_COMMUNITY): Payer: Self-pay

## 2024-01-11 ENCOUNTER — Other Ambulatory Visit: Payer: Self-pay

## 2024-01-11 ENCOUNTER — Other Ambulatory Visit (HOSPITAL_COMMUNITY): Payer: Self-pay

## 2024-01-12 ENCOUNTER — Encounter: Payer: Self-pay | Admitting: Nurse Practitioner

## 2024-01-12 ENCOUNTER — Telehealth: Payer: Self-pay | Admitting: Nurse Practitioner

## 2024-01-12 ENCOUNTER — Other Ambulatory Visit (HOSPITAL_COMMUNITY): Payer: Self-pay

## 2024-01-12 ENCOUNTER — Other Ambulatory Visit: Payer: Self-pay | Admitting: Nurse Practitioner

## 2024-01-12 DIAGNOSIS — I10 Essential (primary) hypertension: Secondary | ICD-10-CM

## 2024-01-12 NOTE — Telephone Encounter (Unsigned)
 Copied from CRM [id number]. Topic: Clinical - Medication Refill >> Jan 12, 2024  9:28 AM Myrick T wrote: Patient needs a refill to get her to her next appt on 11/19  Medication: propranolol  ER (INDERAL  LA) 160 MG SR capsule   Has the patient contacted their pharmacy? No (Agent: If no, request that the patient contact the pharmacy for the refill. If patient does not wish to contact the pharmacy document the reason why and proceed with request.) (Agent: If yes, when and what did the pharmacy advise?)  This is the patient's preferred pharmacy:  [facility] [address] [place] [address] Phone: [phone] Fax: [phone]  Is this the correct pharmacy for this prescription? Yes  Has the prescription been filled recently? Yes  Is the patient out of the medication? Yes  Has the patient been seen for an appointment in the last year OR does the patient have an upcoming appointment? Yes  Can we respond through MyChart? Yes  Agent: Please be advised that Rx refills may take up to 3 business days. We ask that you follow-up with your pharmacy.

## 2024-01-13 ENCOUNTER — Other Ambulatory Visit: Payer: Self-pay

## 2024-01-13 ENCOUNTER — Other Ambulatory Visit (HOSPITAL_COMMUNITY): Payer: Self-pay

## 2024-01-13 MED ORDER — PROPRANOLOL HCL ER 160 MG PO CP24
160.0000 mg | ORAL_CAPSULE | Freq: Every day | ORAL | 0 refills | Status: DC
  Filled 2024-01-13: qty 30, 30d supply, fill #0

## 2024-01-13 NOTE — Telephone Encounter (Signed)
 Requested Prescriptions  Pending Prescriptions Disp Refills   propranolol  ER (INDERAL  LA) 160 MG SR capsule 30 capsule 0    Sig: Take 1 capsule (160 mg total) by mouth daily.     Cardiovascular:  Beta Blockers Failed - 01/13/2024  2:09 PM      Failed - Last BP in normal range    BP Readings from Last 1 Encounters:  09/05/23 (!) 153/83         Passed - Last Heart Rate in normal range    Pulse Readings from Last 1 Encounters:  09/05/23 80         Passed - Valid encounter within last 6 months    Recent Outpatient Visits           4 months ago Type 2 diabetes mellitus with hyperglycemia, with long-term current use of insulin  (HCC)   [facility] Of [place]. [facility] Vicci Sober B, MD   1 year ago Type 2 diabetes mellitus with hyperglycemia, with long-term current use of insulin  [facility])   [facility] - A Dept Of [place]. [facility] [place], [place] W, NP   1 year ago Type 2 diabetes mellitus with hyperglycemia, with long-term current use of insulin  [facility])   [facility] - A Dept Of [place]. [facility], [place], [place]   2 years ago Type 2 diabetes mellitus with hyperglycemia, with long-term current use of insulin  [facility])   [facility] - A Dept Of [place]. [facility] [place], [place] W, NP   2 years ago Type 2 diabetes mellitus with hyperglycemia, with long-term current use of insulin  [facility])   [place] [facility] - A Dept Of [place]. [facility] Theotis Haze [place], [place]

## 2024-01-20 ENCOUNTER — Other Ambulatory Visit: Payer: Self-pay

## 2024-02-03 ENCOUNTER — Telehealth (INDEPENDENT_AMBULATORY_CARE_PROVIDER_SITE_OTHER): Payer: Self-pay | Admitting: Nurse Practitioner

## 2024-02-03 NOTE — Telephone Encounter (Signed)
 Called pt to reschedule appt bc pt scheduled for a day the provider will not be in office. Please advise and reschedule if pt call back.

## 2024-02-08 ENCOUNTER — Ambulatory Visit (INDEPENDENT_AMBULATORY_CARE_PROVIDER_SITE_OTHER): Payer: Self-pay | Admitting: Primary Care

## 2024-02-13 ENCOUNTER — Other Ambulatory Visit: Payer: Self-pay | Admitting: Internal Medicine

## 2024-02-13 ENCOUNTER — Other Ambulatory Visit: Payer: Self-pay | Admitting: Family Medicine

## 2024-02-13 ENCOUNTER — Other Ambulatory Visit: Payer: Self-pay | Admitting: Nurse Practitioner

## 2024-02-13 DIAGNOSIS — Z794 Long term (current) use of insulin: Secondary | ICD-10-CM

## 2024-02-13 DIAGNOSIS — I10 Essential (primary) hypertension: Secondary | ICD-10-CM

## 2024-02-13 DIAGNOSIS — E785 Hyperlipidemia, unspecified: Secondary | ICD-10-CM

## 2024-02-14 ENCOUNTER — Other Ambulatory Visit (HOSPITAL_COMMUNITY): Payer: Self-pay

## 2024-02-14 MED ORDER — GLIPIZIDE 5 MG PO TABS
2.5000 mg | ORAL_TABLET | Freq: Two times a day (BID) | ORAL | 0 refills | Status: DC
  Filled 2024-02-14 – 2024-02-15 (×2): qty 30, 30d supply, fill #0

## 2024-02-14 MED ORDER — INSULIN GLARGINE 100 UNIT/ML SOLOSTAR PEN
20.0000 [IU] | PEN_INJECTOR | Freq: Every day | SUBCUTANEOUS | 0 refills | Status: DC
  Filled 2024-02-14 – 2024-02-15 (×2): qty 3, 15d supply, fill #0

## 2024-02-14 MED ORDER — PROPRANOLOL HCL ER 160 MG PO CP24
160.0000 mg | ORAL_CAPSULE | Freq: Every day | ORAL | 0 refills | Status: DC
  Filled 2024-02-14 – 2024-02-15 (×2): qty 30, 30d supply, fill #0

## 2024-02-14 MED ORDER — ATORVASTATIN CALCIUM 20 MG PO TABS
20.0000 mg | ORAL_TABLET | Freq: Every day | ORAL | 0 refills | Status: AC
  Filled 2024-02-14 – 2024-02-15 (×2): qty 30, 30d supply, fill #0

## 2024-02-14 MED ORDER — METFORMIN HCL 500 MG PO TABS
1000.0000 mg | ORAL_TABLET | Freq: Two times a day (BID) | ORAL | 0 refills | Status: AC
  Filled 2024-02-14 – 2024-02-15 (×2): qty 120, 30d supply, fill #0

## 2024-02-14 NOTE — Telephone Encounter (Signed)
 Requested medications are due for refill today.  yes  Requested medications are on the active medications list.  yes  Last refill. 12/06/2023 #120 0 rf  Future visit scheduled.   No   Notes to clinic.  Expired labs    Requested Prescriptions  Pending Prescriptions Disp Refills   metFORMIN  (GLUCOPHAGE ) 500 MG tablet 120 tablet 0    Sig: Take 2 tablets (1,000 mg total) by mouth 2 (two) times daily with a meal.     Endocrinology:  Diabetes - Biguanides Failed - 02/14/2024  4:40 PM      Failed - Cr in normal range and within 360 days    Creat  Date Value Ref Range Status  04/18/2015 0.84 0.50 - 1.10 mg/dL Final   Creatinine, Ser  Date Value Ref Range Status  11/29/2022 0.76 0.57 - 1.00 mg/dL Final   Creatinine, POC  Date Value Ref Range Status  07/15/2016 200 mg/dL Final   Creatinine, Urine  Date Value Ref Range Status  04/18/2015 193 20 - 320 mg/dL Final         Failed - HBA1C is between 0 and 7.9 and within 180 days    HbA1c, POC (controlled diabetic range)  Date Value Ref Range Status  09/05/2023 8.6 (A) 0.0 - 7.0 % Final         Failed - eGFR in normal range and within 360 days    GFR, Est African American  Date Value Ref Range Status  04/18/2015 >89 >=60 mL/min Final   GFR calc Af Amer  Date Value Ref Range Status  12/13/2019 >60 >60 mL/min Final   GFR, Est Non African American  Date Value Ref Range Status  04/18/2015 88 >=60 mL/min Final    Comment:      The estimated GFR is a calculation valid for adults (>=64 years old) that uses the CKD-EPI algorithm to adjust for age and sex. It is   not to be used for children, pregnant women, hospitalized patients,    patients on dialysis, or with rapidly changing kidney function. According to the NKDEP, eGFR >89 is normal, 60-89 shows mild impairment, 30-59 shows moderate impairment, 15-29 shows severe impairment and <15 is ESRD.      GFR, Estimated  Date Value Ref Range Status  04/01/2022 >60 >60 mL/min  Final    Comment:    (NOTE) Calculated using the CKD-EPI Creatinine Equation (2021)    eGFR  Date Value Ref Range Status  11/29/2022 97 >59 mL/min/1.73 Final         Failed - B12 Level in normal range and within 720 days    No results found for: VITAMINB12       Failed - CBC within normal limits and completed in the last 12 months    WBC  Date Value Ref Range Status  09/05/2023 8.3 3.4 - 10.8 x10E3/uL Final  04/01/2022 8.0 4.0 - 10.5 K/uL Final   RBC  Date Value Ref Range Status  09/05/2023 4.11 3.77 - 5.28 x10E6/uL Final  04/01/2022 4.10 3.87 - 5.11 MIL/uL Final   Hemoglobin  Date Value Ref Range Status  09/05/2023 9.6 (L) 11.1 - 15.9 g/dL Final   Hematocrit  Date Value Ref Range Status  09/05/2023 30.8 (L) 34.0 - 46.6 % Final   MCHC  Date Value Ref Range Status  09/05/2023 31.2 (L) 31.5 - 35.7 g/dL Final  98/88/7975 69.8 30.0 - 36.0 g/dL Final   [facility]  Date Value Ref Range Status  09/05/2023  23.4 (L) 26.6 - 33.0 pg Final  04/01/2022 22.9 (L) 26.0 - 34.0 pg Final   MCV  Date Value Ref Range Status  09/05/2023 75 (L) 79 - 97 fL Final   No results found for: PLTCOUNTKUC, LABPLAT, POCPLA RDW  Date Value Ref Range Status  09/05/2023 15.3 11.7 - 15.4 % Final         Passed - Valid encounter within last 6 months    Recent Outpatient Visits           5 months ago Type 2 diabetes mellitus with hyperglycemia, with long-term current use of insulin  (HCC)   [facility] Dept Of [place]. [facility] Tonya Sober Paul, Tonya Paul   1 year ago Type 2 diabetes mellitus with hyperglycemia, with long-term current use of insulin  [facility])   [facility] - A Dept Of [place]. [facility] [place], [place] W, NP   1 year ago Type 2 diabetes mellitus with hyperglycemia, with long-term current use of insulin  [facility])   [place] [facility] - A Dept Of [place]. [facility] [place], [place], [place]   2  years ago Type 2 diabetes mellitus with hyperglycemia, with long-term current use of insulin  [facility])   [place] [facility] - A Dept Of [place]. [facility] [place], [place] W, NP   2 years ago Type 2 diabetes mellitus with hyperglycemia, with long-term current use of insulin  [facility])   [place] [facility] - A Dept Of [place]. [facility] [place], [place]

## 2024-02-14 NOTE — Telephone Encounter (Signed)
 Requested medications are due for refill today.  yes  Requested medications are on the active medications list.  yes  Last refill. 09/05/2023 15mL 1 rf  Future visit scheduled.   no  Notes to clinic.  Expired labs    Requested Prescriptions  Pending Prescriptions Disp Refills   insulin  glargine (LANTUS ) 100 UNIT/ML Solostar Pen 15 mL 1    Sig: Inject 20 Units into the skin at bedtime.     Endocrinology:  Diabetes - Insulins Failed - 02/14/2024  4:44 PM      Failed - HBA1C is between 0 and 7.9 and within 180 days    HbA1c, POC (controlled diabetic range)  Date Value Ref Range Status  09/05/2023 8.6 (A) 0.0 - 7.0 % Final         Passed - Valid encounter within last 6 months    Recent Outpatient Visits           5 months ago Type 2 diabetes mellitus with hyperglycemia, with long-term current use of insulin  [facility])   [facility] A Dept Of [place]. [facility] Vicci Sober B, MD   1 year ago Type 2 diabetes mellitus with hyperglycemia, with long-term current use of insulin  [facility])   [facility] [facility] - A Dept Of [place]. [facility] [place], [place] W, NP   1 year ago Type 2 diabetes mellitus with hyperglycemia, with long-term current use of insulin  [facility])   [facility] - A Dept Of [place]. [facility] [place], [place], [place]   2 years ago Type 2 diabetes mellitus with hyperglycemia, with long-term current use of insulin  [facility])   [place] [facility] - A Dept Of [place]. [facility] [place], [place] W, NP   2 years ago Type 2 diabetes mellitus with hyperglycemia, with long-term current use of insulin  [facility])   [facility] - A Dept Of [place]. [facility] Theot[facility] [place], [place]

## 2024-02-14 NOTE — Telephone Encounter (Signed)
 Requested medications are due for refill today.  yes  Requested medications are on the active medications list.  es  Last refill. varied  Future visit scheduled.   no  Notes to clinic.  Labs are expired.    Requested Prescriptions  Pending Prescriptions Disp Refills   atorvastatin  (LIPITOR) 20 MG tablet 30 tablet 0    Sig: Take 1 tablet (20 mg total) by mouth daily.     Cardiovascular:  Antilipid - Statins Failed - 02/14/2024  4:58 PM      Failed - Lipid Panel in normal range within the last 12 months    Cholesterol, Total  Date Value Ref Range Status  05/20/2021 149 100 - 199 mg/dL Final   LDL Chol Calc (NIH)  Date Value Ref Range Status  05/20/2021 97 0 - 99 mg/dL Final   HDL  Date Value Ref Range Status  05/20/2021 38 (L) >39 mg/dL Final   Triglycerides  Date Value Ref Range Status  05/20/2021 68 0 - 149 mg/dL Final         Passed - Patient is not pregnant      Passed - Valid encounter within last 12 months    Recent Outpatient Visits           5 months ago Type 2 diabetes mellitus with hyperglycemia, with long-term current use of insulin  (HCC)   [place] [facility] Dept Of [place]. [facility] Vicci Sober B, MD   1 year ago Type 2 diabetes mellitus with hyperglycemia, with long-term current use of insulin  [facility])   [place] [facility] - A Dept Of [place]. [facility] Theotis Ohm W, NP   1 year ago Type 2 diabetes mellitus with hyperglycemia, with long-term current use of insulin  [facility])   [place] [facility] - A Dept Of [place]. [facility] [place], [place], [place]   2 years ago Type 2 diabetes mellitus with hyperglycemia, with long-term current use of insulin  [facility])   [place] [facility] - A Dept Of [place]. [facility] [place], [place] W, NP   2 years ago Type 2 diabetes mellitus with hyperglycemia, with long-term current use of insulin  [facility])   [place] Comm  [facility] Shelly - A Dept Of [place]. [facility] [place], [place] W, NP               glipiZIDE  (GLUCOTROL ) 5 MG tablet 90 tablet 1    Sig: Take 0.5 tablets (2.5 mg total) by mouth 2 (two) times daily before a meal.     Endocrinology:  Diabetes - Sulfonylureas Failed - 02/14/2024  4:58 PM      Failed - HBA1C is between 0 and 7.9 and within 180 days    HbA1c, POC (controlled diabetic range)  Date Value Ref Range Status  09/05/2023 8.6 (A) 0.0 - 7.0 % Final         Failed - Cr in normal range and within 360 days    Creat  Date Value Ref Range Status  04/18/2015 0.84 0.50 - 1.10 mg/dL Final   Creatinine, Ser  Date Value Ref Range Status  11/29/2022 0.76 0.57 - 1.00 mg/dL Final   Creatinine, POC  Date Value Ref Range Status  07/15/2016 200 mg/dL Final   Creatinine, Urine  Date Value Ref Range Status  04/18/2015 193 20 - 320 mg/dL Final         Passed -  Valid encounter within last 6 months    Recent Outpatient Visits           5 months ago Type 2 diabetes mellitus with hyperglycemia, with long-term current use of insulin  [facility])   [place] [facility] - A Dept Of [employer]. [facility] Vicci Sober B, MD   1 year ago Type 2 diabetes mellitus with hyperglycemia, with long-term current use of insulin  [facility])   [place] [facility] - A Dept Of [place]. [facility] [place], [place] W, NP   1 year ago Type 2 diabetes mellitus with hyperglycemia, with long-term current use of insulin  [facility])   [place] [facility] - A Dept Of [place]. [facility] [place], [place], [place]   2 years ago Type 2 diabetes mellitus with hyperglycemia, with long-term current use of insulin  [facility])   [place] [facility] - A Dept Of [place]. [facility] [place], [place] W, NP   2 years ago Type 2 diabetes mellitus with hyperglycemia, with long-term current use of insulin  [facility])   [place] [facility] - A Dept  Of [place]. [facility] Theotis Haze ORN, NP              Signed Prescriptions Disp Refills   propranolol  ER (INDERAL  LA) 160 MG SR capsule 30 capsule 0    Sig: Take 1 capsule (160 mg total) by mouth daily.     Cardiovascular:  Beta Blockers Failed - 02/14/2024  4:58 PM      Failed - Last BP in normal range    BP Readings from Last 1 Encounters:  09/05/23 (!) 153/83         Passed - Last Heart Rate in normal range    Pulse Readings from Last 1 Encounters:  09/05/23 80         Passed - Valid encounter within last 6 months    Recent Outpatient Visits           5 months ago Type 2 diabetes mellitus with hyperglycemia, with long-term current use of insulin  (HCC)   [place] [facility] - A Dept Of [place]. [facility] Vicci Sober B, MD   1 year ago Type 2 diabetes mellitus with hyperglycemia, with long-term current use of insulin  [facility])   [place] [facility] - A Dept Of [place]. [facility] [place], [place] W, NP   1 year ago Type 2 diabetes mellitus with hyperglycemia, with long-term current use of insulin  [facility])   [place] [facility] - A Dept Of [place]. [facility] [employer], [place], [place]   2 years ago Type 2 diabetes mellitus with hyperglycemia, with long-term current use of insulin  [facility])   [place] [facility] - A Dept Of [place]. [facility] [place], [place] W, NP   2 years ago Type 2 diabetes mellitus with hyperglycemia, with long-term current use of insulin  [facility])    Comm Health Shelly - A Dept Of [place]. [facility] Theotis Haze ORN, [place]

## 2024-02-14 NOTE — Telephone Encounter (Signed)
 Courtesy refill. Patient will need an office visit for additional refills.  Requested Prescriptions  Pending Prescriptions Disp Refills   atorvastatin  (LIPITOR) 20 MG tablet 30 tablet 0    Sig: Take 1 tablet (20 mg total) by mouth daily.     Cardiovascular:  Antilipid - Statins Failed - 02/14/2024  4:57 PM      Failed - Lipid Panel in normal range within the last 12 months    Cholesterol, Total  Date Value Ref Range Status  05/20/2021 149 100 - 199 mg/dL Final   LDL Chol Calc (NIH)  Date Value Ref Range Status  05/20/2021 97 0 - 99 mg/dL Final   HDL  Date Value Ref Range Status  05/20/2021 38 (L) >39 mg/dL Final   Triglycerides  Date Value Ref Range Status  05/20/2021 68 0 - 149 mg/dL Final         Passed - Patient is not pregnant      Passed - Valid encounter within last 12 months    Recent Outpatient Visits           5 months ago Type 2 diabetes mellitus with hyperglycemia, with long-term current use of insulin  (HCC)   [place] [facility] - A Dept Of [place]. [facility] Vicci Sober B, MD   1 year ago Type 2 diabetes mellitus with hyperglycemia, with long-term current use of insulin  [facility])   [place] [facility] - A Dept Of [place]. [facility] Theotis Ohm W, NP   1 year ago Type 2 diabetes mellitus with hyperglycemia, with long-term current use of insulin  [facility])   [place] [facility] - A Dept Of [place]. [facility] [place], [place], [place]   2 years ago Type 2 diabetes mellitus with hyperglycemia, with long-term current use of insulin  [facility])   [place] [facility] - A Dept Of [place]. [facility] [place], [place] W, NP   2 years ago Type 2 diabetes mellitus with hyperglycemia, with long-term current use of insulin  [facility])   [place] [facility] - A Dept Of [place]. [facility] [place], [place] W, NP               glipiZIDE  (GLUCOTROL ) 5 MG tablet 90  tablet 1    Sig: Take 0.5 tablets (2.5 mg total) by mouth 2 (two) times daily before a meal.     Endocrinology:  Diabetes - Sulfonylureas Failed - 02/14/2024  4:57 PM      Failed - HBA1C is between 0 and 7.9 and within 180 days    HbA1c, POC (controlled diabetic range)  Date Value Ref Range Status  09/05/2023 8.6 (A) 0.0 - 7.0 % Final         Failed - Cr in normal range and within 360 days    Creat  Date Value Ref Range Status  04/18/2015 0.84 0.50 - 1.10 mg/dL Final   Creatinine, Ser  Date Value Ref Range Status  11/29/2022 0.76 0.57 - 1.00 mg/dL Final   Creatinine, POC  Date Value Ref Range Status  07/15/2016 200 mg/dL Final   Creatinine, Urine  Date Value Ref Range Status  04/18/2015 193 20 - 320 mg/dL Final         Passed - Valid encounter within last 6 months    Recent Outpatient Visits           5 months ago Type 2 diabetes mellitus  with hyperglycemia, with long-term current use of insulin  [facility])   [place] [facility] - A Dept Of [place]. [facility] Vicci Sober B, MD   1 year ago Type 2 diabetes mellitus with hyperglycemia, with long-term current use of insulin  [facility])   [place] [facility] - A Dept Of [place]. [facility] [place], [place] W, NP   1 year ago Type 2 diabetes mellitus with hyperglycemia, with long-term current use of insulin  [facility])   [place] [facility] - A Dept Of [place]. [facility] [place], [place], [place]   2 years ago Type 2 diabetes mellitus with hyperglycemia, with long-term current use of insulin  [facility])   [place] [facility] - A Dept Of [place]. [facility] [place], [place] W, NP   2 years ago Type 2 diabetes mellitus with hyperglycemia, with long-term current use of insulin  [facility])   [place] [facility] - A Dept Of [place]. [facility] [place], [place], NP               propranolol  ER (INDERAL  LA) 160 MG SR capsule 30 capsule 0     Sig: Take 1 capsule (160 mg total) by mouth daily.     Cardiovascular:  Beta Blockers Failed - 02/14/2024  4:57 PM      Failed - Last BP in normal range    BP Readings from Last 1 Encounters:  09/05/23 (!) 153/83         Passed - Last Heart Rate in normal range    Pulse Readings from Last 1 Encounters:  09/05/23 80         Passed - Valid encounter within last 6 months    Recent Outpatient Visits           5 months ago Type 2 diabetes mellitus with hyperglycemia, with long-term current use of insulin  (HCC)   [place] [facility] - A Dept Of [place]. [facility] Vicci Sober B, MD   1 year ago Type 2 diabetes mellitus with hyperglycemia, with long-term current use of insulin  [facility])   [place] [facility] - A Dept Of [place]. [facility] [place], [place] W, NP   1 year ago Type 2 diabetes mellitus with hyperglycemia, with long-term current use of insulin  [facility])   [place] [facility] - A Dept Of [place]. [facility] [place], [place], [place]   2 years ago Type 2 diabetes mellitus with hyperglycemia, with long-term current use of insulin  [facility])   [place] [facility] - A Dept Of [place]. [facility] [place], [place] W, NP   2 years ago Type 2 diabetes mellitus with hyperglycemia, with long-term current use of insulin  [facility])    Comm Health Shelly - A Dept Of [place]. [facility] Theotis Haze ORN, [place]

## 2024-02-15 ENCOUNTER — Other Ambulatory Visit: Payer: Self-pay

## 2024-02-15 ENCOUNTER — Other Ambulatory Visit (HOSPITAL_COMMUNITY): Payer: Self-pay

## 2024-02-15 ENCOUNTER — Other Ambulatory Visit: Payer: Self-pay | Admitting: Family Medicine

## 2024-02-15 DIAGNOSIS — E785 Hyperlipidemia, unspecified: Secondary | ICD-10-CM

## 2024-02-20 ENCOUNTER — Ambulatory Visit: Payer: Self-pay | Admitting: Nurse Practitioner

## 2024-02-21 ENCOUNTER — Other Ambulatory Visit: Payer: Self-pay

## 2024-02-22 ENCOUNTER — Other Ambulatory Visit: Payer: Self-pay

## 2024-04-08 ENCOUNTER — Other Ambulatory Visit: Payer: Self-pay | Admitting: Family Medicine

## 2024-04-08 ENCOUNTER — Other Ambulatory Visit: Payer: Self-pay | Admitting: Internal Medicine

## 2024-04-08 ENCOUNTER — Other Ambulatory Visit: Payer: Self-pay | Admitting: Nurse Practitioner

## 2024-04-08 DIAGNOSIS — E785 Hyperlipidemia, unspecified: Secondary | ICD-10-CM

## 2024-04-08 DIAGNOSIS — I10 Essential (primary) hypertension: Secondary | ICD-10-CM

## 2024-04-08 DIAGNOSIS — E1165 Type 2 diabetes mellitus with hyperglycemia: Secondary | ICD-10-CM

## 2024-04-09 ENCOUNTER — Other Ambulatory Visit (HOSPITAL_COMMUNITY): Payer: Self-pay

## 2024-04-10 ENCOUNTER — Encounter: Payer: Self-pay | Admitting: Pharmacist

## 2024-04-10 ENCOUNTER — Other Ambulatory Visit: Payer: Self-pay

## 2024-04-11 ENCOUNTER — Other Ambulatory Visit: Payer: Self-pay | Admitting: Nurse Practitioner

## 2024-04-11 ENCOUNTER — Other Ambulatory Visit: Payer: Self-pay | Admitting: Internal Medicine

## 2024-04-11 ENCOUNTER — Other Ambulatory Visit: Payer: Self-pay | Admitting: Family Medicine

## 2024-04-11 DIAGNOSIS — Z794 Long term (current) use of insulin: Secondary | ICD-10-CM

## 2024-04-11 DIAGNOSIS — I10 Essential (primary) hypertension: Secondary | ICD-10-CM

## 2024-04-12 ENCOUNTER — Other Ambulatory Visit: Payer: Self-pay

## 2024-04-12 MED ORDER — GLIPIZIDE 5 MG PO TABS
2.5000 mg | ORAL_TABLET | Freq: Two times a day (BID) | ORAL | 0 refills | Status: AC
  Filled 2024-04-12: qty 30, 30d supply, fill #0

## 2024-04-12 MED ORDER — INSULIN GLARGINE 100 UNIT/ML SOLOSTAR PEN
20.0000 [IU] | PEN_INJECTOR | Freq: Every day | SUBCUTANEOUS | 0 refills | Status: AC
  Filled 2024-04-12: qty 3, 15d supply, fill #0

## 2024-04-12 MED ORDER — PROPRANOLOL HCL ER 160 MG PO CP24
160.0000 mg | ORAL_CAPSULE | Freq: Every day | ORAL | 0 refills | Status: AC
  Filled 2024-04-12: qty 30, 30d supply, fill #0

## 2024-04-12 NOTE — Telephone Encounter (Signed)
 Requested Prescriptions  Pending Prescriptions Disp Refills   insulin  glargine (LANTUS ) 100 UNIT/ML Solostar Pen 3 mL 0    Sig: Inject 20 Units into the skin at bedtime.     Endocrinology:  Diabetes - Insulins Failed - 04/12/2024 10:20 AM      Failed - HBA1C is between 0 and 7.9 and within 180 days    HbA1c, POC (controlled diabetic range)  Date Value Ref Range Status  09/05/2023 8.6 (A) 0.0 - 7.0 % Final         Failed - Valid encounter within last 6 months    Recent Outpatient Visits           7 months ago Type 2 diabetes mellitus with hyperglycemia, with long-term current use of insulin  [facility])   [place] [facility] - A Dept Of [place]. [facility] Vicci Sober B, MD   1 year ago Type 2 diabetes mellitus with hyperglycemia, with long-term current use of insulin  [facility])   [place] [facility] - A Dept Of [place]. [facility] [place], [place] W, NP   1 year ago Type 2 diabetes mellitus with hyperglycemia, with long-term current use of insulin  [facility])   [place] [facility] - A Dept Of [place]. [facility] [place], [place], [place]   2 years ago Type 2 diabetes mellitus with hyperglycemia, with long-term current use of insulin  [facility])   [place] [facility] Shel[facility] - A Dept Of [place]. [facility] [place], [place] W, NP   2 years ago Type 2 diabetes mellitus with hyperglycemia, with long-term current use of insulin  [facility])   [place] [facility] - A Dept Of [place]. [facility] Theotis Haze ORN, [place]

## 2024-04-12 NOTE — Telephone Encounter (Signed)
 Requested medication (s) are due for refill today: yes  Requested medication (s) are on the active medication list: yes  Last refill:  02/01/24  Future visit scheduled: yes 05/01/24  Notes to clinic:  Unable to refill per protocol, courtesy refill already given, routing for provider approval.      Requested Prescriptions  Pending Prescriptions Disp Refills   glipiZIDE  (GLUCOTROL ) 5 MG tablet 30 tablet 0    Sig: Take 0.5 tablets (2.5 mg total) by mouth 2 (two) times daily before a meal.     Endocrinology:  Diabetes - Sulfonylureas Failed - 04/12/2024 10:58 AM      Failed - HBA1C is between 0 and 7.9 and within 180 days    HbA1c, POC (controlled diabetic range)  Date Value Ref Range Status  09/05/2023 8.6 (A) 0.0 - 7.0 % Final         Failed - Cr in normal range and within 360 days    Creat  Date Value Ref Range Status  04/18/2015 0.84 0.50 - 1.10 mg/dL Final   Creatinine, Ser  Date Value Ref Range Status  11/29/2022 0.76 0.57 - 1.00 mg/dL Final   Creatinine, POC  Date Value Ref Range Status  07/15/2016 200 mg/dL Final   Creatinine, Urine  Date Value Ref Range Status  04/18/2015 193 20 - 320 mg/dL Final         Failed - Valid encounter within last 6 months    Recent Outpatient Visits           7 months ago Type 2 diabetes mellitus with hyperglycemia, with long-term current use of insulin  (HCC)   [place] [facility] - A Dept Of [place]. [facility] Vicci Sober B, MD   1 year ago Type 2 diabetes mellitus with hyperglycemia, with long-term current use of insulin  [facility])   [place] [facility] - A Dept Of [place]. [facility] [place], [place] W, NP   1 year ago Type 2 diabetes mellitus with hyperglycemia, with long-term current use of insulin  [facility])   [place] [facility] - A Dept Of [place]. [facility] [place], [place], [place]   2 years ago Type 2 diabetes mellitus with hyperglycemia, with long-term  current use of insulin  [facility])   [place] [facility] - A Dept Of [place]. [facility] [place], [place] W, NP   2 years ago Type 2 diabetes mellitus with hyperglycemia, with long-term current use of insulin  [facility])   [place] [facility] - A Dept Of [place]. [facility] Theotis Haze ORN, [place]

## 2024-04-12 NOTE — Telephone Encounter (Signed)
 Requested Prescriptions  Pending Prescriptions Disp Refills   propranolol  ER (INDERAL  LA) 160 MG SR capsule 30 capsule 0    Sig: Take 1 capsule (160 mg total) by mouth daily.     Cardiovascular:  Beta Blockers Failed - 04/12/2024 10:22 AM      Failed - Last BP in normal range    BP Readings from Last 1 Encounters:  09/05/23 (!) 153/83         Failed - Valid encounter within last 6 months    Recent Outpatient Visits           7 months ago Type 2 diabetes mellitus with hyperglycemia, with long-term current use of insulin  (HCC)   [place] [facility] - A Dept Of [place]. [facility] Vicci Sober B, MD   1 year ago Type 2 diabetes mellitus with hyperglycemia, with long-term current use of insulin  [facility])   [place] [facility] - A Dept Of [place]. [facility] [place], [place] W, NP   1 year ago Type 2 diabetes mellitus with hyperglycemia, with long-term current use of insulin  [facility])   [place] [facility] - A Dept Of [place]. [facility] [place], [place], [place]   2 years ago Type 2 diabetes mellitus with hyperglycemia, with long-term current use of insulin  [facility])   [place] [facility] - A Dept Of [place]. [facility] [place], [place] W, NP   2 years ago Type 2 diabetes mellitus with hyperglycemia, with long-term current use of insulin  [facility])   [place] [facility] - A Dept Of [place]. [facility] [place], [place] W, NP              Passed - Last Heart Rate in normal range    Pulse Readings from Last 1 Encounters:  09/05/23 80

## 2024-04-13 ENCOUNTER — Other Ambulatory Visit: Payer: Self-pay

## 2024-04-15 ENCOUNTER — Other Ambulatory Visit (HOSPITAL_COMMUNITY): Payer: Self-pay

## 2024-04-15 ENCOUNTER — Other Ambulatory Visit: Payer: Self-pay

## 2024-04-16 ENCOUNTER — Other Ambulatory Visit: Payer: Self-pay

## 2024-04-17 ENCOUNTER — Ambulatory Visit: Payer: Self-pay | Admitting: Obstetrics and Gynecology

## 2024-04-17 ENCOUNTER — Other Ambulatory Visit: Payer: Self-pay

## 2024-04-17 ENCOUNTER — Encounter: Payer: Self-pay | Admitting: Pharmacist

## 2024-04-20 ENCOUNTER — Other Ambulatory Visit: Payer: Self-pay

## 2024-05-01 ENCOUNTER — Ambulatory Visit: Payer: Self-pay | Admitting: Nurse Practitioner

## 2024-05-31 ENCOUNTER — Ambulatory Visit: Payer: Self-pay | Admitting: Obstetrics and Gynecology
